# Patient Record
Sex: Male | Born: 1960 | Race: White | Hispanic: No | Marital: Single | Smoking: Former smoker
Health system: Southern US, Community
[De-identification: ages and names within clinical notes are randomized; demographics above are authoritative.]

## PROBLEM LIST (undated history)

## (undated) DIAGNOSIS — F32A Depression, unspecified: Secondary | ICD-10-CM

## (undated) DIAGNOSIS — E785 Hyperlipidemia, unspecified: Secondary | ICD-10-CM

## (undated) DIAGNOSIS — M1990 Unspecified osteoarthritis, unspecified site: Secondary | ICD-10-CM

## (undated) DIAGNOSIS — G4733 Obstructive sleep apnea (adult) (pediatric): Secondary | ICD-10-CM

## (undated) DIAGNOSIS — R51 Headache: Secondary | ICD-10-CM

## (undated) DIAGNOSIS — I10 Essential (primary) hypertension: Secondary | ICD-10-CM

## (undated) DIAGNOSIS — I639 Cerebral infarction, unspecified: Secondary | ICD-10-CM

## (undated) DIAGNOSIS — M109 Gout, unspecified: Secondary | ICD-10-CM

## (undated) DIAGNOSIS — K5900 Constipation, unspecified: Secondary | ICD-10-CM

## (undated) DIAGNOSIS — R519 Headache, unspecified: Secondary | ICD-10-CM

## (undated) DIAGNOSIS — E119 Type 2 diabetes mellitus without complications: Secondary | ICD-10-CM

## (undated) DIAGNOSIS — F329 Major depressive disorder, single episode, unspecified: Secondary | ICD-10-CM

## (undated) DIAGNOSIS — R0602 Shortness of breath: Secondary | ICD-10-CM

## (undated) HISTORY — DX: Unspecified osteoarthritis, unspecified site: M19.90

## (undated) HISTORY — DX: Major depressive disorder, single episode, unspecified: F32.9

## (undated) HISTORY — DX: Headache, unspecified: R51.9

## (undated) HISTORY — DX: Constipation, unspecified: K59.00

## (undated) HISTORY — DX: Type 2 diabetes mellitus without complications: E11.9

## (undated) HISTORY — DX: Shortness of breath: R06.02

## (undated) HISTORY — DX: Obstructive sleep apnea (adult) (pediatric): G47.33

## (undated) HISTORY — DX: Headache: R51

## (undated) HISTORY — DX: Hyperlipidemia, unspecified: E78.5

## (undated) HISTORY — DX: Depression, unspecified: F32.A

## (undated) HISTORY — DX: Cerebral infarction, unspecified: I63.9

## (undated) HISTORY — DX: Essential (primary) hypertension: I10

## (undated) HISTORY — PX: TONSILLECTOMY: SUR1361

---

## 2004-12-17 ENCOUNTER — Emergency Department: Payer: Self-pay | Admitting: Emergency Medicine

## 2005-09-11 HISTORY — PX: BACK SURGERY: SHX140

## 2007-09-08 ENCOUNTER — Other Ambulatory Visit: Payer: Self-pay

## 2007-09-09 ENCOUNTER — Inpatient Hospital Stay: Payer: Self-pay | Admitting: Internal Medicine

## 2010-06-29 ENCOUNTER — Emergency Department: Payer: Self-pay | Admitting: Emergency Medicine

## 2010-12-25 ENCOUNTER — Emergency Department: Payer: Self-pay | Admitting: Emergency Medicine

## 2012-04-24 ENCOUNTER — Emergency Department: Payer: Self-pay | Admitting: Emergency Medicine

## 2012-04-24 LAB — COMPREHENSIVE METABOLIC PANEL
Alkaline Phosphatase: 74 U/L (ref 50–136)
Calcium, Total: 8.9 mg/dL (ref 8.5–10.1)
Chloride: 103 mmol/L (ref 98–107)
Co2: 28 mmol/L (ref 21–32)
EGFR (African American): >60
EGFR (Non-African Amer.): >60
Potassium: 3.6 mmol/L (ref 3.5–5.1)
SGOT(AST): 39 U/L — ABNORMAL HIGH (ref 15–37)
SGPT (ALT): 54 U/L (ref 12–78)
Sodium: 138 mmol/L (ref 136–145)

## 2012-04-24 LAB — CBC
HCT: 38.2 % — ABNORMAL LOW (ref 40.0–52.0)
MCH: 30.8 pg (ref 26.0–34.0)
MCHC: 35.8 g/dL (ref 32.0–36.0)
MCV: 86 fL (ref 80–100)
Platelet: 216 10*3/uL (ref 150–440)
RDW: 13.8 % (ref 11.5–14.5)

## 2012-08-29 ENCOUNTER — Emergency Department: Payer: Self-pay | Admitting: Emergency Medicine

## 2012-08-31 ENCOUNTER — Emergency Department: Payer: Self-pay | Admitting: Emergency Medicine

## 2012-09-02 ENCOUNTER — Emergency Department: Payer: Self-pay | Admitting: Emergency Medicine

## 2013-01-05 ENCOUNTER — Emergency Department: Payer: Self-pay | Admitting: Internal Medicine

## 2013-01-05 LAB — CBC
HCT: 40.4 % (ref 40.0–52.0)
MCH: 29.4 pg (ref 26.0–34.0)
MCHC: 34.2 g/dL (ref 32.0–36.0)
Platelet: 213 10*3/uL (ref 150–440)
RBC: 4.69 10*6/uL (ref 4.40–5.90)

## 2013-01-05 LAB — BASIC METABOLIC PANEL
Anion Gap: 6 — ABNORMAL LOW (ref 7–16)
Calcium, Total: 9.1 mg/dL (ref 8.5–10.1)
Chloride: 105 mmol/L (ref 98–107)
Co2: 28 mmol/L (ref 21–32)
Creatinine: 1.08 mg/dL (ref 0.60–1.30)
EGFR (Non-African Amer.): >60
Glucose: 103 mg/dL — ABNORMAL HIGH (ref 65–99)
Osmolality: 280 (ref 275–301)
Sodium: 139 mmol/L (ref 136–145)

## 2013-01-05 LAB — CK TOTAL AND CKMB (NOT AT ARMC)
CK, Total: 399 U/L — ABNORMAL HIGH (ref 35–232)
CK-MB: 6.5 ng/mL — ABNORMAL HIGH (ref 0.5–3.6)

## 2013-01-05 LAB — TROPONIN I: Troponin-I: <0.02 ng/mL

## 2013-01-19 ENCOUNTER — Emergency Department: Payer: Self-pay | Admitting: Emergency Medicine

## 2013-01-19 LAB — CBC
HCT: 39.4 % — ABNORMAL LOW (ref 40.0–52.0)
MCH: 29.4 pg (ref 26.0–34.0)
MCHC: 35 g/dL (ref 32.0–36.0)
Platelet: 254 10*3/uL (ref 150–440)
RBC: 4.69 10*6/uL (ref 4.40–5.90)
RDW: 13.6 % (ref 11.5–14.5)
WBC: 9.8 10*3/uL (ref 3.8–10.6)

## 2013-01-19 LAB — COMPREHENSIVE METABOLIC PANEL
Albumin: 3.9 g/dL (ref 3.4–5.0)
Anion Gap: 5 — ABNORMAL LOW (ref 7–16)
BUN: 18 mg/dL (ref 7–18)
Bilirubin,Total: 0.8 mg/dL (ref 0.2–1.0)
Chloride: 102 mmol/L (ref 98–107)
Co2: 28 mmol/L (ref 21–32)
Creatinine: 0.92 mg/dL (ref 0.60–1.30)
EGFR (African American): >60
Osmolality: 274 (ref 275–301)
Potassium: 4 mmol/L (ref 3.5–5.1)
SGOT(AST): 42 U/L — ABNORMAL HIGH (ref 15–37)
SGPT (ALT): 35 U/L (ref 12–78)
Sodium: 135 mmol/L — ABNORMAL LOW (ref 136–145)
Total Protein: 8.6 g/dL — ABNORMAL HIGH (ref 6.4–8.2)

## 2013-01-19 LAB — URINALYSIS, COMPLETE
Bilirubin,UR: NEGATIVE
Blood: NEGATIVE
Glucose,UR: NEGATIVE mg/dL (ref 0–75)
Leukocyte Esterase: NEGATIVE
RBC,UR: 2 /HPF (ref 0–5)
Specific Gravity: 1.019 (ref 1.003–1.030)
Squamous Epithelial: <1

## 2013-03-26 ENCOUNTER — Emergency Department: Payer: Self-pay | Admitting: Emergency Medicine

## 2013-08-15 ENCOUNTER — Emergency Department: Payer: Self-pay | Admitting: Emergency Medicine

## 2013-08-15 LAB — COMPREHENSIVE METABOLIC PANEL
Alkaline Phosphatase: 84 U/L
Anion Gap: 4 — ABNORMAL LOW (ref 7–16)
Bilirubin,Total: 0.6 mg/dL (ref 0.2–1.0)
Creatinine: 1.07 mg/dL (ref 0.60–1.30)
EGFR (Non-African Amer.): >60
Osmolality: 281 (ref 275–301)
Potassium: 3.8 mmol/L (ref 3.5–5.1)
SGPT (ALT): 51 U/L (ref 12–78)
Total Protein: 7.9 g/dL (ref 6.4–8.2)

## 2013-08-15 LAB — CBC
HGB: 13.4 g/dL (ref 13.0–18.0)
MCH: 28.5 pg (ref 26.0–34.0)
MCHC: 33.4 g/dL (ref 32.0–36.0)
WBC: 10.3 10*3/uL (ref 3.8–10.6)

## 2013-08-28 ENCOUNTER — Emergency Department: Payer: Self-pay | Admitting: Emergency Medicine

## 2013-08-28 LAB — CBC WITH DIFFERENTIAL/PLATELET
Basophil #: 0.1 10*3/uL (ref 0.0–0.1)
Eosinophil #: 0.1 10*3/uL (ref 0.0–0.7)
Eosinophil %: 1.2 %
HCT: 43.8 % (ref 40.0–52.0)
HGB: 14.9 g/dL (ref 13.0–18.0)
Lymphocyte %: 29.3 %
MCH: 29 pg (ref 26.0–34.0)
MCHC: 33.9 g/dL (ref 32.0–36.0)
MCV: 85 fL (ref 80–100)
Neutrophil #: 6.2 10*3/uL (ref 1.4–6.5)
Platelet: 250 10*3/uL (ref 150–440)
RBC: 5.13 10*6/uL (ref 4.40–5.90)
WBC: 10.2 10*3/uL (ref 3.8–10.6)

## 2013-08-28 LAB — BASIC METABOLIC PANEL
BUN: 12 mg/dL (ref 7–18)
Calcium, Total: 9.3 mg/dL (ref 8.5–10.1)
Co2: 25 mmol/L (ref 21–32)
Creatinine: 1.19 mg/dL (ref 0.60–1.30)
EGFR (African American): >60
EGFR (Non-African Amer.): >60
Glucose: 269 mg/dL — ABNORMAL HIGH (ref 65–99)
Osmolality: 274 (ref 275–301)
Potassium: 3.8 mmol/L (ref 3.5–5.1)
Sodium: 132 mmol/L — ABNORMAL LOW (ref 136–145)

## 2013-10-30 ENCOUNTER — Emergency Department: Payer: Self-pay | Admitting: Emergency Medicine

## 2013-10-30 LAB — COMPREHENSIVE METABOLIC PANEL
Albumin: 3.2 g/dL — ABNORMAL LOW (ref 3.4–5.0)
Alkaline Phosphatase: 75 U/L
Anion Gap: 2 — ABNORMAL LOW (ref 7–16)
BILIRUBIN TOTAL: 0.8 mg/dL (ref 0.2–1.0)
BUN: 12 mg/dL (ref 7–18)
CALCIUM: 8.8 mg/dL (ref 8.5–10.1)
CO2: 27 mmol/L (ref 21–32)
CREATININE: 1.01 mg/dL (ref 0.60–1.30)
Chloride: 107 mmol/L (ref 98–107)
EGFR (African American): >60
EGFR (Non-African Amer.): >60
GLUCOSE: 139 mg/dL — AB (ref 65–99)
Osmolality: 274 (ref 275–301)
POTASSIUM: 3.9 mmol/L (ref 3.5–5.1)
SGOT(AST): 33 U/L (ref 15–37)
SGPT (ALT): 45 U/L (ref 12–78)
SODIUM: 136 mmol/L (ref 136–145)
Total Protein: 7.1 g/dL (ref 6.4–8.2)

## 2013-10-30 LAB — CBC
HCT: 39 % — ABNORMAL LOW (ref 40.0–52.0)
HGB: 13.6 g/dL (ref 13.0–18.0)
MCH: 29.9 pg (ref 26.0–34.0)
MCHC: 35 g/dL (ref 32.0–36.0)
MCV: 86 fL (ref 80–100)
Platelet: 203 10*3/uL (ref 150–440)
RBC: 4.56 10*6/uL (ref 4.40–5.90)
RDW: 14.1 % (ref 11.5–14.5)
WBC: 9.4 10*3/uL (ref 3.8–10.6)

## 2013-10-30 LAB — TROPONIN I: Troponin-I: <0.02 ng/mL

## 2014-02-05 ENCOUNTER — Emergency Department: Payer: Self-pay | Admitting: Emergency Medicine

## 2014-02-05 LAB — BASIC METABOLIC PANEL
ANION GAP: 3 — AB (ref 7–16)
BUN: 13 mg/dL (ref 7–18)
CHLORIDE: 103 mmol/L (ref 98–107)
CREATININE: 1.21 mg/dL (ref 0.60–1.30)
Calcium, Total: 9 mg/dL (ref 8.5–10.1)
Co2: 30 mmol/L (ref 21–32)
EGFR (African American): >60
Glucose: 304 mg/dL — ABNORMAL HIGH (ref 65–99)
OSMOLALITY: 283 (ref 275–301)
POTASSIUM: 3.8 mmol/L (ref 3.5–5.1)
Sodium: 136 mmol/L (ref 136–145)

## 2014-02-05 LAB — URINALYSIS, COMPLETE
BACTERIA: NONE SEEN
BILIRUBIN, UR: NEGATIVE
Blood: NEGATIVE
Glucose,UR: >=500 mg/dL (ref 0–75)
Ketone: NEGATIVE
Leukocyte Esterase: NEGATIVE
Nitrite: NEGATIVE
Ph: 6 (ref 4.5–8.0)
Protein: NEGATIVE
RBC,UR: <1 /HPF (ref 0–5)
Specific Gravity: 1.021 (ref 1.003–1.030)
WBC UR: <1 /HPF (ref 0–5)

## 2014-02-05 LAB — TROPONIN I

## 2014-02-05 LAB — CBC WITH DIFFERENTIAL/PLATELET
BASOS ABS: 0.1 10*3/uL (ref 0.0–0.1)
BASOS PCT: 1.1 %
Eosinophil #: 0.2 10*3/uL (ref 0.0–0.7)
Eosinophil %: 3.3 %
HCT: 43.7 % (ref 40.0–52.0)
HGB: 14.8 g/dL (ref 13.0–18.0)
Lymphocyte #: 2.4 10*3/uL (ref 1.0–3.6)
Lymphocyte %: 33.3 %
MCH: 29.8 pg (ref 26.0–34.0)
MCHC: 33.9 g/dL (ref 32.0–36.0)
MCV: 88 fL (ref 80–100)
MONO ABS: 0.7 x10 3/mm (ref 0.2–1.0)
Monocyte %: 10.1 %
NEUTROS PCT: 52.2 %
Neutrophil #: 3.7 10*3/uL (ref 1.4–6.5)
PLATELETS: 198 10*3/uL (ref 150–440)
RBC: 4.97 10*6/uL (ref 4.40–5.90)
RDW: 13.7 % (ref 11.5–14.5)
WBC: 7.2 10*3/uL (ref 3.8–10.6)

## 2014-06-15 ENCOUNTER — Ambulatory Visit: Payer: Self-pay | Admitting: Family Medicine

## 2014-07-12 ENCOUNTER — Ambulatory Visit: Payer: Self-pay | Admitting: Family Medicine

## 2014-08-20 ENCOUNTER — Emergency Department: Payer: Self-pay | Admitting: Emergency Medicine

## 2014-08-20 LAB — BASIC METABOLIC PANEL
Anion Gap: 8 (ref 7–16)
BUN: 14 mg/dL (ref 7–18)
CALCIUM: 8.7 mg/dL (ref 8.5–10.1)
Chloride: 100 mmol/L (ref 98–107)
Co2: 29 mmol/L (ref 21–32)
Creatinine: 1.13 mg/dL (ref 0.60–1.30)
EGFR (African American): >60
EGFR (Non-African Amer.): >60
Glucose: 270 mg/dL — ABNORMAL HIGH (ref 65–99)
Osmolality: 284 (ref 275–301)
POTASSIUM: 3.8 mmol/L (ref 3.5–5.1)
SODIUM: 137 mmol/L (ref 136–145)

## 2014-08-20 LAB — CBC
HCT: 41.2 % (ref 40.0–52.0)
HGB: 13.8 g/dL (ref 13.0–18.0)
MCH: 29.6 pg (ref 26.0–34.0)
MCHC: 33.5 g/dL (ref 32.0–36.0)
MCV: 89 fL (ref 80–100)
Platelet: 215 10*3/uL (ref 150–440)
RBC: 4.65 10*6/uL (ref 4.40–5.90)
RDW: 13.7 % (ref 11.5–14.5)
WBC: 10.5 10*3/uL (ref 3.8–10.6)

## 2014-08-20 LAB — TROPONIN I: Troponin-I: <0.02 ng/mL

## 2014-12-14 ENCOUNTER — Encounter: Payer: Self-pay | Admitting: Family Medicine

## 2014-12-15 ENCOUNTER — Encounter: Payer: Self-pay | Admitting: Family Medicine

## 2014-12-15 DIAGNOSIS — E1149 Type 2 diabetes mellitus with other diabetic neurological complication: Secondary | ICD-10-CM | POA: Insufficient documentation

## 2014-12-15 DIAGNOSIS — E1165 Type 2 diabetes mellitus with hyperglycemia: Secondary | ICD-10-CM | POA: Insufficient documentation

## 2014-12-15 DIAGNOSIS — E1159 Type 2 diabetes mellitus with other circulatory complications: Secondary | ICD-10-CM | POA: Insufficient documentation

## 2014-12-15 DIAGNOSIS — I152 Hypertension secondary to endocrine disorders: Secondary | ICD-10-CM | POA: Insufficient documentation

## 2014-12-15 DIAGNOSIS — R0681 Apnea, not elsewhere classified: Secondary | ICD-10-CM | POA: Insufficient documentation

## 2014-12-15 DIAGNOSIS — E118 Type 2 diabetes mellitus with unspecified complications: Secondary | ICD-10-CM

## 2014-12-15 DIAGNOSIS — I10 Essential (primary) hypertension: Secondary | ICD-10-CM | POA: Insufficient documentation

## 2015-01-06 ENCOUNTER — Emergency Department
Admit: 2015-01-06 | Disposition: A | Discharge status: Home / Self Care | Payer: Self-pay | Admitting: Emergency Medicine

## 2015-01-06 LAB — CBC
HCT: 41.6 % (ref 40.0–52.0)
HGB: 13.9 g/dL (ref 13.0–18.0)
MCH: 29.2 pg (ref 26.0–34.0)
MCHC: 33.4 g/dL (ref 32.0–36.0)
MCV: 88 fL (ref 80–100)
Platelet: 226 10*3/uL (ref 150–440)
RBC: 4.75 10*6/uL (ref 4.40–5.90)
RDW: 13.9 % (ref 11.5–14.5)
WBC: 9 10*3/uL (ref 3.8–10.6)

## 2015-01-06 LAB — COMPREHENSIVE METABOLIC PANEL
ALT: 35 U/L
Albumin: 4.3 g/dL
Alkaline Phosphatase: 55 U/L
Anion Gap: 7 (ref 7–16)
BILIRUBIN TOTAL: 0.8 mg/dL
BUN: 20 mg/dL
CREATININE: 1.09 mg/dL
Calcium, Total: 9.4 mg/dL
Chloride: 104 mmol/L
Co2: 30 mmol/L
EGFR (African American): >60
EGFR (Non-African Amer.): >60
GLUCOSE: 115 mg/dL — AB
POTASSIUM: 4 mmol/L
SGOT(AST): 30 U/L
Sodium: 141 mmol/L
TOTAL PROTEIN: 7.7 g/dL

## 2015-01-06 LAB — TROPONIN I: Troponin-I: <0.03 ng/mL

## 2015-01-06 LAB — PROTIME-INR
INR: 1
Prothrombin Time: 13.4 secs

## 2015-01-06 LAB — ETHANOL: Ethanol: <5 mg/dL

## 2015-02-18 ENCOUNTER — Encounter (INDEPENDENT_AMBULATORY_CARE_PROVIDER_SITE_OTHER): Payer: Self-pay

## 2015-02-18 ENCOUNTER — Encounter: Payer: Self-pay | Admitting: Family Medicine

## 2015-02-18 ENCOUNTER — Ambulatory Visit (INDEPENDENT_AMBULATORY_CARE_PROVIDER_SITE_OTHER): Payer: Self-pay | Admitting: Family Medicine

## 2015-02-18 VITALS — BP 120/80 | HR 90 | Resp 16 | Ht 72.0 in | Wt 330.3 lb

## 2015-02-18 DIAGNOSIS — E785 Hyperlipidemia, unspecified: Secondary | ICD-10-CM

## 2015-02-18 DIAGNOSIS — E668 Other obesity: Secondary | ICD-10-CM

## 2015-02-18 DIAGNOSIS — E1169 Type 2 diabetes mellitus with other specified complication: Secondary | ICD-10-CM

## 2015-02-18 DIAGNOSIS — E1143 Type 2 diabetes mellitus with diabetic autonomic (poly)neuropathy: Secondary | ICD-10-CM | POA: Insufficient documentation

## 2015-02-18 DIAGNOSIS — I10 Essential (primary) hypertension: Secondary | ICD-10-CM

## 2015-02-18 DIAGNOSIS — G629 Polyneuropathy, unspecified: Secondary | ICD-10-CM

## 2015-02-18 DIAGNOSIS — E669 Obesity, unspecified: Secondary | ICD-10-CM

## 2015-02-18 DIAGNOSIS — E118 Type 2 diabetes mellitus with unspecified complications: Secondary | ICD-10-CM

## 2015-02-18 MED ORDER — LISINOPRIL-HYDROCHLOROTHIAZIDE 10-12.5 MG PO TABS
1.0000 | ORAL_TABLET | Freq: Every day | ORAL | Status: DC
Start: 2015-02-18 — End: 2015-08-09

## 2015-02-18 MED ORDER — METFORMIN HCL 1000 MG PO TABS: 1000.0000 mg | ORAL_TABLET | Freq: Two times a day (BID) | ORAL | Status: DC

## 2015-02-18 MED ORDER — PRAVASTATIN SODIUM 40 MG PO TABS: 40.0000 mg | ORAL_TABLET | Freq: Every day | ORAL | Status: DC

## 2015-02-18 NOTE — Assessment & Plan Note (Deleted)
Patient will improve his dietary lifestyle factors for optimal control of diabetes. He has stopped taking gabapentin because it was not effective. We will discuss starting patient on a different agent at his next visit.

## 2015-02-18 NOTE — Progress Notes (Signed)
Name: Jamie Finley   MRN: [id number]    DOB: 11-27-1960   Date:02/18/2015       Progress Note  Subjective  Chief Complaint  Chief Complaint  Patient presents with  . Diabetes    3 month follow up  . Hyperlipidemia  . Hypertension    Diabetes He presents for his follow-up diabetic visit. He has type 2 diabetes mellitus. Pertinent negatives for hypoglycemia include no headaches. Associated symptoms include visual change. Pertinent negatives for diabetes include no blurred vision, no chest pain, no fatigue, no foot paresthesias, no polydipsia, no polyuria, no weakness and no weight loss. Hypoglycemia complications include nocturnal hypoglycemia. Symptoms are stable. Pertinent negatives for diabetic complications include no CVA. Risk factors for coronary artery disease include male sex, obesity and dyslipidemia. Current diabetic treatment includes oral agent (monotherapy). His weight is stable. He is following a generally healthy diet. When asked about meal planning, he reported none. He rarely participates in exercise. His breakfast blood glucose is taken between 7-8 am. His breakfast blood glucose range is generally 110-130 mg/dl. Eye exam is current.  Hyperlipidemia This is a chronic problem. Recent lipid tests were reviewed and are normal. Exacerbating diseases include diabetes and obesity. Pertinent negatives include no chest pain, focal sensory loss, myalgias or shortness of breath. Current antihyperlipidemic treatment includes statins. The current treatment provides significant improvement of lipids.  Hypertension This is a chronic problem. The problem is controlled. Pertinent negatives include no blurred vision, chest pain, headaches, malaise/fatigue or shortness of breath. Past treatments include ACE inhibitors and diuretics. The current treatment provides significant improvement. Compliance problems include diet.  There is no history of angina, kidney disease, CAD/MI or CVA.      Past  Medical History  Diagnosis Date  . Diabetes mellitus without complication   . Hypertension   . Hyperlipidemia    Past Surgical History  Procedure Laterality Date  . Back surgery  2007    [facility]   Family History  Problem Relation Age of Onset  . Cancer Mother     face and jaw  . Cancer Father     kidney     History  Substance Use Topics  . Smoking status: Former Smoker    Quit date: 09/11/1989  . Smokeless tobacco: Never Used  . Alcohol Use: No     Current outpatient prescriptions:  .  aspirin 81 MG tablet, Take 81 mg by mouth daily., Disp: , Rfl:  .  lisinopril-hydrochlorothiazide (PRINZIDE,ZESTORETIC) 10-12.5 MG per tablet, Take 1 tablet by mouth daily. , Disp: , Rfl:  .  pravastatin (PRAVACHOL) 40 MG tablet, Take 40 mg by mouth daily. , Disp: , Rfl:  .  [DISCONTINUED] metFORMIN (GLUCOPHAGE) 1000 MG tablet, Take 500 mg by mouth 2 (two) times daily with a meal. , Disp: , Rfl:  .  gabapentin (NEURONTIN) 300 MG capsule, Take by mouth., Disp: , Rfl:  .  glipiZIDE (GLUCOTROL XL) 5 MG 24 hr tablet, Take by mouth., Disp: , Rfl:   No Known Allergies  Review of Systems  Constitutional: Negative for weight loss, malaise/fatigue and fatigue.  Eyes: Negative for blurred vision.  Respiratory: Negative for shortness of breath.   Cardiovascular: Negative for chest pain.  Musculoskeletal: Negative for myalgias.  Neurological: Negative for weakness and headaches.  Endo/Heme/Allergies: Negative for polydipsia.      Objective  Filed Vitals:   02/18/15 0822  BP: 120/80  Pulse: 90  Resp: 16  Height: 6' (1.829 m)  Weight: 330 lb 4.8  oz (149.823 kg)  SpO2: 96%     Physical Exam  Constitutional: He is well-developed, well-nourished, and in no distress.  HENT:  Head: Normocephalic and atraumatic.  Cardiovascular: Normal rate.   Pulmonary/Chest: Effort normal.  Abdominal: Soft. Bowel sounds are normal.  Neurological: He is alert.  Skin: Skin is warm and dry.   Psychiatric: Affect normal.  Nursing note and vitals reviewed.       Assessment & Plan   There are no diagnoses linked to this encounter.   Gwendoline Judy [facility] 02/18/2015 8:41 AM  1. Essential hypertension  - lisinopril-hydrochlorothiazide (PRINZIDE,ZESTORETIC) 10-12.5 MG per tablet; Take 1 tablet by mouth daily.  Dispense: 90 tablet; Refill: 0  2. Diabetes mellitus type 2, controlled, with complications And has stopped taking glipizide because of hypoglycemic episodes. We will review A1c and consider starting patient on a DPP 4 inhibitor. For now, he is going to continue on metformin. - metFORMIN (GLUCOPHAGE) 1000 MG tablet; Take 1 tablet (1,000 mg total) by mouth 2 (two) times daily with a meal.  Dispense: 180 tablet; Refill: 3 - HgB A1c  3. Hyperlipidemia associated with type 2 diabetes mellitus  - pravastatin (PRAVACHOL) 40 MG tablet; Take 1 tablet (40 mg total) by mouth daily.  Dispense: 90 tablet; Refill: 0 - Lipid Profile - Comprehensive metabolic panel  4. Extreme obesity Patient has been educated on dietary and lifestyle changes for weight loss. He is not interested in a nutritionist referral at this time. Follow-up in 3 months.  5. Peripheral neuropathy  Patient will improve his dietary lifestyle factors for optimal control of diabetes. He has stopped taking gabapentin because it was not effective. We will discuss starting patient on a different agent at his next visit.

## 2015-03-25 ENCOUNTER — Encounter: Payer: Self-pay | Admitting: Family Medicine

## 2015-05-20 ENCOUNTER — Ambulatory Visit: Payer: Self-pay | Admitting: Family Medicine

## 2015-08-04 ENCOUNTER — Telehealth: Payer: Self-pay | Admitting: Family Medicine

## 2015-08-04 NOTE — Telephone Encounter (Signed)
Patient needs an office visit appointment for blood pressure follow-up prior to refills.

## 2015-08-04 NOTE — Telephone Encounter (Signed)
NEEDS REFILL ON BP MEDS. PHARM IS [facility] ON [address]

## 2015-08-09 ENCOUNTER — Encounter: Payer: Self-pay | Admitting: Family Medicine

## 2015-08-09 ENCOUNTER — Ambulatory Visit (INDEPENDENT_AMBULATORY_CARE_PROVIDER_SITE_OTHER): Payer: Self-pay | Admitting: Family Medicine

## 2015-08-09 VITALS — BP 120/77 | HR 87 | Temp 98.7°F | Resp 18 | Ht 72.0 in | Wt 330.8 lb

## 2015-08-09 DIAGNOSIS — I10 Essential (primary) hypertension: Secondary | ICD-10-CM

## 2015-08-09 DIAGNOSIS — E118 Type 2 diabetes mellitus with unspecified complications: Secondary | ICD-10-CM

## 2015-08-09 DIAGNOSIS — E785 Hyperlipidemia, unspecified: Secondary | ICD-10-CM

## 2015-08-09 DIAGNOSIS — E1169 Type 2 diabetes mellitus with other specified complication: Secondary | ICD-10-CM

## 2015-08-09 MED ORDER — LISINOPRIL-HYDROCHLOROTHIAZIDE 10-12.5 MG PO TABS: 1.0000 | ORAL_TABLET | Freq: Every day | ORAL | Status: DC

## 2015-08-09 NOTE — Progress Notes (Signed)
Name: Jamie Finley   MRN: [id number]    DOB: 01/26/1961   Date:08/09/2015       Progress Note  Subjective  Chief Complaint  Chief Complaint  Patient presents with  . Medication Refill    lisinopril 10-12.5 mg / pravastatin 40mg    . Diabetes  . Hyperlipidemia    Diabetes He presents for his follow-up diabetic visit. He has type 2 diabetes mellitus. His disease course has been stable. There are no hypoglycemic associated symptoms. Pertinent negatives for hypoglycemia include no headaches. Pertinent negatives for diabetes include no blurred vision and no chest pain. Pertinent negatives for diabetic complications include no CVA. Current diabetic treatment includes oral agent (monotherapy). His breakfast blood glucose range is generally 140-180 mg/dl.  Hyperlipidemia This is a chronic problem. Exacerbating diseases include diabetes and obesity. Pertinent negatives include no chest pain, leg pain or shortness of breath. Current antihyperlipidemic treatment includes statins.  Hypertension This is a chronic problem. The problem is unchanged. The problem is controlled. Pertinent negatives include no blurred vision, chest pain, headaches, palpitations or shortness of breath. Past treatments include ACE inhibitors and diuretics. There is no history of kidney disease, CAD/MI or CVA.   Past Medical History  Diagnosis Date  . Diabetes mellitus without complication ([place])   . Hypertension   . Hyperlipidemia     Past Surgical History  Procedure Laterality Date  . Back surgery  2007    [facility]    Family History  Problem Relation Age of Onset  . Cancer Mother     face and jaw  . Cancer Father     kidney    Social History   Social History  . Marital Status: Single    Spouse Name: N/A  . Number of Children: N/A  . Years of Education: N/A   Occupational History  . Not on file.   Social History Main Topics  . Smoking status: Former Smoker    Quit date: 09/11/1989  . Smokeless  tobacco: Never Used  . Alcohol Use: No  . Drug Use: No  . Sexual Activity: Not on file   Other Topics Concern  . Not on file   Social History Narrative     Current outpatient prescriptions:  .  aspirin 81 MG tablet, Take 81 mg by mouth daily., Disp: , Rfl:  .  lisinopril-hydrochlorothiazide (PRINZIDE,ZESTORETIC) 10-12.5 MG per tablet, Take 1 tablet by mouth daily., Disp: 90 tablet, Rfl: 0 .  metFORMIN (GLUCOPHAGE) 1000 MG tablet, Take 1 tablet (1,000 mg total) by mouth 2 (two) times daily with a meal., Disp: 180 tablet, Rfl: 3 .  pravastatin (PRAVACHOL) 40 MG tablet, Take 1 tablet (40 mg total) by mouth daily. (Patient not taking: Reported on 08/09/2015), Disp: 90 tablet, Rfl: 0  No Known Allergies   Review of Systems  Eyes: Negative for blurred vision.  Respiratory: Negative for shortness of breath.   Cardiovascular: Negative for chest pain and palpitations.  Neurological: Negative for headaches.    Objective  Filed Vitals:   08/09/15 0820  BP: 120/77  Pulse: 87  Temp: 98.7 F (37.1 C)  TempSrc: Oral  Resp: 18  Height: 6' (1.829 m)  Weight: 330 lb 12.8 oz (150.05 kg)  SpO2: 96%    Physical Exam  Constitutional: He is oriented to person, place, and time and well-developed, well-nourished, and in no distress.  HENT:  Head: Normocephalic and atraumatic.  Cardiovascular: Normal rate and regular rhythm.   Pulmonary/Chest: Effort normal and breath sounds normal.  Musculoskeletal: He exhibits no edema.  Neurological: He is alert and oriented to person, place, and time.  Nursing note and vitals reviewed.    Assessment & Plan  1. Essential hypertension  - lisinopril-hydrochlorothiazide (PRINZIDE,ZESTORETIC) 10-12.5 MG tablet; Take 1 tablet by mouth daily.  Dispense: 90 tablet; Refill: 0  2. Controlled type 2 diabetes mellitus with complication, without long-term current use of insulin ([place]) We'll obtain A1c in office.  3. Hyperlipidemia associated with type 2  diabetes mellitus (H[facility]) Continue Pravastatin 40 mg at bedtime and repeat FLP in 3 months.   Jawanda Passey [facility] 08/09/2015 8:27 AM

## 2015-11-14 ENCOUNTER — Emergency Department
Admission: EM | Admit: 2015-11-14 | Discharge: 2015-11-15 | Disposition: A | Discharge status: Home / Self Care | Payer: Self-pay | Attending: Emergency Medicine | Admitting: Emergency Medicine

## 2015-11-14 ENCOUNTER — Emergency Department: Payer: Self-pay

## 2015-11-14 ENCOUNTER — Encounter: Payer: Self-pay | Admitting: *Deleted

## 2015-11-14 DIAGNOSIS — Z7982 Long term (current) use of aspirin: Secondary | ICD-10-CM | POA: Insufficient documentation

## 2015-11-14 DIAGNOSIS — J40 Bronchitis, not specified as acute or chronic: Secondary | ICD-10-CM

## 2015-11-14 DIAGNOSIS — E1169 Type 2 diabetes mellitus with other specified complication: Secondary | ICD-10-CM | POA: Insufficient documentation

## 2015-11-14 DIAGNOSIS — Z7984 Long term (current) use of oral hypoglycemic drugs: Secondary | ICD-10-CM | POA: Insufficient documentation

## 2015-11-14 DIAGNOSIS — E785 Hyperlipidemia, unspecified: Secondary | ICD-10-CM | POA: Insufficient documentation

## 2015-11-14 DIAGNOSIS — Z79899 Other long term (current) drug therapy: Secondary | ICD-10-CM | POA: Insufficient documentation

## 2015-11-14 DIAGNOSIS — Z87891 Personal history of nicotine dependence: Secondary | ICD-10-CM | POA: Insufficient documentation

## 2015-11-14 DIAGNOSIS — I10 Essential (primary) hypertension: Secondary | ICD-10-CM | POA: Insufficient documentation

## 2015-11-14 NOTE — ED Notes (Addendum)
Pt reports he has a cough for 5 days.   States coughing up clear/white phlegm.  Chills at night.   No sob.  No chest pain. Nonsmoker.  Pt alert.

## 2015-11-15 MED ORDER — PREDNISONE 20 MG PO TABS: 60.0000 mg | ORAL_TABLET | Freq: Every day | ORAL | Status: DC

## 2015-11-15 MED ORDER — IBUPROFEN 800 MG PO TABS
800.0000 mg | ORAL_TABLET | Freq: Once | ORAL | Status: AC
  Administered 2015-11-15: 800 mg via ORAL
  Filled 2015-11-15: qty 1

## 2015-11-15 MED ORDER — LISINOPRIL-HYDROCHLOROTHIAZIDE 10-12.5 MG PO TABS: 1.0000 | ORAL_TABLET | Freq: Every day | ORAL | Status: DC

## 2015-11-15 MED ORDER — IPRATROPIUM-ALBUTEROL 0.5-2.5 (3) MG/3ML IN SOLN
3.0000 mL | Freq: Once | RESPIRATORY_TRACT | Status: AC
  Administered 2015-11-15: 3 mL via RESPIRATORY_TRACT
  Filled 2015-11-15: qty 3

## 2015-11-15 MED ORDER — ALBUTEROL SULFATE HFA 108 (90 BASE) MCG/ACT IN AERS: 2.0000 | INHALATION_SPRAY | Freq: Four times a day (QID) | RESPIRATORY_TRACT | Status: DC | PRN

## 2015-11-15 MED ORDER — BENZONATATE 100 MG PO CAPS
100.0000 mg | ORAL_CAPSULE | Freq: Once | ORAL | Status: AC
  Administered 2015-11-15: 100 mg via ORAL
  Filled 2015-11-15: qty 1

## 2015-11-15 MED ORDER — PREDNISONE 20 MG PO TABS
60.0000 mg | ORAL_TABLET | Freq: Once | ORAL | Status: AC
  Administered 2015-11-15: 60 mg via ORAL
  Filled 2015-11-15: qty 3

## 2015-11-15 MED ORDER — METFORMIN HCL 1000 MG PO TABS: 1000.0000 mg | ORAL_TABLET | Freq: Two times a day (BID) | ORAL | Status: DC

## 2015-11-15 NOTE — Discharge Instructions (Signed)
Upper Respiratory Infection, Adult Most upper respiratory infections (URIs) are a viral infection of the air passages leading to the lungs. A URI affects the nose, throat, and upper air passages. The most common type of URI is nasopharyngitis and is typically referred to as "the common cold." URIs run their course and usually go away on their own. Most of the time, a URI does not require medical attention, but sometimes a bacterial infection in the upper airways can follow a viral infection. This is called a secondary infection. Sinus and middle ear infections are common types of secondary upper respiratory infections. Bacterial pneumonia can also complicate a URI. A URI can worsen asthma and chronic obstructive pulmonary disease (COPD). Sometimes, these complications can require emergency medical care and may be life threatening.  CAUSES Almost all URIs are caused by viruses. A virus is a type of germ and can spread from one person to another.  RISKS FACTORS You may be at risk for a URI if:   You smoke.   You have chronic heart or lung disease.  You have a weakened defense (immune) system.   You are very young or very old.   You have nasal allergies or asthma.  You work in crowded or poorly ventilated areas.  You work in health care facilities or schools. SIGNS AND SYMPTOMS  Symptoms typically develop 2-3 days after you come in contact with a cold virus. Most viral URIs last 7-10 days. However, viral URIs from the influenza virus (flu virus) can last 14-18 days and are typically more severe. Symptoms may include:   Runny or stuffy (congested) nose.   Sneezing.   Cough.   Sore throat.   Headache.   Fatigue.   Fever.   Loss of appetite.   Pain in your forehead, behind your eyes, and over your cheekbones (sinus pain).  Muscle aches.  DIAGNOSIS  Your health care provider may diagnose a URI by:  Physical exam.  Tests to check that your symptoms are not due to  another condition such as:  Strep throat.  Sinusitis.  Pneumonia.  Asthma. TREATMENT  A URI goes away on its own with time. It cannot be cured with medicines, but medicines may be prescribed or recommended to relieve symptoms. Medicines may help:  Reduce your fever.  Reduce your cough.  Relieve nasal congestion. HOME CARE INSTRUCTIONS   Take medicines only as directed by your health care provider.   Gargle warm saltwater or take cough drops to comfort your throat as directed by your health care provider.  Use a warm mist humidifier or inhale steam from a shower to increase air moisture. This may make it easier to breathe.  Drink enough fluid to keep your urine clear or pale yellow.   Eat soups and other clear broths and maintain good nutrition.   Rest as needed.   Return to work when your temperature has returned to normal or as your health care provider advises. You may need to stay home longer to avoid infecting others. You can also use a face mask and careful hand washing to prevent spread of the virus.  Increase the usage of your inhaler if you have asthma.   Do not use any tobacco products, including cigarettes, chewing tobacco, or electronic cigarettes. If you need help quitting, ask your health care provider. PREVENTION  The best way to protect yourself from getting a cold is to practice good hygiene.   Avoid oral or hand contact with people with cold   symptoms.   Wash your hands often if contact occurs.  There is no clear evidence that vitamin C, vitamin E, echinacea, or exercise reduces the chance of developing a cold. However, it is always recommended to get plenty of rest, exercise, and practice good nutrition.  SEEK MEDICAL CARE IF:   You are getting worse rather than better.   Your symptoms are not controlled by medicine.   You have chills.  You have worsening shortness of breath.  You have brown or red mucus.  You have yellow or brown nasal  discharge.  You have pain in your face, especially when you bend forward.  You have a fever.  You have swollen neck glands.  You have pain while swallowing.  You have white areas in the back of your throat. SEEK IMMEDIATE MEDICAL CARE IF:   You have severe or persistent:  Headache.  Ear pain.  Sinus pain.  Chest pain.  You have chronic lung disease and any of the following:  Wheezing.  Prolonged cough.  Coughing up blood.  A change in your usual mucus.  You have a stiff neck.  You have changes in your:  Vision.  Hearing.  Thinking.  Mood. MAKE SURE YOU:   Understand these instructions.  Will watch your condition.  Will get help right away if you are not doing well or get worse.   This information is not intended to replace advice given to you by your health care provider. Make sure you discuss any questions you have with your health care provider.   Document Released: 02/21/2001 Document Revised: 01/12/2015 Document Reviewed: 12/03/2013 Elsevier Interactive Patient Education 2016 [employer] Inc[employer]  

## 2015-11-15 NOTE — ED Provider Notes (Signed)
[facility] Emergency Department Provider Note  ____________________________________________  Time seen: Approximately 0050 AM  I have reviewed the triage vital signs and the nursing notes.   HISTORY  Chief Complaint Cough    HPI Jamie Finley is a 55 y.o. male who comes into the hospital today with congestion. The patient reports that he has been unable to eat and sleep. He feels like it is in his chest and is concerned he may have pneumonia. The patient has no fevers but reports that the symptoms started on Wednesday night. He's been taking vitamins and tests and but nothing has been working. The patient has a primary care physician but has not seen him due to lack of insurance. The patient denies any headache or blurred vision. He's had some shortness of breath with cough is productive of white sputum. He did have some nausea this evening but has not vomited. He also reports he has not been taking his diabetes medications. He came in to get evaluated and checked out for the symptoms. He denies any chest pain.   Past Medical History  Diagnosis Date  . Diabetes mellitus without complication ([place])   . Hypertension   . Hyperlipidemia     Patient Active Problem List   Diagnosis Date Noted  . Hyperlipidemia associated with type 2 diabetes mellitus ([place]) 02/18/2015  . Peripheral neuropathy ([place]) 02/18/2015  . Diabetes mellitus type 2, controlled, with complications ([place]) 99991111  . Diabetes ([place]) 12/15/2014  . Breathlessness on exertion 12/15/2014  . BP (high blood pressure) 12/15/2014  . Extreme obesity ([place]) 12/15/2014    Past Surgical History  Procedure Laterality Date  . Back surgery  2007    [facility]    Current Outpatient Rx  Name  Route  Sig  Dispense  Refill  . albuterol (PROVENTIL HFA;VENTOLIN HFA) 108 (90 Base) MCG/ACT inhaler   Inhalation   Inhale 2 puffs into the lungs every 6 (six) hours as needed.   1 Inhaler   0   . aspirin 81 MG  tablet   Oral   Take 81 mg by mouth daily.         Marland Kitchen lisinopril-hydrochlorothiazide (PRINZIDE,ZESTORETIC) 10-12.5 MG tablet   Oral   Take 1 tablet by mouth daily.   90 tablet   0   . lisinopril-hydrochlorothiazide (PRINZIDE,ZESTORETIC) 10-12.5 MG tablet   Oral   Take 1 tablet by mouth daily.   30 tablet   0   . metFORMIN (GLUCOPHAGE) 1000 MG tablet   Oral   Take 1 tablet (1,000 mg total) by mouth 2 (two) times daily with a meal.   180 tablet   3   . metFORMIN (GLUCOPHAGE) 1000 MG tablet   Oral   Take 1 tablet (1,000 mg total) by mouth 2 (two) times daily with a meal.   60 tablet   0   . pravastatin (PRAVACHOL) 40 MG tablet   Oral   Take 1 tablet (40 mg total) by mouth daily. Patient not taking: Reported on 08/09/2015   90 tablet   0   . predniSONE (DELTASONE) 20 MG tablet   Oral   Take 3 tablets (60 mg total) by mouth daily.   12 tablet   0     Allergies Review of patient's allergies indicates no known allergies.  Family History  Problem Relation Age of Onset  . Cancer Mother     face and jaw  . Cancer Father     kidney    Social  History Social History  Substance Use Topics  . Smoking status: Former Smoker    Quit date: 09/11/1989  . Smokeless tobacco: Never Used  . Alcohol Use: No    Review of Systems Constitutional: No fever/chills Eyes: No visual changes. ENT: No sore throat. Cardiovascular: Denies chest pain. Respiratory: Cough and shortness of breath. Gastrointestinal:  nausea, no vomiting.  No diarrhea.  No constipation. Genitourinary: Negative for dysuria. Musculoskeletal: Negative for back pain. Skin: Negative for rash. Neurological: Negative for headaches, focal weakness or numbness.  10-point ROS otherwise negative.  ____________________________________________   PHYSICAL EXAM:  VITAL SIGNS: ED Triage Vitals  Enc Vitals Group     BP 11/14/15 2218 115/57 mmHg     Pulse Rate 11/14/15 2218 90     Resp 11/14/15 2218 22      Temp 11/14/15 2218 98.2 F (36.8 C)     Temp Source 11/14/15 2218 Oral     SpO2 11/14/15 2218 95 %     Weight 11/14/15 2218 330 lb (149.687 kg)     Height 11/14/15 2218 6' (1.829 m)     Head Cir --      Peak Flow --      Pain Score 11/14/15 2220 0     Pain Loc --      Pain Edu? --      Excl. in [place]? --     Constitutional: Alert and oriented. Well appearing and in mild distress. Eyes: Conjunctivae are normal. PERRL. EOMI. Head: Atraumatic. Nose: No congestion/rhinnorhea. Mouth/Throat: Mucous membranes are moist.  Oropharynx non-erythematous. Cardiovascular: Normal rate, regular rhythm. Grossly normal heart sounds.  Good peripheral circulation. Respiratory: Normal respiratory effort.  No retractions. Mildly diminished breath sounds throughout with some mild wheezing. Gastrointestinal: Soft and nontender. No distention. Positive bowel sounds Musculoskeletal: No lower extremity tenderness nor edema.  Neurologic:  Normal speech and language.  Skin:  Skin is warm, dry and intact.  Psychiatric: Mood and affect are normal.   ____________________________________________   LABS (all labs ordered are listed, but only abnormal results are displayed)  Labs Reviewed - No data to display ____________________________________________  EKG  None ____________________________________________  RADIOLOGY  Chest x-ray: No active cardiopulmonary disease ____________________________________________   PROCEDURES  Procedure(s) performed: None  Critical Care performed: No  ____________________________________________   INITIAL IMPRESSION / ASSESSMENT AND PLAN / ED COURSE  Pertinent labs & imaging results that were available during my care of the patient were reviewed by me and considered in my medical decision making (see chart for details).  This is a 55 year old male who comes into the hospital today with some congestion. His chest x-ray does not show pneumonia. I did give the  patient a dose of prednisone as well as a breathing treatment which she reports that help his symptoms. The patient slept in the emergency department without difficulty for quite some time. He also received a dose of benzonatate. The patient after my initial discharge discussion reported that he did not have a way to get his medications and did give him a prescription for his blood pressure and his diabetes medications. He will be discharged home to follow-up with his primary care physician. ____________________________________________   FINAL CLINICAL IMPRESSION(S) / ED DIAGNOSES  Final diagnoses:  Bronchitis      Loney Hering, MD 11/15/15 [phone]

## 2015-11-15 NOTE — ED Notes (Signed)
Pt reports feeling better after medications. Pt resp even and unlabored with decreased wheezing noted at this time.

## 2015-11-19 ENCOUNTER — Ambulatory Visit: Payer: Self-pay

## 2015-12-16 ENCOUNTER — Encounter: Payer: Self-pay | Admitting: Pharmacist

## 2015-12-21 ENCOUNTER — Other Ambulatory Visit: Payer: Self-pay | Admitting: Family Medicine

## 2015-12-21 NOTE — Telephone Encounter (Signed)
Medication refill has been refused due to patient needs to schedule a medication refill appointment he has not been seen in office by Dr. Manuella Ghazi since 08/09/2015

## 2016-01-15 ENCOUNTER — Emergency Department: Payer: Self-pay

## 2016-01-15 ENCOUNTER — Encounter: Payer: Self-pay | Admitting: Emergency Medicine

## 2016-01-15 ENCOUNTER — Inpatient Hospital Stay
Admission: EM | Admit: 2016-01-15 | Discharge: 2016-01-16 | DRG: 065 | Disposition: A | Discharge status: Home / Self Care | Payer: Self-pay | Attending: Internal Medicine | Admitting: Internal Medicine

## 2016-01-15 DIAGNOSIS — Z6841 Body Mass Index (BMI) 40.0 and over, adult: Secondary | ICD-10-CM

## 2016-01-15 DIAGNOSIS — E785 Hyperlipidemia, unspecified: Secondary | ICD-10-CM

## 2016-01-15 DIAGNOSIS — E1159 Type 2 diabetes mellitus with other circulatory complications: Secondary | ICD-10-CM | POA: Diagnosis present

## 2016-01-15 DIAGNOSIS — R4781 Slurred speech: Secondary | ICD-10-CM | POA: Diagnosis present

## 2016-01-15 DIAGNOSIS — Z7982 Long term (current) use of aspirin: Secondary | ICD-10-CM

## 2016-01-15 DIAGNOSIS — E1149 Type 2 diabetes mellitus with other diabetic neurological complication: Secondary | ICD-10-CM | POA: Diagnosis present

## 2016-01-15 DIAGNOSIS — Z8673 Personal history of transient ischemic attack (TIA), and cerebral infarction without residual deficits: Secondary | ICD-10-CM | POA: Diagnosis present

## 2016-01-15 DIAGNOSIS — Z87891 Personal history of nicotine dependence: Secondary | ICD-10-CM

## 2016-01-15 DIAGNOSIS — R29810 Facial weakness: Secondary | ICD-10-CM | POA: Diagnosis present

## 2016-01-15 DIAGNOSIS — Z7984 Long term (current) use of oral hypoglycemic drugs: Secondary | ICD-10-CM

## 2016-01-15 DIAGNOSIS — E1165 Type 2 diabetes mellitus with hyperglycemia: Secondary | ICD-10-CM | POA: Diagnosis present

## 2016-01-15 DIAGNOSIS — R29898 Other symptoms and signs involving the musculoskeletal system: Secondary | ICD-10-CM

## 2016-01-15 DIAGNOSIS — E1169 Type 2 diabetes mellitus with other specified complication: Secondary | ICD-10-CM | POA: Diagnosis present

## 2016-01-15 DIAGNOSIS — I10 Essential (primary) hypertension: Secondary | ICD-10-CM | POA: Diagnosis present

## 2016-01-15 DIAGNOSIS — R471 Dysarthria and anarthria: Secondary | ICD-10-CM | POA: Diagnosis present

## 2016-01-15 DIAGNOSIS — E784 Other hyperlipidemia: Secondary | ICD-10-CM | POA: Diagnosis present

## 2016-01-15 DIAGNOSIS — I639 Cerebral infarction, unspecified: Principal | ICD-10-CM | POA: Diagnosis present

## 2016-01-15 DIAGNOSIS — E118 Type 2 diabetes mellitus with unspecified complications: Secondary | ICD-10-CM

## 2016-01-15 DIAGNOSIS — E669 Obesity, unspecified: Secondary | ICD-10-CM | POA: Diagnosis present

## 2016-01-15 DIAGNOSIS — G8191 Hemiplegia, unspecified affecting right dominant side: Secondary | ICD-10-CM | POA: Diagnosis present

## 2016-01-15 DIAGNOSIS — R29703 NIHSS score 3: Secondary | ICD-10-CM | POA: Diagnosis present

## 2016-01-15 DIAGNOSIS — I152 Hypertension secondary to endocrine disorders: Secondary | ICD-10-CM | POA: Diagnosis present

## 2016-01-15 DIAGNOSIS — Z9114 Patient's other noncompliance with medication regimen: Secondary | ICD-10-CM

## 2016-01-15 LAB — CBC WITH DIFFERENTIAL/PLATELET
Basophils Absolute: 0.1 10*3/uL (ref 0–0.1)
Eosinophils Absolute: 0.2 10*3/uL (ref 0–0.7)
Eosinophils Relative: 2 %
HEMATOCRIT: 41.4 % (ref 40.0–52.0)
HEMOGLOBIN: 14.2 g/dL (ref 13.0–18.0)
LYMPHS ABS: 3.4 10*3/uL (ref 1.0–3.6)
MCH: 29.2 pg (ref 26.0–34.0)
MCHC: 34.3 g/dL (ref 32.0–36.0)
MCV: 85.2 fL (ref 80.0–100.0)
MONO ABS: 0.8 10*3/uL (ref 0.2–1.0)
NEUTROS ABS: 6.8 10*3/uL — AB (ref 1.4–6.5)
Platelets: 252 10*3/uL (ref 150–440)
RBC: 4.86 MIL/uL (ref 4.40–5.90)
RDW: 14.1 % (ref 11.5–14.5)
WBC: 11.3 10*3/uL — ABNORMAL HIGH (ref 3.8–10.6)

## 2016-01-15 LAB — COMPREHENSIVE METABOLIC PANEL
ALBUMIN: 4.5 g/dL (ref 3.5–5.0)
ALT: 44 U/L (ref 17–63)
ANION GAP: 10 (ref 5–15)
AST: 39 U/L (ref 15–41)
Alkaline Phosphatase: 67 U/L (ref 38–126)
BUN: 18 mg/dL (ref 6–20)
CHLORIDE: 103 mmol/L (ref 101–111)
CO2: 26 mmol/L (ref 22–32)
Calcium: 9.7 mg/dL (ref 8.9–10.3)
Creatinine, Ser: 1.06 mg/dL (ref 0.61–1.24)
GFR calc Af Amer: >60 mL/min (ref >60)
GFR calc non Af Amer: >60 mL/min (ref >60)
GLUCOSE: 145 mg/dL — AB (ref 65–99)
POTASSIUM: 3.9 mmol/L (ref 3.5–5.1)
SODIUM: 139 mmol/L (ref 135–145)
Total Bilirubin: 0.9 mg/dL (ref 0.3–1.2)
Total Protein: 8 g/dL (ref 6.5–8.1)

## 2016-01-15 LAB — TROPONIN I: Troponin I: <0.03 ng/mL (ref <0.031)

## 2016-01-15 LAB — PROTIME-INR
INR: 0.96
Prothrombin Time: 13 seconds (ref 11.4–15.0)

## 2016-01-15 MED ORDER — ALBUTEROL SULFATE (2.5 MG/3ML) 0.083% IN NEBU: 3.0000 mL | INHALATION_SOLUTION | Freq: Four times a day (QID) | RESPIRATORY_TRACT | Status: DC | PRN

## 2016-01-15 MED ORDER — ASPIRIN 325 MG PO TABS
325.0000 mg | ORAL_TABLET | Freq: Once | ORAL | Status: AC
  Administered 2016-01-15: 325 mg via ORAL
  Filled 2016-01-15: qty 1

## 2016-01-15 MED ORDER — ASPIRIN 81 MG PO CHEW
81.0000 mg | CHEWABLE_TABLET | Freq: Every day | ORAL | Status: DC
  Administered 2016-01-16: 10:00:00 81 mg via ORAL
  Filled 2016-01-15: qty 1

## 2016-01-15 MED ORDER — PRAVASTATIN SODIUM 40 MG PO TABS
40.0000 mg | ORAL_TABLET | Freq: Every day | ORAL | Status: DC
  Administered 2016-01-16: 40 mg via ORAL
  Filled 2016-01-15: qty 1

## 2016-01-15 MED ORDER — INSULIN ASPART 100 UNIT/ML ~~LOC~~ SOLN: 0.0000 [IU] | Freq: Every day | SUBCUTANEOUS | Status: DC

## 2016-01-15 MED ORDER — IOPAMIDOL (ISOVUE-370) INJECTION 76%
75.0000 mL | Freq: Once | INTRAVENOUS | Status: AC | PRN
  Administered 2016-01-15: 75 mL via INTRAVENOUS

## 2016-01-15 MED ORDER — INSULIN ASPART 100 UNIT/ML ~~LOC~~ SOLN
0.0000 [IU] | Freq: Three times a day (TID) | SUBCUTANEOUS | Status: DC
  Administered 2016-01-16: 10:00:00 1 [IU] via SUBCUTANEOUS
  Administered 2016-01-16: 12:00:00 3 [IU] via SUBCUTANEOUS
  Filled 2016-01-15: qty 3
  Filled 2016-01-15: qty 1

## 2016-01-15 MED ORDER — ENOXAPARIN SODIUM 40 MG/0.4ML ~~LOC~~ SOLN: 40.0000 mg | SUBCUTANEOUS | Status: DC

## 2016-01-15 MED ORDER — STROKE: EARLY STAGES OF RECOVERY BOOK
Freq: Once | Status: AC
  Administered 2016-01-16: 01:00:00

## 2016-01-15 NOTE — H&P (Signed)
[facility] [place] NAME: Jamie Finley    MR#:  [id number]  DATE OF BIRTH:  May 17, 1961  DATE OF ADMISSION:  01/15/2016  PRIMARY CARE PHYSICIAN: Keith Rake, MD   REQUESTING/REFERRING PHYSICIAN: Reita Cliche, MD  CHIEF COMPLAINT:   Chief Complaint  Patient presents with  . Code Stroke    HISTORY OF PRESENT ILLNESS:  Jamie Finley  is a 55 y.o. male who presents with .Patient was helping his brother set up a tent in the yard when he noticed onset of right upper extremity weakness, some mild ataxia, and some waxing and waning slurred speech as well as some intermittent word finding difficulty. He came to the ED for evaluation. Workup here is largely negative upfront, though his symptoms are persistent. Hospitalists were called for admission.  PAST MEDICAL HISTORY:   Past Medical History  Diagnosis Date  . Diabetes mellitus without complication ([place])   . Hypertension   . Hyperlipidemia     PAST SURGICAL HISTORY:   Past Surgical History  Procedure Laterality Date  . Back surgery  2007    [facility]  . Tonsillectomy      SOCIAL HISTORY:   Social History  Substance Use Topics  . Smoking status: Former Smoker    Quit date: 09/11/1989  . Smokeless tobacco: Never Used  . Alcohol Use: No    FAMILY HISTORY:   Family History  Problem Relation Age of Onset  . Cancer Mother     face and jaw  . Cancer Father     kidney    DRUG ALLERGIES:  No Known Allergies  MEDICATIONS AT HOME:   Prior to Admission medications   Medication Sig Start Date End Date Taking? Authorizing Provider  albuterol (PROVENTIL HFA;VENTOLIN HFA) 108 (90 Base) MCG/ACT inhaler Inhale 2 puffs into the lungs every 6 (six) hours as needed. Patient taking differently: Inhale 2 puffs into the lungs every 6 (six) hours as needed for wheezing or shortness of breath.  11/15/15  Yes Loney Hering, MD  aspirin 81 MG tablet Take 81 mg by mouth daily.   Yes Roselee Nova, MD  lisinopril-hydrochlorothiazide (PRINZIDE,ZESTORETIC) 10-12.5 MG tablet Take 1 tablet by mouth daily. 08/09/15  Yes Roselee Nova, MD  metFORMIN (GLUCOPHAGE) 1000 MG tablet Take 1 tablet (1,000 mg total) by mouth 2 (two) times daily with a meal. 02/18/15  Yes Roselee Nova, MD  pravastatin (PRAVACHOL) 40 MG tablet Take 1 tablet (40 mg total) by mouth daily. 02/18/15  Yes Roselee Nova, MD    REVIEW OF SYSTEMS:  Review of Systems  Constitutional: Negative for fever, chills, weight loss and malaise/fatigue.  HENT: Negative for ear pain, hearing loss and tinnitus.   Eyes: Negative for blurred vision, double vision, pain and redness.  Respiratory: Negative for cough, hemoptysis and shortness of breath.   Cardiovascular: Negative for chest pain, palpitations, orthopnea and leg swelling.  Gastrointestinal: Negative for nausea, vomiting, abdominal pain, diarrhea and constipation.  Genitourinary: Negative for dysuria, frequency and hematuria.  Musculoskeletal: Negative for back pain, joint pain and neck pain.  Skin:       No acne, rash, or lesions  Neurological: Positive for speech change and focal weakness. Negative for dizziness, tremors and weakness.  Endo/Heme/Allergies: Negative for polydipsia. Does not bruise/bleed easily.  Psychiatric/Behavioral: Negative for depression. The patient is not nervous/anxious and does not have insomnia.      VITAL SIGNS:   Danley Danker  Vitals:   01/15/16 2030 01/15/16 2100 01/15/16 2200 01/15/16 2230  BP: 131/90 118/81 144/81 140/81  Pulse: 82 76 75 76  Resp: 12 20 17 15   Height:      Weight:      SpO2: 98% 97% 95% 96%   Wt Readings from Last 3 Encounters:  01/15/16 148.054 kg (326 lb 6.4 oz)  11/14/15 149.687 kg (330 lb)  08/09/15 150.05 kg (330 lb 12.8 oz)    PHYSICAL EXAMINATION:  Physical Exam  Vitals reviewed. Constitutional: He is oriented to person, place, and time. He appears well-developed and well-nourished. No distress.   HENT:  Head: Normocephalic and atraumatic.  Mouth/Throat: Oropharynx is clear and moist.  Eyes: Conjunctivae and EOM are normal. Pupils are equal, round, and reactive to light. No scleral icterus.  Neck: Normal range of motion. Neck supple. No JVD present. No thyromegaly present.  Cardiovascular: Normal rate, regular rhythm and intact distal pulses.  Exam reveals no gallop and no friction rub.   No murmur heard. Respiratory: Effort normal and breath sounds normal. No respiratory distress. He has no wheezes. He has no rales.  GI: Soft. Bowel sounds are normal. He exhibits no distension. There is no tenderness.  Musculoskeletal: Normal range of motion. He exhibits no edema.  No arthritis, no gout  Lymphadenopathy:    He has no cervical adenopathy.  Neurological: He is alert and oriented to person, place, and time. No cranial nerve deficit.  Neurologic: Cranial nerves II-XII intact, Sensation intact to light touch/pinprick, 5/5 strength in all extremities except for right upper extremity which demonstrates 3/5 strength, mild dysarthria, no aphasia, no dysphagia, memory intact, mild right upper extremity pronator drift, gait testing deferred.   Skin: Skin is warm and dry. No rash noted. No erythema.  Psychiatric: He has a normal mood and affect. His behavior is normal. Judgment and thought content normal.    LABORATORY PANEL:   CBC  Recent Labs Lab 01/15/16 2000  WBC 11.3*  HGB 14.2  HCT 41.4  PLT 252   ------------------------------------------------------------------------------------------------------------------  Chemistries   Recent Labs Lab 01/15/16 2000  NA 139  K 3.9  CL 103  CO2 26  GLUCOSE 145*  BUN 18  CREATININE 1.06  CALCIUM 9.7  AST 39  ALT 44  ALKPHOS 67  BILITOT 0.9   ------------------------------------------------------------------------------------------------------------------  Cardiac Enzymes  Recent Labs Lab 01/15/16 2000  TROPONINI <0.03    ------------------------------------------------------------------------------------------------------------------  RADIOLOGY:  Ct Angio Head W/cm &/or Wo Cm  01/15/2016  CLINICAL DATA:  55 year old diabetic hypertensive male with hyperlipidemia presenting with falling and right hand and arm weakness/ numbness and slurred speech. Subsequent encounter. EXAM: CT ANGIOGRAPHY HEAD TECHNIQUE: Multidetector CT imaging of the head was performed using the standard protocol during bolus administration of intravenous contrast. Multiplanar CT image reconstructions and MIPs were obtained to evaluate the vascular anatomy. CONTRAST:  75 cc Isovue 370. COMPARISON:  01/15/2016 and 01/06/2015 head CT. FINDINGS: CT HEAD Brain: Question small acute infarct left corona radiata/posterior limb left internal capsule. No intracranial hemorrhage. No intracranial mass or abnormal enhancement. No hydrocephalus. Calvarium and skull base: No destructive lesion. Paranasal sinuses: Clear Orbits: Exophthalmos. CTA HEAD Anterior circulation: Anterior circulation without medium or large size vessel significant stenosis or occlusion. Mild irregularity M1 segment left middle cerebral artery. Mild branch vessel irregularity. Posterior circulation: Right vertebral artery is small after takeoff of the right posterior inferior cerebellar artery. Slight irregularity basilar artery without significant narrowing. Venous sinuses: Patent. Anatomic variants: Negative Delayed phase:As above. IMPRESSION:  CT HEAD Question small acute infarct left corona radiata/posterior limb left internal capsule. No intracranial hemorrhage. No intracranial mass or abnormal enhancement. Exophthalmos. CTA HEAD Anterior circulation without medium or large size vessel significant stenosis or occlusion. Mild irregularity M1 segment left middle cerebral artery. Mild branch vessel irregularity. Right vertebral artery is small after takeoff of the right posterior inferior  cerebellar artery. Slight irregularity basilar artery without significant narrowing. Electronically Signed   By: Genia Del M.D.   On: 01/15/2016 22:04   Ct Head Wo Contrast  01/15/2016  CLINICAL DATA:  Paresthesias and weakness in the right face. Onset at 17:45. EXAM: CT HEAD WITHOUT CONTRAST TECHNIQUE: Contiguous axial images were obtained from the base of the skull through the vertex without intravenous contrast. COMPARISON:  04/24/2012, 01/06/2015. FINDINGS: There is no intracranial hemorrhage, mass or evidence of acute infarction. There is no extra-axial fluid collection. Gray matter and white matter appear normal. Cerebral volume is normal for age. Brainstem and posterior fossa are unremarkable. The CSF spaces appear normal. The bony structures are intact. The visible portions of the paranasal sinuses are clear. The orbits are unremarkable. IMPRESSION: Normal brain. These results were called by telephone at the time of interpretation on 01/15/2016 at 7:52 pm to Dr. Lisa Roca, who verbally acknowledged these results. Electronically Signed   By: Andreas Newport M.D.   On: 01/15/2016 19:55    EKG:   Orders placed or performed in visit on 08/20/14  . EKG 12-Lead    IMPRESSION AND PLAN:  Principal Problem:   Stroke [facility]) - CT head in the ED and showed questionable left corona radiata/posterior limb left internal capsule infarct, CTA head was also done and showed no significant flow-limiting abnormalities. We will admit per stroke workup order set with MRI imaging, neurology consult, appropriate labs, echocardiogram. Active Problems:   Diabetes mellitus type 2, controlled, with complications (HCC) - sliding scale insulin with corresponding glucose checks and carb modified diet   BP (high blood pressure) - permissive hypertension first 24 hours blood pressure goal less than 220/120, hold home antihypertensives for now.   Hyperlipidemia associated with type 2 diabetes mellitus ([place]) - continue  home dose statin  All the records are reviewed and case discussed with ED provider. Management plans discussed with the patient and/or family.  DVT PROPHYLAXIS: SubQ lovenox  GI PROPHYLAXIS: None  ADMISSION STATUS: Inpatient  CODE STATUS: Full Code Status History    This patient does not have a recorded code status. Please follow your organizational policy for patients in this situation.      TOTAL TIME TAKING CARE OF THIS PATIENT: 45 minutes.    Aum Caggiano [place] 01/15/2016, 11:07 PM  [facility]  Office  [phone]  CC: Primary care physician; Keith Rake, MD

## 2016-01-15 NOTE — ED Notes (Signed)
Pt reports falling around 1730 with right hand and arm weakness at approx. 1745. Pt's speech sounds slightly slurred of speech with continuing numbness in the right hand and arm. Pt is a/o with NAD noted at this time.

## 2016-01-15 NOTE — ED Provider Notes (Signed)
[facility] Emergency Department Provider Note   ____________________________________________  Time seen: I have reviewed the triage vital signs and the triage nursing note.  HISTORY  Chief Complaint Code Stroke   Historian Patient  HPI Jamie Finley is a 55 y.o. male who is here for evaluation of right arm clumsiness/grip weakness that he noticed around 5:30 to 545 this evening as he was helping to put up a tent outside with his mother, that progressed over the next hour or so to include trouble finding his words and some mild slurred speech.  He's had no history of prior stroke. He is treated for multiple risk factors including diabetes, hypertension, and hyperlipidemia.  He takes a baby aspirin daily.  Denies headache. Denies vision changes. Denies nausea and vomiting. denies recent illnesses.  No chest pain or trouble breathing.    Past Medical History  Diagnosis Date  . Diabetes mellitus without complication ([place])   . Hypertension   . Hyperlipidemia     Patient Active Problem List   Diagnosis Date Noted  . Hyperlipidemia associated with type 2 diabetes mellitus ([place]) 02/18/2015  . Peripheral neuropathy ([place]) 02/18/2015  . Diabetes mellitus type 2, controlled, with complications ([place]) 99991111  . Diabetes ([place]) 12/15/2014  . Breathlessness on exertion 12/15/2014  . BP (high blood pressure) 12/15/2014  . Extreme obesity ([place]) 12/15/2014    Past Surgical History  Procedure Laterality Date  . Back surgery  2007    [facility]  . Tonsillectomy      Current Outpatient Rx  Name  Route  Sig  Dispense  Refill  . albuterol (PROVENTIL HFA;VENTOLIN HFA) 108 (90 Base) MCG/ACT inhaler   Inhalation   Inhale 2 puffs into the lungs every 6 (six) hours as needed.   1 Inhaler   0   . aspirin 81 MG tablet   Oral   Take 81 mg by mouth daily.         Marland Kitchen lisinopril-hydrochlorothiazide (PRINZIDE,ZESTORETIC) 10-12.5 MG tablet   Oral   Take 1 tablet  by mouth daily.   90 tablet   0   . lisinopril-hydrochlorothiazide (PRINZIDE,ZESTORETIC) 10-12.5 MG tablet   Oral   Take 1 tablet by mouth daily.   30 tablet   0   . metFORMIN (GLUCOPHAGE) 1000 MG tablet   Oral   Take 1 tablet (1,000 mg total) by mouth 2 (two) times daily with a meal.   180 tablet   3   . metFORMIN (GLUCOPHAGE) 1000 MG tablet   Oral   Take 1 tablet (1,000 mg total) by mouth 2 (two) times daily with a meal.   60 tablet   0   . pravastatin (PRAVACHOL) 40 MG tablet   Oral   Take 1 tablet (40 mg total) by mouth daily. Patient not taking: Reported on 08/09/2015   90 tablet   0   . predniSONE (DELTASONE) 20 MG tablet   Oral   Take 3 tablets (60 mg total) by mouth daily.   12 tablet   0     Allergies Review of patient's allergies indicates no known allergies.  Family History  Problem Relation Age of Onset  . Cancer Mother     face and jaw  . Cancer Father     kidney    Social History Social History  Substance Use Topics  . Smoking status: Former Smoker    Quit date: 09/11/1989  . Smokeless tobacco: Never Used  . Alcohol Use: No    Review  of Systems  Constitutional: Negative for fever. Eyes: Negative for visual changes. ENT: Negative for sore throat. Cardiovascular: Negative for chest pain. Respiratory: Negative for shortness of breath. Gastrointestinal: Negative for abdominal pain, vomiting and diarrhea. Genitourinary: Negative for dysuria. Musculoskeletal: Negative for back pain. Skin: Negative for rash. Neurological: Negative for headache. 10 point Review of Systems otherwise negative ____________________________________________   PHYSICAL EXAM:  VITAL SIGNS: ED Triage Vitals  Enc Vitals Group     BP 01/15/16 1954 160/94 mmHg     Pulse Rate 01/15/16 1954 92     Resp 01/15/16 1954 17     Temp --      Temp src --      SpO2 01/15/16 1954 98 %     Weight 01/15/16 1954 326 lb 6.4 oz (148.054 kg)     Height 01/15/16 1954 6'  (1.829 m)     Head Cir --      Peak Flow --      Pain Score --      Pain Loc --      Pain Edu? --      Excl. in [place]? --      Constitutional: Alert and oriented. Well appearing and in no distress. HEENT   Head: Normocephalic and atraumatic.      Eyes: Conjunctivae are normal. PERRL. Normal extraocular movements.      Ears:         Nose: No congestion/rhinnorhea.   Mouth/Throat: Mucous membranes are moist.   Neck: No stridor. Cardiovascular/Chest: Normal rate, regular rhythm.  No murmurs, rubs, or gallops. Respiratory: Normal respiratory effort without tachypnea nor retractions. Breath sounds are clear and equal bilaterally. No wheezes/rales/rhonchi. Gastrointestinal: Soft. No distention, no guarding, no rebound. Nontender.    Genitourinary/rectal:Deferred Musculoskeletal: Nontender with normal range of motion in all extremities. No joint effusions.  No lower extremity tenderness.  No edema. Neurologic:  Some stuttering or hesitation in finding his words. Some mild slurred speech. No visible facial droop. No sensory changes. Some mild decreased grip strength in the right upper extremity. Skin:  Skin is warm, dry and intact. No rash noted. Psychiatric: Mood and affect are normal. Speech and behavior are normal. Patient exhibits appropriate insight and judgment.  ____________________________________________   EKG I, Lisa Roca, MD, the attending physician have personally viewed and interpreted all ECGs.  80 beats per minute. normal sinus rhythm. Narrow QRS. Normal axis. Normal ST and T-wave ____________________________________________  LABS (pertinent positives/negatives)  Comprehensive metabolic panel without significant abnormalities White blood count 11.3, hemoglobin 14.2 platelet count 252 Troponin less than 0.03 INR 0.96  ____________________________________________  RADIOLOGY All Xrays were viewed by me. Imaging interpreted by Radiologist.  CT without  contrast: IMPRESSION: Normal brain. These results were called by telephone at the time of interpretation on 01/15/2016 at 7:52 pm to Dr. Lisa Roca, who verbally acknowledged these results.  CT angiogram head:  CTA HEAD  Anterior circulation without medium or large size vessel significant stenosis or occlusion. Mild irregularity M1 segment left middle cerebral artery. Mild branch vessel irregularity.  Right vertebral artery is small after takeoff of the right posterior inferior cerebellar artery.  Slight irregularity basilar artery without significant narrowing. __________________________________________  PROCEDURES  Procedure(s) performed: None  Critical Care performed: CRITICAL CARE Performed by: Lisa Roca   Total critical care time: 60 minutes  Critical care time was exclusive of separately billable procedures and treating other patients.  Critical care was necessary to treat or prevent imminent or life-threatening deterioration.  Critical  care was time spent personally by me on the following activities: development of treatment plan with patient and/or surrogate as well as nursing, discussions with consultants, evaluation of patient's response to treatment, examination of patient, obtaining history from patient or surrogate, ordering and performing treatments and interventions, ordering and review of laboratory studies, ordering and review of radiographic studies, pulse oximetry and re-evaluation of patient's condition.   ____________________________________________   ED COURSE / ASSESSMENT AND PLAN  Pertinent labs & imaging results that were available during my care of the patient were reviewed by me and considered in my medical decision making (see chart for details).   Patient was made a code stroke with symptoms within the past 3 hours upon arrival. NIH score 1-2 with grip strength abnormality in dysarthria.  Relatively minor symptoms overall.  This was  discussed with the neurologist with specialist on-call who did not recommend TPA due to minor symptoms.  Head CT was read as negative, and the neurologist recommended CT head angiogram.  Patient to be admitted to the medical service for additional stroke workup. Patient was given 325 aspirin.    CONSULTATIONS:   Specialist on-call neurologist, made recommendations. Hospitalist for admission.  Patient / Family / Caregiver informed of clinical course, medical decision-making process, and agree with plan.     ___________________________________________   FINAL CLINICAL IMPRESSION(S) / ED DIAGNOSES   Final diagnoses:  Dysarthria  Right arm weakness              Note: This dictation was prepared with Dragon dictation. Any transcriptional errors that result from this process are unintentional   Lisa Roca, MD 01/15/16 2229

## 2016-01-16 ENCOUNTER — Inpatient Hospital Stay: Payer: Self-pay

## 2016-01-16 ENCOUNTER — Inpatient Hospital Stay
Admit: 2016-01-16 | Discharge: 2016-01-16 | Disposition: A | Discharge status: Home / Self Care | Payer: Self-pay | Attending: Internal Medicine | Admitting: Internal Medicine

## 2016-01-16 DIAGNOSIS — I639 Cerebral infarction, unspecified: Principal | ICD-10-CM

## 2016-01-16 LAB — LIPID PANEL
CHOLESTEROL: 220 mg/dL — AB (ref 0–200)
HDL: 41 mg/dL (ref >40)
LDL Cholesterol: 123 mg/dL — ABNORMAL HIGH (ref 0–99)
Total CHOL/HDL Ratio: 5.4 RATIO
Triglycerides: 280 mg/dL — ABNORMAL HIGH (ref <150)
VLDL: 56 mg/dL — ABNORMAL HIGH (ref 0–40)

## 2016-01-16 LAB — ECHOCARDIOGRAM COMPLETE
HEIGHTINCHES: 72 in
Weight: 5222.4 oz

## 2016-01-16 LAB — GLUCOSE, CAPILLARY
GLUCOSE-CAPILLARY: 148 mg/dL — AB (ref 65–99)
GLUCOSE-CAPILLARY: 212 mg/dL — AB (ref 65–99)
Glucose-Capillary: 145 mg/dL — ABNORMAL HIGH (ref 65–99)

## 2016-01-16 LAB — HEMOGLOBIN A1C: HEMOGLOBIN A1C: 8.1 % — AB (ref 4.0–6.0)

## 2016-01-16 MED ORDER — CLOPIDOGREL BISULFATE 75 MG PO TABS
75.0000 mg | ORAL_TABLET | Freq: Every day | ORAL | Status: DC
Start: 2016-01-16 — End: 2016-01-16
  Administered 2016-01-16: 75 mg via ORAL
  Filled 2016-01-16: qty 1

## 2016-01-16 MED ORDER — LORAZEPAM 2 MG/ML IJ SOLN
1.0000 mg | Freq: Once | INTRAMUSCULAR | Status: AC
  Administered 2016-01-16: 12:00:00 1 mg via INTRAVENOUS
  Filled 2016-01-16: qty 1

## 2016-01-16 MED ORDER — CLOPIDOGREL BISULFATE 75 MG PO TABS: 75.0000 mg | ORAL_TABLET | Freq: Every day | ORAL | Status: DC

## 2016-01-16 MED ORDER — BACITRACIN ZINC 500 UNIT/GM EX OINT
TOPICAL_OINTMENT | Freq: Two times a day (BID) | CUTANEOUS | Status: DC
  Administered 2016-01-16 (×2): via TOPICAL
  Filled 2016-01-16: qty 28.35

## 2016-01-16 MED ORDER — ENOXAPARIN SODIUM 40 MG/0.4ML ~~LOC~~ SOLN
40.0000 mg | Freq: Two times a day (BID) | SUBCUTANEOUS | Status: DC
  Administered 2016-01-16: 10:00:00 40 mg via SUBCUTANEOUS
  Filled 2016-01-16: qty 0.4

## 2016-01-16 NOTE — Progress Notes (Signed)
Physical Therapy Evaluation Patient Details Name: Jamie Finley MRN: [id number] DOB: August 13, 1961 Today's Date: 01/16/2016   History of Present Illness  Pt. is a 55 year old male admitted to [facility] on 01/15/16 after developing RUE weakness, ataxia, slurred speed and word finding issues after working in the yard.   Clinical Impression  Patient demonstrates decreased RUE and RLE strength as well as decreased RUE coordination (decreased finger to chin testing, decreased grip strength and decreased rapid alternating movements of the RUE). Patient is RHD and has noticed issues with using his phone as well. Patient ambulated with CGA only for safety during his evaluation today without gait deviations or loss of balance noted. Patient may benefit from PT for gait, mobility tasks, transfers and strengthening while at [facility] to help return to his prior level of function.   Follow Up Recommendations Outpatient PT    Equipment Recommendations  Rolling walker with 5" wheels (hemiwalker or cane dependent on progress)    Recommendations for Other Services       Precautions / Restrictions Precautions Precautions: Fall Restrictions Weight Bearing Restrictions: No      Mobility  Bed Mobility Overal bed mobility: Independent                Transfers Overall transfer level: Needs assistance Equipment used: Rolling walker (2 wheeled)             General transfer comment: CGA for safety with sit <-> stand  Ambulation/Gait Ambulation/Gait assistance: Min guard Ambulation Distance (Feet): 28 Feet (patient did want to stay in the room today) Assistive device: Rolling walker (2 wheeled) (pt. was able to grip walker handle) Gait Pattern/deviations: WFL(Within Functional Limits)        Stairs            Wheelchair Mobility    Modified Rankin (Stroke Patients Only)       Balance   Sitting-balance support: No upper extremity supported (pt. does appear to be leaning to the L in  sitting)       Standing balance support: Bilateral upper extremity supported   Standing balance comment: No loss of balance noted                             Pertinent Vitals/Pain Pain Assessment: 0-10 Pain Score: 5  Pain Location: R shin where he fell prior to coming into [facility] Pain Intervention(s): Limited activity within patient's tolerance;Monitored during session (Pt. reports that RN is aware)    Home Living Family/patient expects to be discharged to:: Private residence Living Arrangements: Alone   Type of Home: Mobile home Home Access: Ramped entrance     Home Layout: One level Home Equipment: Cane - quad (due to prior back issues)      Prior Function Level of Independence: Independent               Hand Dominance   Dominant Hand: Right    Extremity/Trunk Assessment   Upper Extremity Assessment: RUE deficits/detail (LUE appears WFL for tasks assigned) RUE Deficits / Details: decreased grip strength; decreased shoulder flexion 4+/5   RUE Sensation:  (reports tingling in R hand)     Lower Extremity Assessment: RLE deficits/detail (LLE appears WFL for tasks assigned) RLE Deficits / Details: hip flexion strength 4/5; knee extension 4+/5; knee flexion 4+/5; dorsiflexion 4+/5       Communication   Communication: No difficulties  Cognition Arousal/Alertness: Awake/alert Behavior During Therapy: WFL for tasks  assessed/performed Overall Cognitive Status: Within Functional Limits for tasks assessed                      General Comments General comments (skin integrity, edema, etc.): Pt. does have abrasions noted on his shin on the R from his fall;pt. reports that he keeps a close eye on cuts that he gets because of his tendency for cellulitis    Exercises        Assessment/Plan    PT Assessment Patient needs continued PT services  PT Diagnosis Difficulty walking (RUE and RLE weakness)   PT Problem List Decreased  strength;Decreased mobility  PT Treatment Interventions Gait training;Functional mobility training;Therapeutic activities;Therapeutic exercise (balance/neuro re-ed; instruction in asstive device if needed)   PT Goals (Current goals can be found in the Care Plan section) Acute Rehab PT Goals Patient Stated Goal: To go home PT Goal Formulation: With patient Time For Goal Achievement: 01/30/16 Potential to Achieve Goals: Good    Frequency 7X/week   Barriers to discharge        Co-evaluation               End of Session Equipment Utilized During Treatment: Gait belt Activity Tolerance: Patient tolerated treatment well Patient left: in bed;with call bell/phone within reach;with bed alarm set           Time: 1130-1142 PT Time Calculation (min) (ACUTE ONLY): 12 min   Charges:   PT Evaluation $PT Eval Low Complexity: 1 Procedure     PT G Codes:        Bertram Denver, PT, DPT, CWCE 01/16/2016, 1:02 PM

## 2016-01-16 NOTE — Discharge Instructions (Signed)
Heart healthy and ADA diet. Outpatient PT. Follow up neurology or PCP for MRI brain as outpatient. Exercise and diet control.

## 2016-01-16 NOTE — Progress Notes (Signed)
Dr Bridgett Larsson and Dr Doy Mince aware that pt unable to have MRI related to pt states difficulty breathing when lying on his back and claustrophobia, US carotid ordered and if results ok pt can be discharged per Dr Doy Mince

## 2016-01-16 NOTE — Progress Notes (Signed)
Per Dr Bridgett Larsson ok to discharge pt now, pt can follow up outpt neurology

## 2016-01-16 NOTE — Discharge Summary (Signed)
[place] [facility] [place] NAME: Jamie Finley    MR#:  [id number]  DATE OF BIRTH:  June 24, 1961  DATE OF ADMISSION:  01/15/2016 ADMITTING PHYSICIAN: Lance Coon, MD  DATE OF DISCHARGE: 01/16/2016 PRIMARY CARE PHYSICIAN: Keith Rake, MD    ADMISSION DIAGNOSIS:  Stroke [facility]) [I63.9] Right arm weakness [R29.898] Dysarthria [R47.1]   DISCHARGE DIAGNOSIS:  Stroke   SECONDARY DIAGNOSIS:   Past Medical History  Diagnosis Date  . Diabetes mellitus without complication ([place])   . Hypertension   . Hyperlipidemia     HOSPITAL COURSE:  Stroke [facility]) - CT head in the ED and showed questionable left corona radiata/posterior limb left internal capsule infarct, CTA head was also done and showed no significant flow-limiting abnormalities.  The patient could not get MRI of brain due to obesity. Per neurology consult, outpatient MRI of brain, Add Plavix, continue aspirin and statin, normal echocardiogram. Pending carotid duplex. PT evaluation suggested outpatient PT.   Diabetes mellitus type 2, controlled, with complications ([place]) - sliding scale insulin with corresponding glucose checks and carb modified diet. Resume metformin after discharge.  Hypertension. Continue home medication list-HCTZ.    Hyperlipidemia associated with type 2 diabetes mellitus ([place]) - continue home dose statin  I discussed with Dr. Tyron Russell, neurologist. DISCHARGE CONDITIONS:   Stable, discharge to home today.  CONSULTS OBTAINED:  Treatment Team:  Alexis Goodell, MD  DRUG ALLERGIES:  No Known Allergies  DISCHARGE MEDICATIONS:   Current Discharge Medication List    START taking these medications   Details  clopidogrel (PLAVIX) 75 MG tablet Take 1 tablet (75 mg total) by mouth daily. Qty: 30 tablet, Refills: 2      CONTINUE these medications which have NOT CHANGED   Details  albuterol (PROVENTIL HFA;VENTOLIN HFA) 108 (90 Base) MCG/ACT inhaler Inhale 2  puffs into the lungs every 6 (six) hours as needed. Qty: 1 Inhaler, Refills: 0    aspirin 81 MG tablet Take 81 mg by mouth daily.    lisinopril-hydrochlorothiazide (PRINZIDE,ZESTORETIC) 10-12.5 MG tablet Take 1 tablet by mouth daily. Qty: 90 tablet, Refills: 0   Associated Diagnoses: Essential hypertension    metFORMIN (GLUCOPHAGE) 1000 MG tablet Take 1 tablet (1,000 mg total) by mouth 2 (two) times daily with a meal. Qty: 180 tablet, Refills: 3   Associated Diagnoses: Diabetes mellitus type 2, controlled, with complications ([facility])    pravastatin (PRAVACHOL) 40 MG tablet Take 1 tablet (40 mg total) by mouth daily. Qty: 90 tablet, Refills: 0   Associated Diagnoses: Hyperlipidemia associated with type 2 diabetes mellitus ([place])         DISCHARGE INSTRUCTIONS:    If you experience worsening of your admission symptoms, develop shortness of breath, life threatening emergency, suicidal or homicidal thoughts you must seek medical attention immediately by calling 911 or calling your MD immediately  if symptoms less severe.  You Must read complete instructions/literature along with all the possible adverse reactions/side effects for all the Medicines you take and that have been prescribed to you. Take any new Medicines after you have completely understood and accept all the possible adverse reactions/side effects.   Please note  You were cared for by a hospitalist during your hospital stay. If you have any questions about your discharge medications or the care you received while you were in the hospital after you are discharged, you can call the unit and asked to speak with the hospitalist on call if the hospitalist that took care of  you is not available. Once you are discharged, your primary care physician will handle any further medical issues. Please note that NO REFILLS for any discharge medications will be authorized once you are discharged, as it is imperative that you return to your primary  care physician (or establish a relationship with a primary care physician if you do not have one) for your aftercare needs so that they can reassess your need for medications and monitor your lab values.    Today   SUBJECTIVE   Facial numbness on the right side.   VITAL SIGNS:  Blood pressure 130/68, pulse 57, temperature 98.6 F (37 C), temperature source Oral, resp. rate 18, height 6' (1.829 m), weight 148.054 kg (326 lb 6.4 oz), SpO2 97 %.  I/O:   Intake/Output Summary (Last 24 hours) at 01/16/16 1443 Last data filed at 01/16/16 0900  Gross per 24 hour  Intake    240 ml  Output    350 ml  Net   -110 ml    PHYSICAL EXAMINATION:  GENERAL:  55 y.o.-year-old patient lying in the bed with no acute distress.  EYES: Pupils equal, round, reactive to light and accommodation. No scleral icterus. Extraocular muscles intact.  HEENT: Head atraumatic, normocephalic. Oropharynx and nasopharynx clear.  NECK:  Supple, no jugular venous distention. No thyroid enlargement, no tenderness.  LUNGS: Normal breath sounds bilaterally, no wheezing, rales,rhonchi or crepitation. No use of accessory muscles of respiration.  CARDIOVASCULAR: S1, S2 normal. No murmurs, rubs, or gallops.  ABDOMEN: Soft, non-tender, non-distended. Bowel sounds present. No organomegaly or mass.  EXTREMITIES: No pedal edema, cyanosis, or clubbing.  NEUROLOGIC: Cranial nerves II through XII are intact. Muscle strength 5/5 in all extremities. Sensation intact. Gait not checked.  PSYCHIATRIC: The patient is alert and oriented x 3.  SKIN: No obvious rash, lesion, or ulcer.   DATA REVIEW:   CBC  Recent Labs Lab 01/15/16 2000  WBC 11.3*  HGB 14.2  HCT 41.4  PLT 252    Chemistries   Recent Labs Lab 01/15/16 2000  NA 139  K 3.9  CL 103  CO2 26  GLUCOSE 145*  BUN 18  CREATININE 1.06  CALCIUM 9.7  AST 39  ALT 44  ALKPHOS 67  BILITOT 0.9    Cardiac Enzymes  Recent Labs Lab 01/15/16 2000  TROPONINI  <0.03    Microbiology Results  No results found for this or any previous visit.  RADIOLOGY:  Ct Angio Head W/cm &/or Wo Cm  01/15/2016  CLINICAL DATA:  55 year old diabetic hypertensive male with hyperlipidemia presenting with falling and right hand and arm weakness/ numbness and slurred speech. Subsequent encounter. EXAM: CT ANGIOGRAPHY HEAD TECHNIQUE: Multidetector CT imaging of the head was performed using the standard protocol during bolus administration of intravenous contrast. Multiplanar CT image reconstructions and MIPs were obtained to evaluate the vascular anatomy. CONTRAST:  75 cc Isovue 370. COMPARISON:  01/15/2016 and 01/06/2015 head CT. FINDINGS: CT HEAD Brain: Question small acute infarct left corona radiata/posterior limb left internal capsule. No intracranial hemorrhage. No intracranial mass or abnormal enhancement. No hydrocephalus. Calvarium and skull base: No destructive lesion. Paranasal sinuses: Clear Orbits: Exophthalmos. CTA HEAD Anterior circulation: Anterior circulation without medium or large size vessel significant stenosis or occlusion. Mild irregularity M1 segment left middle cerebral artery. Mild branch vessel irregularity. Posterior circulation: Right vertebral artery is small after takeoff of the right posterior inferior cerebellar artery. Slight irregularity basilar artery without significant narrowing. Venous sinuses: Patent. Anatomic variants: Negative Delayed  phase:As above. IMPRESSION: CT HEAD Question small acute infarct left corona radiata/posterior limb left internal capsule. No intracranial hemorrhage. No intracranial mass or abnormal enhancement. Exophthalmos. CTA HEAD Anterior circulation without medium or large size vessel significant stenosis or occlusion. Mild irregularity M1 segment left middle cerebral artery. Mild branch vessel irregularity. Right vertebral artery is small after takeoff of the right posterior inferior cerebellar artery. Slight irregularity  basilar artery without significant narrowing. Electronically Signed   By: Genia Del M.D.   On: 01/15/2016 22:04   Ct Head Wo Contrast  01/15/2016  CLINICAL DATA:  Paresthesias and weakness in the right face. Onset at 17:45. EXAM: CT HEAD WITHOUT CONTRAST TECHNIQUE: Contiguous axial images were obtained from the base of the skull through the vertex without intravenous contrast. COMPARISON:  04/24/2012, 01/06/2015. FINDINGS: There is no intracranial hemorrhage, mass or evidence of acute infarction. There is no extra-axial fluid collection. Gray matter and white matter appear normal. Cerebral volume is normal for age. Brainstem and posterior fossa are unremarkable. The CSF spaces appear normal. The bony structures are intact. The visible portions of the paranasal sinuses are clear. The orbits are unremarkable. IMPRESSION: Normal brain. These results were called by telephone at the time of interpretation on 01/15/2016 at 7:52 pm to Dr. Lisa Roca, who verbally acknowledged these results. Electronically Signed   By: Andreas Newport M.D.   On: 01/15/2016 19:55        Management plans discussed with the patient, family and they are in agreement.  CODE STATUS:     Code Status Orders        Start     Ordered   01/15/16 2348  Full code   Continuous     01/15/16 2347    Code Status History    Date Active Date Inactive Code Status Order ID Comments User Context   This patient has a current code status but no historical code status.      TOTAL TIME TAKING CARE OF THIS PATIENT: 35 minutes.    Demetrios Loll M.D on 01/16/2016 at 2:43 PM  Between 7am to 6pm - Pager - [phone]  After 6pm go to [email or web address] - password [email or web address] [facility]  [place] [facility]  Office  [phone]  CC: Primary care physician; Keith Rake, MD

## 2016-01-16 NOTE — Progress Notes (Signed)
Pt being discharged home, discharge instructions and prescriptions reviewed with pt, outpt PT prescription given to pt, pt to follow up with neurology and PCP, pt with no complaints of pain, no distress or discomfort noted at discharge

## 2016-01-16 NOTE — Progress Notes (Signed)
*  PRELIMINARY RESULTS* Echocardiogram 2D Echocardiogram has been performed.  [place] 01/16/2016, 8:53 AM

## 2016-01-16 NOTE — Consult Note (Addendum)
Referring Physician: Bridgett Larsson    Chief Complaint: Right hemiparesis and difficulty with speech  HPI: Jamie Finley is an 55 y.o. 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  He tried to continue to work but was having more difficulty.  He had a fall to the right and was brought in for evaluation.  Initial NIHSS of 3.  Date last known well: 01/15/2016 Time last known well: Time: 17:30 tPA Given: No: Minimal symptoms  mRankin:0  Past Medical History  Diagnosis Date  . Diabetes mellitus without complication ([place])   . Hypertension   . Hyperlipidemia     Past Surgical History  Procedure Laterality Date  . Back surgery  2007    [facility]  . Tonsillectomy      Family History  Problem Relation Age of Onset  . Cancer Mother     face and jaw  . Cancer Father     kidney   Social History:  reports that he quit smoking about 26 years ago. He has never used smokeless tobacco. He reports that he does not drink alcohol or use illicit drugs.  Allergies: No Known Allergies  Medications:  I have reviewed the patient's current medications. Prior to Admission:  Prescriptions prior to admission  Medication Sig Dispense Refill Last Dose  . albuterol (PROVENTIL HFA;VENTOLIN HFA) 108 (90 Base) MCG/ACT inhaler Inhale 2 puffs into the lungs every 6 (six) hours as needed. (Patient taking differently: Inhale 2 puffs into the lungs every 6 (six) hours as needed for wheezing or shortness of breath. ) 1 Inhaler 0 PRN at PRN  . aspirin 81 MG tablet Take 81 mg by mouth daily.   01/14/2016 at Unknown time  . lisinopril-hydrochlorothiazide (PRINZIDE,ZESTORETIC) 10-12.5 MG tablet Take 1 tablet by mouth daily. 90 tablet 0 01/14/2016 at Unknown time  . metFORMIN (GLUCOPHAGE) 1000 MG tablet Take 1 tablet (1,000 mg total) by mouth 2 (two) times daily with a meal. 180 tablet 3  01/15/2016 at Unknown time  . pravastatin (PRAVACHOL) 40 MG tablet Take 1 tablet (40 mg total) by mouth daily. 90 tablet 0 01/14/2016 at Unknown time   Scheduled: . aspirin  81 mg Oral Daily  . bacitracin   Topical BID  . enoxaparin (LOVENOX) injection  40 mg Subcutaneous BID  . insulin aspart  0-5 Units Subcutaneous QHS  . insulin aspart  0-9 Units Subcutaneous TID WC  . pravastatin  40 mg Oral Daily    ROS: History obtained from the patient  General ROS: negative for - chills, fatigue, fever, night sweats, weight gain or weight loss Psychological ROS: negative for - behavioral disorder, hallucinations, memory difficulties, mood swings or suicidal ideation Ophthalmic ROS: negative for - blurry vision, double vision, eye pain or loss of vision ENT ROS: negative for - epistaxis, nasal discharge, oral lesions, sore throat, tinnitus or vertigo Allergy and Immunology ROS: negative for - hives or itchy/watery eyes Hematological and Lymphatic ROS: negative for - bleeding problems, bruising or swollen lymph nodes Endocrine ROS: negative for - galactorrhea, hair pattern changes, polydipsia/polyuria or temperature intolerance Respiratory ROS: negative for - cough, hemoptysis, shortness of breath or wheezing Cardiovascular ROS: negative for - chest pain, dyspnea on exertion, edema or irregular heartbeat Gastrointestinal ROS: negative for - abdominal pain, diarrhea, hematemesis, nausea/vomiting or stool incontinence Genito-Urinary ROS: negative for - dysuria, hematuria, incontinence or  urinary frequency/urgency Musculoskeletal ROS: negative for - joint swelling or muscular weakness Neurological ROS: as noted in HPI Dermatological ROS: negative for rash and skin lesion changes  Physical Examination: Blood pressure 127/62, pulse 67, temperature 98.3 F (36.8 C), temperature source Oral, resp. rate 20, height 6' (1.829 m), weight 148.054 kg (326 lb 6.4 oz), SpO2 98 %.  HEENT-  Normocephalic, no  lesions, without obvious abnormality.  Normal external eye and conjunctiva.  Normal TM's bilaterally.  Normal auditory canals and external ears. Normal external nose, mucus membranes and septum.  Normal pharynx. Cardiovascular- S1, S2 normal, pulses palpable throughout   Lungs- chest clear, no wheezing, rales, normal symmetric air entry Abdomen- soft, non-tender; bowel sounds normal; no masses,  no organomegaly Extremities- no edema Lymph-no adenopathy palpable Musculoskeletal-no joint tenderness, deformity or swelling Skin-warm and dry, no hyperpigmentation, vitiligo, or suspicious lesions  Neurological Examination Mental Status: Alert, oriented, thought content appropriate.  Speech fluent without evidence of aphasia.  Mild dysarthria noted.  Able to follow 3 step commands without difficulty. Cranial Nerves: II: Discs flat bilaterally; Visual fields grossly normal, pupils equal, round, reactive to light and accommodation III,IV, VI: ptosis not present, extra-ocular motions intact bilaterally V,VII: decrease in right NLF, facial light touch sensation normal bilaterally VIII: hearing normal bilaterally IX,X: gag reflex present XI: bilateral shoulder shrug XII: midline tongue extension Motor: Right : Upper extremity   5-/5 With pronator drift   Left:     Upper extremity   5/5  Lower extremity   5-/5       Lower extremity   5/5 Tone and bulk:normal tone throughout; no atrophy noted Sensory: Pinprick and light touch intact throughout, bilaterally Deep Tendon Reflexes: 2+ and symmetric throughout Plantars: Right: upgoing   Left: downgoing Cerebellar: Normal finger-to-nose and normal heel-to-shin testing bilaterally Gait: not tested due to safety concerns    Laboratory Studies:  Basic Metabolic Panel:  Recent Labs Lab 01/15/16 2000  NA 139  K 3.9  CL 103  CO2 26  GLUCOSE 145*  BUN 18  CREATININE 1.06  CALCIUM 9.7    Liver Function Tests:  Recent Labs Lab 01/15/16 2000   AST 39  ALT 44  ALKPHOS 67  BILITOT 0.9  PROT 8.0  ALBUMIN 4.5   No results for input(s): LIPASE, AMYLASE in the last 168 hours. No results for input(s): AMMONIA in the last 168 hours.  CBC:  Recent Labs Lab 01/15/16 2000  WBC 11.3*  NEUTROABS 6.8*  HGB 14.2  HCT 41.4  MCV 85.2  PLT 252    Cardiac Enzymes:  Recent Labs Lab 01/15/16 2000  TROPONINI <0.03    BNP: Invalid input(s): POCBNP  CBG:  Recent Labs Lab 01/16/16 0044 01/16/16 0727 01/16/16 1146  GLUCAP 145* 148* 212*    Microbiology: No results found for this or any previous visit.  Coagulation Studies:  Recent Labs  01/15/16 2000  LABPROT 13.0  INR 0.96    Urinalysis: No results for input(s): COLORURINE, LABSPEC, PHURINE, GLUCOSEU, HGBUR, BILIRUBINUR, KETONESUR, PROTEINUR, UROBILINOGEN, NITRITE, LEUKOCYTESUR in the last 168 hours.  Invalid input(s): APPERANCEUR  Lipid Panel:    Component Value Date/Time   CHOL 220* 01/16/2016 0422   TRIG 280* 01/16/2016 0422   HDL 41 01/16/2016 0422   CHOLHDL 5.4 01/16/2016 0422   VLDL 56* 01/16/2016 0422   LDLCALC 123* 01/16/2016 0422    HgbA1C: No results found for: HGBA1C  Urine Drug Screen:  No results found for: LABOPIA, COCAINSCRNUR, LABBENZ, AMPHETMU, THCU, LABBARB  Alcohol Level: No results for input(s): ETH in the last 168 hours.   Imaging: Ct Angio Head W/cm &/or Wo Cm  01/15/2016  CLINICAL DATA:  55 year old diabetic hypertensive male with hyperlipidemia presenting with falling and right hand and arm weakness/ numbness and slurred speech. Subsequent encounter. EXAM: CT ANGIOGRAPHY HEAD TECHNIQUE: Multidetector CT imaging of the head was performed using the standard protocol during bolus administration of intravenous contrast. Multiplanar CT image reconstructions and MIPs were obtained to evaluate the vascular anatomy. CONTRAST:  75 cc Isovue 370. COMPARISON:  01/15/2016 and 01/06/2015 head CT. FINDINGS: CT HEAD Brain: Question small  acute infarct left corona radiata/posterior limb left internal capsule. No intracranial hemorrhage. No intracranial mass or abnormal enhancement. No hydrocephalus. Calvarium and skull base: No destructive lesion. Paranasal sinuses: Clear Orbits: Exophthalmos. CTA HEAD Anterior circulation: Anterior circulation without medium or large size vessel significant stenosis or occlusion. Mild irregularity M1 segment left middle cerebral artery. Mild branch vessel irregularity. Posterior circulation: Right vertebral artery is small after takeoff of the right posterior inferior cerebellar artery. Slight irregularity basilar artery without significant narrowing. Venous sinuses: Patent. Anatomic variants: Negative Delayed phase:As above. IMPRESSION: CT HEAD Question small acute infarct left corona radiata/posterior limb left internal capsule. No intracranial hemorrhage. No intracranial mass or abnormal enhancement. Exophthalmos. CTA HEAD Anterior circulation without medium or large size vessel significant stenosis or occlusion. Mild irregularity M1 segment left middle cerebral artery. Mild branch vessel irregularity. Right vertebral artery is small after takeoff of the right posterior inferior cerebellar artery. Slight irregularity basilar artery without significant narrowing. Electronically Signed   By: Genia Del M.D.   On: 01/15/2016 22:04   Ct Head Wo Contrast  01/15/2016  CLINICAL DATA:  Paresthesias and weakness in the right face. Onset at 17:45. EXAM: CT HEAD WITHOUT CONTRAST TECHNIQUE: Contiguous axial images were obtained from the base of the skull through the vertex without intravenous contrast. COMPARISON:  04/24/2012, 01/06/2015. FINDINGS: There is no intracranial hemorrhage, mass or evidence of acute infarction. There is no extra-axial fluid collection. Gray matter and white matter appear normal. Cerebral volume is normal for age. Brainstem and posterior fossa are unremarkable. The CSF spaces appear normal.  The bony structures are intact. The visible portions of the paranasal sinuses are clear. The orbits are unremarkable. IMPRESSION: Normal brain. These results were called by telephone at the time of interpretation on 01/15/2016 at 7:52 pm to Dr. Lisa Roca, who verbally acknowledged these results. Electronically Signed   By: Andreas Newport M.D.   On: 01/15/2016 19:55    Assessment: 55 y.o. male presenting with a mild right hemiparesis and difficulties with speech.  Head CT personally reviewed and shows no acute changes.  Patient unable to tolerate MRI.  Echocardiogram shows no cardiac source of emboli with an EF of 60%.  A1c pending, LDL 123.  Patient not compliant with medications at home but does report taking his ASA.  Stroke Risk Factors - diabetes mellitus, hyperlipidemia and hypertension  Plan: 1. HgbA1c, fasting lipid panel 2. MRI of the brain without contrast to be performed in an open machine on an outpatient basis.   3. PT consult, OT consult, Speech consult 4. Carotid dopplers 5. Prophylactic therapy-Antiplatelet med: Plavix - dose 75mg  daily 6. Telemetry monitoring 7. Frequent neuro checks 8. Compliance encouraged.  Target LDL <70.     Alexis Goodell, MD Neurology [phone] 01/16/2016, 1:05 PM

## 2016-01-16 NOTE — ED Notes (Signed)
TRANSFERRED PATIENT TO 1C-ROOM 115

## 2016-01-27 ENCOUNTER — Telehealth: Payer: Self-pay | Admitting: Family Medicine

## 2016-01-27 NOTE — Telephone Encounter (Signed)
Patient has appointment for 02-01-16. He was admitted into the hospital about 2 weeks ago for TIA stroke and they did blood work on him. Would like to know if you are able to use the bloodwork that they ordered to determine his refills on lisinopril, metformin and pravastatin. Patient has balance at [facility] and they will not see him until he pays his bill down (patient is self pay). He just want to know if you are able to use those same labs?

## 2016-02-01 ENCOUNTER — Encounter: Payer: Self-pay | Admitting: Family Medicine

## 2016-02-01 ENCOUNTER — Ambulatory Visit (INDEPENDENT_AMBULATORY_CARE_PROVIDER_SITE_OTHER): Payer: Self-pay | Admitting: Family Medicine

## 2016-02-01 VITALS — BP 132/80 | HR 71 | Temp 98.3°F | Resp 16 | Ht 72.0 in | Wt 319.0 lb

## 2016-02-01 DIAGNOSIS — E1165 Type 2 diabetes mellitus with hyperglycemia: Secondary | ICD-10-CM

## 2016-02-01 DIAGNOSIS — IMO0002 Reserved for concepts with insufficient information to code with codable children: Secondary | ICD-10-CM

## 2016-02-01 DIAGNOSIS — I6930 Unspecified sequelae of cerebral infarction: Secondary | ICD-10-CM

## 2016-02-01 DIAGNOSIS — E1169 Type 2 diabetes mellitus with other specified complication: Secondary | ICD-10-CM

## 2016-02-01 DIAGNOSIS — E785 Hyperlipidemia, unspecified: Secondary | ICD-10-CM

## 2016-02-01 DIAGNOSIS — E118 Type 2 diabetes mellitus with unspecified complications: Secondary | ICD-10-CM

## 2016-02-01 DIAGNOSIS — I10 Essential (primary) hypertension: Secondary | ICD-10-CM

## 2016-02-01 MED ORDER — LISINOPRIL-HYDROCHLOROTHIAZIDE 10-12.5 MG PO TABS: 1.0000 | ORAL_TABLET | Freq: Every day | ORAL | Status: DC

## 2016-02-01 MED ORDER — METFORMIN HCL 1000 MG PO TABS: 1000.0000 mg | ORAL_TABLET | Freq: Two times a day (BID) | ORAL | Status: DC

## 2016-02-01 MED ORDER — ROSUVASTATIN CALCIUM 20 MG PO TABS: 20.0000 mg | ORAL_TABLET | Freq: Every day | ORAL | Status: DC

## 2016-02-01 MED ORDER — SITAGLIPTIN PHOSPHATE 50 MG PO TABS: 50.0000 mg | ORAL_TABLET | Freq: Every day | ORAL | Status: DC

## 2016-02-01 NOTE — Progress Notes (Signed)
Name: Jamie Finley   MRN: [id number]    DOB: 03-12-1961   Date:02/01/2016       Progress Note  Subjective  Chief Complaint  Chief Complaint  Patient presents with  . Medication Refill    plavix 75 mg / lisinopril 10-12.5 mg / metformin 1000 mg / pravastatin 40 mg     Diabetes His disease course has been worsening. Pertinent negatives for hypoglycemia include no headaches. Associated symptoms include blurred vision (vision does seem to be blurred frequently.), polydipsia and polyuria. Pertinent negatives for diabetes include no chest pain. Diabetic complications include a CVA. Risk factors for coronary artery disease include diabetes mellitus. Current diabetic treatment includes oral agent (monotherapy). He monitors blood glucose at home 1-2 x per week. His breakfast blood glucose range is generally 180-200 mg/dl.  Hyperlipidemia This is a chronic problem. The problem is uncontrolled. Recent lipid tests were reviewed and are high. Pertinent negatives include no chest pain, leg pain, myalgias or shortness of breath. Current antihyperlipidemic treatment includes statins.  Hypertension This is a chronic problem. The problem is controlled. Associated symptoms include blurred vision (vision does seem to be blurred frequently.). Pertinent negatives include no chest pain, headaches, palpitations or shortness of breath. Risk factors for coronary artery disease include diabetes mellitus, dyslipidemia, obesity, male gender and stress. Past treatments include ACE inhibitors and diuretics. Hypertensive end-organ damage includes CVA. There is no history of kidney disease.   Recently admitted to [facility] acute stroke, MRI was not done inpatient due to patient's size and body weight. Discharge summary reviewed and discussed with patient in detail. Past Medical History  Diagnosis Date  . Diabetes mellitus without complication ([place])   . Hypertension   . Hyperlipidemia     Past Surgical History  Procedure  Laterality Date  . Back surgery  2007    [facility]  . Tonsillectomy      Family History  Problem Relation Age of Onset  . Cancer Mother     face and jaw  . Cancer Father     kidney    Social History   Social History  . Marital Status: Single    Spouse Name: N/A  . Number of Children: N/A  . Years of Education: N/A   Occupational History  . Not on file.   Social History Main Topics  . Smoking status: Former Smoker    Quit date: 09/11/1989  . Smokeless tobacco: Never Used  . Alcohol Use: No  . Drug Use: No  . Sexual Activity: Not on file   Other Topics Concern  . Not on file   Social History Narrative     Current outpatient prescriptions:  .  albuterol (PROVENTIL HFA;VENTOLIN HFA) 108 (90 Base) MCG/ACT inhaler, Inhale 2 puffs into the lungs every 6 (six) hours as needed. (Patient taking differently: Inhale 2 puffs into the lungs every 6 (six) hours as needed for wheezing or shortness of breath. ), Disp: 1 Inhaler, Rfl: 0 .  aspirin 81 MG tablet, Take 81 mg by mouth daily., Disp: , Rfl:  .  clopidogrel (PLAVIX) 75 MG tablet, Take 1 tablet (75 mg total) by mouth daily., Disp: 30 tablet, Rfl: 2 .  lisinopril-hydrochlorothiazide (PRINZIDE,ZESTORETIC) 10-12.5 MG tablet, Take 1 tablet by mouth daily., Disp: 90 tablet, Rfl: 0 .  metFORMIN (GLUCOPHAGE) 1000 MG tablet, Take 1 tablet (1,000 mg total) by mouth 2 (two) times daily with a meal., Disp: 180 tablet, Rfl: 3 .  pravastatin (PRAVACHOL) 40 MG tablet, Take 1 tablet (40  mg total) by mouth daily., Disp: 90 tablet, Rfl: 0  No Known Allergies   Review of Systems  Eyes: Positive for blurred vision (vision does seem to be blurred frequently.).  Respiratory: Negative for shortness of breath.   Cardiovascular: Negative for chest pain and palpitations.  Musculoskeletal: Negative for myalgias.  Neurological: Negative for headaches.  Endo/Heme/Allergies: Positive for polydipsia.    Objective  Filed Vitals:   02/01/16 0951   BP: 132/80  Pulse: 71  Temp: 98.3 F (36.8 C)  TempSrc: Oral  Resp: 16  Height: 6' (1.829 m)  Weight: 319 lb (144.697 kg)  SpO2: 96%    Physical Exam  Constitutional: He is well-developed, well-nourished, and in no distress.  HENT:  Head: Normocephalic and atraumatic.  Cardiovascular: Normal rate and regular rhythm.   Pulmonary/Chest: Effort normal and breath sounds normal.  Abdominal: Soft. Bowel sounds are normal.  Nursing note and vitals reviewed.      Assessment & Plan  1. Hyperlipidemia associated with type 2 diabetes mellitus ([place]) Abnormal FLP from 01/16/2016 reviewed while he was inpatient. Will change Pravastatin and replace with Crestor 20 mg at bedtime. Recheck FLP in 3 month - rosuvastatin (CRESTOR) 20 MG tablet; Take 1 tablet (20 mg total) by mouth daily.  Dispense: 90 tablet; Refill: 0  2. Essential hypertension Pressure seems to be well controlled, continue on present therapy. - lisinopril-hydrochlorothiazide (PRINZIDE,ZESTORETIC) 10-12.5 MG tablet; Take 1 tablet by mouth daily.  Dispense: 90 tablet; Refill: 0  3. Uncontrolled type 2 diabetes mellitus with complication, without long-term current use of insulin (HCC) A1c is elevated above goal, we will add Januvia 50 mg daily. Metformin that he is already taking. Advised on weight loss. Follow-up in 3-4 months - sitaGLIPtin (JANUVIA) 50 MG tablet; Take 1 tablet (50 mg total) by mouth daily.  Dispense: 90 tablet; Refill: 0 - metFORMIN (GLUCOPHAGE) 1000 MG tablet; Take 1 tablet (1,000 mg total) by mouth 2 (two) times daily with a meal.  Dispense: 180 tablet; Refill: 1  4. Sequelae, post-stroke Patient reports that his speech is gradually improving, as his handwriting. Recommended MRI outpatient but not able to afford at this time because of lack of insurance. We will refer to neurology for further management. Continue on Plavix. - Ambulatory referral to Neurology   [facility] 02/01/2016 10:27 AM

## 2016-02-17 ENCOUNTER — Encounter (INDEPENDENT_AMBULATORY_CARE_PROVIDER_SITE_OTHER): Payer: Self-pay

## 2016-02-17 ENCOUNTER — Encounter: Payer: Self-pay | Admitting: Pharmacist

## 2016-05-03 ENCOUNTER — Encounter: Payer: Self-pay | Admitting: Family Medicine

## 2016-05-03 ENCOUNTER — Ambulatory Visit (INDEPENDENT_AMBULATORY_CARE_PROVIDER_SITE_OTHER): Payer: Self-pay | Admitting: Family Medicine

## 2016-05-03 VITALS — BP 124/82 | HR 72 | Temp 98.7°F | Resp 18 | Ht 72.0 in | Wt 307.4 lb

## 2016-05-03 DIAGNOSIS — E119 Type 2 diabetes mellitus without complications: Secondary | ICD-10-CM

## 2016-05-03 DIAGNOSIS — E785 Hyperlipidemia, unspecified: Secondary | ICD-10-CM

## 2016-05-03 DIAGNOSIS — I10 Essential (primary) hypertension: Secondary | ICD-10-CM

## 2016-05-03 DIAGNOSIS — Z8673 Personal history of transient ischemic attack (TIA), and cerebral infarction without residual deficits: Secondary | ICD-10-CM

## 2016-05-03 LAB — COMPLETE METABOLIC PANEL WITH GFR
ALT: 24 U/L (ref 9–46)
AST: 25 U/L (ref 10–35)
Albumin: 4.1 g/dL (ref 3.6–5.1)
Alkaline Phosphatase: 53 U/L (ref 40–115)
BUN: 19 mg/dL (ref 7–25)
CHLORIDE: 103 mmol/L (ref 98–110)
CO2: 27 mmol/L (ref 20–31)
CREATININE: 1.06 mg/dL (ref 0.70–1.33)
Calcium: 9.5 mg/dL (ref 8.6–10.3)
GFR, Est Non African American: 79 mL/min (ref >=60)
Glucose, Bld: 115 mg/dL — ABNORMAL HIGH (ref 65–99)
POTASSIUM: 4.3 mmol/L (ref 3.5–5.3)
Sodium: 139 mmol/L (ref 135–146)
Total Bilirubin: 0.9 mg/dL (ref 0.2–1.2)
Total Protein: 6.9 g/dL (ref 6.1–8.1)

## 2016-05-03 LAB — LIPID PANEL
Cholesterol: 220 mg/dL — ABNORMAL HIGH (ref 125–200)
HDL: 52 mg/dL (ref >=40)
LDL CALC: 141 mg/dL — AB (ref <130)
TRIGLYCERIDES: 133 mg/dL (ref <150)
Total CHOL/HDL Ratio: 4.2 Ratio (ref <=5.0)
VLDL: 27 mg/dL (ref <30)

## 2016-05-03 LAB — POCT GLYCOSYLATED HEMOGLOBIN (HGB A1C): HEMOGLOBIN A1C: 6.3

## 2016-05-03 LAB — GLUCOSE, POCT (MANUAL RESULT ENTRY): POC Glucose: 120 mg/dl — AB (ref 70–99)

## 2016-05-03 MED ORDER — CLOPIDOGREL BISULFATE 75 MG PO TABS: 75.0000 mg | ORAL_TABLET | Freq: Every day | ORAL | 3 refills | Status: DC

## 2016-05-03 NOTE — Progress Notes (Signed)
Name: Jamie Finley   MRN: [id number]    DOB: September 24, 1960   Date:05/03/2016       Progress Note  Subjective  Chief Complaint  Chief Complaint  Patient presents with  . Diabetes    follow up  . Hypertension  . Hyperlipidemia    has not taken Crestor in 2 months. Gave rapid breathing, dizziness    Diabetes  He presents for his follow-up diabetic visit. He has type 2 diabetes mellitus. His disease course has been stable. There are no hypoglycemic associated symptoms. Pertinent negatives for hypoglycemia include no headaches. Associated symptoms include polydipsia and polyuria. Pertinent negatives for diabetes include no blurred vision, no chest pain and no fatigue. Symptoms are stable. Diabetic complications include a CVA. Pertinent negatives for diabetic complications include no heart disease. Current diabetic treatment includes oral agent (monotherapy). He is following a diabetic diet. He participates in exercise every other day. His breakfast blood glucose range is generally 130-140 mg/dl. An ACE inhibitor/angiotensin II receptor blocker is being taken.  Hypertension  This is a chronic problem. The problem is unchanged. The problem is controlled. Pertinent negatives include no blurred vision, chest pain, headaches, palpitations or shortness of breath. Past treatments include ACE inhibitors and diuretics. Hypertensive end-organ damage includes CVA.  Hyperlipidemia  This is a chronic problem. The problem is uncontrolled. Recent lipid tests were reviewed and are high. Pertinent negatives include no chest pain or shortness of breath. He is currently on no antihyperlipidemic treatment (Stopped Rosuvastatin because it was making him sick was waking up drenched in sweats.).     Past Medical History:  Diagnosis Date  . Diabetes mellitus without complication ([place])   . Hyperlipidemia   . Hypertension     Past Surgical History:  Procedure Laterality Date  . BACK SURGERY  2007   [facility]  .  TONSILLECTOMY      Family History  Problem Relation Age of Onset  . Cancer Mother     face and jaw  . Cancer Father     kidney    Social History   Social History  . Marital status: Single    Spouse name: N/A  . Number of children: N/A  . Years of education: N/A   Occupational History  . Not on file.   Social History Main Topics  . Smoking status: Former Smoker    Quit date: 09/11/1989  . Smokeless tobacco: Never Used  . Alcohol use No  . Drug use: No  . Sexual activity: Not on file   Other Topics Concern  . Not on file   Social History Narrative  . No narrative on file     Current Outpatient Prescriptions:  .  albuterol (PROVENTIL HFA;VENTOLIN HFA) 108 (90 Base) MCG/ACT inhaler, Inhale 2 puffs into the lungs every 6 (six) hours as needed. (Patient taking differently: Inhale 2 puffs into the lungs every 6 (six) hours as needed for wheezing or shortness of breath. ), Disp: 1 Inhaler, Rfl: 0 .  aspirin 81 MG tablet, Take 81 mg by mouth daily., Disp: , Rfl:  .  lisinopril-hydrochlorothiazide (PRINZIDE,ZESTORETIC) 10-12.5 MG tablet, Take 1 tablet by mouth daily., Disp: 90 tablet, Rfl: 0 .  metFORMIN (GLUCOPHAGE) 1000 MG tablet, Take 1 tablet (1,000 mg total) by mouth 2 (two) times daily with a meal., Disp: 180 tablet, Rfl: 1 .  rosuvastatin (CRESTOR) 20 MG tablet, Take 1 tablet (20 mg total) by mouth daily., Disp: 90 tablet, Rfl: 0 .  clopidogrel (PLAVIX) 75 MG tablet,  Take 1 tablet (75 mg total) by mouth daily. (Patient not taking: Reported on 05/03/2016), Disp: 30 tablet, Rfl: 2 .  sitaGLIPtin (JANUVIA) 50 MG tablet, Take 1 tablet (50 mg total) by mouth daily. (Patient not taking: Reported on 05/03/2016), Disp: 90 tablet, Rfl: 0  No Known Allergies   Review of Systems  Constitutional: Negative for fatigue.  Eyes: Negative for blurred vision.  Respiratory: Negative for shortness of breath.   Cardiovascular: Negative for chest pain and palpitations.  Neurological:  Negative for headaches.  Endo/Heme/Allergies: Positive for polydipsia.      Objective  Vitals:   05/03/16 0934  BP: 124/82  Pulse: 72  Resp: 18  Temp: 98.7 F (37.1 C)  TempSrc: Oral  SpO2: 96%  Weight: (!) 307 lb 6.4 oz (139.4 kg)  Height: 6' (1.829 m)    Physical Exam  Constitutional: He is oriented to person, place, and time and well-developed, well-nourished, and in no distress.  Cardiovascular: Normal rate, regular rhythm, S1 normal, S2 normal and normal heart sounds.   No murmur heard. Pulmonary/Chest: Breath sounds normal. He has no wheezes.  Abdominal: Soft. Bowel sounds are normal. There is no tenderness.  Musculoskeletal:       Right ankle: He exhibits swelling.       Left ankle: He exhibits swelling.  Neurological: He is alert and oriented to person, place, and time.  Skin: Skin is warm and dry.  Psychiatric: Mood, memory, affect and judgment normal.  Nursing note and vitals reviewed.    Recent Results (from the past 2160 hour(s))  POCT Glucose (CBG)     Status: Abnormal   Collection Time: 05/03/16  9:38 AM  Result Value Ref Range   POC Glucose 120 (A) 70 - 99 mg/dl  POCT HgB A1C     Status: None   Collection Time: 05/03/16  9:40 AM  Result Value Ref Range   Hemoglobin A1C 6.3      Assessment & Plan  1. Type 2 diabetes mellitus without complication, without long-term current use of insulin (HCC)  A1c 6.3%, consistent with well-controlled diabetes. Continue on metformin at present dosage - POCT HgB A1C - POCT Glucose (CBG) - POCT UA - Microalbumin  2. History of CVA (cerebrovascular accident)  - clopidogrel (PLAVIX) 75 MG tablet; Take 1 tablet (75 mg total) by mouth daily.  Dispense: 90 tablet; Refill: 3  3. Essential hypertension BP stable and controlled on present antihypertensive therapy  4. Hyperlipidemia Abnormal lipid panel in May 2017, has been off Crestor because of side effects. Repeat FLP and restart on appropriate statin therapy -  Lipid Profile - COMPLETE METABOLIC PANEL WITH GFR    Terresa Marlett [facility] 05/03/2016 9:45 AM

## 2016-05-11 ENCOUNTER — Telehealth: Payer: Self-pay

## 2016-05-11 ENCOUNTER — Other Ambulatory Visit: Payer: Self-pay | Admitting: Family Medicine

## 2016-05-11 DIAGNOSIS — Z8673 Personal history of transient ischemic attack (TIA), and cerebral infarction without residual deficits: Secondary | ICD-10-CM

## 2016-05-11 DIAGNOSIS — IMO0002 Reserved for concepts with insufficient information to code with codable children: Secondary | ICD-10-CM

## 2016-05-11 DIAGNOSIS — E1169 Type 2 diabetes mellitus with other specified complication: Secondary | ICD-10-CM

## 2016-05-11 DIAGNOSIS — E785 Hyperlipidemia, unspecified: Secondary | ICD-10-CM

## 2016-05-11 DIAGNOSIS — E118 Type 2 diabetes mellitus with unspecified complications: Secondary | ICD-10-CM

## 2016-05-11 DIAGNOSIS — I10 Essential (primary) hypertension: Secondary | ICD-10-CM

## 2016-05-11 DIAGNOSIS — E1165 Type 2 diabetes mellitus with hyperglycemia: Secondary | ICD-10-CM

## 2016-05-11 MED ORDER — ATORVASTATIN CALCIUM 20 MG PO TABS: 20.0000 mg | ORAL_TABLET | Freq: Every day | ORAL | 0 refills | Status: DC

## 2016-05-11 NOTE — Telephone Encounter (Signed)
Patient has been notified of lab results and a prescription for Atorvastatin 20 mg at bedtime #90 no refills has been sent to [facility] per Dr. Manuella Ghazi, pt has been notified

## 2016-05-25 ENCOUNTER — Encounter: Payer: Self-pay | Admitting: Pharmacist

## 2016-06-12 ENCOUNTER — Encounter (INDEPENDENT_AMBULATORY_CARE_PROVIDER_SITE_OTHER): Payer: Self-pay

## 2016-07-25 ENCOUNTER — Ambulatory Visit (INDEPENDENT_AMBULATORY_CARE_PROVIDER_SITE_OTHER): Payer: Self-pay | Admitting: Family Medicine

## 2016-08-17 ENCOUNTER — Other Ambulatory Visit: Payer: Self-pay | Admitting: Family Medicine

## 2016-08-17 DIAGNOSIS — I10 Essential (primary) hypertension: Secondary | ICD-10-CM

## 2016-08-25 ENCOUNTER — Telehealth: Payer: Self-pay

## 2016-08-25 ENCOUNTER — Ambulatory Visit: Payer: Self-pay | Admitting: Family Medicine

## 2016-08-25 MED ORDER — ATORVASTATIN CALCIUM 20 MG PO TABS: 20.0000 mg | ORAL_TABLET | Freq: Every day | ORAL | 0 refills | Status: DC

## 2016-08-25 NOTE — Telephone Encounter (Signed)
Medication has been refilled and [facility]

## 2016-11-22 ENCOUNTER — Ambulatory Visit (INDEPENDENT_AMBULATORY_CARE_PROVIDER_SITE_OTHER): Payer: Self-pay | Admitting: Family Medicine

## 2016-11-22 ENCOUNTER — Encounter: Payer: Self-pay | Admitting: Family Medicine

## 2016-11-22 VITALS — BP 134/76 | HR 75 | Temp 97.6°F | Resp 18 | Ht 72.0 in | Wt 319.5 lb

## 2016-11-22 DIAGNOSIS — E7800 Pure hypercholesterolemia, unspecified: Secondary | ICD-10-CM

## 2016-11-22 DIAGNOSIS — M25552 Pain in left hip: Secondary | ICD-10-CM | POA: Insufficient documentation

## 2016-11-22 DIAGNOSIS — E119 Type 2 diabetes mellitus without complications: Secondary | ICD-10-CM

## 2016-11-22 DIAGNOSIS — Z8673 Personal history of transient ischemic attack (TIA), and cerebral infarction without residual deficits: Secondary | ICD-10-CM

## 2016-11-22 DIAGNOSIS — I10 Essential (primary) hypertension: Secondary | ICD-10-CM

## 2016-11-22 LAB — COMPLETE METABOLIC PANEL WITH GFR
ALBUMIN: 4.1 g/dL (ref 3.6–5.1)
ALK PHOS: 62 U/L (ref 40–115)
ALT: 17 U/L (ref 9–46)
AST: 17 U/L (ref 10–35)
BUN: 16 mg/dL (ref 7–25)
CHLORIDE: 102 mmol/L (ref 98–110)
CO2: 30 mmol/L (ref 20–31)
CREATININE: 0.99 mg/dL (ref 0.70–1.33)
Calcium: 9.1 mg/dL (ref 8.6–10.3)
GFR, Est African American: >89 mL/min (ref >=60)
GFR, Est Non African American: 85 mL/min (ref >=60)
GLUCOSE: 114 mg/dL — AB (ref 65–99)
Potassium: 4.6 mmol/L (ref 3.5–5.3)
SODIUM: 140 mmol/L (ref 135–146)
Total Bilirubin: 0.8 mg/dL (ref 0.2–1.2)
Total Protein: 7 g/dL (ref 6.1–8.1)

## 2016-11-22 LAB — LIPID PANEL
CHOL/HDL RATIO: 3.3 ratio (ref <5.0)
CHOLESTEROL: 158 mg/dL (ref <200)
HDL: 48 mg/dL (ref >40)
LDL Cholesterol: 76 mg/dL (ref <100)
Triglycerides: 169 mg/dL — ABNORMAL HIGH (ref <150)
VLDL: 34 mg/dL — ABNORMAL HIGH (ref <30)

## 2016-11-22 LAB — POCT GLYCOSYLATED HEMOGLOBIN (HGB A1C): HEMOGLOBIN A1C: 6.5

## 2016-11-22 LAB — GLUCOSE, POCT (MANUAL RESULT ENTRY): POC GLUCOSE: 111 mg/dL — AB (ref 70–99)

## 2016-11-22 MED ORDER — CLOPIDOGREL BISULFATE 75 MG PO TABS: 75.0000 mg | ORAL_TABLET | Freq: Every day | ORAL | 3 refills | Status: DC

## 2016-11-22 MED ORDER — LISINOPRIL 10 MG PO TABS: 10.0000 mg | ORAL_TABLET | Freq: Every day | ORAL | 0 refills | Status: DC

## 2016-11-22 MED ORDER — ATORVASTATIN CALCIUM 20 MG PO TABS
20.0000 mg | ORAL_TABLET | Freq: Every day | ORAL | 0 refills | Status: DC
Start: 2016-11-22 — End: 2017-02-27

## 2016-11-22 MED ORDER — HYDROCHLOROTHIAZIDE 12.5 MG PO CAPS: ORAL_CAPSULE | ORAL | 0 refills | Status: DC

## 2016-11-22 MED ORDER — METFORMIN HCL 1000 MG PO TABS: 1000.0000 mg | ORAL_TABLET | Freq: Two times a day (BID) | ORAL | 0 refills | Status: DC

## 2016-11-22 NOTE — Progress Notes (Signed)
Name: Jamie Finley   MRN: [id number]    DOB: [birth year]   Date:11/22/2016       Progress Note  Subjective  Chief Complaint  Chief Complaint  Patient presents with  . Diabetes  . Hypertension  . Back Pain    radiates to hips for 3 weeks, now using cane to walk, taking ibuprofen    Diabetes  He presents for his follow-up diabetic visit. He has type 2 diabetes mellitus. His disease course has been stable. There are no hypoglycemic associated symptoms. Pertinent negatives for hypoglycemia include no headaches. Associated symptoms include polydipsia and polyuria. Pertinent negatives for diabetes include no blurred vision, no chest pain and no fatigue. Symptoms are stable. Diabetic complications include a CVA. Pertinent negatives for diabetic complications include no heart disease. Current diabetic treatment includes oral agent (monotherapy). He is following a generally unhealthy (cannot afford healthy balanced meals, eats whatever he can afford.) diet. He participates in exercise every other day. His breakfast blood glucose range is generally 110-130 mg/dl. An ACE inhibitor/angiotensin II receptor blocker is being taken.  Hypertension  This is a chronic problem. The problem is unchanged. The problem is controlled. Pertinent negatives include no blurred vision, chest pain, headaches, palpitations or shortness of breath. Past treatments include ACE inhibitors and diuretics. Hypertensive end-organ damage includes CVA. There is no history of kidney disease or CAD/MI.  Hyperlipidemia  This is a chronic problem. The problem is uncontrolled. Recent lipid tests were reviewed and are high. Pertinent negatives include no chest pain or shortness of breath. Current antihyperlipidemic treatment includes statins.     Past Medical History:  Diagnosis Date  . Diabetes mellitus without complication ([place])   . Hyperlipidemia   . Hypertension     Past Surgical History:  Procedure Laterality Date  . BACK  SURGERY  2007   [facility]  . TONSILLECTOMY      Family History  Problem Relation Age of Onset  . Cancer Mother     face and jaw  . Cancer Father     kidney    Social History   Social History  . Marital status: Single    Spouse name: N/A  . Number of children: N/A  . Years of education: N/A   Occupational History  . Not on file.   Social History Main Topics  . Smoking status: Former Smoker    Quit date: 09/11/1989  . Smokeless tobacco: Never Used  . Alcohol use No  . Drug use: No  . Sexual activity: Not on file   Other Topics Concern  . Not on file   Social History Narrative  . No narrative on file     Current Outpatient Prescriptions:  .  albuterol (PROVENTIL HFA;VENTOLIN HFA) 108 (90 Base) MCG/ACT inhaler, Inhale 2 puffs into the lungs every 6 (six) hours as needed. (Patient taking differently: Inhale 2 puffs into the lungs every 6 (six) hours as needed for wheezing or shortness of breath. ), Disp: 1 Inhaler, Rfl: 0 .  aspirin 81 MG tablet, Take 81 mg by mouth daily., Disp: , Rfl:  .  atorvastatin (LIPITOR) 20 MG tablet, Take 1 tablet (20 mg total) by mouth at bedtime., Disp: 90 tablet, Rfl: 0 .  clopidogrel (PLAVIX) 75 MG tablet, TAKE ONE TABLET BY MOUTH EVERY DAY, Disp: 30 tablet, Rfl: 0 .  hydrochlorothiazide (MICROZIDE) 12.5 MG capsule, TAKE ONE TABLET BY MOUTH EVERY DAY, Disp: 90 capsule, Rfl: 0 .  lisinopril (PRINIVIL,ZESTRIL) 10 MG tablet, Take 1 tablet by  mouth daily., Disp: 90 tablet, Rfl: 0 .  metFORMIN (GLUCOPHAGE) 1000 MG tablet, Take 1 tablet (1,000 mg total) by mouth 2 (two) times daily with a meal., Disp: 180 tablet, Rfl: 0  No Known Allergies   Review of Systems  Constitutional: Negative for fatigue.  Eyes: Negative for blurred vision.  Respiratory: Negative for shortness of breath.   Cardiovascular: Negative for chest pain and palpitations.  Neurological: Negative for headaches.  Endo/Heme/Allergies: Positive for polydipsia.     Objective  Vitals:   11/22/16 1004  BP: 134/76  Pulse: 75  Resp: 18  Temp: 97.6 F (36.4 C)  TempSrc: Oral  SpO2: 96%  Weight: (!) 319 lb 8 oz (144.9 kg)  Height: 6' (1.829 m)    Physical Exam  Constitutional: He is oriented to person, place, and time and well-developed, well-nourished, and in no distress.  Cardiovascular: Normal rate, regular rhythm and normal heart sounds.   No murmur heard. Pulmonary/Chest: Breath sounds normal. No respiratory distress.  Abdominal: Soft. Bowel sounds are normal. There is no tenderness.  Musculoskeletal: He exhibits no edema.  1+ pitting edema bilateral LE  Neurological: He is alert and oriented to person, place, and time.  Psychiatric: Mood, memory, affect and judgment normal.  Nursing note and vitals reviewed.      Recent Results (from the past 2160 hour(s))  POCT HgB A1C     Status: None   Collection Time: 11/22/16 10:09 AM  Result Value Ref Range   Hemoglobin A1C 6.5   POCT Glucose (CBG)     Status: Abnormal   Collection Time: 11/22/16 10:10 AM  Result Value Ref Range   POC Glucose 111 (A) 70 - 99 mg/dl     Assessment & Plan  1. Type 2 diabetes mellitus without complication, without long-term current use of insulin (HCC)  A1c 6.5%, well-controlled diabetes, continue on present pharmacotherapy. - POCT HgB A1C - POCT Glucose (CBG) - metFORMIN (GLUCOPHAGE) 1000 MG tablet; Take 1 tablet (1,000 mg total) by mouth 2 (two) times daily with a meal.  Dispense: 180 tablet; Refill: 0 - Urine Microalbumin w/creat. ratio  2. Pure hypercholesterolemia  - atorvastatin (LIPITOR) 20 MG tablet; Take 1 tablet (20 mg total) by mouth at bedtime.  Dispense: 90 tablet; Refill: 0 - Lipid panel - COMPLETE METABOLIC PANEL WITH GFR  3. Essential hypertension  BP stable on present antihypertensive therap - lisinopril (PRINIVIL,ZESTRIL) 10 MG tablet; Take 1 tablet (10 mg total) by mouth daily.  Dispense: 90 tablet; Refill: 0 -  hydrochlorothiazide (MICROZIDE) 12.5 MG capsule; TAKE ONE TABLET BY MOUTH EVERY DAY  Dispense: 90 capsule; Refill: 0  4. Acute pain of left hip Suspect osteoarthritis given that he is overweight, referral to orthopedics - Ambulatory referral to Orthopedic Surgery  5. History of CVA (cerebrovascular accident)  - clopidogrel (PLAVIX) 75 MG tablet; Take 1 tablet (75 mg total) by mouth daily.  Dispense: 90 tablet; Refill: 3  Gretna Bergin Asad A[facility] 11/22/2016 10:15 AM

## 2016-11-24 LAB — MICROALBUMIN / CREATININE URINE RATIO
CREATININE, URINE: 93 mg/dL (ref 20–370)
MICROALB/CREAT RATIO: 4 ug/mg{creat} (ref <30)
Microalb, Ur: 0.4 mg/dL

## 2017-02-27 ENCOUNTER — Encounter: Payer: Self-pay | Admitting: Family Medicine

## 2017-02-27 ENCOUNTER — Ambulatory Visit (INDEPENDENT_AMBULATORY_CARE_PROVIDER_SITE_OTHER): Payer: Self-pay | Admitting: Family Medicine

## 2017-02-27 DIAGNOSIS — M545 Low back pain, unspecified: Secondary | ICD-10-CM | POA: Insufficient documentation

## 2017-02-27 DIAGNOSIS — I10 Essential (primary) hypertension: Secondary | ICD-10-CM

## 2017-02-27 DIAGNOSIS — E7800 Pure hypercholesterolemia, unspecified: Secondary | ICD-10-CM

## 2017-02-27 DIAGNOSIS — G8929 Other chronic pain: Secondary | ICD-10-CM

## 2017-02-27 DIAGNOSIS — E119 Type 2 diabetes mellitus without complications: Secondary | ICD-10-CM

## 2017-02-27 DIAGNOSIS — Z8673 Personal history of transient ischemic attack (TIA), and cerebral infarction without residual deficits: Secondary | ICD-10-CM

## 2017-02-27 LAB — POCT GLYCOSYLATED HEMOGLOBIN (HGB A1C): Hemoglobin A1C: 7.5

## 2017-02-27 LAB — GLUCOSE, POCT (MANUAL RESULT ENTRY): POC Glucose: 128 mg/dl — AB (ref 70–99)

## 2017-02-27 MED ORDER — CLOPIDOGREL BISULFATE 75 MG PO TABS: 75.0000 mg | ORAL_TABLET | Freq: Every day | ORAL | 3 refills | Status: DC

## 2017-02-27 MED ORDER — TIZANIDINE HCL 2 MG PO TABS: 2.0000 mg | ORAL_TABLET | Freq: Every evening | ORAL | 0 refills | Status: DC | PRN

## 2017-02-27 MED ORDER — ATORVASTATIN CALCIUM 20 MG PO TABS: 20.0000 mg | ORAL_TABLET | Freq: Every day | ORAL | 0 refills | Status: DC

## 2017-02-27 MED ORDER — METFORMIN HCL 1000 MG PO TABS: 1000.0000 mg | ORAL_TABLET | Freq: Two times a day (BID) | ORAL | 0 refills | Status: DC

## 2017-02-27 MED ORDER — LISINOPRIL 10 MG PO TABS: 10.0000 mg | ORAL_TABLET | Freq: Every day | ORAL | 0 refills | Status: DC

## 2017-02-27 MED ORDER — HYDROCHLOROTHIAZIDE 12.5 MG PO CAPS: ORAL_CAPSULE | ORAL | 0 refills | Status: DC

## 2017-02-27 NOTE — Progress Notes (Signed)
Name: Jamie Finley   MRN: [id number]    DOB: [birth year]   Date:02/27/2017       Progress Note  Subjective  Chief Complaint  Chief Complaint  Patient presents with  . Medication Refill    Diabetes  He presents for his follow-up diabetic visit. He has type 2 diabetes mellitus. His disease course has been stable. Pertinent negatives for hypoglycemia include no headaches. Associated symptoms include polydipsia and polyuria. Pertinent negatives for diabetes include no blurred vision and no chest pain. Symptoms are stable. Diabetic complications include a CVA. Pertinent negatives for diabetic complications include no heart disease. Current diabetic treatment includes oral agent (monotherapy) and diet. He is following a generally healthy diet. He participates in exercise every other day. He monitors blood glucose at home 1-2 x per week (once every other week). His breakfast blood glucose range is generally 110-130 mg/dl. An ACE inhibitor/angiotensin II receptor blocker is being taken.  Hypertension  This is a chronic problem. The problem is unchanged. The problem is controlled. Pertinent negatives include no blurred vision, chest pain, headaches, palpitations or shortness of breath. Past treatments include ACE inhibitors and diuretics. Hypertensive end-organ damage includes CVA. There is no history of kidney disease or CAD/MI.  Hyperlipidemia  This is a chronic problem. The problem is uncontrolled. Recent lipid tests were reviewed and are high. Pertinent negatives include no chest pain or shortness of breath. Current antihyperlipidemic treatment includes statins.     Past Medical History:  Diagnosis Date  . Diabetes mellitus without complication ([place])   . Hyperlipidemia   . Hypertension     Past Surgical History:  Procedure Laterality Date  . BACK SURGERY  2007   [facility]  . TONSILLECTOMY      Family History  Problem Relation Age of Onset  . Cancer Mother        face and jaw  . Cancer  Father        kidney    Social History   Social History  . Marital status: Single    Spouse name: N/A  . Number of children: N/A  . Years of education: N/A   Occupational History  . Not on file.   Social History Main Topics  . Smoking status: Former Smoker    Quit date: 09/11/1989  . Smokeless tobacco: Never Used  . Alcohol use No  . Drug use: No  . Sexual activity: Not on file   Other Topics Concern  . Not on file   Social History Narrative  . No narrative on file     Current Outpatient Prescriptions:  .  albuterol (PROVENTIL HFA;VENTOLIN HFA) 108 (90 Base) MCG/ACT inhaler, Inhale 2 puffs into the lungs every 6 (six) hours as needed. (Patient taking differently: Inhale 2 puffs into the lungs every 6 (six) hours as needed for wheezing or shortness of breath. ), Disp: 1 Inhaler, Rfl: 0 .  aspirin 81 MG tablet, Take 81 mg by mouth daily., Disp: , Rfl:  .  atorvastatin (LIPITOR) 20 MG tablet, Take 1 tablet (20 mg total) by mouth at bedtime., Disp: 90 tablet, Rfl: 0 .  clopidogrel (PLAVIX) 75 MG tablet, Take 1 tablet (75 mg total) by mouth daily., Disp: 90 tablet, Rfl: 3 .  hydrochlorothiazide (MICROZIDE) 12.5 MG capsule, TAKE ONE TABLET BY MOUTH EVERY DAY, Disp: 90 capsule, Rfl: 0 .  lisinopril (PRINIVIL,ZESTRIL) 10 MG tablet, Take 1 tablet (10 mg total) by mouth daily., Disp: 90 tablet, Rfl: 0 .  metFORMIN (GLUCOPHAGE) 1000 MG  tablet, Take 1 tablet (1,000 mg total) by mouth 2 (two) times daily with a meal., Disp: 180 tablet, Rfl: 0  No Known Allergies   Review of Systems  Eyes: Negative for blurred vision.  Respiratory: Negative for shortness of breath.   Cardiovascular: Negative for chest pain and palpitations.  Neurological: Negative for headaches.  Endo/Heme/Allergies: Positive for polydipsia.      Objective  Vitals:   02/27/17 1614  BP: 133/73  Pulse: 81  Resp: 16  Temp: 98.2 F (36.8 C)  TempSrc: Oral  SpO2: 96%  Weight: (!) 323 lb 6.4 oz (146.7 kg)    Height: 6' (1.829 m)    Physical Exam  Constitutional: He is oriented to person, place, and time and well-developed, well-nourished, and in no distress.  HENT:  Head: Normocephalic and atraumatic.  Cardiovascular: Normal rate, regular rhythm and normal heart sounds.   No murmur heard. Pulmonary/Chest: Effort normal and breath sounds normal. He has no wheezes.  Abdominal: Soft. Bowel sounds are normal. There is no tenderness.  Musculoskeletal:       Lumbar back: He exhibits tenderness, pain and spasm.       Back:  Neurological: He is alert and oriented to person, place, and time.  Psychiatric: Mood, memory, affect and judgment normal.  Nursing note and vitals reviewed.     Assessment & Plan  1. Essential hypertension BP stable on present antihypertensive therapy - lisinopril (PRINIVIL,ZESTRIL) 10 MG tablet; Take 1 tablet (10 mg total) by mouth daily.  Dispense: 90 tablet; Refill: 0 - hydrochlorothiazide (MICROZIDE) 12.5 MG capsule; TAKE ONE TABLET BY MOUTH EVERY DAY  Dispense: 90 capsule; Refill: 0  2. History of CVA (cerebrovascular accident)  - clopidogrel (PLAVIX) 75 MG tablet; Take 1 tablet (75 mg total) by mouth daily.  Dispense: 90 tablet; Refill: 3  3. Pure hypercholesterolemia  - atorvastatin (LIPITOR) 20 MG tablet; Take 1 tablet (20 mg total) by mouth at bedtime.  Dispense: 90 tablet; Refill: 0  4. Type 2 diabetes mellitus without complication, without long-term current use of insulin (HCC)  Point-of-care A1c 7.5%, well-controlled diabetes - metFORMIN (GLUCOPHAGE) 1000 MG tablet; Take 1 tablet (1,000 mg total) by mouth 2 (two) times daily with a meal.  Dispense: 180 tablet; Refill: 0 - POCT HgB A1C - POCT Glucose (CBG)  5. Chronic midline low back pain without sciatica  - tiZANidine (ZANAFLEX) 2 MG tablet; Take 1 tablet (2 mg total) by mouth at bedtime as needed for muscle spasms.  Dispense: 30 tablet; Refill: 0  Laqueisha Catalina Asad A[facility] 02/27/2017 4:22 PM

## 2017-03-01 ENCOUNTER — Telehealth: Payer: Self-pay | Admitting: Family Medicine

## 2017-03-01 NOTE — Telephone Encounter (Signed)
Dr. Sanda Klein, Pt states he was here on Monday and saw Dr Manuella Ghazi and he was supposed to get a RX for a muscle relaxer. Please advise.

## 2017-03-01 NOTE — Telephone Encounter (Signed)
Yes, the medicine list shows a prescription for Zanaflex prescribed by Dr. Manuella Ghazi on 02/27/17 Receipt was confirmed [facility] on 02/27/17 at 4:33 pm He should check with his pharmacy If they do not have the prescritpion (verify with them), you can call it in under his name; if he wants another pharmacy, verify not filled at [facility], cancel that out, and then call it in to another pharmacy

## 2017-03-01 NOTE — Telephone Encounter (Signed)
Pt called back and I read the message to him. He understands to call our office back tomorrow if the RX is not at the pharmacy.

## 2017-03-01 NOTE — Telephone Encounter (Signed)
[facility] and they are not able to get the medication for him. He would like to know if it is possible if you could sent it to [facility].  [phone]

## 2017-03-01 NOTE — Telephone Encounter (Signed)
Pt states he was here on Monday and saw Dr Manuella Ghazi and he was supposed to get a RX for a muscle relaxer. Please advise.

## 2017-03-30 ENCOUNTER — Telehealth: Payer: Self-pay | Admitting: Pharmacy Technician

## 2017-03-30 NOTE — Telephone Encounter (Signed)
Patient eligible to receive medication assistance at [facility] through 2018, as long as eligibility requirements continue to be met.  Apryl Brymer J. Sharen Youngren Care Manager [facility] 

## 2017-06-12 ENCOUNTER — Other Ambulatory Visit: Payer: Self-pay | Admitting: Family Medicine

## 2017-06-12 DIAGNOSIS — I10 Essential (primary) hypertension: Secondary | ICD-10-CM

## 2017-06-12 DIAGNOSIS — E119 Type 2 diabetes mellitus without complications: Secondary | ICD-10-CM

## 2017-06-12 DIAGNOSIS — E7800 Pure hypercholesterolemia, unspecified: Secondary | ICD-10-CM

## 2017-06-13 ENCOUNTER — Ambulatory Visit (INDEPENDENT_AMBULATORY_CARE_PROVIDER_SITE_OTHER): Payer: Self-pay | Admitting: Family Medicine

## 2017-06-13 ENCOUNTER — Encounter: Payer: Self-pay | Admitting: Family Medicine

## 2017-06-13 VITALS — BP 162/92 | HR 95 | Ht 72.0 in | Wt 323.6 lb

## 2017-06-13 DIAGNOSIS — E7800 Pure hypercholesterolemia, unspecified: Secondary | ICD-10-CM

## 2017-06-13 DIAGNOSIS — IMO0002 Reserved for concepts with insufficient information to code with codable children: Secondary | ICD-10-CM

## 2017-06-13 DIAGNOSIS — E118 Type 2 diabetes mellitus with unspecified complications: Secondary | ICD-10-CM

## 2017-06-13 DIAGNOSIS — I10 Essential (primary) hypertension: Secondary | ICD-10-CM

## 2017-06-13 DIAGNOSIS — E1165 Type 2 diabetes mellitus with hyperglycemia: Secondary | ICD-10-CM

## 2017-06-13 DIAGNOSIS — Z8673 Personal history of transient ischemic attack (TIA), and cerebral infarction without residual deficits: Secondary | ICD-10-CM

## 2017-06-13 DIAGNOSIS — R5383 Other fatigue: Secondary | ICD-10-CM

## 2017-06-13 LAB — POCT GLYCOSYLATED HEMOGLOBIN (HGB A1C): HEMOGLOBIN A1C: 7.9

## 2017-06-13 MED ORDER — HYDROCHLOROTHIAZIDE 12.5 MG PO CAPS: ORAL_CAPSULE | ORAL | 0 refills | Status: DC

## 2017-06-13 MED ORDER — LISINOPRIL 10 MG PO TABS: 10.0000 mg | ORAL_TABLET | Freq: Every day | ORAL | 0 refills | Status: DC

## 2017-06-13 MED ORDER — ATORVASTATIN CALCIUM 20 MG PO TABS: 20.0000 mg | ORAL_TABLET | Freq: Every day | ORAL | 0 refills | Status: DC

## 2017-06-13 MED ORDER — METFORMIN HCL 1000 MG PO TABS: 1000.0000 mg | ORAL_TABLET | Freq: Two times a day (BID) | ORAL | 0 refills | Status: DC

## 2017-06-13 NOTE — Progress Notes (Signed)
Name: Jamie Finley   MRN: [id number]    DOB: [birth year]   Date:06/13/2017       Progress Note  Subjective  Chief Complaint  Chief Complaint  Patient presents with  . Hyperlipidemia  . Hypertension  . Diabetes    Pt states that he has not been able to check sugar, due to not having supplies   . Immunizations    declines flu vaccine   . Headache    PT states that he has not had BP medication in a week     Hyperlipidemia  This is a chronic problem. The problem is uncontrolled. Recent lipid tests were reviewed and are high. Pertinent negatives include no chest pain, leg pain, myalgias or shortness of breath. (Pt. has noticed more frequent muscle cramps especially at night, possible statin side effect?) Current antihyperlipidemic treatment includes statins.  Hypertension  This is a chronic problem. The problem is unchanged. The problem is controlled. Pertinent negatives include no blurred vision, chest pain, palpitations or shortness of breath. Headaches: frequent headaches in last month  Past treatments include ACE inhibitors and diuretics. Hypertensive end-organ damage includes CVA. There is no history of kidney disease or CAD/MI.  Diabetes  He presents for his follow-up diabetic visit. He has type 2 diabetes mellitus. His disease course has been worsening. There are no hypoglycemic associated symptoms. Headaches: frequent headaches in last month  Associated symptoms include foot paresthesias. Pertinent negatives for diabetes include no blurred vision, no chest pain, no polydipsia and no polyuria. Symptoms are stable. Diabetic complications include a CVA and peripheral neuropathy. Pertinent negatives for diabetic complications include no heart disease. Current diabetic treatment includes oral agent (monotherapy) and diet. He is following a generally healthy diet. He participates in exercise every other day. He monitors blood glucose at home 1-2 x per week. His breakfast blood glucose range is  generally 110-130 mg/dl. An ACE inhibitor/angiotensin II receptor blocker is being taken.   Fatigue: Patient is concerned about worsening fatigue and lethary, described as not having energy 'to do anything', he feels tired all the time. 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 He denies any dyspnea, blood in urine or stool, nausea or vomiting, no loss of appetitie but sometimes finds it harder to sleep.    Past Medical History:  Diagnosis Date  . Diabetes mellitus without complication ([place])   . Hyperlipidemia   . Hypertension     Past Surgical History:  Procedure Laterality Date  . BACK SURGERY  2007   [facility]  . TONSILLECTOMY      Family History  Problem Relation Age of Onset  . Cancer Mother        face and jaw  . Cancer Father        kidney    Social History   Social History  . Marital status: Single    Spouse name: N/A  . Number of children: N/A  . Years of education: N/A   Occupational History  . Not on file.   Social History Main Topics  . Smoking status: Former Smoker    Quit date: 09/11/1989  . Smokeless tobacco: Never Used  . Alcohol use No  . Drug use: No  . Sexual activity: Not on file   Other Topics Concern  . Not on file   Social History Narrative  . No narrative on file     Current Outpatient Prescriptions:  .  albuterol (PROVENTIL HFA;VENTOLIN HFA) 108 (90 Base)  MCG/ACT inhaler, Inhale 2 puffs into the lungs every 6 (six) hours as needed. (Patient not taking: Reported on 06/13/2017), Disp: 1 Inhaler, Rfl: 0 .  aspirin 81 MG tablet, Take 81 mg by mouth daily., Disp: , Rfl:  .  atorvastatin (LIPITOR) 20 MG tablet, Take 1 tablet (20 mg total) by mouth at bedtime. (Patient not taking: Reported on 06/13/2017), Disp: 90 tablet, Rfl: 0 .  clopidogrel (PLAVIX) 75 MG tablet, Take 1 tablet (75 mg total) by mouth daily. (Patient not taking: Reported on 06/13/2017), Disp: 90 tablet, Rfl: 3 .  hydrochlorothiazide (MICROZIDE) 12.5  MG capsule, TAKE ONE TABLET BY MOUTH EVERY DAY (Patient not taking: Reported on 06/13/2017), Disp: 90 capsule, Rfl: 0 .  lisinopril (PRINIVIL,ZESTRIL) 10 MG tablet, Take 1 tablet (10 mg total) by mouth daily. (Patient not taking: Reported on 06/13/2017), Disp: 90 tablet, Rfl: 0 .  metFORMIN (GLUCOPHAGE) 1000 MG tablet, Take 1 tablet (1,000 mg total) by mouth 2 (two) times daily with a meal. (Patient not taking: Reported on 06/13/2017), Disp: 180 tablet, Rfl: 0  No Known Allergies   Review of Systems  Eyes: Negative for blurred vision.  Respiratory: Negative for shortness of breath.   Cardiovascular: Negative for chest pain and palpitations.  Musculoskeletal: Negative for myalgias.  Neurological: Headaches: frequent headaches in last month   Endo/Heme/Allergies: Negative for polydipsia.    Objective  Vitals:   06/13/17 1420  BP: (!) 162/92  Pulse: 95  SpO2: 98%  Weight: (!) 323 lb 9.6 oz (146.8 kg)  Height: 6' (1.829 m)    Physical Exam  Constitutional: He is oriented to person, place, and time and well-developed, well-nourished, and in no distress.  HENT:  Head: Normocephalic and atraumatic.  Cardiovascular: Normal rate, regular rhythm and normal heart sounds.   Pulmonary/Chest: Effort normal and breath sounds normal. He has no wheezes.  Abdominal: Soft. Bowel sounds are normal. There is no tenderness.  Musculoskeletal: He exhibits edema.  Neurological: He is alert and oriented to person, place, and time.  Psychiatric: Mood, memory, affect and judgment normal.  Nursing note and vitals reviewed.    Assessment & Plan  1. Diabetes mellitus type 2, uncontrolled, with complications ([place]) Point of care A1c 7.9%, marginally worse from 7.5%, advised on dietary and lifestyle changes for now including weight loss. Continue on metformin. - POCT glycosylated hemoglobin (Hb A1C) - metFORMIN (GLUCOPHAGE) 1000 MG tablet; Take 1 tablet (1,000 mg total) by mouth 2 (two) times daily with a  meal.  Dispense: 180 tablet; Refill: 0  2. Essential hypertension BP is elevated, patient has not taken his blood pressure medicines in the few days because he ran out, refills provided and reassess in 3 months - hydrochlorothiazide (MICROZIDE) 12.5 MG capsule; TAKE ONE TABLET BY MOUTH EVERY DAY  Dispense: 90 capsule; Refill: 0 - lisinopril (PRINIVIL,ZESTRIL) 10 MG tablet; Take 1 tablet (10 mg total) by mouth daily.  Dispense: 90 tablet; Refill: 0  3. History of CVA (cerebrovascular accident)   4. Pure hypercholesterolemia Obtain FLP, just statin if appropriate - atorvastatin (LIPITOR) 20 MG tablet; Take 1 tablet (20 mg total) by mouth at bedtime.  Dispense: 90 tablet; Refill: 0 - Lipid panel - COMPLETE METABOLIC PANEL WITH GFR  5. Lethargy Lab work to determine underlying etiology for lethargy - CBC with Differential/Platelet - TSH - VITAMIN D 25 Hydroxy (Vit-D Deficiency, Fractures) - B12   Faustine Tates Asad A. [facility] 06/13/2017 2:43 PM

## 2017-06-14 LAB — COMPLETE METABOLIC PANEL WITH GFR
AG Ratio: 1.5 (calc) (ref 1.0–2.5)
ALBUMIN MSPROF: 4.1 g/dL (ref 3.6–5.1)
ALKALINE PHOSPHATASE (APISO): 76 U/L (ref 40–115)
ALT: 19 U/L (ref 9–46)
AST: 17 U/L (ref 10–35)
BUN: 14 mg/dL (ref 7–25)
CALCIUM: 9.2 mg/dL (ref 8.6–10.3)
CO2: 27 mmol/L (ref 20–32)
CREATININE: 0.92 mg/dL (ref 0.70–1.33)
Chloride: 104 mmol/L (ref 98–110)
GFR, EST AFRICAN AMERICAN: 107 mL/min/{1.73_m2} (ref >=60)
GFR, EST NON AFRICAN AMERICAN: 93 mL/min/{1.73_m2} (ref >=60)
GLOBULIN: 2.7 g/dL (ref 1.9–3.7)
GLUCOSE: 101 mg/dL — AB (ref 65–99)
Potassium: 4.2 mmol/L (ref 3.5–5.3)
Sodium: 139 mmol/L (ref 135–146)
TOTAL PROTEIN: 6.8 g/dL (ref 6.1–8.1)
Total Bilirubin: 0.8 mg/dL (ref 0.2–1.2)

## 2017-06-14 LAB — TSH: TSH: 4.4 m[IU]/L (ref 0.40–4.50)

## 2017-06-14 LAB — CBC WITH DIFFERENTIAL/PLATELET
BASOS ABS: 58 {cells}/uL (ref 0–200)
Basophils Relative: 0.7 %
EOS PCT: 1.4 %
Eosinophils Absolute: 116 cells/uL (ref 15–500)
HCT: 37.6 % — ABNORMAL LOW (ref 38.5–50.0)
HEMOGLOBIN: 13.1 g/dL — AB (ref 13.2–17.1)
Lymphs Abs: 2556 cells/uL (ref 850–3900)
MCH: 29.4 pg (ref 27.0–33.0)
MCHC: 34.8 g/dL (ref 32.0–36.0)
MCV: 84.5 fL (ref 80.0–100.0)
MONOS PCT: 7.9 %
MPV: 10.1 fL (ref 7.5–12.5)
NEUTROS ABS: 4914 {cells}/uL (ref 1500–7800)
Neutrophils Relative %: 59.2 %
PLATELETS: 268 10*3/uL (ref 140–400)
RBC: 4.45 10*6/uL (ref 4.20–5.80)
RDW: 13.1 % (ref 11.0–15.0)
TOTAL LYMPHOCYTE: 30.8 %
WBC mixed population: 656 cells/uL (ref 200–950)
WBC: 8.3 10*3/uL (ref 3.8–10.8)

## 2017-06-14 LAB — VITAMIN D 25 HYDROXY (VIT D DEFICIENCY, FRACTURES): VIT D 25 HYDROXY: 62 ng/mL (ref 30–100)

## 2017-06-14 LAB — LIPID PANEL
CHOLESTEROL: 230 mg/dL — AB (ref <200)
HDL: 38 mg/dL — ABNORMAL LOW (ref >40)
LDL Cholesterol (Calc): 151 mg/dL (calc) — ABNORMAL HIGH
Non-HDL Cholesterol (Calc): 192 mg/dL (calc) — ABNORMAL HIGH (ref <130)
Total CHOL/HDL Ratio: 6.1 (calc) — ABNORMAL HIGH (ref <5.0)
Triglycerides: 251 mg/dL — ABNORMAL HIGH (ref <150)

## 2017-06-14 LAB — VITAMIN B12: VITAMIN B 12: 351 pg/mL (ref 200–1100)

## 2017-06-15 ENCOUNTER — Telehealth: Payer: Self-pay

## 2017-06-15 DIAGNOSIS — E785 Hyperlipidemia, unspecified: Secondary | ICD-10-CM

## 2017-06-15 MED ORDER — ROSUVASTATIN CALCIUM 20 MG PO TABS: 20.0000 mg | ORAL_TABLET | Freq: Every day | ORAL | 0 refills | Status: DC

## 2017-06-15 NOTE — Telephone Encounter (Signed)
-----   Message from Roselee Nova, MD sent at 06/14/2017  2:22 PM EDT ----- CBC: Normal white count,  marginally low hemoglobin and hematocrit at 13.1 and 37.6%, normal platelets TSH within normal range  Vitamin D is within normal range  B12 is within normal range  Fasting lipid panel: Elevated total cholesterol, triglycerides and LDL cholesterol, HDL is below normal.he is on atorvastatin 20 mg at bedtime and should switch to a higher intensity statin such as Crestor 20 mg at bedtime. Please confirm and send a prescription for Crestor 20 mg by mouth daily at bedtime #90 #0 refills to his pharmacy  CMP: Elevated glucose at 10 1 mg/dL, normal electrolytes, kidney function and liver enzymes

## 2017-06-15 NOTE — Telephone Encounter (Signed)
Called pt informed him of results below. Informed him of the need to begin new medication. Pt gave verbal understanding. New RX sent in.

## 2017-07-17 ENCOUNTER — Ambulatory Visit (INDEPENDENT_AMBULATORY_CARE_PROVIDER_SITE_OTHER): Payer: Self-pay | Admitting: Family Medicine

## 2017-07-17 ENCOUNTER — Encounter: Payer: Self-pay | Admitting: Family Medicine

## 2017-07-17 VITALS — BP 136/78 | HR 87 | Temp 99.0°F | Resp 16 | Ht 72.0 in | Wt 326.7 lb

## 2017-07-17 DIAGNOSIS — Z125 Encounter for screening for malignant neoplasm of prostate: Secondary | ICD-10-CM

## 2017-07-17 DIAGNOSIS — Z0000 Encounter for general adult medical examination without abnormal findings: Secondary | ICD-10-CM

## 2017-07-17 DIAGNOSIS — Z1159 Encounter for screening for other viral diseases: Secondary | ICD-10-CM

## 2017-07-17 DIAGNOSIS — Z1211 Encounter for screening for malignant neoplasm of colon: Secondary | ICD-10-CM

## 2017-07-17 NOTE — Progress Notes (Signed)
Name: Jamie Finley   MRN: [id number]    DOB: 12-20-1960   Date:07/17/2017       Progress Note  Subjective  Chief Complaint  Chief Complaint  Patient presents with  . Annual Exam    HPI  Pt. Presents for Complete Physical. He is due for colon cancer screening, prostate cancer screening and Hep C screening.   Past Medical History:  Diagnosis Date  . Diabetes mellitus without complication ([place])   . Hyperlipidemia   . Hypertension     Past Surgical History:  Procedure Laterality Date  . BACK SURGERY  2007   [facility]  . TONSILLECTOMY      Family History  Problem Relation Age of Onset  . Cancer Mother        face and jaw  . Cancer Father        kidney    Social History   Socioeconomic History  . Marital status: Single    Spouse name: Not on file  . Number of children: Not on file  . Years of education: Not on file  . Highest education level: Not on file  Social Needs  . Financial resource strain: Not on file  . Food insecurity - worry: Not on file  . Food insecurity - inability: Not on file  . Transportation needs - medical: Not on file  . Transportation needs - non-medical: Not on file  Occupational History  . Not on file  Tobacco Use  . Smoking status: Former Smoker    Last attempt to quit: 09/11/1989    Years since quitting: 27.8  . Smokeless tobacco: Never Used  Substance and Sexual Activity  . Alcohol use: No  . Drug use: No  . Sexual activity: Not Currently  Other Topics Concern  . Not on file  Social History Narrative  . Not on file     Current Outpatient Medications:  .  aspirin 81 MG tablet, Take 81 mg by mouth daily., Disp: , Rfl:  .  clopidogrel (PLAVIX) 75 MG tablet, Take 1 tablet (75 mg total) by mouth daily., Disp: 90 tablet, Rfl: 3 .  hydrochlorothiazide (MICROZIDE) 12.5 MG capsule, TAKE ONE TABLET BY MOUTH EVERY DAY, Disp: 90 capsule, Rfl: 0 .  lisinopril (PRINIVIL,ZESTRIL) 10 MG tablet, Take 1 tablet (10 mg total) by mouth daily.,  Disp: 90 tablet, Rfl: 0 .  metFORMIN (GLUCOPHAGE) 1000 MG tablet, Take 1 tablet (1,000 mg total) by mouth 2 (two) times daily with a meal., Disp: 180 tablet, Rfl: 0 .  rosuvastatin (CRESTOR) 20 MG tablet, Take 1 tablet (20 mg total) by mouth at bedtime., Disp: 90 tablet, Rfl: 0 .  albuterol (PROVENTIL HFA;VENTOLIN HFA) 108 (90 Base) MCG/ACT inhaler, Inhale 2 puffs into the lungs every 6 (six) hours as needed. (Patient not taking: Reported on 07/17/2017), Disp: 1 Inhaler, Rfl: 0  No Known Allergies   Review of Systems  Constitutional: Positive for malaise/fatigue. Negative for chills, fever and weight loss.  HENT: Negative for congestion, ear pain and sore throat.   Eyes: Positive for blurred vision (after he has been staring at a screen for long time). Negative for double vision.  Respiratory: Positive for cough. Negative for shortness of breath and wheezing.   Cardiovascular: Negative for chest pain, palpitations (occasional palpitation which resolves. ) and leg swelling.  Gastrointestinal: Negative for abdominal pain, blood in stool, constipation, diarrhea, nausea and vomiting.  Genitourinary: Negative for dysuria, hematuria and urgency.  Musculoskeletal: Negative for back pain and neck pain.  Skin: Negative for rash.  Neurological: Positive for headaches. Negative for dizziness.  Psychiatric/Behavioral: Positive for depression. The patient is nervous/anxious. The patient does not have insomnia.     Objective  Vitals:   07/17/17 1045  BP: 136/78  Pulse: 87  Resp: 16  Temp: 99 F (37.2 C)  TempSrc: Oral  SpO2: 95%  Weight: (!) 326 lb 11.2 oz (148.2 kg)  Height: 6' (1.829 m)    Physical Exam  Constitutional: He is oriented to person, place, and time and well-developed, well-nourished, and in no distress.  HENT:  Head: Normocephalic and atraumatic.  Right Ear: External ear normal.  Left Ear: External ear normal.  Mouth/Throat: Oropharynx is clear and moist.  Eyes: Pupils are  equal, round, and reactive to light.  Neck: Neck supple. No thyromegaly present.  Cardiovascular: Normal rate, regular rhythm and normal heart sounds.  No murmur heard. Pulmonary/Chest: Effort normal and breath sounds normal. He has no wheezes.  Abdominal: Soft. Bowel sounds are normal. There is no tenderness.  Genitourinary: Rectum normal and prostate normal. Prostate is not enlarged and not tender.  Musculoskeletal: He exhibits edema.  Neurological: He is alert and oriented to person, place, and time.  Skin: Skin is warm and dry.  Psychiatric: Mood, memory, affect and judgment normal.  Nursing note and vitals reviewed.     Assessment & Plan  1. Annual physical exam Obtain age-appropriate laboratory screenings - CBC with Differential/Platelet - TSH - VITAMIN D 25 Hydroxy (Vit-D Deficiency, Fractures)  2. Need for hepatitis C screening test  - Hepatitis C antibody  3. Screening for colon cancer  - Cologuard  4. Screening for prostate cancer  - PSA   Zeppelin Beckstrand Asad A. [facility] 07/17/2017 10:56 AM

## 2017-07-18 LAB — CBC WITH DIFFERENTIAL/PLATELET
BASOS ABS: 41 {cells}/uL (ref 0–200)
BASOS PCT: 0.5 %
EOS ABS: 113 {cells}/uL (ref 15–500)
Eosinophils Relative: 1.4 %
HCT: 38.5 % (ref 38.5–50.0)
Hemoglobin: 13.2 g/dL (ref 13.2–17.1)
Lymphs Abs: 2705 cells/uL (ref 850–3900)
MCH: 28.6 pg (ref 27.0–33.0)
MCHC: 34.3 g/dL (ref 32.0–36.0)
MCV: 83.3 fL (ref 80.0–100.0)
MONOS PCT: 7.9 %
MPV: 10.1 fL (ref 7.5–12.5)
NEUTROS PCT: 56.8 %
Neutro Abs: 4601 cells/uL (ref 1500–7800)
PLATELETS: 240 10*3/uL (ref 140–400)
RBC: 4.62 10*6/uL (ref 4.20–5.80)
RDW: 13 % (ref 11.0–15.0)
TOTAL LYMPHOCYTE: 33.4 %
WBC: 8.1 10*3/uL (ref 3.8–10.8)
WBCMIX: 640 {cells}/uL (ref 200–950)

## 2017-07-18 LAB — HEPATITIS C ANTIBODY
Hepatitis C Ab: NONREACTIVE
SIGNAL TO CUT-OFF: 0.03 (ref <1.00)

## 2017-07-18 LAB — VITAMIN D 25 HYDROXY (VIT D DEFICIENCY, FRACTURES): Vit D, 25-Hydroxy: 63 ng/mL (ref 30–100)

## 2017-07-18 LAB — TSH: TSH: 4.22 m[IU]/L (ref 0.40–4.50)

## 2017-07-18 LAB — PSA: PSA: 0.5 ng/mL (ref <=4.0)

## 2017-08-09 ENCOUNTER — Ambulatory Visit: Payer: Self-pay | Admitting: Family Medicine

## 2017-08-24 ENCOUNTER — Ambulatory Visit (INDEPENDENT_AMBULATORY_CARE_PROVIDER_SITE_OTHER): Payer: Self-pay | Admitting: Family Medicine

## 2017-08-24 ENCOUNTER — Encounter: Payer: Self-pay | Admitting: Family Medicine

## 2017-08-24 DIAGNOSIS — M545 Low back pain, unspecified: Secondary | ICD-10-CM

## 2017-08-24 DIAGNOSIS — E1165 Type 2 diabetes mellitus with hyperglycemia: Secondary | ICD-10-CM

## 2017-08-24 DIAGNOSIS — F3340 Major depressive disorder, recurrent, in remission, unspecified: Secondary | ICD-10-CM | POA: Insufficient documentation

## 2017-08-24 DIAGNOSIS — M25561 Pain in right knee: Secondary | ICD-10-CM | POA: Insufficient documentation

## 2017-08-24 DIAGNOSIS — E785 Hyperlipidemia, unspecified: Secondary | ICD-10-CM

## 2017-08-24 DIAGNOSIS — F338 Other recurrent depressive disorders: Secondary | ICD-10-CM

## 2017-08-24 DIAGNOSIS — G8929 Other chronic pain: Secondary | ICD-10-CM

## 2017-08-24 DIAGNOSIS — E118 Type 2 diabetes mellitus with unspecified complications: Secondary | ICD-10-CM

## 2017-08-24 DIAGNOSIS — IMO0002 Reserved for concepts with insufficient information to code with codable children: Secondary | ICD-10-CM

## 2017-08-24 DIAGNOSIS — Z8673 Personal history of transient ischemic attack (TIA), and cerebral infarction without residual deficits: Secondary | ICD-10-CM

## 2017-08-24 DIAGNOSIS — M25562 Pain in left knee: Secondary | ICD-10-CM

## 2017-08-24 DIAGNOSIS — E1143 Type 2 diabetes mellitus with diabetic autonomic (poly)neuropathy: Secondary | ICD-10-CM

## 2017-08-24 DIAGNOSIS — E1169 Type 2 diabetes mellitus with other specified complication: Secondary | ICD-10-CM

## 2017-08-24 DIAGNOSIS — F331 Major depressive disorder, recurrent, moderate: Secondary | ICD-10-CM | POA: Insufficient documentation

## 2017-08-24 NOTE — Assessment & Plan Note (Signed)
Tolerable control using OTC medication.

## 2017-08-24 NOTE — Assessment & Plan Note (Signed)
Poor control despite crestor. Tolerable SE.

## 2017-08-24 NOTE — Patient Instructions (Addendum)
Set up yearly eye exam.  Can you tell him cost of scissors removal of skin tags.  Work on low carb low cholesterol diet, increase exercise as tolerated.

## 2017-08-24 NOTE — Assessment & Plan Note (Signed)
Needs to lose weight

## 2017-08-24 NOTE — Assessment & Plan Note (Signed)
On plavix anticoagulation.

## 2017-08-24 NOTE — Assessment & Plan Note (Signed)
Encouraged exercise, weight loss, healthy eating habits. ? ?

## 2017-08-24 NOTE — Progress Notes (Signed)
Subjective:    Patient ID: Jamie Finley, male    DOB: 1961/07/23, 56 y.o.   MRN: [id number]  HPI  56 year old male presents to establish care.  Previous MD Dr. Manuella Ghazi  Last saw him 06/2017  Hypertension:    Well controlled on HCTZ and lisinopril BP Readings from Last 3 Encounters:  08/24/17 136/78  07/17/17 136/78  06/13/17 (!) 162/92  Using medication without problems or lightheadedness:  none Chest pain with exertion: none Edema: none Short of breath: none Average home BPs: good control. Other issues: Hx of stroke 2 years ago.. Started on clopidogrel and aspirin Had rehab for right arm weakness and slurred speech.. Now resolved.   Elevated Cholesterol:  Poor control LDL 151 last check.. Needs to be at goal < 70.   On crestor 20 mg daily  Lab Results  Component Value Date   CHOL 230 (H) 06/13/2017   HDL 38 (L) 06/13/2017   LDLCALC 76 11/22/2016   TRIG 251 (H) 06/13/2017   CHOLHDL 6.1 (H) 06/13/2017  Using medications without problems: Muscle aches:  Diet compliance: poor, " I cannot afford healthy foods" Exercise: poor Other complaints:  Body mass index is 43.59 kg/m.    Diabetes:   Inadequate control on metformin 2 tabs daily. Lab Results  Component Value Date   HGBA1C 7.9 06/13/2017  Using medications without difficulties: Hypoglycemic episodes: Hyperglycemic episodes: Feet problems: Blood Sugars averaging: eye exam within last year:     Chronic low back pain: Hx of ruptured disc.  This limits his job opportunities. He has been feeling sad, depressed during the holidays given no job, cannot work. No SI.   Skin tag irritated and painful on right buttock.. Wants it removed.  Social History /Family History/Past Medical History reviewed in detail and updated in EMR if needed. Blood pressure 136/78, pulse 70, temperature 98.5 F (36.9 C), temperature source Oral, height 6' (1.829 m), weight (!) 321 lb 6.4 oz (145.8 kg), SpO2 95 %.   Review of Systems   Constitutional: Positive for fatigue. Negative for fever.  HENT: Negative for ear pain.   Eyes: Negative for pain.  Respiratory: Negative for cough and shortness of breath.   Cardiovascular: Negative for chest pain, palpitations and leg swelling.  Gastrointestinal: Negative for abdominal pain.  Genitourinary: Negative for dysuria.  Musculoskeletal: Negative for arthralgias.  Neurological: Negative for syncope, light-headedness and headaches.  Psychiatric/Behavioral: Positive for dysphoric mood.       Objective:   Physical Exam  Constitutional: He appears well-developed and well-nourished.  Non-toxic appearance. He does not appear ill. No distress.  Morbid obesity  HENT:  Head: Normocephalic and atraumatic.  Right Ear: Hearing, tympanic membrane, external ear and ear canal normal.  Left Ear: Hearing, tympanic membrane, external ear and ear canal normal.  Nose: Nose normal.  Mouth/Throat: Uvula is midline, oropharynx is clear and moist and mucous membranes are normal.  Eyes: Conjunctivae, EOM and lids are normal. Pupils are equal, round, and reactive to light. Lids are everted and swept, no foreign bodies found.  Neck: Trachea normal, normal range of motion and phonation normal. Neck supple. Carotid bruit is not present. No thyroid mass and no thyromegaly present.  Cardiovascular: Normal rate, regular rhythm, S1 normal, S2 normal, intact distal pulses and normal pulses. Exam reveals no gallop.  No murmur heard. Pulmonary/Chest: Breath sounds normal. He has no wheezes. He has no rhonchi. He has no rales.  Abdominal: Soft. Normal appearance and bowel sounds are normal. There is  no hepatosplenomegaly. There is no tenderness. There is no rebound, no guarding and no CVA tenderness. No hernia.  Lymphadenopathy:    He has no cervical adenopathy.  Neurological: He is alert. He has normal strength and normal reflexes. No cranial nerve deficit or sensory deficit. Gait normal.  Skin: Skin is  warm, dry and intact. No rash noted.  Psychiatric: He has a normal mood and affect. His speech is normal and behavior is normal. Judgment normal.          Assessment & Plan:

## 2017-08-24 NOTE — Assessment & Plan Note (Signed)
Inadequate control. Poor diet. On metformin.

## 2017-08-24 NOTE — Assessment & Plan Note (Signed)
Follow over time. 

## 2017-08-24 NOTE — Assessment & Plan Note (Signed)
Tolerable.. Not on medication to treat. Needs better DM control.  Had SE to gabapentin in past.

## 2017-08-28 ENCOUNTER — Encounter: Payer: Self-pay | Admitting: Family Medicine

## 2017-09-17 ENCOUNTER — Ambulatory Visit: Payer: Self-pay | Admitting: Family Medicine

## 2017-09-18 ENCOUNTER — Other Ambulatory Visit: Payer: Self-pay

## 2017-09-18 ENCOUNTER — Encounter: Payer: Self-pay | Admitting: *Deleted

## 2017-09-18 DIAGNOSIS — Y999 Unspecified external cause status: Secondary | ICD-10-CM | POA: Insufficient documentation

## 2017-09-18 DIAGNOSIS — Z79899 Other long term (current) drug therapy: Secondary | ICD-10-CM | POA: Insufficient documentation

## 2017-09-18 DIAGNOSIS — Y929 Unspecified place or not applicable: Secondary | ICD-10-CM | POA: Insufficient documentation

## 2017-09-18 DIAGNOSIS — S96912A Strain of unspecified muscle and tendon at ankle and foot level, left foot, initial encounter: Secondary | ICD-10-CM | POA: Insufficient documentation

## 2017-09-18 DIAGNOSIS — Z87891 Personal history of nicotine dependence: Secondary | ICD-10-CM | POA: Insufficient documentation

## 2017-09-18 DIAGNOSIS — Z7902 Long term (current) use of antithrombotics/antiplatelets: Secondary | ICD-10-CM | POA: Insufficient documentation

## 2017-09-18 DIAGNOSIS — I10 Essential (primary) hypertension: Secondary | ICD-10-CM | POA: Insufficient documentation

## 2017-09-18 DIAGNOSIS — Y939 Activity, unspecified: Secondary | ICD-10-CM | POA: Insufficient documentation

## 2017-09-18 DIAGNOSIS — Z7982 Long term (current) use of aspirin: Secondary | ICD-10-CM | POA: Insufficient documentation

## 2017-09-18 DIAGNOSIS — X58XXXA Exposure to other specified factors, initial encounter: Secondary | ICD-10-CM | POA: Insufficient documentation

## 2017-09-18 DIAGNOSIS — Z7984 Long term (current) use of oral hypoglycemic drugs: Secondary | ICD-10-CM | POA: Insufficient documentation

## 2017-09-18 DIAGNOSIS — E119 Type 2 diabetes mellitus without complications: Secondary | ICD-10-CM | POA: Insufficient documentation

## 2017-09-18 NOTE — ED Triage Notes (Signed)
Pt to triage via wheelchair.  Pt reports pain in left lower leg above the ankle.  No known injury.  Hx gout.  Pt reports throbbing and aching pain in lower leg.  Pt alert.

## 2017-09-19 ENCOUNTER — Encounter: Payer: Self-pay | Admitting: Emergency Medicine

## 2017-09-19 ENCOUNTER — Emergency Department: Payer: Self-pay

## 2017-09-19 ENCOUNTER — Emergency Department
Admission: EM | Admit: 2017-09-19 | Discharge: 2017-09-19 | Disposition: A | Discharge status: Home / Self Care | Payer: Self-pay | Attending: Emergency Medicine | Admitting: Emergency Medicine

## 2017-09-19 DIAGNOSIS — M79672 Pain in left foot: Secondary | ICD-10-CM

## 2017-09-19 DIAGNOSIS — S96912A Strain of unspecified muscle and tendon at ankle and foot level, left foot, initial encounter: Secondary | ICD-10-CM

## 2017-09-19 HISTORY — DX: Gout, unspecified: M10.9

## 2017-09-19 NOTE — ED Notes (Addendum)
Pt still in US at this time.

## 2017-09-19 NOTE — ED Provider Notes (Signed)
[facility] Emergency Department Provider Note  ____________________________________________   First MD Initiated Contact with Patient 09/19/17 0050     (approximate)  I have reviewed the triage vital signs and the nursing notes.   HISTORY  Chief Complaint Leg Pain    HPI Jamie Finley is a 57 y.o. male with adequate history as listed below who presents for evaluation of about 4-5 days of gradually worsening pain in his left foot.  It is the upper part of his left foot just below the ankle.  He has no known trauma/injury, but it is painful when he bears weight or when he pushes down on anything.  It feels like a stretching from the top of his foot back around to the back and up the lower part of his leg.  There is no numbness or tingling and no weakness.  He was worried he may have a DVT although he has not had one in the past.  There is no swelling or discoloration.  He denies fever/chills, chest pain, shortness of breath, and abdominal pain.  He has been having trouble with gout in his right elbow recently.   Past Medical History:  Diagnosis Date  . Arthritis   . Depression   . Diabetes mellitus without complication ([place])   . Frequent headaches   . Gout   . Hyperlipidemia   . Hypertension   . Stroke [facility])     Patient Active Problem List   Diagnosis Date Noted  . Bilateral knee pain 08/24/2017  . Seasonal depression ([place]) 08/24/2017  . Chronic low back pain 02/27/2017  . Acute pain of left hip 11/22/2016  . History of CVA (cerebrovascular accident) 01/15/2016  . Hyperlipidemia associated with type 2 diabetes mellitus ([place]) 02/18/2015  . Peripheral autonomic neuropathy due to diabetes mellitus ([place]) 02/18/2015  . Diabetes mellitus type 2, uncontrolled, with complications ([place]) 75/06/2584  . Diabetes ([place]) 12/15/2014  . Hypertension 12/15/2014  . Morbid obesity ([place]) 12/15/2014    Past Surgical History:  Procedure Laterality Date  . BACK  SURGERY  2007   [facility]  . TONSILLECTOMY      Prior to Admission medications   Medication Sig Start Date End Date Taking? Authorizing Provider  aspirin 81 MG tablet Take 81 mg by mouth daily.    Roselee Nova, MD  Cholecalciferol (VITAMIN D PO) Take 2,000 Units by mouth daily.    [provider]  clopidogrel (PLAVIX) 75 MG tablet Take 1 tablet (75 mg total) by mouth daily. 02/27/17   Roselee Nova, MD  hydrochlorothiazide (MICROZIDE) 12.5 MG capsule TAKE ONE TABLET BY MOUTH EVERY DAY 06/13/17   Roselee Nova, MD  lisinopril (PRINIVIL,ZESTRIL) 10 MG tablet Take 1 tablet (10 mg total) by mouth daily. 06/13/17   Roselee Nova, MD  metFORMIN (GLUCOPHAGE) 1000 MG tablet Take 1 tablet (1,000 mg total) by mouth 2 (two) times daily with a meal. 06/13/17   Roselee Nova, MD  rosuvastatin (CRESTOR) 20 MG tablet Take 1 tablet (20 mg total) by mouth at bedtime. 06/15/17   Roselee Nova, MD    Allergies Patient has no known allergies.  Family History  Problem Relation Age of Onset  . Cancer Mother        face and jaw  . Cancer Father        kidney  . Early death Father   . Diabetes Maternal Grandmother   . Depression Maternal Grandfather   .  Diabetes Maternal Grandfather   . Hearing loss Maternal Grandfather     Social History Social History   Tobacco Use  . Smoking status: Former Smoker    Last attempt to quit: 09/11/1989    Years since quitting: 28.0  . Smokeless tobacco: Never Used  Substance Use Topics  . Alcohol use: Yes  . Drug use: No    Review of Systems Constitutional: No fever/chills Eyes: No visual changes or complaints ENT: No sore throat. Cardiovascular: Denies chest pain. Respiratory: Denies shortness of breath. Gastrointestinal: No abdominal pain.  No nausea, no vomiting.  No diarrhea.  No constipation. Genitourinary: Negative for dysuria. Musculoskeletal: Pain in the left foot as described above.  Recent pain and swelling in his right  elbow consistent with gout. Integumentary: Negative for rash. Neurological: Negative for headaches, focal weakness or numbness.   ____________________________________________   PHYSICAL EXAM:  VITAL SIGNS: ED Triage Vitals  Enc Vitals Group     BP 09/18/17 2252 122/60     Pulse Rate 09/18/17 2252 76     Resp 09/18/17 2252 20     Temp 09/18/17 2252 98.8 F (37.1 C)     Temp Source 09/18/17 2252 Oral     SpO2 09/18/17 2252 97 %     Weight 09/18/17 2254 (!) 145.2 kg (320 lb)     Height 09/18/17 2254 1.829 m (6')     Head Circumference --      Peak Flow --      Pain Score 09/18/17 2252 5     Pain Loc --      Pain Edu? --      Excl. in [place]? --     Constitutional: Alert and oriented. Well appearing and in no acute distress. Eyes: Conjunctivae are normal.  Head: Atraumatic. Nose: No congestion/rhinnorhea. Mouth/Throat: Mucous membranes are moist. Neck: No stridor.  No meningeal signs.   Cardiovascular: Normal rate, regular rhythm. Good peripheral circulation. Grossly normal heart sounds. Respiratory: Normal respiratory effort.  No retractions. Lungs CTAB. Gastrointestinal: Soft and nontender. No distention.  Musculoskeletal: Tenderness to palpation of the proximal anterior part of his foot that seems to be along ligamentous lines.  There is no gross deformity, no discoloration, no swelling/edema.  Pain is reproduced with dorsiflexion and palpation.  No tenderness to palpation of the insertion point of the Achilles tendon or along the itself. Neurologic:  Normal speech and language. No gross focal neurologic deficits are appreciated.  Skin:  Skin is warm, dry and intact. No rash noted.  Chronic discoloration and skin changes/thickening of bilateral lower extremities but without any evidence of cellulitis or acute abnormal Psychiatric: Mood and affect are normal. Speech and behavior are normal.  ____________________________________________   LABS (all labs ordered are listed, but  only abnormal results are displayed)  Labs Reviewed - No data to display ____________________________________________  EKG  None - EKG not ordered by ED physician ____________________________________________  RADIOLOGY   US Venous Img Lower Unilateral Left  Result Date: 09/19/2017 CLINICAL DATA:  Left lower leg pain for 4 days. EXAM: LEFT LOWER EXTREMITY VENOUS DOPPLER ULTRASOUND TECHNIQUE: Gray-scale sonography with graded compression, as well as color Doppler and duplex ultrasound were performed to evaluate the lower extremity deep venous systems from the level of the common femoral vein and including the common femoral, femoral, profunda femoral, popliteal and calf veins including the posterior tibial, peroneal and gastrocnemius veins when visible. The superficial great saphenous vein was also interrogated. Spectral Doppler was utilized to evaluate flow  at rest and with distal augmentation maneuvers in the common femoral, femoral and popliteal veins. COMPARISON:  None. FINDINGS: Contralateral Common Femoral Vein: Respiratory phasicity is normal and symmetric with the symptomatic side. No evidence of thrombus. Normal compressibility. Common Femoral Vein: No evidence of thrombus. Normal compressibility, respiratory phasicity and response to augmentation. Saphenofemoral Junction: No evidence of thrombus. Normal compressibility and flow on color Doppler imaging. Profunda Femoral Vein: No evidence of thrombus. Normal compressibility and flow on color Doppler imaging. Femoral Vein: No evidence of thrombus. Normal compressibility, respiratory phasicity and response to augmentation. Popliteal Vein: No evidence of thrombus. Normal compressibility, respiratory phasicity and response to augmentation. Calf Veins: No evidence of thrombus. Normal compressibility and flow on color Doppler imaging. Superficial Great Saphenous Vein: No evidence of thrombus. Normal compressibility. Venous Reflux:  None. Other  Findings:  None. IMPRESSION: No evidence of deep venous thrombosis in the left lower extremity. Electronically Signed   By: Jeb Levering M.D.   On: 09/19/2017 01:17    ____________________________________________   PROCEDURES  Critical Care performed: No   Procedure(s) performed:   Procedures   ____________________________________________   INITIAL IMPRESSION / ASSESSMENT AND PLAN / ED COURSE  As part of my medical decision making, I reviewed the following data within the [place] notes reviewed and incorporated   Differential diagnosis includes, but is not limited to, DVT, tendinitis, ligamentous strain/sprain, fracture/dislocation.  The patient's ultrasound was negative for DVT.  He has no gross abnormalities on physical exam other than the tenderness to palpation and the exam makes me suspect a ligamentous strain versus tendinitis.  I considered radiographs but given that the patient has no traumatic injury and no swelling, ecchymosis, or gross deformity, I do not think there would be useful at this time.  It seems much more consistent with a soft tissue injury, at this time.  He has limitations on what he can take in terms of NSAIDs.  I recommended RICE and podiatry follow up.  Offered crutches but patient declined because he is getting by with his cane.  Has PCP follow up within a few days and will consider podiatry.  I gave my usual and customary return precautions.      ____________________________________________  FINAL CLINICAL IMPRESSION(S) / ED DIAGNOSES  Final diagnoses:  Muscle strain of left foot, initial encounter  Left foot pain     MEDICATIONS GIVEN DURING THIS VISIT:  Medications - No data to display   ED Discharge Orders    None       Note:  This document was prepared using Dragon voice recognition software and may include unintentional dictation errors.    Hinda Kehr, MD 09/19/17 [phone]

## 2017-09-19 NOTE — Discharge Instructions (Signed)
We believe that your symptoms are caused by musculoskeletal strain.  Please read through the included information about additional care such as cryotherapy, over-the-counter pain medicine.  Remember that early mobility and using the affected part of your body is actually better than keeping it immobile; bear weight on your foot as tolerated.  Follow-up with the doctor listed as recommended or return to the emergency department with new or worsening symptoms that concern you.

## 2017-09-20 ENCOUNTER — Telehealth: Payer: Self-pay | Admitting: Family Medicine

## 2017-09-20 DIAGNOSIS — IMO0002 Reserved for concepts with insufficient information to code with codable children: Secondary | ICD-10-CM

## 2017-09-20 DIAGNOSIS — E118 Type 2 diabetes mellitus with unspecified complications: Principal | ICD-10-CM

## 2017-09-20 DIAGNOSIS — E1165 Type 2 diabetes mellitus with hyperglycemia: Secondary | ICD-10-CM

## 2017-09-20 NOTE — Telephone Encounter (Signed)
-----   Message from Ellamae Sia sent at 09/12/2017 11:36 AM EST ----- Regarding: Lab orders for Friday, 1.11.19 Labs for DM f/u

## 2017-09-21 ENCOUNTER — Telehealth: Payer: Self-pay | Admitting: Family Medicine

## 2017-09-21 ENCOUNTER — Other Ambulatory Visit (INDEPENDENT_AMBULATORY_CARE_PROVIDER_SITE_OTHER): Payer: Self-pay

## 2017-09-21 DIAGNOSIS — IMO0002 Reserved for concepts with insufficient information to code with codable children: Secondary | ICD-10-CM

## 2017-09-21 DIAGNOSIS — E118 Type 2 diabetes mellitus with unspecified complications: Secondary | ICD-10-CM

## 2017-09-21 DIAGNOSIS — E1165 Type 2 diabetes mellitus with hyperglycemia: Secondary | ICD-10-CM

## 2017-09-21 DIAGNOSIS — E785 Hyperlipidemia, unspecified: Secondary | ICD-10-CM

## 2017-09-21 DIAGNOSIS — I10 Essential (primary) hypertension: Secondary | ICD-10-CM

## 2017-09-21 LAB — COMPREHENSIVE METABOLIC PANEL WITH GFR
ALT: 15 U/L (ref 0–53)
AST: 16 U/L (ref 0–37)
Albumin: 4 g/dL (ref 3.5–5.2)
Alkaline Phosphatase: 72 U/L (ref 39–117)
BUN: 15 mg/dL (ref 6–23)
CO2: 31 meq/L (ref 19–32)
Calcium: 9.5 mg/dL (ref 8.4–10.5)
Chloride: 99 meq/L (ref 96–112)
Creatinine, Ser: 0.96 mg/dL (ref 0.40–1.50)
GFR: 85.85 mL/min
Glucose, Bld: 182 mg/dL — ABNORMAL HIGH (ref 70–99)
Potassium: 4 meq/L (ref 3.5–5.1)
Sodium: 138 meq/L (ref 135–145)
Total Bilirubin: 0.8 mg/dL (ref 0.2–1.2)
Total Protein: 7.4 g/dL (ref 6.0–8.3)

## 2017-09-21 LAB — LIPID PANEL
CHOL/HDL RATIO: 3
Cholesterol: 136 mg/dL (ref 0–200)
HDL: 42.7 mg/dL (ref >39.00)
LDL CALC: 61 mg/dL (ref 0–99)
NonHDL: 93.38
Triglycerides: 163 mg/dL — ABNORMAL HIGH (ref 0.0–149.0)
VLDL: 32.6 mg/dL (ref 0.0–40.0)

## 2017-09-21 LAB — HEMOGLOBIN A1C: Hgb A1c MFr Bld: 9.3 % — ABNORMAL HIGH (ref 4.6–6.5)

## 2017-09-21 MED ORDER — HYDROCHLOROTHIAZIDE 12.5 MG PO CAPS: ORAL_CAPSULE | ORAL | 1 refills | Status: DC

## 2017-09-21 MED ORDER — LISINOPRIL 10 MG PO TABS: 10.0000 mg | ORAL_TABLET | Freq: Every day | ORAL | 1 refills | Status: DC

## 2017-09-21 MED ORDER — ROSUVASTATIN CALCIUM 20 MG PO TABS: 20.0000 mg | ORAL_TABLET | Freq: Every day | ORAL | 1 refills | Status: DC

## 2017-09-21 MED ORDER — METFORMIN HCL 1000 MG PO TABS: 1000.0000 mg | ORAL_TABLET | Freq: Two times a day (BID) | ORAL | 1 refills | Status: DC

## 2017-09-21 NOTE — Telephone Encounter (Signed)
Refills sent

## 2017-09-21 NOTE — Telephone Encounter (Signed)
Pt uses medication management clinic next to the [place] [facility]. Pt is out of all meds. Please send in scripts today. Pt ph [phone].  Pt has appt tue 09/25/17 for lab results and meds adjustment fu.

## 2017-09-24 ENCOUNTER — Telehealth: Payer: Self-pay | Admitting: Family Medicine

## 2017-09-24 NOTE — Telephone Encounter (Unsigned)
Copied from [place] [id number]. Topic: Medical Record Request - Other >> Sep 24, 2017 12:12 PM Neva Seat wrote: Roderic Scarce w/ [employer]- [phone] ext [phone]   He sent a medical request on Jan 2nd.  Wanting know if the office received it and what the status is?  Please call him back to let him know.

## 2017-09-25 ENCOUNTER — Ambulatory Visit (INDEPENDENT_AMBULATORY_CARE_PROVIDER_SITE_OTHER): Payer: Self-pay | Admitting: Family Medicine

## 2017-09-25 ENCOUNTER — Encounter: Payer: Self-pay | Admitting: Family Medicine

## 2017-09-25 ENCOUNTER — Other Ambulatory Visit: Payer: Self-pay

## 2017-09-25 DIAGNOSIS — IMO0002 Reserved for concepts with insufficient information to code with codable children: Secondary | ICD-10-CM

## 2017-09-25 DIAGNOSIS — M109 Gout, unspecified: Secondary | ICD-10-CM

## 2017-09-25 DIAGNOSIS — E118 Type 2 diabetes mellitus with unspecified complications: Secondary | ICD-10-CM

## 2017-09-25 DIAGNOSIS — I10 Essential (primary) hypertension: Secondary | ICD-10-CM

## 2017-09-25 DIAGNOSIS — M10071 Idiopathic gout, right ankle and foot: Secondary | ICD-10-CM | POA: Insufficient documentation

## 2017-09-25 DIAGNOSIS — E1165 Type 2 diabetes mellitus with hyperglycemia: Secondary | ICD-10-CM

## 2017-09-25 DIAGNOSIS — E785 Hyperlipidemia, unspecified: Secondary | ICD-10-CM

## 2017-09-25 DIAGNOSIS — E1169 Type 2 diabetes mellitus with other specified complication: Secondary | ICD-10-CM

## 2017-09-25 LAB — HM DIABETES FOOT EXAM

## 2017-09-25 MED ORDER — COLCHICINE 0.6 MG PO TABS: ORAL_TABLET | ORAL | 1 refills | Status: DC

## 2017-09-25 NOTE — Patient Instructions (Addendum)
Drink water instead of soda, tea etc. Work on low carb diet!  Try to walk as much as able.  Start course of colchicine for gout flare.  Hold crestor while of colchicine then restart when off.  Low-Purine Diet Purines are compounds that affect the level of uric acid in your body. A low-purine diet is a diet that is low in purines. Eating a low-purine diet can prevent the level of uric acid in your body from getting too high and causing gout or kidney stones or both. What do I need to know about this diet?  Choose low-purine foods. Examples of low-purine foods are listed in the next section.  Drink plenty of fluids, especially water. Fluids can help remove uric acid from your body. Try to drink 8-16 cups (1.9-3.8 L) a day.  Limit foods high in fat, especially saturated fat, as fat makes it harder for the body to get rid of uric acid. Foods high in saturated fat include pizza, cheese, ice cream, whole milk, fried foods, and gravies. Choose foods that are lower in fat and lean sources of protein. Use olive oil when cooking as it contains healthy fats that are not high in saturated fat.  Limit alcohol. Alcohol interferes with the elimination of uric acid from your body. If you are having a gout attack, avoid all alcohol.  Keep in mind that different people's bodies react differently to different foods. You will probably learn over time which foods do or do not affect you. If you discover that a food tends to cause your gout to flare up, avoid eating that food. You can more freely enjoy foods that do not cause problems. If you have any questions about a food item, talk to your dietitian or health care provider. Which foods are low, moderate, and high in purines? The following is a list of foods that are low, moderate, and high in purines. You can eat any amount of the foods that are low in purines. You may be able to have small amounts of foods that are moderate in purines. Ask your health care  provider how much of a food moderate in purines you can have. Avoid foods high in purines. Grains  Foods low in purines: Enriched white bread, pasta, rice, cake, cornbread, popcorn.  Foods moderate in purines: Whole-grain breads and cereals, wheat germ, bran, oatmeal. Uncooked oatmeal. Dry wheat bran or wheat germ.  Foods high in purines: Pancakes, [place] toast, biscuits, muffins. Vegetables  Foods low in purines: All vegetables, except those that are moderate in purines.  Foods moderate in purines: Asparagus, cauliflower, spinach, mushrooms, green peas. Fruits  All fruits are low in purines. Meats and other Protein Foods  Foods low in purines: Eggs, nuts, peanut butter.  Foods moderate in purines: 80-90% lean beef, lamb, veal, pork, poultry, fish, eggs, peanut butter, nuts. Crab, lobster, oysters, and shrimp. Cooked dried beans, peas, and lentils.  Foods high in purines: Anchovies, sardines, herring, mussels, tuna, codfish, scallops, trout, and haddock. [place] Salines. Organ meats (such as liver or kidney). [place]. Game meat. Goose. Sweetbreads. Dairy  All dairy foods are low in purines. Low-fat and fat-free dairy products are best because they are low in saturated fat. Beverages  Drinks low in purines: Water, carbonated beverages, tea, coffee, cocoa.  Drinks moderate in purines: Soft drinks and other drinks sweetened with high-fructose corn syrup. Juices. To find whether a food or drink is sweetened with high-fructose corn syrup, look at the ingredients list.  Drinks high in  purines: Alcoholic beverages (such as beer). Condiments  Foods low in purines: Salt, herbs, olives, pickles, relishes, vinegar.  Foods moderate in purines: Butter, margarine, oils, mayonnaise. Fats and Oils  Foods low in purines: All types, except gravies and sauces made with meat.  Foods high in purines: Gravies and sauces made with meat. Other Foods  Foods low in purines: Sugars, sweets, gelatin. Cake.  Soups made without meat.  Foods moderate in purines: Meat-based or fish-based soups, broths, or bouillons. Foods and drinks sweetened with high-fructose corn syrup.  Foods high in purines: High-fat desserts (such as ice cream, cookies, cakes, pies, doughnuts, and chocolate). Contact your dietitian for more information on foods that are not listed here. This information is not intended to replace advice given to you by your health care provider. Make sure you discuss any questions you have with your health care provider. Document Released: 12/23/2010 Document Revised: 02/03/2016 Document Reviewed: 08/04/2013 Elsevier Interactive Patient Education  2017 [employer].

## 2017-09-25 NOTE — Assessment & Plan Note (Signed)
Well controlled. Continue current medication. Encouraged exercise, weight loss, healthy eating habits.  

## 2017-09-25 NOTE — Progress Notes (Signed)
   Subjective:    Patient ID: Jamie Finley, male    DOB: 01-Dec-1960, 57 y.o.   MRN: [id number]  HPI    57 year old male 1 month follow up DM as well as ER follow up.  Diabetes:   Inadequate control on 1000 mg metformin max. Worse control then at last 3 month check. Lab Results  Component Value Date   HGBA1C 9.3 (H) 09/21/2017  Using medications without difficulties: Hypoglycemic episodes: Hyperglycemic episodes: Feet problems: Blood Sugars averaging: eye exam within last year:  Elevated Cholesterol:  LDL at goal on  crestor 20 mg daily Lab Results  Component Value Date   CHOL 136 09/21/2017   HDL 42.70 09/21/2017   LDLCALC 61 09/21/2017   TRIG 163.0 (H) 09/21/2017   CHOLHDL 3 09/21/2017  Using medications without problems: Muscle aches:  Diet compliance: poor .Marland Kitchen Lots of soda and candy. Exercise: minimal Other complaints:   Wt Readings from Last 3 Encounters:  09/25/17 (!) 313 lb 8 oz (142.2 kg)  09/18/17 (!) 320 lb (145.2 kg)  08/24/17 (!) 321 lb 6.4 oz (145.8 kg)   Gout:  He has had recent flare of gout in right elbow and in ankles.  he has been working on low uric acid diet.  Gout current  In right elbow.. Red, heat and swelling.  Trying to not use NSAIDs given plavix. He has uses ibuprofen.Marland Kitchen Helps a little.  SE to allopurinol in past.. nause.  ER visit for muscle strain of left foot on 09/19/2016  [place]: neg for DVT  Blood pressure 100/70, pulse 71, temperature 98.6 F (37 C), temperature source Oral, height 6' (1.829 m), weight (!) 313 lb 8 oz (142.2 kg). Social History /Family History/Past Medical History reviewed in detail and updated in EMR if needed.  Review of Systems  Constitutional: Negative for fatigue and fever.  HENT: Negative for ear pain.   Eyes: Negative for pain.  Respiratory: Negative for cough and shortness of breath.   Cardiovascular: Negative for chest pain, palpitations and leg swelling.  Gastrointestinal: Negative for abdominal pain.    Genitourinary: Negative for dysuria.  Musculoskeletal: Positive for myalgias. Negative for arthralgias.  Neurological: Negative for syncope, light-headedness and headaches.  Psychiatric/Behavioral: Negative for dysphoric mood.       Objective:   Physical Exam  Constitutional: Vital signs are normal. He appears well-developed and well-nourished.  Morbid obesity  HENT:  Head: Normocephalic.  Right Ear: Hearing normal.  Left Ear: Hearing normal.  Nose: Nose normal.  Mouth/Throat: Oropharynx is clear and moist and mucous membranes are normal.  Neck: Trachea normal. Carotid bruit is not present. No thyroid mass and no thyromegaly present.  Cardiovascular: Normal rate, regular rhythm and normal pulses. Exam reveals no gallop, no distant heart sounds and no friction rub.  No murmur heard. No peripheral edema  Pulmonary/Chest: Effort normal and breath sounds normal. No respiratory distress.  Musculoskeletal:       Right elbow: He exhibits decreased range of motion and swelling.  Redness and heat suggestive of gout in right elbow  Skin: Skin is warm, dry and intact. No rash noted.  Psychiatric: He has a normal mood and affect. His speech is normal and behavior is normal. Thought content normal.    Diabetic foot exam: Normal inspection No skin breakdown  Heel calluses  Normal DP pulses decreased sensation to light touch and monofilament Nails normal       Assessment & Plan:

## 2017-09-25 NOTE — Assessment & Plan Note (Signed)
Well controlled. Continue current medication.  

## 2017-09-25 NOTE — Assessment & Plan Note (Signed)
Poor control.. Will work on diet. If not at goal next OV.. Can increase metformin x 1/2 tabs daily to max. Or can consider addition of glipizide.

## 2017-09-25 NOTE — Assessment & Plan Note (Signed)
Treat with colchicine course as NSAIDs contraindicated. If not improving can try pred taper but will avoid given DM.   SE to allopruinol in past. Check uric acid next lab OV, work on low uric acid diet.

## 2017-09-27 ENCOUNTER — Telehealth: Payer: Self-pay | Admitting: Pharmacist

## 2017-09-27 NOTE — Telephone Encounter (Signed)
09/27/17 Received a pharmacy printout for Colcrys 0.6mg  Take two tablet by mouth once, repeat in one hours. On day 2 start 1 tablet two times a day until flare resolved. I have printed [employer] application, mailing provider Dr. [name] @ [place] [facility] [address] [place], [place] [address] her portion to sign & return by mail. Will mail patient his portion to sign & return with Dec 2018 income (income in [place] is July).Delos Haring

## 2017-10-09 ENCOUNTER — Telehealth: Payer: Self-pay | Admitting: Pharmacist

## 2017-10-09 NOTE — Telephone Encounter (Signed)
10/09/17 I have received provider portion for Colcrys back, holding for patient to return his portion with income requested.Delos Haring

## 2017-11-13 ENCOUNTER — Telehealth: Payer: Self-pay | Admitting: Family Medicine

## 2017-11-13 NOTE — Telephone Encounter (Signed)
Office note and snapshot faxed to requested party

## 2017-11-13 NOTE — Telephone Encounter (Signed)
Copied from [place]. Topic: Quick Communication - See Telephone Encounter >> Nov 13, 2017  8:29 AM Hewitt Shorts wrote: CRM for notification. See Telephone encounter for:  [facility] is calling stating that the patient is at the office now and no medical record please fax records to [phone] and phone is [phone] they especially meed the [place] with diagnosis, meds list, surgical history ,allergies 11/13/17.  The original notes for this request in January was not correct information confirmed from [facility] this morning

## 2017-12-17 ENCOUNTER — Telehealth: Payer: Self-pay | Admitting: Family Medicine

## 2017-12-17 ENCOUNTER — Other Ambulatory Visit (INDEPENDENT_AMBULATORY_CARE_PROVIDER_SITE_OTHER): Payer: Self-pay

## 2017-12-17 DIAGNOSIS — E1165 Type 2 diabetes mellitus with hyperglycemia: Secondary | ICD-10-CM

## 2017-12-17 DIAGNOSIS — E118 Type 2 diabetes mellitus with unspecified complications: Secondary | ICD-10-CM

## 2017-12-17 DIAGNOSIS — IMO0002 Reserved for concepts with insufficient information to code with codable children: Secondary | ICD-10-CM

## 2017-12-17 LAB — COMPREHENSIVE METABOLIC PANEL
ALT: 15 U/L (ref 0–53)
AST: 16 U/L (ref 0–37)
Albumin: 4.1 g/dL (ref 3.5–5.2)
Alkaline Phosphatase: 64 U/L (ref 39–117)
BUN: 12 mg/dL (ref 6–23)
CHLORIDE: 100 meq/L (ref 96–112)
CO2: 30 meq/L (ref 19–32)
CREATININE: 0.98 mg/dL (ref 0.40–1.50)
Calcium: 9 mg/dL (ref 8.4–10.5)
GFR: 83.76 mL/min (ref >60.00)
Glucose, Bld: 223 mg/dL — ABNORMAL HIGH (ref 70–99)
Potassium: 4 mEq/L (ref 3.5–5.1)
SODIUM: 137 meq/L (ref 135–145)
Total Bilirubin: 0.6 mg/dL (ref 0.2–1.2)
Total Protein: 7.3 g/dL (ref 6.0–8.3)

## 2017-12-17 LAB — HEMOGLOBIN A1C: Hgb A1c MFr Bld: 9.2 % — ABNORMAL HIGH (ref 4.6–6.5)

## 2017-12-17 NOTE — Telephone Encounter (Signed)
-----   Message from Ellamae Sia sent at 12/11/2017 10:39 AM EDT ----- Regarding: Lab orders for Monday, 4.8.19 Lab orders for a 3 month follow up appt.

## 2017-12-21 ENCOUNTER — Other Ambulatory Visit: Payer: Self-pay

## 2017-12-21 ENCOUNTER — Ambulatory Visit (INDEPENDENT_AMBULATORY_CARE_PROVIDER_SITE_OTHER): Payer: Self-pay | Admitting: Family Medicine

## 2017-12-21 ENCOUNTER — Encounter: Payer: Self-pay | Admitting: Family Medicine

## 2017-12-21 DIAGNOSIS — IMO0002 Reserved for concepts with insufficient information to code with codable children: Secondary | ICD-10-CM

## 2017-12-21 DIAGNOSIS — E1165 Type 2 diabetes mellitus with hyperglycemia: Secondary | ICD-10-CM

## 2017-12-21 DIAGNOSIS — E118 Type 2 diabetes mellitus with unspecified complications: Secondary | ICD-10-CM

## 2017-12-21 MED ORDER — GLIPIZIDE ER 10 MG PO TB24: 10.0000 mg | ORAL_TABLET | Freq: Every day | ORAL | 11 refills | Status: DC

## 2017-12-21 NOTE — Progress Notes (Signed)
   Subjective:    Patient ID: Jamie Finley, male    DOB: [birth year], 57 y.o.   MRN: [id number]  HPI    57 year old male presents for 3 month follow up.   Diabetes:   Poor control  No improvement in last 3 months. On metformin with room to increase. Lab Results  Component Value Date   HGBA1C 9.2 (H) 12/17/2017  Using medications without difficulties: Hypoglycemic episodes: Hyperglycemic episodes: Feet problems: Blood Sugars averaging: fbs 223 eye exam within last year:  He has been trying to   He has been accepted in clinical trial for testosterone.Marland Kitchen He is not sure if he is on placebo or not.   he has been feeling more energetic, sleeping better.  he been trying to [place] better in last 1-2 week,  Increased exercise but was limited in ;last week given low back pain/ hip pain. Using occ ibuprofen..   Icreaseing water.  Limiting soda. Wt Readings from Last 3 Encounters:  12/21/17 (!) 317 lb (143.8 kg)  09/25/17 (!) 313 lb 8 oz (142.2 kg)  09/18/17 (!) 320 lb (145.2 kg)     Review of Systems  Constitutional: Negative for fatigue and fever.  HENT: Negative for ear pain.   Eyes: Negative for pain.  Respiratory: Negative for cough and shortness of breath.   Cardiovascular: Positive for chest pain. Negative for palpitations and leg swelling.       Occ sharp pain in left chest after drinking soda, non exertional.  Gastrointestinal: Negative for abdominal pain.  Genitourinary: Negative for dysuria.  Musculoskeletal: Negative for arthralgias.  Neurological: Negative for syncope, light-headedness and headaches.  Psychiatric/Behavioral: Negative for dysphoric mood.       Objective:   Physical Exam  Constitutional: Vital signs are normal. He appears well-developed and well-nourished.  Morbid obesity  HENT:  Head: Normocephalic.  Right Ear: Hearing normal.  Left Ear: Hearing normal.  Nose: Nose normal.  Mouth/Throat: Oropharynx is clear and moist and mucous membranes are  normal.  Neck: Trachea normal. Carotid bruit is not present. No thyroid mass and no thyromegaly present.  Cardiovascular: Normal rate, regular rhythm and normal pulses. Exam reveals no gallop, no distant heart sounds and no friction rub.  No murmur heard. No peripheral edema  Pulmonary/Chest: Effort normal and breath sounds normal. No respiratory distress.  Skin: Skin is warm, dry and intact. No rash noted.  Psychiatric: He has a normal mood and affect. His speech is normal and behavior is normal. Thought content normal.     Diabetic foot exam: Normal inspection No skin breakdown  Large heel calluses  Normal DP pulses Normal sensation to light touch and monofilament Nails  Thickened and left great toenail with fungal infeciton.      Assessment & Plan:

## 2017-12-21 NOTE — Patient Instructions (Addendum)
Start glipizide daily along with metformrin.  Increase exercsie, weight loss and low carb diet.

## 2017-12-21 NOTE — Assessment & Plan Note (Signed)
Inadequate control Encouraged exercise, weight loss, healthy eating habits.  Increase metformin to max.  follow up in 3 months.

## 2017-12-25 ENCOUNTER — Telehealth: Payer: Self-pay | Admitting: Family Medicine

## 2017-12-25 MED ORDER — GLIPIZIDE 5 MG PO TABS: 5.0000 mg | ORAL_TABLET | Freq: Two times a day (BID) | ORAL | 3 refills | Status: DC

## 2017-12-25 NOTE — Telephone Encounter (Signed)
-----   Message from Tristan Schroeder, [facility] sent at 12/25/2017  1:47 PM EDT ----- Regarding: RX change Request Hello,  Mr. Duman has his medications filled at no charge at [facility]. We do not have the ER form of Glipizide in stock. We do have the immediate release form. Please consider changing to either 5mg  BID or 10mg  qam.  Thank you,  Netta Neat, PharmD, [facility]) [phone]

## 2018-01-04 ENCOUNTER — Other Ambulatory Visit: Payer: Self-pay | Admitting: Family Medicine

## 2018-01-04 DIAGNOSIS — I10 Essential (primary) hypertension: Secondary | ICD-10-CM

## 2018-01-07 NOTE — Telephone Encounter (Signed)
10/24/17 request process fax from epic. [phone]

## 2018-02-27 ENCOUNTER — Telehealth: Payer: Self-pay | Admitting: Pharmacy Technician

## 2018-02-27 NOTE — Telephone Encounter (Signed)
Patient provided paystubs that [employer] already has from February 2019.  PAP coordinator needs last 30 days of pay stubs and [employer] in order to process PAP application for Colcrys and for recertification.  Patient notified.  [facility]

## 2018-03-14 ENCOUNTER — Observation Stay
Admission: EM | Admit: 2018-03-14 | Discharge: 2018-03-16 | Disposition: A | Discharge status: Home / Self Care | Payer: Self-pay | Attending: Internal Medicine | Admitting: Internal Medicine

## 2018-03-14 ENCOUNTER — Emergency Department: Payer: Self-pay

## 2018-03-14 ENCOUNTER — Encounter: Payer: Self-pay | Admitting: Emergency Medicine

## 2018-03-14 ENCOUNTER — Other Ambulatory Visit: Payer: Self-pay

## 2018-03-14 DIAGNOSIS — M25519 Pain in unspecified shoulder: Secondary | ICD-10-CM

## 2018-03-14 DIAGNOSIS — I700 Atherosclerosis of aorta: Secondary | ICD-10-CM | POA: Insufficient documentation

## 2018-03-14 DIAGNOSIS — Z6841 Body Mass Index (BMI) 40.0 and over, adult: Secondary | ICD-10-CM | POA: Insufficient documentation

## 2018-03-14 DIAGNOSIS — Z7982 Long term (current) use of aspirin: Secondary | ICD-10-CM | POA: Insufficient documentation

## 2018-03-14 DIAGNOSIS — M7502 Adhesive capsulitis of left shoulder: Secondary | ICD-10-CM | POA: Insufficient documentation

## 2018-03-14 DIAGNOSIS — Z87891 Personal history of nicotine dependence: Secondary | ICD-10-CM | POA: Insufficient documentation

## 2018-03-14 DIAGNOSIS — I10 Essential (primary) hypertension: Secondary | ICD-10-CM | POA: Insufficient documentation

## 2018-03-14 DIAGNOSIS — E1143 Type 2 diabetes mellitus with diabetic autonomic (poly)neuropathy: Secondary | ICD-10-CM | POA: Insufficient documentation

## 2018-03-14 DIAGNOSIS — Z7984 Long term (current) use of oral hypoglycemic drugs: Secondary | ICD-10-CM | POA: Insufficient documentation

## 2018-03-14 DIAGNOSIS — Z79899 Other long term (current) drug therapy: Secondary | ICD-10-CM | POA: Insufficient documentation

## 2018-03-14 DIAGNOSIS — M542 Cervicalgia: Secondary | ICD-10-CM

## 2018-03-14 DIAGNOSIS — R079 Chest pain, unspecified: Principal | ICD-10-CM | POA: Insufficient documentation

## 2018-03-14 DIAGNOSIS — Z7902 Long term (current) use of antithrombotics/antiplatelets: Secondary | ICD-10-CM | POA: Insufficient documentation

## 2018-03-14 DIAGNOSIS — M1990 Unspecified osteoarthritis, unspecified site: Secondary | ICD-10-CM | POA: Insufficient documentation

## 2018-03-14 DIAGNOSIS — Z8673 Personal history of transient ischemic attack (TIA), and cerebral infarction without residual deficits: Secondary | ICD-10-CM | POA: Insufficient documentation

## 2018-03-14 DIAGNOSIS — E785 Hyperlipidemia, unspecified: Secondary | ICD-10-CM | POA: Insufficient documentation

## 2018-03-14 DIAGNOSIS — F329 Major depressive disorder, single episode, unspecified: Secondary | ICD-10-CM | POA: Insufficient documentation

## 2018-03-14 DIAGNOSIS — M50323 Other cervical disc degeneration at C6-C7 level: Secondary | ICD-10-CM | POA: Insufficient documentation

## 2018-03-14 LAB — BASIC METABOLIC PANEL
ANION GAP: 10 (ref 5–15)
BUN: 19 mg/dL (ref 6–20)
CO2: 24 mmol/L (ref 22–32)
Calcium: 9.1 mg/dL (ref 8.9–10.3)
Chloride: 103 mmol/L (ref 98–111)
Creatinine, Ser: 1.05 mg/dL (ref 0.61–1.24)
GFR calc Af Amer: >60 mL/min (ref >60)
GFR calc non Af Amer: >60 mL/min (ref >60)
GLUCOSE: 131 mg/dL — AB (ref 70–99)
POTASSIUM: 4 mmol/L (ref 3.5–5.1)
Sodium: 137 mmol/L (ref 135–145)

## 2018-03-14 LAB — TROPONIN I
Troponin I: <0.03 ng/mL (ref <0.03)
Troponin I: <0.03 ng/mL (ref <0.03)
Troponin I: <0.03 ng/mL (ref <0.03)

## 2018-03-14 LAB — CBC
HEMATOCRIT: 39.4 % — AB (ref 40.0–52.0)
HEMOGLOBIN: 13.7 g/dL (ref 13.0–18.0)
MCH: 29.1 pg (ref 26.0–34.0)
MCHC: 34.7 g/dL (ref 32.0–36.0)
MCV: 83.9 fL (ref 80.0–100.0)
Platelets: 270 10*3/uL (ref 150–440)
RBC: 4.69 MIL/uL (ref 4.40–5.90)
RDW: 14.5 % (ref 11.5–14.5)
WBC: 11.9 10*3/uL — ABNORMAL HIGH (ref 3.8–10.6)

## 2018-03-14 LAB — GLUCOSE, CAPILLARY
GLUCOSE-CAPILLARY: 137 mg/dL — AB (ref 70–99)
GLUCOSE-CAPILLARY: 138 mg/dL — AB (ref 70–99)

## 2018-03-14 MED ORDER — IOHEXOL 350 MG/ML SOLN
75.0000 mL | Freq: Once | INTRAVENOUS | Status: AC | PRN
  Administered 2018-03-14: 75 mL via INTRAVENOUS

## 2018-03-14 MED ORDER — INSULIN ASPART 100 UNIT/ML ~~LOC~~ SOLN
0.0000 [IU] | Freq: Three times a day (TID) | SUBCUTANEOUS | Status: DC
  Administered 2018-03-14: 1 [IU] via SUBCUTANEOUS
  Administered 2018-03-15: 2 [IU] via SUBCUTANEOUS
  Administered 2018-03-15: 5 [IU] via SUBCUTANEOUS
  Administered 2018-03-16: 2 [IU] via SUBCUTANEOUS
  Filled 2018-03-14 (×4): qty 1

## 2018-03-14 MED ORDER — HEPARIN SODIUM (PORCINE) 5000 UNIT/ML IJ SOLN
5000.0000 [IU] | Freq: Three times a day (TID) | INTRAMUSCULAR | Status: DC
  Administered 2018-03-15 – 2018-03-16 (×3): 5000 [IU] via SUBCUTANEOUS
  Filled 2018-03-14 (×4): qty 1

## 2018-03-14 MED ORDER — HYDROCHLOROTHIAZIDE 12.5 MG PO CAPS
12.5000 mg | ORAL_CAPSULE | Freq: Every day | ORAL | Status: DC
  Administered 2018-03-14 – 2018-03-16 (×3): 12.5 mg via ORAL
  Filled 2018-03-14 (×3): qty 1

## 2018-03-14 MED ORDER — DOCUSATE SODIUM 100 MG PO CAPS: 100.0000 mg | ORAL_CAPSULE | Freq: Two times a day (BID) | ORAL | Status: DC | PRN

## 2018-03-14 MED ORDER — METFORMIN HCL 500 MG PO TABS
1000.0000 mg | ORAL_TABLET | Freq: Two times a day (BID) | ORAL | Status: DC
  Administered 2018-03-15 – 2018-03-16 (×3): 1000 mg via ORAL
  Filled 2018-03-14 (×3): qty 2

## 2018-03-14 MED ORDER — CLOPIDOGREL BISULFATE 75 MG PO TABS
75.0000 mg | ORAL_TABLET | Freq: Every day | ORAL | Status: DC
  Administered 2018-03-14 – 2018-03-16 (×3): 75 mg via ORAL
  Filled 2018-03-14 (×3): qty 1

## 2018-03-14 MED ORDER — ACETAMINOPHEN 500 MG PO TABS
1000.0000 mg | ORAL_TABLET | Freq: Once | ORAL | Status: AC
  Administered 2018-03-14: 1000 mg via ORAL
  Filled 2018-03-14: qty 2

## 2018-03-14 MED ORDER — GLIPIZIDE 5 MG PO TABS
5.0000 mg | ORAL_TABLET | Freq: Two times a day (BID) | ORAL | Status: DC
  Administered 2018-03-15 – 2018-03-16 (×3): 5 mg via ORAL
  Filled 2018-03-14 (×4): qty 1

## 2018-03-14 MED ORDER — COLCHICINE 0.6 MG PO TABS
0.6000 mg | ORAL_TABLET | Freq: Every day | ORAL | Status: DC
  Administered 2018-03-14 – 2018-03-16 (×3): 0.6 mg via ORAL
  Filled 2018-03-14 (×3): qty 1

## 2018-03-14 MED ORDER — KETOROLAC TROMETHAMINE 30 MG/ML IJ SOLN
15.0000 mg | INTRAMUSCULAR | Status: AC
  Administered 2018-03-14: 15 mg via INTRAVENOUS
  Filled 2018-03-14: qty 1

## 2018-03-14 MED ORDER — MORPHINE SULFATE (PF) 4 MG/ML IV SOLN
4.0000 mg | Freq: Once | INTRAVENOUS | Status: AC
  Administered 2018-03-14: 4 mg via INTRAVENOUS
  Filled 2018-03-14: qty 1

## 2018-03-14 MED ORDER — LISINOPRIL 10 MG PO TABS
10.0000 mg | ORAL_TABLET | Freq: Every day | ORAL | Status: DC
  Administered 2018-03-14 – 2018-03-16 (×3): 10 mg via ORAL
  Filled 2018-03-14 (×3): qty 1

## 2018-03-14 MED ORDER — ASPIRIN EC 81 MG PO TBEC
81.0000 mg | DELAYED_RELEASE_TABLET | Freq: Every evening | ORAL | Status: DC
Start: 2018-03-14 — End: 2018-03-16
  Administered 2018-03-14 – 2018-03-15 (×3): 81 mg via ORAL
  Filled 2018-03-14 (×3): qty 1

## 2018-03-14 MED ORDER — ROSUVASTATIN CALCIUM 10 MG PO TABS
20.0000 mg | ORAL_TABLET | Freq: Every day | ORAL | Status: DC
  Administered 2018-03-14 – 2018-03-15 (×2): 20 mg via ORAL
  Filled 2018-03-14 (×2): qty 2

## 2018-03-14 MED ORDER — ONDANSETRON HCL 4 MG/2ML IJ SOLN
4.0000 mg | Freq: Once | INTRAMUSCULAR | Status: AC
  Administered 2018-03-14: 4 mg via INTRAVENOUS
  Filled 2018-03-14: qty 2

## 2018-03-14 MED ORDER — ASPIRIN 81 MG PO CHEW
324.0000 mg | CHEWABLE_TABLET | Freq: Once | ORAL | Status: AC
  Administered 2018-03-14: 324 mg via ORAL
  Filled 2018-03-14: qty 4

## 2018-03-14 NOTE — ED Provider Notes (Signed)
[facility] Emergency Department Provider Note  ____________________________________________  Time seen: Approximately 2:37 PM  I have reviewed the triage vital signs and the nursing notes.   HISTORY  Chief Complaint Shoulder Pain and Chest Pain    HPI Jamie Finley is a 57 y.o. male with a history of diabetes hypertension hyperlipidemia and previous stroke who complains of left upper arm pain that started yesterday morning, first noted when he woke up in the morning.  Constant since then, has gradually worsened and spread to include the left shoulder left chest left back and left neck.  No aggravating factors, not pleuritic, not exertional.  Feels better when he lays on that side.  Not short of breath or vomiting but did have some diaphoresis.  Never had anything like this before, but he suspects that it is muscular as he has had a fall onto that side 3 weeks ago.  Pain is currently severe and aching.      Past Medical History:  Diagnosis Date  . Arthritis   . Depression   . Diabetes mellitus without complication ([place])   . Frequent headaches   . Gout   . Hyperlipidemia   . Hypertension   . Stroke [facility])      Patient Active Problem List   Diagnosis Date Noted  . Acute gout of right elbow 09/25/2017  . Bilateral knee pain 08/24/2017  . Seasonal depression ([place]) 08/24/2017  . Chronic low back pain 02/27/2017  . Acute pain of left hip 11/22/2016  . History of CVA (cerebrovascular accident) 01/15/2016  . Hyperlipidemia associated with type 2 diabetes mellitus ([place]) 02/18/2015  . Peripheral autonomic neuropathy due to diabetes mellitus ([place]) 02/18/2015  . Diabetes mellitus type 2, uncontrolled, with complications ([place]) 19/75/8832  . Hypertension 12/15/2014  . Morbid obesity ([place]) 12/15/2014     Past Surgical History:  Procedure Laterality Date  . BACK SURGERY  2007   [facility]  . TONSILLECTOMY       Prior to Admission medications   Medication  Sig Start Date End Date Taking? Authorizing Provider  aspirin 81 MG tablet Take 81 mg by mouth daily.   Yes Roselee Nova, MD  clopidogrel (PLAVIX) 75 MG tablet Take 1 tablet (75 mg total) by mouth daily. 02/27/17  Yes Roselee Nova, MD  glipiZIDE (GLUCOTROL) 5 MG tablet Take 1 tablet (5 mg total) by mouth 2 (two) times daily before a meal. 12/25/17  Yes Bedsole, Amy E, MD  hydrochlorothiazide (MICROZIDE) 12.5 MG capsule TAKE ONE TABLET BY MOUTH EVERY DAY 09/21/17  Yes Bedsole, Amy E, MD  lisinopril (PRINIVIL,ZESTRIL) 10 MG tablet TAKE ONE TABLET BY MOUTH EVERY DAY 01/04/18  Yes Bedsole, Amy E, MD  metFORMIN (GLUCOPHAGE) 1000 MG tablet Take 1 tablet (1,000 mg total) by mouth 2 (two) times daily with a meal. 09/21/17  Yes Bedsole, Amy E, MD  Cholecalciferol (VITAMIN D PO) Take 2,000 Units by mouth daily.    [provider]  colchicine 0.6 MG tablet 2 tabs x 1 then repeat in 1 hour, on day 2  Start 1 tab BID until resolved flare 09/25/17   Bedsole, Amy E, MD  rosuvastatin (CRESTOR) 20 MG tablet Take 1 tablet (20 mg total) by mouth at bedtime. 09/21/17   [name], MD     Allergies Patient has no known allergies.   Family History  Problem Relation Age of Onset  . Cancer Mother        face and jaw  .  Cancer Father        kidney  . Early death Father   . Diabetes Maternal Grandmother   . Depression Maternal Grandfather   . Diabetes Maternal Grandfather   . Hearing loss Maternal Grandfather     Social History Social History   Tobacco Use  . Smoking status: Former Smoker    Last attempt to quit: 09/11/1989    Years since quitting: 28.5  . Smokeless tobacco: Never Used  Substance Use Topics  . Alcohol use: Yes  . Drug use: No    Review of Systems  Constitutional:   No fever or chills.  ENT:   No sore throat. No rhinorrhea. Cardiovascular:   Positive as above chest pain without syncope. Respiratory:   No dyspnea or cough. Gastrointestinal:   Negative for abdominal  pain, vomiting and diarrhea.  Musculoskeletal:   Negative for focal pain or swelling All other systems reviewed and are negative except as documented above in ROS and HPI.  ____________________________________________   PHYSICAL EXAM:  VITAL SIGNS: ED Triage Vitals  Enc Vitals Group     BP 03/14/18 1200 (!) 138/91     Pulse Rate 03/14/18 1345 76     Resp 03/14/18 1200 16     Temp --      Temp src --      SpO2 03/14/18 1345 98 %     Weight 03/14/18 1154 (!) 315 lb (142.9 kg)     Height 03/14/18 1154 6' (1.829 m)     Head Circumference --      Peak Flow --      Pain Score 03/14/18 1154 9     Pain Loc --      Pain Edu? --      Excl. in [place]? --     Vital signs reviewed, nursing assessments reviewed.   Constitutional:   Alert and oriented. Non-toxic appearance. Eyes:   Conjunctivae are normal. EOMI. PERRL. ENT      Head:   Normocephalic and atraumatic.      Nose:   No congestion/rhinnorhea.       Mouth/Throat:   MMM, no pharyngeal erythema. No peritonsillar mass.       Neck:   No meningismus. Full ROM. Hematological/Lymphatic/Immunilogical:   No cervical lymphadenopathy. Cardiovascular:   RRR. Symmetric bilateral radial and DP pulses.  No murmurs.  Respiratory:   Normal respiratory effort without tachypnea/retractions. Breath sounds are clear and equal bilaterally. No wheezes/rales/rhonchi. Gastrointestinal:   Soft and nontender. Non distended. There is no CVA tenderness.  No rebound, rigidity, or guarding. Genitourinary:   deferred Musculoskeletal:   Normal range of motion in all extremities. No joint effusions.  No lower extremity tenderness.  No edema.  There is tenseness and tenderness in the left upper back and left shoulder area, not exactly reproducing the patient's symptoms Neurologic:   Normal speech and language.  Motor grossly intact. No acute focal neurologic deficits are appreciated.  Skin:    Skin is warm, dry and intact. No rash noted.  No petechiae, purpura,  or bullae.  ____________________________________________    LABS (pertinent positives/negatives) (all labs ordered are listed, but only abnormal results are displayed) Labs Reviewed  BASIC METABOLIC PANEL - Abnormal; Notable for the following components:      Result Value   Glucose, Bld 131 (*)    All other components within normal limits  CBC - Abnormal; Notable for the following components:   WBC 11.9 (*)    HCT 39.4 (*)  All other components within normal limits  TROPONIN I  TROPONIN I   ____________________________________________   EKG  Interpreted by me Normal sinus rhythm rate of 83, normal axis and intervals.  Normal QRS ST segments and T waves.  No acute ischemic changes.  ____________________________________________    [id number]  Dg Chest Portable 1 View  Result Date: 03/14/2018 CLINICAL DATA:  Left chest pain, shortness of Breath EXAM: PORTABLE CHEST 1 VIEW COMPARISON:  11/14/2015 FINDINGS: Heart and mediastinal contours are within normal limits. No focal opacities or effusions. No acute bony abnormality. IMPRESSION: No active disease. Electronically Signed   By: Rolm Baptise M.D.   On: 03/14/2018 12:19   Ct Angio Chest Aorta W And/or Wo Contrast  Result Date: 03/14/2018 CLINICAL DATA:  Left shoulder, chest and back pain. History of hypertension, hyperlipidemia, diabetes and stroke. Evaluate for thoracic aortic dissection. EXAM: CT ANGIOGRAPHY CHEST WITH CONTRAST TECHNIQUE: Multidetector CT imaging of the chest was performed using the standard protocol during bolus administration of intravenous contrast. Multiplanar CT image reconstructions and MIPs were obtained to evaluate the vascular anatomy. CONTRAST:  33mL OMNIPAQUE IOHEXOL 350 MG/ML SOLN COMPARISON:  Chest radiograph-earlier same day; chest CT-12/25/2010 FINDINGS: Vascular Findings: Normal caliber of the thoracic aorta with measurements as follows. Review of the precontrast images are negative for the presence  of an intramural hematoma. No evidence of thoracic aortic dissection or periaortic stranding on this nongated examination. There is a very minimal amount of calcified atherosclerotic plaque within the distal aspect of the aortic arch (image 20, series 3). Conventional configuration of the aortic arch. The branch vessels of the aortic arch appear patent throughout their imaged course. Normal heart size. Small amount of fluid is seen within the pericardial recess. No pericardial effusion. Although this examination was not tailored for the evaluation the pulmonary arteries, there are no discrete filling defects within the central pulmonary arterial tree to suggest central pulmonary embolism. Normal caliber the main pulmonary artery. ------------------------------------------------------------- Thoracic aortic measurements: Sinotubular junction 29 mm as measured in greatest oblique coronal dimension. Proximal ascending aorta 35 mm as measured in greatest oblique axial dimension at the level of the main pulmonary artery. Aortic arch aorta 20 mm as measured in greatest oblique sagittal dimension. Proximal descending thoracic aorta 26 mm as measured in greatest oblique axial dimension at the level of the main pulmonary artery. Distal descending thoracic aorta 26 mm as measured in greatest oblique axial dimension at the level of the diaphragmatic hiatus. Review of the MIP images confirms the above findings. ------------------------------------------------------------- Nonvascular Findings: Mediastinum/Lymph Nodes: No bulky mediastinal, hilar axillary lymphadenopathy. Lungs/Pleura: Minimal dependent subpleural ground-glass atelectasis. No discrete focal airspace opacities. No pleural effusion or pneumothorax. The central pulmonary airways appear widely patent. No discrete pulmonary nodules. Upper abdomen: Limited early arterial phase evaluation the upper abdomen demonstrates diffuse decreased attenuation of the hepatic  parenchyma suggestive of hepatic steatosis. Musculoskeletal: No acute or aggressive osseous abnormalities. Stigmata of DISH within the thoracic spine. Note is made of mild asymmetric right-sided gynecomastia (image 53, series 6), similar to remote chest CT performed in 2012. Normal appearance of the thyroid gland. IMPRESSION: 1. No acute cardiopulmonary disease. Specifically, no evidence of thoracic aortic aneurysm or dissection. No evidence of central pulmonary embolism. 2. Very minimal amount of atherosclerotic plaque. Aortic Atherosclerosis (ICD10-I70.0). Electronically Signed   By: Sandi Mariscal M.D.   On: 03/14/2018 13:13    ____________________________________________   PROCEDURES Procedures  ____________________________________________  DIFFERENTIAL DIAGNOSIS   Non-STEMI, aortic dissection, pneumothorax, muscle spasm  CLINICAL IMPRESSION / ASSESSMENT AND PLAN / ED COURSE  Pertinent labs & imaging results that were available during my care of the patient were reviewed by me and considered in my medical decision making (see chart for details).    Patient presents with severe pain in the left chest arm neck and back.  Concerning for dissection versus ACS.  Initial EKG unremarkable, initial chest x-ray  negative for pneumothorax.  CT angiogram was obtained which is negative for dissection or any other acute pathology.  Due to the patient's risk factors and symptoms I have recommended to him that he be hospitalized for telemetry monitoring, serial troponins, provocative cardiac testing.  However, he refuses citing financial concerns being uninsured.  I informed him that he can receive care even though he does not have insurance but he wants to go home and manage this on the outpatient side.  He has a follow-up appointment with his primary care doctor 6 days from now already.  I will obtain a second troponin in the ED, plan to discharge [place].  He takes a baby aspirin already  which I have encouraged him to continue.   ----------------------------------------- 4:05 PM on 03/14/2018 -----------------------------------------  Now agreeable to staying in the hospital.  Vital signs remain normal, second troponin negative.  Aspirin 324 ordered.  Case discussed with the hospitalist for further evaluation.      ____________________________________________   FINAL CLINICAL IMPRESSION(S) / ED DIAGNOSES    Final diagnoses:  Chest pain with moderate risk for cardiac etiology     ED Discharge Orders    None      Portions of this note were generated with dragon dictation software. Dictation errors may occur despite best attempts at proofreading.    Carrie Mew, MD 03/14/18 [phone]

## 2018-03-14 NOTE — ED Triage Notes (Signed)
Pt comes into the ED via POV c/o left arm, shoulder, back and chest pain.  Patient presents diaphoretic and has tachypnea.  Patient denies any injury to the shoulder and denies any cardiac history.  Patient states he took a muscles reliever 2 hours ago but has had no relief at this time. Patient has HTN, hyperlipidemia and DM.

## 2018-03-14 NOTE — ED Notes (Signed)
Pt given lunch tray. Pt states feels more comfortable.

## 2018-03-14 NOTE — Plan of Care (Signed)
  Problem: Education: Goal: Knowledge of General Education information will improve Outcome: Progressing   Problem: Health Behavior/Discharge Planning: Goal: Ability to manage health-related needs will improve Outcome: Progressing   Problem: Pain Managment: Goal: General experience of comfort will improve Outcome: Progressing   Problem: Safety: Goal: Ability to remain free from injury will improve Outcome: Progressing   

## 2018-03-14 NOTE — H&P (Signed)
[facility] NAME: Jamie Finley    MR#:  [id number]  DATE OF BIRTH:  1960/11/06  DATE OF ADMISSION:  03/14/2018  PRIMARY CARE PHYSICIAN: [name], MD   REQUESTING/REFERRING PHYSICIAN: Joni Fears  CHIEF COMPLAINT:   Chief Complaint  Patient presents with  . Shoulder Pain  . Chest Pain    HISTORY OF PRESENT ILLNESS: Jamie Finley  is a 57 y.o. male with a known history of DM, Hyperlipidemia, Htn, Stroke,Gout- on ASA + plavix. Have left side chest, shoulder, neck and head are hurting since yesterday. It is constant and any movement makes it worse.  ER physician did CT angiogram , it is negative. Troponin is negative. But due to pt being high risk - he suggested further cardiac work up to r/o CAD.  PAST MEDICAL HISTORY:   Past Medical History:  Diagnosis Date  . Arthritis   . Depression   . Diabetes mellitus without complication ([place])   . Frequent headaches   . Gout   . Hyperlipidemia   . Hypertension   . Stroke [facility])     PAST SURGICAL HISTORY:  Past Surgical History:  Procedure Laterality Date  . BACK SURGERY  2007   [facility]  . TONSILLECTOMY      SOCIAL HISTORY:  Social History   Tobacco Use  . Smoking status: Former Smoker    Last attempt to quit: 09/11/1989    Years since quitting: 28.5  . Smokeless tobacco: Never Used  Substance Use Topics  . Alcohol use: Yes    FAMILY HISTORY:  Family History  Problem Relation Age of Onset  . Cancer Mother        face and jaw  . Cancer Father        kidney  . Early death Father   . Diabetes Maternal Grandmother   . Depression Maternal Grandfather   . Diabetes Maternal Grandfather   . Hearing loss Maternal Grandfather     DRUG ALLERGIES: No Known Allergies  REVIEW OF SYSTEMS:   CONSTITUTIONAL: No fever, fatigue or weakness.  EYES: No blurred or double vision.  EARS, NOSE, AND THROAT: No tinnitus or ear pain.  RESPIRATORY: No cough, shortness of breath,  wheezing or hemoptysis.  CARDIOVASCULAR: positive for chest pain,no orthopnea, edema.  GASTROINTESTINAL: No nausea, vomiting, diarrhea or abdominal pain.  GENITOURINARY: No dysuria, hematuria.  ENDOCRINE: No polyuria, nocturia,  HEMATOLOGY: No anemia, easy bruising or bleeding SKIN: No rash or lesion. MUSCULOSKELETAL: No joint pain or arthritis.   NEUROLOGIC: No tingling, numbness, weakness.  PSYCHIATRY: No anxiety or depression.   MEDICATIONS AT HOME:  Prior to Admission medications   Medication Sig Start Date End Date Taking? Authorizing Provider  aspirin 81 MG tablet Take 81 mg by mouth daily.   Yes Roselee Nova, MD  clopidogrel (PLAVIX) 75 MG tablet Take 1 tablet (75 mg total) by mouth daily. 02/27/17  Yes Roselee Nova, MD  colchicine 0.6 MG tablet 2 tabs x 1 then repeat in 1 hour, on day 2  Start 1 tab BID until resolved flare 09/25/17  Yes Bedsole, Amy E, MD  glipiZIDE (GLUCOTROL) 5 MG tablet Take 1 tablet (5 mg total) by mouth 2 (two) times daily before a meal. 12/25/17  Yes Bedsole, Amy E, MD  hydrochlorothiazide (MICROZIDE) 12.5 MG capsule TAKE ONE TABLET BY MOUTH EVERY DAY 09/21/17  Yes Bedsole, Amy E, MD  lisinopril (PRINIVIL,ZESTRIL) 10 MG tablet TAKE ONE TABLET BY  MOUTH EVERY DAY 01/04/18  Yes Bedsole, Amy E, MD  metFORMIN (GLUCOPHAGE) 1000 MG tablet Take 1 tablet (1,000 mg total) by mouth 2 (two) times daily with a meal. 09/21/17  Yes Bedsole, Amy E, MD  rosuvastatin (CRESTOR) 20 MG tablet Take 1 tablet (20 mg total) by mouth at bedtime. 09/21/17  Yes Bedsole, Amy E, MD      PHYSICAL EXAMINATION:   VITAL SIGNS: Blood pressure 132/85, pulse 69, resp. rate 16, height 6' (1.829 m), weight (!) 142.9 kg (315 lb), SpO2 96 %.  GENERAL:  57 y.o.-year-old patient lying in the bed with no acute distress.  EYES: Pupils equal, round, reactive to light and accommodation. No scleral icterus. Extraocular muscles intact.  HEENT: Head atraumatic, normocephalic. Oropharynx and  nasopharynx clear.  NECK:  Supple, no jugular venous distention. No thyroid enlargement, no tenderness.  LUNGS: Normal breath sounds bilaterally, no wheezing, rales,rhonchi or crepitation. No use of accessory muscles of respiration.  CARDIOVASCULAR: S1, S2 normal. No murmurs, rubs, or gallops.  ABDOMEN: Soft, nontender, nondistended. Bowel sounds present. No organomegaly or mass.  EXTREMITIES: No pedal edema, cyanosis, or clubbing.  NEUROLOGIC: Cranial nerves II through XII are intact. Muscle strength 5/5 in all extremities. Sensation intact. Gait not checked.  PSYCHIATRIC: The patient is alert and oriented x 3.  SKIN: No obvious rash, lesion, or ulcer.   LABORATORY PANEL:   CBC Recent Labs  Lab 03/14/18 1206  WBC 11.9*  HGB 13.7  HCT 39.4*  PLT 270  MCV 83.9  MCH 29.1  MCHC 34.7  RDW 14.5   ------------------------------------------------------------------------------------------------------------------  Chemistries  Recent Labs  Lab 03/14/18 1206  NA 137  K 4.0  CL 103  CO2 24  GLUCOSE 131*  BUN 19  CREATININE 1.05  CALCIUM 9.1   ------------------------------------------------------------------------------------------------------------------ estimated creatinine clearance is 113.9 mL/min (by C-G formula based on SCr of 1.05 mg/dL). ------------------------------------------------------------------------------------------------------------------ No results for input(s): TSH, T4TOTAL, T3FREE, THYROIDAB in the last 72 hours.  Invalid input(s): FREET3   Coagulation profile No results for input(s): INR, PROTIME in the last 168 hours. ------------------------------------------------------------------------------------------------------------------- No results for input(s): DDIMER in the last 72 hours. -------------------------------------------------------------------------------------------------------------------  Cardiac Enzymes Recent Labs  Lab 03/14/18 1206  03/14/18 1505  TROPONINI <0.03 <0.03   ------------------------------------------------------------------------------------------------------------------ Invalid input(s): POCBNP  ---------------------------------------------------------------------------------------------------------------  Urinalysis    Component Value Date/Time   COLORURINE Yellow 02/05/2014 1024   APPEARANCEUR Clear 02/05/2014 1024   LABSPEC 1.021 02/05/2014 1024   PHURINE 6.0 02/05/2014 1024   GLUCOSEU >=500 02/05/2014 1024   HGBUR Negative 02/05/2014 1024   BILIRUBINUR Negative 02/05/2014 1024   KETONESUR Negative 02/05/2014 1024   PROTEINUR Negative 02/05/2014 1024   NITRITE Negative 02/05/2014 1024   LEUKOCYTESUR Negative 02/05/2014 1024     RADIOLOGY: Dg Chest Portable 1 View  Result Date: 03/14/2018 CLINICAL DATA:  Left chest pain, shortness of Breath EXAM: PORTABLE CHEST 1 VIEW COMPARISON:  11/14/2015 FINDINGS: Heart and mediastinal contours are within normal limits. No focal opacities or effusions. No acute bony abnormality. IMPRESSION: No active disease. Electronically Signed   By: Rolm Baptise M.D.   On: 03/14/2018 12:19   Ct Angio Chest Aorta W And/or Wo Contrast  Result Date: 03/14/2018 CLINICAL DATA:  Left shoulder, chest and back pain. History of hypertension, hyperlipidemia, diabetes and stroke. Evaluate for thoracic aortic dissection. EXAM: CT ANGIOGRAPHY CHEST WITH CONTRAST TECHNIQUE: Multidetector CT imaging of the chest was performed using the standard protocol during bolus administration of intravenous contrast. Multiplanar CT image reconstructions and MIPs were  obtained to evaluate the vascular anatomy. CONTRAST:  46mL OMNIPAQUE IOHEXOL 350 MG/ML SOLN COMPARISON:  Chest radiograph-earlier same day; chest CT-12/25/2010 FINDINGS: Vascular Findings: Normal caliber of the thoracic aorta with measurements as follows. Review of the precontrast images are negative for the presence of an intramural  hematoma. No evidence of thoracic aortic dissection or periaortic stranding on this nongated examination. There is a very minimal amount of calcified atherosclerotic plaque within the distal aspect of the aortic arch (image 20, series 3). Conventional configuration of the aortic arch. The branch vessels of the aortic arch appear patent throughout their imaged course. Normal heart size. Small amount of fluid is seen within the pericardial recess. No pericardial effusion. Although this examination was not tailored for the evaluation the pulmonary arteries, there are no discrete filling defects within the central pulmonary arterial tree to suggest central pulmonary embolism. Normal caliber the main pulmonary artery. ------------------------------------------------------------- Thoracic aortic measurements: Sinotubular junction 29 mm as measured in greatest oblique coronal dimension. Proximal ascending aorta 35 mm as measured in greatest oblique axial dimension at the level of the main pulmonary artery. Aortic arch aorta 20 mm as measured in greatest oblique sagittal dimension. Proximal descending thoracic aorta 26 mm as measured in greatest oblique axial dimension at the level of the main pulmonary artery. Distal descending thoracic aorta 26 mm as measured in greatest oblique axial dimension at the level of the diaphragmatic hiatus. Review of the MIP images confirms the above findings. ------------------------------------------------------------- Nonvascular Findings: Mediastinum/Lymph Nodes: No bulky mediastinal, hilar axillary lymphadenopathy. Lungs/Pleura: Minimal dependent subpleural ground-glass atelectasis. No discrete focal airspace opacities. No pleural effusion or pneumothorax. The central pulmonary airways appear widely patent. No discrete pulmonary nodules. Upper abdomen: Limited early arterial phase evaluation the upper abdomen demonstrates diffuse decreased attenuation of the hepatic parenchyma suggestive  of hepatic steatosis. Musculoskeletal: No acute or aggressive osseous abnormalities. Stigmata of DISH within the thoracic spine. Note is made of mild asymmetric right-sided gynecomastia (image 53, series 6), similar to remote chest CT performed in 2012. Normal appearance of the thyroid gland. IMPRESSION: 1. No acute cardiopulmonary disease. Specifically, no evidence of thoracic aortic aneurysm or dissection. No evidence of central pulmonary embolism. 2. Very minimal amount of atherosclerotic plaque. Aortic Atherosclerosis (ICD10-I70.0). Electronically Signed   By: Sandi Mariscal M.D.   On: 03/14/2018 13:13    EKG: Orders placed or performed during the hospital encounter of 03/14/18  . EKG 12-Lead  . EKG 12-Lead  . ED EKG within 10 minutes  . ED EKG within 10 minutes    IMPRESSION AND PLAN:  * Chest pain   Monitor on tele, serial troponin   Cont ASA+ plavix+ Statin.   NM stress test.  * DM   Cont home meds, keep on ISS  * Htn   Cont home meds  * hyperlipidemia   COnt statin.  * Hx of CVA   Cont ASA, plavix.  All the records are reviewed and case discussed with ED provider. Management plans discussed with the patient, family and they are in agreement.  CODE STATUS: full. Code Status History    Date Active Date Inactive Code Status Order ID Comments User Context   01/15/2016 2347 01/16/2016 2027 Full Code [id number]  Lance Coon, MD ED       TOTAL TIME TAKING CARE OF THIS PATIENT: 35 minutes.    Vaughan Basta M.D on 03/14/2018   Between 7am to 6pm - Pager - [phone]  After 6pm go to [email or web address] - [place]  [phone]  CC: Primary care physician; [name], MD   Note: This dictation was prepared with Dragon dictation along with smaller phrase technology. Any transcriptional errors that result from this process are unintentional.

## 2018-03-14 NOTE — ED Notes (Signed)
Report to steve 2a

## 2018-03-14 NOTE — ED Notes (Signed)
Pt placed on cardiac monitor. MD and RN at bedside.

## 2018-03-15 ENCOUNTER — Other Ambulatory Visit: Payer: Self-pay

## 2018-03-15 ENCOUNTER — Observation Stay: Payer: Self-pay

## 2018-03-15 ENCOUNTER — Telehealth: Payer: Self-pay | Admitting: Family Medicine

## 2018-03-15 DIAGNOSIS — IMO0002 Reserved for concepts with insufficient information to code with codable children: Secondary | ICD-10-CM

## 2018-03-15 DIAGNOSIS — E1165 Type 2 diabetes mellitus with hyperglycemia: Secondary | ICD-10-CM

## 2018-03-15 DIAGNOSIS — E118 Type 2 diabetes mellitus with unspecified complications: Principal | ICD-10-CM

## 2018-03-15 LAB — TROPONIN I

## 2018-03-15 LAB — BASIC METABOLIC PANEL
Anion gap: 9 (ref 5–15)
BUN: 24 mg/dL — ABNORMAL HIGH (ref 6–20)
CHLORIDE: 103 mmol/L (ref 98–111)
CO2: 25 mmol/L (ref 22–32)
CREATININE: 1.25 mg/dL — AB (ref 0.61–1.24)
Calcium: 8.7 mg/dL — ABNORMAL LOW (ref 8.9–10.3)
Glucose, Bld: 150 mg/dL — ABNORMAL HIGH (ref 70–99)
Potassium: 3.6 mmol/L (ref 3.5–5.1)
SODIUM: 137 mmol/L (ref 135–145)

## 2018-03-15 LAB — CBC
HCT: 35.9 % — ABNORMAL LOW (ref 40.0–52.0)
Hemoglobin: 12.2 g/dL — ABNORMAL LOW (ref 13.0–18.0)
MCH: 28.7 pg (ref 26.0–34.0)
MCHC: 34.1 g/dL (ref 32.0–36.0)
MCV: 84.1 fL (ref 80.0–100.0)
PLATELETS: 252 10*3/uL (ref 150–440)
RBC: 4.27 MIL/uL — AB (ref 4.40–5.90)
RDW: 14.2 % (ref 11.5–14.5)
WBC: 8.1 10*3/uL (ref 3.8–10.6)

## 2018-03-15 LAB — GLUCOSE, CAPILLARY
Glucose-Capillary: 121 mg/dL — ABNORMAL HIGH (ref 70–99)
Glucose-Capillary: 181 mg/dL — ABNORMAL HIGH (ref 70–99)
Glucose-Capillary: 252 mg/dL — ABNORMAL HIGH (ref 70–99)
Glucose-Capillary: 253 mg/dL — ABNORMAL HIGH (ref 70–99)

## 2018-03-15 LAB — SEDIMENTATION RATE: SED RATE: 52 mm/h — AB (ref 0–20)

## 2018-03-15 MED ORDER — METHYLPREDNISOLONE SODIUM SUCC 40 MG IJ SOLR
40.0000 mg | Freq: Three times a day (TID) | INTRAMUSCULAR | Status: DC
  Administered 2018-03-15: 40 mg via INTRAVENOUS
  Filled 2018-03-15: qty 1

## 2018-03-15 MED ORDER — TRIAMCINOLONE ACETONIDE 40 MG/ML IJ SUSP
80.0000 mg | Freq: Once | INTRAMUSCULAR | Status: AC
  Administered 2018-03-15: 80 mg via INTRA_ARTICULAR
  Filled 2018-03-15: qty 2

## 2018-03-15 MED ORDER — OXYCODONE HCL 5 MG PO TABS
5.0000 mg | ORAL_TABLET | Freq: Four times a day (QID) | ORAL | Status: DC | PRN
  Administered 2018-03-15 (×2): 5 mg via ORAL
  Filled 2018-03-15 (×3): qty 1

## 2018-03-15 MED ORDER — BUPIVACAINE HCL (PF) 0.5 % IJ SOLN
10.0000 mL | Freq: Once | INTRAMUSCULAR | Status: AC
  Administered 2018-03-15: 10 mL
  Filled 2018-03-15: qty 10

## 2018-03-15 MED ORDER — OXYCODONE-ACETAMINOPHEN 5-325 MG PO TABS
1.0000 | ORAL_TABLET | Freq: Once | ORAL | Status: AC
  Administered 2018-03-15: 1 via ORAL
  Filled 2018-03-15: qty 1

## 2018-03-15 MED ORDER — CYCLOBENZAPRINE HCL 10 MG PO TABS
5.0000 mg | ORAL_TABLET | Freq: Three times a day (TID) | ORAL | Status: DC | PRN
  Filled 2018-03-15: qty 1

## 2018-03-15 MED ORDER — LIDOCAINE-EPINEPHRINE 1 %-1:100000 IJ SOLN
10.0000 mL | Freq: Once | INTRAMUSCULAR | Status: AC
  Administered 2018-03-15: 10 mL
  Filled 2018-03-15: qty 10

## 2018-03-15 NOTE — Progress Notes (Signed)
Patient ID: Jamie Finley, male   DOB: [birth year], 57 y.o.   MRN: [id number]  [facility] PROGRESS NOTE  Jamie Finley DXI:[id number] DOB: 1961-06-18 DOA: 03/14/2018 PCP: Jamie Sanders, MD  HPI/Subjective: Patient having pain in his left shoulder left side of his neck and left upper chest.  Worse with any type of movement.  He can hardly move his left shoulder.  He had a fall a few weeks back.  Objective: Vitals:   03/15/18 0332 03/15/18 0833  BP: 102/69 119/69  Pulse: 63 64  Resp: 18 18  Temp: 98 F (36.7 C) 98.6 F (37 C)  SpO2: 98% 95%    Filed Weights   03/14/18 1154  Weight: (!) 142.9 kg (315 lb)    ROS: Review of Systems  Constitutional: Negative for chills and fever.  Eyes: Negative for blurred vision.  Respiratory: Negative for cough and shortness of breath.   Cardiovascular: Negative for chest pain.  Gastrointestinal: Negative for abdominal pain, constipation, diarrhea, nausea and vomiting.  Genitourinary: Negative for dysuria.  Musculoskeletal: Positive for joint pain.  Neurological: Negative for dizziness and headaches.   Exam: Physical Exam  Constitutional: He is oriented to person, place, and time.  HENT:  Nose: No mucosal edema.  Mouth/Throat: No oropharyngeal exudate or posterior oropharyngeal edema.  Eyes: Pupils are equal, round, and reactive to light. Conjunctivae, EOM and lids are normal.  Neck: No JVD present. Carotid bruit is not present. No edema present. No thyroid mass and no thyromegaly present.  Cardiovascular: S1 normal and S2 normal. Exam reveals no gallop.  No murmur heard. Pulses:      Dorsalis pedis pulses are 2+ on the right side, and 2+ on the left side.  Respiratory: No respiratory distress. He has no wheezes. He has no rhonchi. He has no rales.  GI: Soft. Bowel sounds are normal. There is no tenderness.  Musculoskeletal:       Left shoulder: He exhibits decreased range of motion and tenderness.       Right ankle: He  exhibits no swelling.       Left ankle: He exhibits no swelling.  Lymphadenopathy:    He has no cervical adenopathy.  Neurological: He is alert and oriented to person, place, and time. No cranial nerve deficit.  Skin: Skin is warm. No rash noted. Nails show no clubbing.  Psychiatric: He has a normal mood and affect.      Data Reviewed: Basic Metabolic Panel: Recent Labs  Lab 03/14/18 1206 03/15/18 0045  NA 137 137  K 4.0 3.6  CL 103 103  CO2 24 25  GLUCOSE 131* 150*  BUN 19 24*  CREATININE 1.05 1.25*  CALCIUM 9.1 8.7*   CBC: Recent Labs  Lab 03/14/18 1206 03/15/18 0045  WBC 11.9* 8.1  HGB 13.7 12.2*  HCT 39.4* 35.9*  MCV 83.9 84.1  PLT 270 252   Cardiac Enzymes: Recent Labs  Lab 03/14/18 1206 03/14/18 1505 03/14/18 1847 03/14/18 2255 03/15/18 0036  TROPONINI <0.03 <0.03 <0.03 <0.03 <0.03    CBG: Recent Labs  Lab 03/14/18 1837 03/14/18 2104 03/15/18 0836 03/15/18 1201  GLUCAP 137* 138* 121* 181*     Studies: Dg Cervical Spine 2 Or 3 Views  Result Date: 03/15/2018 CLINICAL DATA:  Neck pain radiating to LEFT shoulder, pain when turning head from side to side, neck stiffness, limited range of motion LEFT shoulder EXAM: CERVICAL SPINE - 2-3 VIEW COMPARISON:  None FINDINGS: Osseous mineralization normal. Prevertebral soft tissues normal thickness. Disc  space narrowing with endplate spur formation at C6-C7. Vertebral body and disc space heights otherwise maintained. No fracture, subluxation or bone destruction. C1-C2 alignment normal. 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 No acute abnormalities. Electronically Signed   By: Lavonia Dana M.D.   On: 03/15/2018 08:29   Dg Chest Portable 1 View  Result Date: 03/14/2018 CLINICAL DATA:  Left chest pain, shortness of Breath EXAM: PORTABLE CHEST 1 VIEW COMPARISON:  11/14/2015 FINDINGS: Heart and mediastinal contours are within normal limits. No focal opacities or effusions. No acute bony abnormality.  IMPRESSION: No active disease. Electronically Signed   By: Rolm Baptise M.D.   On: 03/14/2018 12:19   Dg Shoulder Left  Result Date: 03/15/2018 CLINICAL DATA:  Neck pain radiating to LEFT shoulder, pain when turning head from side to side, neck stiffness, limited range of motion LEFT shoulder EXAM: LEFT SHOULDER - 2+ VIEW COMPARISON:  None FINDINGS: Osseous mineralization normal. AC joint alignment normal. No acute fracture, dislocation or bone destruction. Visualized ribs unremarkable. IMPRESSION: Normal exam. Electronically Signed   By: Lavonia Dana M.D.   On: 03/15/2018 08:27   Ct Angio Chest Aorta W And/or Wo Contrast  Result Date: 03/14/2018 CLINICAL DATA:  Left shoulder, chest and back pain. History of hypertension, hyperlipidemia, diabetes and stroke. Evaluate for thoracic aortic dissection. EXAM: CT ANGIOGRAPHY CHEST WITH CONTRAST TECHNIQUE: Multidetector CT imaging of the chest was performed using the standard protocol during bolus administration of intravenous contrast. Multiplanar CT image reconstructions and MIPs were obtained to evaluate the vascular anatomy. CONTRAST:  27mL OMNIPAQUE IOHEXOL 350 MG/ML SOLN COMPARISON:  Chest radiograph-earlier same day; chest CT-12/25/2010 FINDINGS: Vascular Findings: Normal caliber of the thoracic aorta with measurements as follows. Review of the precontrast images are negative for the presence of an intramural hematoma. No evidence of thoracic aortic dissection or periaortic stranding on this nongated examination. There is a very minimal amount of calcified atherosclerotic plaque within the distal aspect of the aortic arch (image 20, series 3). Conventional configuration of the aortic arch. The branch vessels of the aortic arch appear patent throughout their imaged course. Normal heart size. Small amount of fluid is seen within the pericardial recess. No pericardial effusion. Although this examination was not tailored for the evaluation the pulmonary arteries,  there are no discrete filling defects within the central pulmonary arterial tree to suggest central pulmonary embolism. Normal caliber the main pulmonary artery. ------------------------------------------------------------- Thoracic aortic measurements: Sinotubular junction 29 mm as measured in greatest oblique coronal dimension. Proximal ascending aorta 35 mm as measured in greatest oblique axial dimension at the level of the main pulmonary artery. Aortic arch aorta 20 mm as measured in greatest oblique sagittal dimension. Proximal descending thoracic aorta 26 mm as measured in greatest oblique axial dimension at the level of the main pulmonary artery. Distal descending thoracic aorta 26 mm as measured in greatest oblique axial dimension at the level of the diaphragmatic hiatus. Review of the MIP images confirms the above findings. ------------------------------------------------------------- Nonvascular Findings: Mediastinum/Lymph Nodes: No bulky mediastinal, hilar axillary lymphadenopathy. Lungs/Pleura: Minimal dependent subpleural ground-glass atelectasis. No discrete focal airspace opacities. No pleural effusion or pneumothorax. The central pulmonary airways appear widely patent. No discrete pulmonary nodules. Upper abdomen: Limited early arterial phase evaluation the upper abdomen demonstrates diffuse decreased attenuation of the hepatic parenchyma suggestive of hepatic steatosis. Musculoskeletal: No acute or aggressive osseous abnormalities. Stigmata of DISH within the thoracic spine. Note is made of mild asymmetric right-sided gynecomastia (image 53, series 6), similar to remote  chest CT performed in 2012. Normal appearance of the thyroid gland. IMPRESSION: 1. No acute cardiopulmonary disease. Specifically, no evidence of thoracic aortic aneurysm or dissection. No evidence of central pulmonary embolism. 2. Very minimal amount of atherosclerotic plaque. Aortic Atherosclerosis (ICD10-I70.0). Electronically  Signed   By: Sandi Mariscal M.D.   On: 03/14/2018 13:13    Scheduled Meds: . aspirin EC  81 mg Oral QPM  . bupivacaine  10 mL Infiltration Once  . clopidogrel  75 mg Oral Daily  . colchicine  0.6 mg Oral Daily  . glipiZIDE  5 mg Oral BID AC  . heparin  5,000 Units Subcutaneous Q8H  . hydrochlorothiazide  12.5 mg Oral Daily  . insulin aspart  0-9 Units Subcutaneous TID WC  . lidocaine-EPINEPHrine  10 mL Other Once  . lisinopril  10 mg Oral Daily  . metFORMIN  1,000 mg Oral BID WC  . rosuvastatin  20 mg Oral QHS  . triamcinolone acetonide  80 mg Intra-articular Once   Continuous Infusions:  Assessment/Plan:  1. Left shoulder pain and neck pain.  Start Solu-Medrol 40 mg IV every 8 hours.  Orthopedic consultation.  I ordered x-ray cervical spine  which showed degenerative changes C6 and C7.  Left shoulder x-ray negative.  I also started PRN Flexeril.  Ordered for occupational therapy consultation.  Patient unable to get an MRI secondary to severe claustrophobia.  If this is inflammatory and should be a little bit better tomorrow. 2. Essential hypertension on hydrochlorothiazide and lisinopril 3. Type 2 diabetes mellitus on glipizide and metformin.  Sugars will be high on steroids. 4. Hyperlipidemia unspecified on Crestor 5. History of stroke on aspirin and Plavix 6. Weakness.  Physical therapy evaluation 7. Chest pain likely related to shoulder and neck pain.  Stress test canceled cardiac enzymes negative x3.  Code Status:     Code Status Orders  (From admission, onward)        Start     Ordered   03/14/18 1829  Full code  Continuous     03/14/18 1828    Code Status History    Date Active Date Inactive Code Status Order ID Comments User Context   01/15/2016 2347 01/16/2016 2027 Full Code [id number]  Lance Coon, MD ED     Family Communication: When I came back into the room he did have some people at the bedside and I was given permission to speak medically in front of  them. Disposition Plan: Hopefully home tomorrow  Consultants:  Orthopedic surgery  Time spent: 33 minutes  [place]

## 2018-03-15 NOTE — Progress Notes (Addendum)
Pt complaining of neck and shoulder pain 3 out 10. Page prime. Awaiting callback. Will continue to monitor.  Update 0615. Doctor Jodell Cipro ordered oxycodone 5-325 mg per tablet 1-2 tablet. Will continue to monitor.

## 2018-03-15 NOTE — Progress Notes (Signed)
OT Cancellation Note  Patient Details Name: Jamie Finley MRN: [id number] DOB: [birth year]   Cancelled Treatment:    Reason Eval/Treat Not Completed: Other (comment). Order received, chart reviewed. Pt pending orthopedic consult for L shoulder pain. Will hold OT evaluation at this time and re-attempt at later date/time pending updated POC per MD to clarify LUE weightbearing precautions/restrictions prior to mobility and ADL assessment.   Jeni Salles, MPH, MS, OTR/L ascom [phone] 03/15/18, 10:26 AM

## 2018-03-15 NOTE — Telephone Encounter (Signed)
-----   Message from Lendon Collar, RT sent at 03/04/2018 11:51 AM EDT ----- Regarding: Lab orders for Friday 03/15/18 Please enter lab orders for upcoming 3 month follow-up. Thanks-Lauren

## 2018-03-15 NOTE — Progress Notes (Signed)
PT Cancellation Note  Patient Details Name: Jamie Finley MRN: [id number] DOB: [birth year]   Cancelled Treatment:    Reason Eval/Treat Not Completed: Other (comment).  PT consult received.  Chart reviewed.  Pt pending orthopedic consult for L shoulder pain (per nurse Ortho MD coming to see pt soon to address L shoulder).  Per discussion with nurse, will hold PT evaluation at this time and re-attempt at a later date/time as medically appropriate.  Leitha Bleak, PT 03/15/18, 3:13 PM [phone]

## 2018-03-15 NOTE — Consult Note (Signed)
ORTHOPAEDIC CONSULTATION  REQUESTING PHYSICIAN: Loletha Grayer, MD  Chief Complaint:   Left shoulder and neck pain  History of Present Illness: Jamie Finley is a 57 y.o. male with left shoulder and neck pain.  Pain began 2 days ago without traumatic event.  Patient notes pain was 10 out of 10 in severity and was so bad that he presented to the emergency department because of this pain.  He underwent cardiac work-up in the the emergency department due to concern for chest pain, and this was negative.  Currently, pain is described as a sharp, stabbing pain with certain movements and a dull ache at other times.  Pain is worse with attempts to reach over his head as well as with sudden reaching movements.  Pain is improved with rest.  He also notes pain is been improved since he got some steroid medications earlier in the hospital stay.  Pain is located over the anterior aspect the shoulder as well as about the left paraspinal musculature.  Past Medical History:  Diagnosis Date  . Arthritis   . Depression   . Diabetes mellitus without complication ([place])   . Frequent headaches   . Gout   . Hyperlipidemia   . Hypertension   . Stroke [facility])    Past Surgical History:  Procedure Laterality Date  . BACK SURGERY  2007   [facility]  . TONSILLECTOMY     Social History   Socioeconomic History  . Marital status: Single    Spouse name: Not on file  . Number of children: Not on file  . Years of education: Not on file  . Highest education level: Not on file  Occupational History  . Not on file  Social Needs  . Financial resource strain: Not on file  . Food insecurity:    Worry: Not on file    Inability: Not on file  . Transportation needs:    Medical: Not on file    Non-medical: Not on file  Tobacco Use  . Smoking status: Former Smoker    Last attempt to quit: 09/11/1989    Years since quitting: 28.5  . Smokeless tobacco:  Never Used  Substance and Sexual Activity  . Alcohol use: Yes  . Drug use: No  . Sexual activity: Not Currently  Lifestyle  . Physical activity:    Days per week: Not on file    Minutes per session: Not on file  . Stress: Not on file  Relationships  . Social connections:    Talks on phone: Not on file    Gets together: Not on file    Attends religious service: Not on file    Active member of club or organization: Not on file    Attends meetings of clubs or organizations: Not on file    Relationship status: Not on file  Other Topics Concern  . Not on file  Social History Narrative  . Not on file   Family History  Problem Relation Age of Onset  . Cancer Mother        face and jaw  . Cancer Father        kidney  . Early death Father   . Diabetes Maternal Grandmother   . Depression Maternal Grandfather   . Diabetes Maternal Grandfather   . Hearing loss Maternal Grandfather    No Known Allergies Prior to Admission medications   Medication Sig Start Date End Date Taking? Authorizing Provider  aspirin 81 MG tablet Take 81 mg by  mouth daily.   Yes Roselee Nova, MD  clopidogrel (PLAVIX) 75 MG tablet Take 1 tablet (75 mg total) by mouth daily. 02/27/17  Yes Roselee Nova, MD  colchicine 0.6 MG tablet 2 tabs x 1 then repeat in 1 hour, on day 2  Start 1 tab BID until resolved flare 09/25/17  Yes Bedsole, Amy E, MD  glipiZIDE (GLUCOTROL) 5 MG tablet Take 1 tablet (5 mg total) by mouth 2 (two) times daily before a meal. 12/25/17  Yes Bedsole, Amy E, MD  hydrochlorothiazide (MICROZIDE) 12.5 MG capsule TAKE ONE TABLET BY MOUTH EVERY DAY 09/21/17  Yes Bedsole, Amy E, MD  lisinopril (PRINIVIL,ZESTRIL) 10 MG tablet TAKE ONE TABLET BY MOUTH EVERY DAY 01/04/18  Yes Bedsole, Amy E, MD  metFORMIN (GLUCOPHAGE) 1000 MG tablet Take 1 tablet (1,000 mg total) by mouth 2 (two) times daily with a meal. 09/21/17  Yes Bedsole, Amy E, MD  rosuvastatin (CRESTOR) 20 MG tablet Take 1 tablet (20 mg total)  by mouth at bedtime. 09/21/17  Yes Bedsole, Amy E, MD   Recent Labs    03/14/18 1206 03/15/18 0045  WBC 11.9* 8.1  HGB 13.7 12.2*  HCT 39.4* 35.9*  PLT 270 252  K 4.0 3.6  CL 103 103  CO2 24 25  BUN 19 24*  CREATININE 1.05 1.25*  GLUCOSE 131* 150*  CALCIUM 9.1 8.7*   Dg Cervical Spine 2 Or 3 Views  Result Date: 03/15/2018 CLINICAL DATA:  Neck pain radiating to LEFT shoulder, pain when turning head from side to side, neck stiffness, limited range of motion LEFT shoulder EXAM: CERVICAL SPINE - 2-3 VIEW COMPARISON:  None FINDINGS: Osseous mineralization normal. Prevertebral soft tissues normal thickness. Disc space narrowing with endplate spur formation at C6-C7. Vertebral body and disc space heights otherwise maintained. No fracture, subluxation or bone destruction. C1-C2 alignment normal. 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 No acute abnormalities. Electronically Signed   By: Lavonia Dana M.D.   On: 03/15/2018 08:29   Dg Chest Portable 1 View  Result Date: 03/14/2018 CLINICAL DATA:  Left chest pain, shortness of Breath EXAM: PORTABLE CHEST 1 VIEW COMPARISON:  11/14/2015 FINDINGS: Heart and mediastinal contours are within normal limits. No focal opacities or effusions. No acute bony abnormality. IMPRESSION: No active disease. Electronically Signed   By: Rolm Baptise M.D.   On: 03/14/2018 12:19   Dg Shoulder Left  Result Date: 03/15/2018 CLINICAL DATA:  Neck pain radiating to LEFT shoulder, pain when turning head from side to side, neck stiffness, limited range of motion LEFT shoulder EXAM: LEFT SHOULDER - 2+ VIEW COMPARISON:  None FINDINGS: Osseous mineralization normal. AC joint alignment normal. No acute fracture, dislocation or bone destruction. Visualized ribs unremarkable. IMPRESSION: Normal exam. Electronically Signed   By: Lavonia Dana M.D.   On: 03/15/2018 08:27   Ct Angio Chest Aorta W And/or Wo Contrast  Result Date: 03/14/2018 CLINICAL DATA:  Left shoulder, chest  and back pain. History of hypertension, hyperlipidemia, diabetes and stroke. Evaluate for thoracic aortic dissection. EXAM: CT ANGIOGRAPHY CHEST WITH CONTRAST TECHNIQUE: Multidetector CT imaging of the chest was performed using the standard protocol during bolus administration of intravenous contrast. Multiplanar CT image reconstructions and MIPs were obtained to evaluate the vascular anatomy. CONTRAST:  72mL OMNIPAQUE IOHEXOL 350 MG/ML SOLN COMPARISON:  Chest radiograph-earlier same day; chest CT-12/25/2010 FINDINGS: Vascular Findings: Normal caliber of the thoracic aorta with measurements as follows. Review of the precontrast images are negative for the  presence of an intramural hematoma. No evidence of thoracic aortic dissection or periaortic stranding on this nongated examination. There is a very minimal amount of calcified atherosclerotic plaque within the distal aspect of the aortic arch (image 20, series 3). Conventional configuration of the aortic arch. The branch vessels of the aortic arch appear patent throughout their imaged course. Normal heart size. Small amount of fluid is seen within the pericardial recess. No pericardial effusion. Although this examination was not tailored for the evaluation the pulmonary arteries, there are no discrete filling defects within the central pulmonary arterial tree to suggest central pulmonary embolism. Normal caliber the main pulmonary artery. ------------------------------------------------------------- Thoracic aortic measurements: Sinotubular junction 29 mm as measured in greatest oblique coronal dimension. Proximal ascending aorta 35 mm as measured in greatest oblique axial dimension at the level of the main pulmonary artery. Aortic arch aorta 20 mm as measured in greatest oblique sagittal dimension. Proximal descending thoracic aorta 26 mm as measured in greatest oblique axial dimension at the level of the main pulmonary artery. Distal descending thoracic aorta 26  mm as measured in greatest oblique axial dimension at the level of the diaphragmatic hiatus. Review of the MIP images confirms the above findings. ------------------------------------------------------------- Nonvascular Findings: Mediastinum/Lymph Nodes: No bulky mediastinal, hilar axillary lymphadenopathy. Lungs/Pleura: Minimal dependent subpleural ground-glass atelectasis. No discrete focal airspace opacities. No pleural effusion or pneumothorax. The central pulmonary airways appear widely patent. No discrete pulmonary nodules. Upper abdomen: Limited early arterial phase evaluation the upper abdomen demonstrates diffuse decreased attenuation of the hepatic parenchyma suggestive of hepatic steatosis. Musculoskeletal: No acute or aggressive osseous abnormalities. Stigmata of DISH within the thoracic spine. Note is made of mild asymmetric right-sided gynecomastia (image 53, series 6), similar to remote chest CT performed in 2012. Normal appearance of the thyroid gland. IMPRESSION: 1. No acute cardiopulmonary disease. Specifically, no evidence of thoracic aortic aneurysm or dissection. No evidence of central pulmonary embolism. 2. Very minimal amount of atherosclerotic plaque. Aortic Atherosclerosis (ICD10-I70.0). Electronically Signed   By: Sandi Mariscal M.D.   On: 03/14/2018 13:13     Positive ROS: All other systems have been reviewed and were otherwise negative with the exception of those mentioned in the HPI and as above.  Physical Exam: BP 120/71 (BP Location: Left Arm)   Pulse 71   Temp 98.3 F (36.8 C) (Oral)   Resp 18   Ht 6' (1.829 m)   Wt (!) 142.9 kg (315 lb)   SpO2 94%   BMI 42.72 kg/m  General:  Alert, no acute distress Psychiatric:  Patient is competent for consent with normal mood and affect   Cardiovascular:  No pedal edema, regular rate and rhythm Respiratory:  No wheezing, non-labored breathing GI:  Abdomen is soft and non-tender Skin:  No lesions in the area of chief complaint,  no erythema Neurologic:  Sensation intact distally, CN grossly intact Lymphatic:  No axillary or cervical lymphadenopathy  Orthopedic Exam:  LUE: +ain/pin/u motor SILT r/u/m/ax +rad pulse RoM: 100 forward flexion actively and passively, 50 ER with arm at side, 80 ER with arm abducted, 40 IR with arm abducted, IR behind back to T12 Also has signs of capsulitis present with painful external rotation with sudden motions 5 out of 5 strength to rotator cuff testing (empty can, ER strength, bearhug, belly press) Tenderness to palpation over anterior capsule and biceps.    X-rays:  As above: No significant degenerative change of the glenohumeral joint.  Degenerative changes of the cervical spine most prominent  at C6-7.  Assessment/Plan: 57 year old male with likely adhesive capsulitis of the left shoulder with some cervical muscular pain as well. 1.  We discussed the natural history of adhesive capsulitis.  We discussed the various treatment options.  After discussion of risks, benefits, and alternatives, the patient agreed to proceed with corticosteroid injection to the glenohumeral joint as well as the subacromial space.  Please see procedure note below for details.  2.  Recommend very gentle passive stretching exercises of the left shoulder in forward flexion, external rotation, and in internal rotation.  These motion should not cause a sharp pain, but may cause some mild soreness.  These can be demonstrated by the physical therapy team tomorrow and should be performed at home daily.  3.  IV steroids were discontinued.  Careful monitoring of blood sugars were recommended over the next few days.  4.  Recommend NSAIDs and muscle relaxants as needed for cervical muscle strain.  5.  Follow-up with me as an outpatient in approximately 4 weeks.  If he still having symptoms at that point, we can consider repeat corticosteroid injection.   Procedure Note - Combined Glenohumeral and Subacromial  Cortisone Injection - Left I reviewed with the patient the procedure of L combined glenohumeral and subacromial injection and discussed the risks, benefits, and alternative treatments. We discussed the potential risks of infection, allergic reaction, increased pain, incomplete relief or temporary relief of symptoms, alterations of blood glucose levels requiring careful monitoring and treatment as indicated, tendon, ligament or articular cartilage rupture or degeneration, nerve injury, and/or skin depigmentation. Verbal consent was obtained.  A time-out was conducted to verify correct patient identity, procedure to be performed, and correct side and site. The skin was marked and then prepped in usual sterile fashion. I then injected half (58ml) of a mixture of 2 mL of Kenalog 80mg , 4 mL of 1% Lidocaine, and 4 mL of 0.5% Ropivicaine into the glenohumeral joint using a 22g needle. I then redirected the needle into the subacromial space and the remaining solution (15ml) was injected without complication. The patient tolerated the procedure well and noted significant relief of pain after the procedure. Aftercare was discussed.   Leim Fabry   03/15/2018 9:03 PM

## 2018-03-16 ENCOUNTER — Other Ambulatory Visit: Payer: Self-pay

## 2018-03-16 LAB — GLUCOSE, CAPILLARY
GLUCOSE-CAPILLARY: 152 mg/dL — AB (ref 70–99)
GLUCOSE-CAPILLARY: 222 mg/dL — AB (ref 70–99)

## 2018-03-16 LAB — HIV ANTIBODY (ROUTINE TESTING W REFLEX): HIV SCREEN 4TH GENERATION: NONREACTIVE

## 2018-03-16 MED ORDER — MELOXICAM 7.5 MG PO TABS: 7.5000 mg | ORAL_TABLET | Freq: Every day | ORAL | Status: DC

## 2018-03-16 MED ORDER — MELOXICAM 7.5 MG PO TABS: 7.5000 mg | ORAL_TABLET | Freq: Every day | ORAL | 0 refills | Status: DC

## 2018-03-16 MED ORDER — OXYCODONE HCL 5 MG PO TABS: 5.0000 mg | ORAL_TABLET | Freq: Four times a day (QID) | ORAL | 0 refills | Status: DC | PRN

## 2018-03-16 NOTE — Discharge Summary (Signed)
[facility] NAME: Jamie Finley    MR#:  [id number]  DATE OF BIRTH:  1961/01/11  DATE OF ADMISSION:  03/14/2018 ADMITTING PHYSICIAN: Vaughan Basta, MD  DATE OF DISCHARGE: 03/16/2018  PRIMARY CARE PHYSICIAN: [name], MD    ADMISSION DIAGNOSIS:  Chest pain with moderate risk for cardiac etiology [R07.9]  DISCHARGE DIAGNOSIS:  Adhesive capsulitis of the left shoulder  SECONDARY DIAGNOSIS:   Past Medical History:  Diagnosis Date  . Arthritis   . Depression   . Diabetes mellitus without complication ([place])   . Frequent headaches   . Gout   . Hyperlipidemia   . Hypertension   . Stroke [facility])     HOSPITAL COURSE:   1.  Adhesive capsulitis of the left shoulder with neck pain.  The patient was given systemic Solu-Medrol.  The patient was seen in consultation by Dr. Posey Pronto orthopedic surgery and he injected the shoulder with steroid.  Patient was seen in consultation by physical therapy and Occupational Therapy.  Outpatient occupational therapy was recommended.  I did prescribe meloxicam at night.  Some as needed pain medication. 2.  Essential hypertension on hydrochlorothiazide and lisinopril 3.  Type 2 diabetes mellitus on glipizide and metformin. 4.  Hyperlipidemia unspecified on Crestor 5.  History of stroke on aspirin and Plavix 6.  Chest pain.  Likely related to shoulder and neck pain.  Stress test canceled since cardiac enzymes were negative x3.  DISCHARGE CONDITIONS:   Satisfactory  CONSULTS OBTAINED:  Treatment Team:  Leim Fabry, MD  DRUG ALLERGIES:  No Known Allergies  DISCHARGE MEDICATIONS:   Allergies as of 03/16/2018   No Known Allergies     Medication List    TAKE these medications   aspirin 81 MG tablet Take 81 mg by mouth daily.   clopidogrel 75 MG tablet Commonly known as:  PLAVIX Take 1 tablet (75 mg total) by mouth daily.   colchicine 0.6 MG tablet 2 tabs x 1 then repeat in 1  hour, on day 2  Start 1 tab BID until resolved flare   glipiZIDE 5 MG tablet Commonly known as:  GLUCOTROL Take 1 tablet (5 mg total) by mouth 2 (two) times daily before a meal.   hydrochlorothiazide 12.5 MG capsule Commonly known as:  MICROZIDE TAKE ONE TABLET BY MOUTH EVERY DAY   lisinopril 10 MG tablet Commonly known as:  PRINIVIL,ZESTRIL TAKE ONE TABLET BY MOUTH EVERY DAY   meloxicam 7.5 MG tablet Commonly known as:  MOBIC Take 1 tablet (7.5 mg total) by mouth at bedtime.   metFORMIN 1000 MG tablet Commonly known as:  GLUCOPHAGE Take 1 tablet (1,000 mg total) by mouth 2 (two) times daily with a meal.   oxyCODONE 5 MG immediate release tablet Commonly known as:  Oxy IR/ROXICODONE Take 1 tablet (5 mg total) by mouth every 6 (six) hours as needed for moderate pain or severe pain.   rosuvastatin 20 MG tablet Commonly known as:  CRESTOR Take 1 tablet (20 mg total) by mouth at bedtime.            Durable Medical Equipment  (From admission, onward)        Start     Ordered   03/16/18 1139  For home use only DME Bedside commode  Once    Question:  Patient needs a bedside commode to treat with the following condition  Answer:  Unsteady gait   03/16/18 1138  DISCHARGE INSTRUCTIONS:   Follow-up PMD 6 days Follow-up orthopedic surgery 2 weeks  If you experience worsening of your admission symptoms, develop shortness of breath, life threatening emergency, suicidal or homicidal thoughts you must seek medical attention immediately by calling 911 or calling your MD immediately  if symptoms less severe.  You Must read complete instructions/literature along with all the possible adverse reactions/side effects for all the Medicines you take and that have been prescribed to you. Take any new Medicines after you have completely understood and accept all the possible adverse reactions/side effects.   Please note  You were cared for by a hospitalist during your hospital  stay. If you have any questions about your discharge medications or the care you received while you were in the hospital after you are discharged, you can call the unit and asked to speak with the hospitalist on call if the hospitalist that took care of you is not available. Once you are discharged, your primary care physician will handle any further medical issues. Please note that NO REFILLS for any discharge medications will be authorized once you are discharged, as it is imperative that you return to your primary care physician (or establish a relationship with a primary care physician if you do not have one) for your aftercare needs so that they can reassess your need for medications and monitor your lab values.    Today   CHIEF COMPLAINT:   Chief Complaint  Patient presents with  . Shoulder Pain  . Chest Pain    HISTORY OF PRESENT ILLNESS:  Jamie Finley  is a 57 y.o. male with a known history of presented with shoulder pain and chest pain   VITAL SIGNS:  Blood pressure 117/69, pulse (!) 55, temperature 98.3 F (36.8 C), temperature source Oral, resp. rate 18, height 6' (1.829 m), weight (!) 142.9 kg (315 lb), SpO2 97 %.   PHYSICAL EXAMINATION:  GENERAL:  57 y.o.-year-old patient lying in the bed with no acute distress.  EYES: Pupils equal, round, reactive to light and accommodation. No scleral icterus. Extraocular muscles intact.  HEENT: Head atraumatic, normocephalic. Oropharynx and nasopharynx clear.  NECK:  Supple, no jugular venous distention. No thyroid enlargement, no tenderness.  LUNGS: Normal breath sounds bilaterally, no wheezing, rales,rhonchi or crepitation. No use of accessory muscles of respiration.  CARDIOVASCULAR: S1, S2 normal. No murmurs, rubs, or gallops.  ABDOMEN: Soft, non-tender, non-distended. Bowel sounds present. No organomegaly or mass.  EXTREMITIES: Trace pedal edema.  Patient able to move left shoulder a little bit better today with less pain.   Still painful to palpation and movements. NEUROLOGIC: Cranial nerves II through XII are intact. Muscle strength 5/5 in all extremities. Sensation intact. Gait not checked.  PSYCHIATRIC: The patient is alert and oriented x 3.  SKIN: No obvious rash, lesion, or ulcer.   DATA REVIEW:   CBC Recent Labs  Lab 03/15/18 0045  WBC 8.1  HGB 12.2*  HCT 35.9*  PLT 252    Chemistries  Recent Labs  Lab 03/15/18 0045  NA 137  K 3.6  CL 103  CO2 25  GLUCOSE 150*  BUN 24*  CREATININE 1.25*  CALCIUM 8.7*    Cardiac Enzymes Recent Labs  Lab 03/15/18 0036  TROPONINI <0.03      RADIOLOGY:  Dg Cervical Spine 2 Or 3 Views  Result Date: 03/15/2018 CLINICAL DATA:  Neck pain radiating to LEFT shoulder, pain when turning head from side to side, neck stiffness, limited range of motion  LEFT shoulder EXAM: CERVICAL SPINE - 2-3 VIEW COMPARISON:  None FINDINGS: Osseous mineralization normal. Prevertebral soft tissues normal thickness. Disc space narrowing with endplate spur formation at C6-C7. Vertebral body and disc space heights otherwise maintained. No fracture, subluxation or bone destruction. C1-C2 alignment normal. 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 No acute abnormalities. Electronically Signed   By: Lavonia Dana M.D.   On: 03/15/2018 08:29   Dg Shoulder Left  Result Date: 03/15/2018 CLINICAL DATA:  Neck pain radiating to LEFT shoulder, pain when turning head from side to side, neck stiffness, limited range of motion LEFT shoulder EXAM: LEFT SHOULDER - 2+ VIEW COMPARISON:  None FINDINGS: Osseous mineralization normal. AC joint alignment normal. No acute fracture, dislocation or bone destruction. Visualized ribs unremarkable. IMPRESSION: Normal exam. Electronically Signed   By: Lavonia Dana M.D.   On: 03/15/2018 08:27   Ct Angio Chest Aorta W And/or Wo Contrast  Result Date: 03/14/2018 CLINICAL DATA:  Left shoulder, chest and back pain. History of hypertension, hyperlipidemia,  diabetes and stroke. Evaluate for thoracic aortic dissection. EXAM: CT ANGIOGRAPHY CHEST WITH CONTRAST TECHNIQUE: Multidetector CT imaging of the chest was performed using the standard protocol during bolus administration of intravenous contrast. Multiplanar CT image reconstructions and MIPs were obtained to evaluate the vascular anatomy. CONTRAST:  75mL OMNIPAQUE IOHEXOL 350 MG/ML SOLN COMPARISON:  Chest radiograph-earlier same day; chest CT-12/25/2010 FINDINGS: Vascular Findings: Normal caliber of the thoracic aorta with measurements as follows. Review of the precontrast images are negative for the presence of an intramural hematoma. No evidence of thoracic aortic dissection or periaortic stranding on this nongated examination. There is a very minimal amount of calcified atherosclerotic plaque within the distal aspect of the aortic arch (image 20, series 3). Conventional configuration of the aortic arch. The branch vessels of the aortic arch appear patent throughout their imaged course. Normal heart size. Small amount of fluid is seen within the pericardial recess. No pericardial effusion. Although this examination was not tailored for the evaluation the pulmonary arteries, there are no discrete filling defects within the central pulmonary arterial tree to suggest central pulmonary embolism. Normal caliber the main pulmonary artery. ------------------------------------------------------------- Thoracic aortic measurements: Sinotubular junction 29 mm as measured in greatest oblique coronal dimension. Proximal ascending aorta 35 mm as measured in greatest oblique axial dimension at the level of the main pulmonary artery. Aortic arch aorta 20 mm as measured in greatest oblique sagittal dimension. Proximal descending thoracic aorta 26 mm as measured in greatest oblique axial dimension at the level of the main pulmonary artery. Distal descending thoracic aorta 26 mm as measured in greatest oblique axial dimension at  the level of the diaphragmatic hiatus. Review of the MIP images confirms the above findings. ------------------------------------------------------------- Nonvascular Findings: Mediastinum/Lymph Nodes: No bulky mediastinal, hilar axillary lymphadenopathy. Lungs/Pleura: Minimal dependent subpleural ground-glass atelectasis. No discrete focal airspace opacities. No pleural effusion or pneumothorax. The central pulmonary airways appear widely patent. No discrete pulmonary nodules. Upper abdomen: Limited early arterial phase evaluation the upper abdomen demonstrates diffuse decreased attenuation of the hepatic parenchyma suggestive of hepatic steatosis. Musculoskeletal: No acute or aggressive osseous abnormalities. Stigmata of DISH within the thoracic spine. Note is made of mild asymmetric right-sided gynecomastia (image 53, series 6), similar to remote chest CT performed in 2012. Normal appearance of the thyroid gland. IMPRESSION: 1. No acute cardiopulmonary disease. Specifically, no evidence of thoracic aortic aneurysm or dissection. No evidence of central pulmonary embolism. 2. Very minimal amount of atherosclerotic plaque. Aortic Atherosclerosis (ICD10-I70.0). Electronically  Signed   By: Sandi Mariscal M.D.   On: 03/14/2018 13:13      Management plans discussed with the patient, and he is in agreement.  CODE STATUS:     Code Status Orders  (From admission, onward)        Start     Ordered   03/14/18 1829  Full code  Continuous     03/14/18 1828    Code Status History    Date Active Date Inactive Code Status Order ID Comments User Context   01/15/2016 2347 01/16/2016 2027 Full Code [id number]  Lance Coon, MD ED      TOTAL TIME TAKING CARE OF THIS PATIENT: 35 minutes.    Loletha Grayer M.D on 03/16/2018 at 12:38 PM  Between 7am to 6pm - Pager - [phone]  After 6pm go to [email or web address] - password [email or web address]  [facility]  [phone]  CC: Primary care physician; [name], MD

## 2018-03-16 NOTE — Care Management (Signed)
Patient without payer source. Patient typically fills medications via the medication management clinic but seeing as they are not open today RNCM will provide coupons for newly ordered medications. Goodrx coupon for Meloxicam given for $4. Oxycodone is full price at [facility] [employer]. Patient agreed to pay this amount. No further RNCM needs.

## 2018-03-16 NOTE — Care Management (Signed)
Orders in place for outpatient OT and DME BSC. Patient without insurance and prefers not to use services for [facility]. Per the patient he is mobile independently but his toilet seat is low but he thinks he may have a BSC available from family. Patient not interested in seeking charity DME from [facility]. Referral for outpatient OT faxed in. Family to provide transport home.

## 2018-03-16 NOTE — Evaluation (Addendum)
Occupational Therapy Evaluation Patient Details Name: Jamie Finley MRN: [id number] DOB: Jun 10, 1961 Today's Date: 03/16/2018    History of Present Illness Pt presents to hospital with complaints of L sided chest pain. Cardiac work up negative for cardiac etiology. Pt diagnosed with L adhesive capsulitis and received a cortisone shot injection by Dr. Posey Pronto on 03/15/18. Pt has a past medical history that includes OA, depression, DM, headaches, Gout, HTN, and stroke (01/15/16)   Clinical Impression   Met pt sitting up in chair, agreeable to OT this date. Per ortho physician note, pt with LUE adhesive capsulitis. Pain is limiting engagement in functional BADL/IADL at this time. HEP administered and reviewed with pt to include: neck stretches, passive shoulder flexion and IR/ER using pt cane, IR/ER using towel behind back, and shoulder rolls. Educated pt on importance of keeping these exercises passive and to not increase baseline pain. Pt engaged in HEP with 4.5-5/10 pain that did not increase with exercises demonstrated. Also educated pt on the importance of ice and rest in between exercises for continued pain management, pt in agreeance. Pt currently mod I in BADL using a SPC and increased time to compensate for LUE ROM and pain factors. Pt reports having 2 BMs since admin and able to complete peri care using LUE with increased time. Encourage pt to continue use of LUE (within baseline pain) to prevent increased stiffness. Pt states he would like a comfort height toilet at home, educated pt on using BSC with attachment to place over toilet to increase functional independence. Pt states he is willing to acquire upon d/c. Pt in understanding of HEP, would benefit from outpatient OT for continued management and progression of HEP for LUE adhesive capsulitis. Pt reports currently without insurance, which will affect OT discharge.    Follow Up Recommendations  Outpatient OT;Other (comment)(pending insurance  coverage, pt states he is currently w/o insurance)    Equipment Recommendations  3 in 1 bedside commode(to use over toilet to achieve comfort height)    Recommendations for Other Services       Precautions / Restrictions Precautions Precautions: None Restrictions Weight Bearing Restrictions: No      Mobility Bed Mobility Overal bed mobility: Independent                Transfers Overall transfer level: Modified independent Equipment used: Straight cane                  Balance Overall balance assessment: No apparent balance deficits (not formally assessed)   Sitting balance-Leahy Scale: Good Sitting balance - Comments: Sits EOB unsupported without LOB     Standing balance-Leahy Scale: Fair Standing balance comment: pt requires R UE support on SPC in standing. Unsteady on feet during pre gait exercises. No LOB during amb                           ADL either performed or assessed with clinical judgement   ADL Overall ADL's : Needs assistance/impaired Eating/Feeding: Independent   Grooming: Independent   Upper Body Bathing: Modified independent;Sitting Upper Body Bathing Details (indicate cue type and reason): increased time 2/2 LUE adhesive capsulitis limitations Lower Body Bathing: Independent;Sit to/from stand   Upper Body Dressing : Modified independent Upper Body Dressing Details (indicate cue type and reason): increased time 2/2 LUE adhesive capsulitis limitations Lower Body Dressing: Independent;Sit to/from stand   Toilet Transfer: Modified Independent;Supervision/safety;Comfort height toilet Toilet Transfer Details (indicate cue type and reason): pt  stating he is attempting to acquire comfort height toilet for the home Toileting- Clothing Manipulation and Hygiene: Modified independent Toileting - Clothing Manipulation Details (indicate cue type and reason): increased time, normally uses LUE for peri care. States he has had 2 BMs since  admin and was able to use LUE with increased time. Encouraged continuation of this Tub/ Shower Transfer: Modified independent;Shower seat   Functional mobility during ADLs: Modified independent General ADL Comments: overall pt modified independent with BADL, ultimately needing increased time to complete 2/2 LUE limitations from frozen shoulder     Vision Baseline Vision/History: Wears glasses Wears Glasses: Reading only Patient Visual Report: No change from baseline       Perception     Praxis      Pertinent Vitals/Pain Pain Assessment: 0-10 Pain Score: 4  Pain Location: L shoulder and surrounding parascapular musculature Pain Descriptors / Indicators: Tightness;Aching Pain Intervention(s): Limited activity within patient's tolerance;Monitored during session;Other (comment)(offered ice, pt deferred stating he will request if pain worsens)     Hand Dominance Right   Extremity/Trunk Assessment Upper Extremity Assessment Upper Extremity Assessment: Overall WFL for tasks assessed;LUE deficits/detail LUE Deficits / Details: 90 degrees active shoulder flexion before increase in pain (adhesive capsulitis)   Lower Extremity Assessment Lower Extremity Assessment: Overall WFL for tasks assessed       Communication Communication Communication: No difficulties   Cognition Arousal/Alertness: Awake/alert Behavior During Therapy: WFL for tasks assessed/performed Overall Cognitive Status: Within Functional Limits for tasks assessed                                     General Comments       Exercises Other Exercises Other Exercises: Adhesive capsulitis HEP administered and practiced (see clinical impression and OT goal of HEP for detail)   Shoulder Instructions      Home Living Family/patient expects to be discharged to:: Private residence Living Arrangements: Alone Available Help at Discharge: Family;Other (Comment);Neighbor(brother, neighbor ) Type of Home:  Other(Comment)(trailer) Home Access: Ramped entrance     Home Layout: One level     Bathroom Shower/Tub: Occupational psychologist: Standard     Home Equipment: Shower seat - built in;Cane - single point          Prior Functioning/Environment Level of Independence: Independent with assistive device(s)        Comments: 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, independent with ADL/IADL using SPC from previous back surgery/stroke        OT Problem List: Pain;Impaired UE functional use      OT Treatment/Interventions: Self-care/ADL training;Therapeutic exercise;Other (comment)(adhesive capsulitis HEP)    OT Goals(Current goals can be found in the care plan section) Acute Rehab OT Goals Patient Stated Goal: to return to functional activities with independence OT Goal Formulation: With patient Time For Goal Achievement: 03/30/18 Potential to Achieve Goals: Good  OT Frequency: Min 2X/week   Barriers to D/C:            Co-evaluation              AM-PAC PT "6 Clicks" Daily Activity     Outcome Measure Help from another person eating meals?: None Help from another person taking care of personal grooming?: None Help from another person toileting, which includes using toliet, bedpan, or urinal?: None Help from another person bathing (including washing, rinsing, drying)?:  None Help from another person to put on and taking off regular upper body clothing?: None Help from another person to put on and taking off regular lower body clothing?: None 6 Click Score: 24   End of Session    Activity Tolerance: Patient tolerated treatment well Patient left: in bed;with call bell/phone within reach  OT Visit Diagnosis: Pain;Other (comment)(UE impairments) Pain - Right/Left: Left Pain - part of body: Shoulder                Time: 3016-0109 OT Time Calculation (min): 40 min Charges:  OT General Charges $OT Visit:  1 Visit OT Evaluation $OT Eval Low Complexity: 1 Low OT Treatments $Therapeutic Exercise: 8-22 mins G-Codes:     Zenovia Jarred, [place], OTR/L [phone]  Zenovia Jarred 03/16/2018, 11:25 AM

## 2018-03-16 NOTE — Evaluation (Signed)
Physical Therapy Evaluation Patient Details Name: Jamie Finley MRN: [id number] DOB: December 29, 1960 Today's Date: 03/16/2018   History of Present Illness  Pt presents to hospital with complaints of L sided chest pain. Cardiac work up negative for cardiac etiology. Pt diagnosed with L adhesive capsulitis and received a cortisone shot injection by Dr. Posey Pronto on 03/15/18. Pt has a past medical history that includes OA, depression, DM, headaches, Gout, HTN, and stroke (01/15/16)    Clinical Impression  Pt is a pleasant 57 year old male who was admitted for chest pain subsequently diagnosed as L adhesive capsulitis. Pt performs bed mobility, transfers, and ambulation with supervision. Pt demonstrates deficits with ROM, strength, balance, and endurance. Pt demonstrates WNL strength bilaterally in LEs. Decreased ROM of L shld noted however full strength within available range. Pt performed pre gait marching exercises and appeared unsteady on feet. Pt amb 200' using SPC and supervision without LOB. Pt states that he is fatigued towards end of gait however that this is baseline for amb similar distance. Pt states that he has a prior history of falls and could benefit from continued therapy for L shoulder as well as to increase balance and prevent future falls. PT will continue to work with pt 2x/week while admitted. D/c recommendations at this time are outpatient PT. This entire session was guided, instructed, and directly supervised by Greggory Stallion, DPT.       Follow Up Recommendations Outpatient PT(for L shoulder and balance)    Equipment Recommendations  None recommended by PT    Recommendations for Other Services       Precautions / Restrictions Precautions Precautions: None Restrictions Weight Bearing Restrictions: No      Mobility  Bed Mobility Overal bed mobility: Independent                Transfers Overall transfer level: Modified independent Equipment used: Straight cane                Ambulation/Gait Ambulation/Gait assistance: Supervision Gait Distance (Feet): 200 Feet Assistive device: Straight cane Gait Pattern/deviations: WFL(Within Functional Limits)     General Gait Details: pt demonstrates WNL gait using SPC requiring close supervision. Pt instructed in pre gait marching exercises and appears unsteady on feet during. No LOB during gait. Complains of lack of sensation in feet due to neuropathy  Stairs            Wheelchair Mobility    Modified Rankin (Stroke Patients Only)       Balance Overall balance assessment: Needs assistance   Sitting balance-Leahy Scale: Good Sitting balance - Comments: Sits EOB unsupported without LOB     Standing balance-Leahy Scale: Fair Standing balance comment: pt requires R UE support on SPC in standing. Unsteady on feet during pre gait exercises. No LOB during amb                             Pertinent Vitals/Pain Pain Assessment: No/denies pain(tightness in L shld)    Home Living Family/patient expects to be discharged to:: Private residence Living Arrangements: Alone Available Help at Discharge: Family(brother and sister in law could "probably help some") Type of Home: Other(Comment)(trailer) Home Access: Ramped entrance     Home Layout: One level Home Equipment: Cane - single point      Prior Function Level of Independence: Independent with assistive device(s)         Comments: pt independent in community with SPC. 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 Pt states that he is prone to mechanical falls experiencing 2 this year     Hand Dominance        Extremity/Trunk Assessment   Upper Extremity Assessment Upper Extremity Assessment: Overall WFL for tasks assessed;LUE deficits/detail LUE Deficits / Details: decreased ROM in L shld see Ortho MDs note for ROM deficits. Good strength in available range    Lower Extremity Assessment Lower Extremity Assessment: Overall WFL  for tasks assessed       Communication   Communication: No difficulties  Cognition Arousal/Alertness: Awake/alert Behavior During Therapy: WFL for tasks assessed/performed Overall Cognitive Status: Within Functional Limits for tasks assessed                                        General Comments      Exercises     Assessment/Plan    PT Assessment Patient needs continued PT services  PT Problem List Decreased strength;Decreased range of motion;Decreased activity tolerance;Decreased balance;Decreased mobility;Decreased coordination;Decreased knowledge of use of DME;Decreased safety awareness;Pain       PT Treatment Interventions DME instruction;Gait training;Stair training;Functional mobility training;Therapeutic activities;Therapeutic exercise;Balance training;Neuromuscular re-education;Patient/family education    PT Goals (Current goals can be found in the Care Plan section)  Acute Rehab PT Goals Patient Stated Goal: get back to PLOF PT Goal Formulation: With patient Time For Goal Achievement: 03/30/18 Potential to Achieve Goals: Good    Frequency Min 2X/week   Barriers to discharge        Co-evaluation               AM-PAC PT "6 Clicks" Daily Activity  Outcome Measure Difficulty turning over in bed (including adjusting bedclothes, sheets and blankets)?: None Difficulty moving from lying on back to sitting on the side of the bed? : None Difficulty sitting down on and standing up from a chair with arms (e.g., wheelchair, bedside commode, etc,.)?: None Help needed moving to and from a bed to chair (including a wheelchair)?: None Help needed walking in hospital room?: None Help needed climbing 3-5 steps with a railing? : None 6 Click Score: 24    End of Session Equipment Utilized During Treatment: Gait belt Activity Tolerance: Patient tolerated treatment well Patient left: in chair;with call bell/phone within reach Nurse Communication:  Mobility status;Other (comment)(no chair alarm, nursing states this is okay) PT Visit Diagnosis: Unsteadiness on feet (R26.81);Other abnormalities of gait and mobility (R26.89);Repeated falls (R29.6);Muscle weakness (generalized) (M62.81);History of falling (Z91.81);Difficulty in walking, not elsewhere classified (R26.2);Pain Pain - Right/Left: Left Pain - part of body: Shoulder(with movement)    Time: 2778-2423 PT Time Calculation (min) (ACUTE ONLY): 15 min   Charges:         PT G Codes:        Euclid Cassetta, SPT   Anjelo Pullman 03/16/2018, 9:45 AM

## 2018-03-16 NOTE — Progress Notes (Signed)
Patient discharged via wheelchair and private vehicle. IV removed and catheter intact. All discharge instructions given and patient verbalizes understanding. Tele removed and returned. No prescriptions given to patient No distress noted.   

## 2018-03-19 ENCOUNTER — Ambulatory Visit (INDEPENDENT_AMBULATORY_CARE_PROVIDER_SITE_OTHER): Payer: Self-pay | Admitting: Family Medicine

## 2018-03-19 ENCOUNTER — Ambulatory Visit: Payer: Self-pay | Admitting: Family Medicine

## 2018-03-19 VITALS — BP 104/64 | HR 77 | Temp 98.5°F | Ht 72.0 in | Wt 320.5 lb

## 2018-03-19 DIAGNOSIS — Z7409 Other reduced mobility: Secondary | ICD-10-CM

## 2018-03-19 DIAGNOSIS — E1143 Type 2 diabetes mellitus with diabetic autonomic (poly)neuropathy: Secondary | ICD-10-CM

## 2018-03-19 DIAGNOSIS — I10 Essential (primary) hypertension: Secondary | ICD-10-CM

## 2018-03-19 DIAGNOSIS — E785 Hyperlipidemia, unspecified: Secondary | ICD-10-CM

## 2018-03-19 DIAGNOSIS — R2689 Other abnormalities of gait and mobility: Secondary | ICD-10-CM

## 2018-03-19 DIAGNOSIS — E1169 Type 2 diabetes mellitus with other specified complication: Secondary | ICD-10-CM

## 2018-03-19 DIAGNOSIS — Z8673 Personal history of transient ischemic attack (TIA), and cerebral infarction without residual deficits: Secondary | ICD-10-CM

## 2018-03-19 DIAGNOSIS — E118 Type 2 diabetes mellitus with unspecified complications: Secondary | ICD-10-CM

## 2018-03-19 DIAGNOSIS — E1165 Type 2 diabetes mellitus with hyperglycemia: Secondary | ICD-10-CM

## 2018-03-19 DIAGNOSIS — IMO0002 Reserved for concepts with insufficient information to code with codable children: Secondary | ICD-10-CM

## 2018-03-19 DIAGNOSIS — M6281 Muscle weakness (generalized): Secondary | ICD-10-CM

## 2018-03-19 LAB — POCT GLYCOSYLATED HEMOGLOBIN (HGB A1C): Hemoglobin A1C: 7.4 % — AB (ref 4.0–5.6)

## 2018-03-19 MED ORDER — METFORMIN HCL 1000 MG PO TABS: 1000.0000 mg | ORAL_TABLET | Freq: Two times a day (BID) | ORAL | 3 refills | Status: DC

## 2018-03-19 MED ORDER — CLOPIDOGREL BISULFATE 75 MG PO TABS: 75.0000 mg | ORAL_TABLET | Freq: Every day | ORAL | 3 refills | Status: DC

## 2018-03-19 MED ORDER — GLIPIZIDE 10 MG PO TABS: 10.0000 mg | ORAL_TABLET | Freq: Two times a day (BID) | ORAL | 3 refills | Status: DC

## 2018-03-19 MED ORDER — ROSUVASTATIN CALCIUM 20 MG PO TABS: 20.0000 mg | ORAL_TABLET | Freq: Every day | ORAL | 3 refills | Status: DC

## 2018-03-19 MED ORDER — HYDROCHLOROTHIAZIDE 12.5 MG PO CAPS: ORAL_CAPSULE | ORAL | 3 refills | Status: DC

## 2018-03-19 MED ORDER — LISINOPRIL 10 MG PO TABS: 10.0000 mg | ORAL_TABLET | Freq: Every day | ORAL | 3 refills | Status: DC

## 2018-03-19 NOTE — Progress Notes (Signed)
Subjective:    Patient ID: Jamie Finley, male    DOB: 22-Aug-1961, 57 y.o.   MRN: [id number]  HPI 57 year old male presents for hospital follow up.  Admitted 03/14/2018 for chest pain Dx with adhesive capsulitis of left shoulder  1.  Adhesive capsulitis of the left shoulder with neck pain.  The patient was given systemic Solu-Medrol.  The patient was seen in consultation by Dr. Posey Pronto orthopedic surgery and he injected the shoulder with steroid.  Patient was seen in consultation by physical therapy and Occupational Therapy.  Outpatient occupational therapy was recommended.  I did prescribe meloxicam at night.  Some as needed pain medication. 2.  Essential hypertension on hydrochlorothiazide and lisinopril 3.  Type 2 diabetes mellitus on glipizide and metformin. 4.  Hyperlipidemia unspecified on Crestor 5.  History of stroke on aspirin and Plavix 6.  Chest pain.  Likely related to shoulder and neck pain.  Stress test canceled since cardiac enzymes were negative x3.  03/19/18  Today he reports he has started home exercise 2 times daily. He has had significant improvement in pain with the steroid injections in his shoulder.  Cervical spine X-ray showed: Degenerative disc disease changes at C6-C7.  He has not filled rx for meloxicam or oxycodone.  He feels like he has noted  Muscle weakness all over ongoing x  10 years, worse in last 2-3 years.  Decreased endurance. Now has to use a cane.. Cannot stand up from squatting, cannot stand on one leg without falling. Has been ongoing x several years.. Progressively worsening He has always bee an active person, but has not been able to do as much. Unable to work.  No longer can lift things as previously.  Drops things all the time.  Feels like feet drag some when  tired.  Occ with numbness in left shoulder when sleeping, occ numbness in bilateral fingers.  Aunt with MS.  DM Lab Results  Component Value Date   HGBA1C 9.2 (H) 12/17/2017   Now improved with lifestyle changes and glipizide low dose Occ missing second glipizide tab Lab Results  Component Value Date   HGBA1C 7.4 (A) 03/19/2018   FBS  120-180, occ > 200  Has been trying to eat better.  Labs in 2018: cbc, TSH, vit D 03/2018 cbc nml  Nml Vit D  Blood pressure 104/64, pulse 77, temperature 98.5 F (36.9 C), temperature source Oral, height 6' (1.829 m), weight (!) 320 lb 8 oz (145.4 kg). Social History /Family History/Past Medical History reviewed in detail and updated in EMR if needed.   Review of Systems  Constitutional: Negative for fatigue and fever.  HENT: Negative for ear pain.   Eyes: Negative for pain.  Respiratory: Negative for cough and shortness of breath.   Cardiovascular: Negative for chest pain, palpitations and leg swelling.  Gastrointestinal: Negative for abdominal pain.  Genitourinary: Negative for dysuria.  Musculoskeletal: Negative for arthralgias.  Neurological: Negative for syncope, light-headedness and headaches.  Psychiatric/Behavioral: Negative for dysphoric mood.       Objective:   Physical Exam  Constitutional: He is oriented to person, place, and time. Vital signs are normal. He appears well-developed and well-nourished.  HENT:  Head: Normocephalic.  Right Ear: Hearing normal.  Left Ear: Hearing normal.  Nose: Nose normal.  Mouth/Throat: Oropharynx is clear and moist and mucous membranes are normal.  Neck: Trachea normal. Carotid bruit is not present. No thyroid mass and no thyromegaly present.  Cardiovascular: Normal rate, regular rhythm and  normal pulses. Exam reveals no gallop, no distant heart sounds and no friction rub.  No murmur heard. No peripheral edema  Pulmonary/Chest: Effort normal and breath sounds normal. No respiratory distress.  Musculoskeletal:       Right shoulder: Normal.       Left shoulder: He exhibits decreased range of motion. He exhibits no tenderness, no bony tenderness and no deformity.        Thoracic back: Normal.       Lumbar back: Normal.  Neurological: He is oriented to person, place, and time. He has normal strength and normal reflexes. He is not disoriented. He displays no atrophy and no tremor. No cranial nerve deficit or sensory deficit. He exhibits normal muscle tone.  Skin: Skin is warm, dry and intact. No rash noted.  Psychiatric: He has a normal mood and affect. His speech is normal and behavior is normal. Thought content normal.   Diabetic foot exam: Normal inspection No skin breakdown No calluses  Normal DP pulses Normal sensation to light touch and monofilament Nails normal        Assessment & Plan:

## 2018-03-19 NOTE — Patient Instructions (Addendum)
Can use meloxicam  As needed for shoulder or neck pain.  Continue home PT.  If pain is worsening.. Call for referral to PT.  Increase glucotrol to 10 mg twice daily. Please stop at the front desk to set up referral.

## 2018-03-28 ENCOUNTER — Telehealth: Payer: Self-pay | Admitting: Pharmacy Technician

## 2018-03-28 NOTE — Telephone Encounter (Signed)
Received updated proof of income.  Patient eligible to receive medication assistance at [facility] through 2019, as long as eligibility requirements continue to be met.  [facility]

## 2018-04-22 ENCOUNTER — Other Ambulatory Visit: Payer: Self-pay

## 2018-04-22 ENCOUNTER — Emergency Department
Admission: EM | Admit: 2018-04-22 | Discharge: 2018-04-22 | Disposition: A | Discharge status: Home / Self Care | Payer: Self-pay | Attending: Emergency Medicine | Admitting: Emergency Medicine

## 2018-04-22 ENCOUNTER — Emergency Department: Payer: Self-pay

## 2018-04-22 ENCOUNTER — Encounter: Payer: Self-pay | Admitting: *Deleted

## 2018-04-22 DIAGNOSIS — M25512 Pain in left shoulder: Secondary | ICD-10-CM

## 2018-04-22 DIAGNOSIS — I10 Essential (primary) hypertension: Secondary | ICD-10-CM | POA: Insufficient documentation

## 2018-04-22 DIAGNOSIS — E119 Type 2 diabetes mellitus without complications: Secondary | ICD-10-CM | POA: Insufficient documentation

## 2018-04-22 DIAGNOSIS — Z7982 Long term (current) use of aspirin: Secondary | ICD-10-CM | POA: Insufficient documentation

## 2018-04-22 DIAGNOSIS — Z7901 Long term (current) use of anticoagulants: Secondary | ICD-10-CM | POA: Insufficient documentation

## 2018-04-22 DIAGNOSIS — Z8673 Personal history of transient ischemic attack (TIA), and cerebral infarction without residual deficits: Secondary | ICD-10-CM | POA: Insufficient documentation

## 2018-04-22 DIAGNOSIS — Z87891 Personal history of nicotine dependence: Secondary | ICD-10-CM | POA: Insufficient documentation

## 2018-04-22 DIAGNOSIS — Z7984 Long term (current) use of oral hypoglycemic drugs: Secondary | ICD-10-CM | POA: Insufficient documentation

## 2018-04-22 DIAGNOSIS — M7502 Adhesive capsulitis of left shoulder: Secondary | ICD-10-CM | POA: Insufficient documentation

## 2018-04-22 LAB — BASIC METABOLIC PANEL
Anion gap: 8 (ref 5–15)
BUN: 15 mg/dL (ref 6–20)
CALCIUM: 9.2 mg/dL (ref 8.9–10.3)
CO2: 25 mmol/L (ref 22–32)
CREATININE: 0.99 mg/dL (ref 0.61–1.24)
Chloride: 106 mmol/L (ref 98–111)
GFR calc Af Amer: >60 mL/min (ref >60)
GFR calc non Af Amer: >60 mL/min (ref >60)
GLUCOSE: 140 mg/dL — AB (ref 70–99)
Potassium: 3.7 mmol/L (ref 3.5–5.1)
SODIUM: 139 mmol/L (ref 135–145)

## 2018-04-22 LAB — CBC
HEMATOCRIT: 38.8 % — AB (ref 40.0–52.0)
Hemoglobin: 13.1 g/dL (ref 13.0–18.0)
MCH: 29 pg (ref 26.0–34.0)
MCHC: 33.8 g/dL (ref 32.0–36.0)
MCV: 85.9 fL (ref 80.0–100.0)
PLATELETS: 277 10*3/uL (ref 150–440)
RBC: 4.51 MIL/uL (ref 4.40–5.90)
RDW: 15.4 % — AB (ref 11.5–14.5)
WBC: 12.1 10*3/uL — AB (ref 3.8–10.6)

## 2018-04-22 LAB — TROPONIN I: Troponin I: <0.03 ng/mL (ref <0.03)

## 2018-04-22 MED ORDER — PREDNISONE 10 MG PO TABS: 20.0000 mg | ORAL_TABLET | Freq: Every day | ORAL | 0 refills | Status: DC

## 2018-04-22 MED ORDER — KETOROLAC TROMETHAMINE 30 MG/ML IJ SOLN
60.0000 mg | Freq: Once | INTRAMUSCULAR | Status: AC
  Administered 2018-04-22: 60 mg via INTRAMUSCULAR
  Filled 2018-04-22: qty 2

## 2018-04-22 MED ORDER — PREDNISONE 20 MG PO TABS
20.0000 mg | ORAL_TABLET | Freq: Once | ORAL | Status: AC
Start: 2018-04-22 — End: 2018-04-22
  Administered 2018-04-22: 20 mg via ORAL
  Filled 2018-04-22: qty 1

## 2018-04-22 NOTE — ED Triage Notes (Signed)
PT to ED reporting the return of left shoulder pain that pt was treated for 1 month ago with a steroid injection. PT reports they dx him with "frozen shoulder" and reports the rotator cuff was swollen. The steroids decreased the pain but over the past two days pt has had a cough and pain has returned. PT also reporting SOB and has a noticeably increased WOB in triage. Pt denies CP and verbalized he does not believe his heart is the cause of his SOB.  Color is appropriate and pt is able to speak in complete sentences. No fevers.

## 2018-04-22 NOTE — ED Provider Notes (Signed)
[facility] Emergency Department Provider Note   ____________________________________________   First MD Initiated Contact with Patient 04/22/18 1650     (approximate)  I have reviewed the triage vital signs and the nursing notes.   HISTORY  Chief Complaint Left shoulder pain   HPI Jamie Finley is a 57 y.o. male reports that the pain in his left shoulder is coming back, is been worsening over the last week.  Reports it hurts so bad when he tries to take a deep breath it hurts his left shoulder and sometimes the pain is so great he feels like he cannot take a deep breath.  Reports he had the same pain before and was admitted to the hospital about a month ago for the same and had to have injections into the shoulder that helped for a while but the pain is returned.  Reports his fairly comfortable in sitting still, but if he gets up to walk to move her uses the left arm he has severe pain just in the left shoulder.  It is sharp, much worse with movement.  No numbness tingling or weakness in the arm.  No injury.  Past Medical History:  Diagnosis Date  . Arthritis   . Depression   . Diabetes mellitus without complication ([place])   . Frequent headaches   . Gout   . Hyperlipidemia   . Hypertension   . Stroke [facility])     Patient Active Problem List   Diagnosis Date Noted  . Acute gout of right elbow 09/25/2017  . Bilateral knee pain 08/24/2017  . Seasonal depression ([place]) 08/24/2017  . Chronic low back pain 02/27/2017  . Acute pain of left hip 11/22/2016  . History of CVA (cerebrovascular accident) 01/15/2016  . Hyperlipidemia associated with type 2 diabetes mellitus ([place]) 02/18/2015  . Peripheral autonomic neuropathy due to diabetes mellitus ([place]) 02/18/2015  . Diabetes mellitus type 2, uncontrolled, with complications ([place]) 95/28/4132  . Hypertension 12/15/2014  . Morbid obesity ([place]) 12/15/2014    Past Surgical History:  Procedure Laterality Date   . BACK SURGERY  2007   [facility]  . TONSILLECTOMY      Prior to Admission medications   Medication Sig Start Date End Date Taking? Authorizing Provider  aspirin 81 MG tablet Take 81 mg by mouth daily.   Yes Roselee Nova, MD  clopidogrel (PLAVIX) 75 MG tablet Take 1 tablet (75 mg total) by mouth daily. 03/19/18  Yes Bedsole, Amy E, MD  glipiZIDE (GLUCOTROL) 10 MG tablet Take 1 tablet (10 mg total) by mouth 2 (two) times daily before a meal. 03/19/18  Yes Bedsole, Amy E, MD  hydrochlorothiazide (MICROZIDE) 12.5 MG capsule TAKE ONE TABLET BY MOUTH EVERY DAY 03/19/18  Yes Bedsole, Amy E, MD  lisinopril (PRINIVIL,ZESTRIL) 10 MG tablet Take 1 tablet (10 mg total) by mouth daily. 03/19/18  Yes Bedsole, Amy E, MD  meloxicam (MOBIC) 7.5 MG tablet Take 1 tablet (7.5 mg total) by mouth at bedtime. 03/16/18  Yes Loletha Grayer, MD  metFORMIN (GLUCOPHAGE) 1000 MG tablet Take 1 tablet (1,000 mg total) by mouth 2 (two) times daily with a meal. 03/19/18  Yes Bedsole, Amy E, MD  rosuvastatin (CRESTOR) 20 MG tablet Take 1 tablet (20 mg total) by mouth at bedtime. 03/19/18  Yes Bedsole, Amy E, MD  oxyCODONE (OXY IR/ROXICODONE) 5 MG immediate release tablet Take 1 tablet (5 mg total) by mouth every 6 (six) hours as needed for moderate pain or severe pain.  Patient not taking: Reported on 04/22/2018 03/16/18   Loletha Grayer, MD  predniSONE (DELTASONE) 10 MG tablet Take 2 tablets (20 mg total) by mouth daily. 04/22/18   Delman Kitten, MD    Allergies Patient has no known allergies.  Family History  Problem Relation Age of Onset  . Cancer Mother        face and jaw  . Cancer Father        kidney  . Early death Father   . Diabetes Maternal Grandmother   . Depression Maternal Grandfather   . Diabetes Maternal Grandfather   . Hearing loss Maternal Grandfather     Social History Social History   Tobacco Use  . Smoking status: Former Smoker    Last attempt to quit: 09/11/1989    Years since quitting: 28.6  .  Smokeless tobacco: Never Used  Substance Use Topics  . Alcohol use: Yes  . Drug use: No    Review of Systems Constitutional: No fever/chills Eyes: No visual changes. ENT: No sore throat. Cardiovascular: Denies chest pain. Respiratory: Denies shortness of breath except when he gets up to walk he starts breathing heavy it really makes the pain in the shoulder feel worse.  No shortness of breath at rest.  Denies any pain in the chest. Gastrointestinal: No abdominal pain.  No nausea, no vomiting.  No diarrhea.  No constipation. Genitourinary: Negative for dysuria. Musculoskeletal: Negative for back pain. Skin: Negative for rash. Neurological: Negative for headaches, focal weakness or numbness.    ____________________________________________   PHYSICAL EXAM:  VITAL SIGNS: ED Triage Vitals  Enc Vitals Group     BP 04/22/18 1633 118/75     Pulse Rate 04/22/18 1633 77     Resp 04/22/18 1633 20     Temp --      Temp src --      SpO2 04/22/18 1633 100 %     Weight 04/22/18 1447 (!) 320 lb (145.2 kg)     Height 04/22/18 1447 6' (1.829 m)     Head Circumference --      Peak Flow --      Pain Score 04/22/18 1447 8     Pain Loc --      Pain Edu? --      Excl. in [place]? --     Constitutional: Alert and oriented. Well appearing and in no acute distress. Eyes: Conjunctivae are normal. Head: Atraumatic. Nose: No congestion/rhinnorhea. Mouth/Throat: Mucous membranes are moist. Neck: No stridor.   Cardiovascular: Normal rate, regular rhythm. Grossly normal heart sounds.  Good peripheral circulation. Respiratory: Normal respiratory effort.  No retractions. Lungs CTAB. Gastrointestinal: Soft and nontender. No distention. Musculoskeletal:   RIGHT Right upper extremity demonstrates normal strength, good use of all muscles. No edema bruising or contusions of the right shoulder/upper arm, right elbow, right forearm / hand. Full range of motion of the right right upper extremity without  pain. No evidence of trauma. Strong radial pulse. Intact median/ulnar/radial neuro-muscular exam.  LEFT Left upper extremity demonstrates limited strength at the shoulder due to pain, good use of all muscles. No edema bruising or contusions of the left shoulder/upper arm, left elbow, left forearm / hand. Full range of motion of the left  upper extremity could not be completed due to pain throughout the left shoulder joint.. No evidence of trauma. Strong radial pulse. Intact median/ulnar/radial neuro-muscular exam.  No redness or warmth overlying the left shoulder.   Neurologic:  Normal speech and language. No gross  focal neurologic deficits are appreciated.  Skin:  Skin is warm, dry and intact. No rash noted. Psychiatric: Mood and affect are normal. Speech and behavior are normal.  ____________________________________________   LABS (all labs ordered are listed, but only abnormal results are displayed)  Labs Reviewed  BASIC METABOLIC PANEL - Abnormal; Notable for the following components:      Result Value   Glucose, Bld 140 (*)    All other components within normal limits  CBC - Abnormal; Notable for the following components:   WBC 12.1 (*)    HCT 38.8 (*)    RDW 15.4 (*)    All other components within normal limits  TROPONIN I   ____________________________________________  EKG  Reviewed and are by me at 1450 Heart rate 90 QRS 99 QTc 440 Normal sinus rhythm, no evidence of acute ischemia ____________________________________________  RADIOLOGY  Dg Chest 2 View  Result Date: 04/22/2018 CLINICAL DATA:  Shortness of breath, cough EXAM: CHEST - 2 VIEW COMPARISON:  03/14/2018 FINDINGS: No pleural effusion or focal consolidation. Normal heart size. No pneumothorax. Degenerative osteophytes of the spine. IMPRESSION: No active cardiopulmonary disease. Electronically Signed   By: Donavan Foil M.D.   On: 04/22/2018 15:19     ____________________________________________   PROCEDURES  Procedure(s) performed: None  Procedures  Critical Care performed: No  ____________________________________________   INITIAL IMPRESSION / ASSESSMENT AND PLAN / ED COURSE  Pertinent labs & imaging results that were available during my care of the patient were reviewed by me and considered in my medical decision making (see chart for details).  Left shoulder pain.  In review of his records and clinical history he does have focal left shoulder discomfort and I suspect this is causing him to feel somewhat short of breath at times because of the amount of pain he is experiencing with movement.  Is quite comfortable at rest, but certainly has focal tenderness and pain along the left shoulder.  No signs of infection.  Discussed with the patient and he reports notable trouble as far as cost of healthcare and insurance for follow-up with orthopedics.  We had registration speak to him and offer financial counseling resources.  Patient is requesting a steroid injection the left shoulder, I cannot provide it to him right now we did discuss Toradol and low-dose steroid burst to see if this would be helpful.  Normally would use a higher dose of prednisone, but the patient reports that when he does take high doses its cause blood sugar problems so we will offer a slightly lower dose of prednisone for 5 days.  No evidence of acute neurovascular compromise.  No evidence of cardiac disease by lab or EKG.  Denies any pleuritic type chest pain but rather all of his pain is in his left shoulder which is known to have adhesive capsulitis.  No evidence of lower extremity edema, no history of DVT.  Does not appear to be consistent with pulmonary embolism.  Normal oxygen saturation normal heart rate, no chest pain.  Return precautions and treatment recommendations and follow-up discussed with the patient who is agreeable with the plan.        ____________________________________________   FINAL CLINICAL IMPRESSION(S) / ED DIAGNOSES  Final diagnoses:  Adhesive capsulitis of left shoulder  Acute pain of left shoulder      NEW MEDICATIONS STARTED DURING THIS VISIT:  Discharge Medication List as of 04/22/2018  6:20 PM    START taking these medications   Details  predniSONE (DELTASONE)  10 MG tablet Take 2 tablets (20 mg total) by mouth daily., Starting Mon 04/22/2018, Normal         Note:  This document was prepared using Dragon voice recognition software and may include unintentional dictation errors.     Delman Kitten, MD 04/22/18 [phone]

## 2018-04-22 NOTE — Discharge Instructions (Signed)
Follow-up closely with Orthopedics, call for an appointment tomorrow.  Return to the Emergency Department (ED) if you experience any further chest pain/pressure/tightness, difficulty breathing, or sudden sweating, or other symptoms that concern you.

## 2018-05-20 NOTE — Assessment & Plan Note (Signed)
?   Is weakness, mobility issues due to peripheral neuropathy ... Pt report of symptoms seems more significant and progressive. Refer to neuro for further eval.

## 2018-05-20 NOTE — Assessment & Plan Note (Signed)
Well controlled. Continue current medication.  

## 2018-05-20 NOTE — Assessment & Plan Note (Signed)
Continue crestor 

## 2018-05-20 NOTE — Assessment & Plan Note (Signed)
Encouraged exercise, weight loss, healthy eating habits. ? ?

## 2018-05-20 NOTE — Assessment & Plan Note (Signed)
Refer to neurology

## 2018-05-20 NOTE — Assessment & Plan Note (Signed)
Inadequate control. Increase glucotrol to 10 mg twice daily.

## 2018-06-19 ENCOUNTER — Ambulatory Visit: Payer: Self-pay | Admitting: Neurology

## 2018-06-20 ENCOUNTER — Other Ambulatory Visit (INDEPENDENT_AMBULATORY_CARE_PROVIDER_SITE_OTHER): Payer: Self-pay

## 2018-06-20 ENCOUNTER — Encounter: Payer: Self-pay | Admitting: Emergency Medicine

## 2018-06-20 DIAGNOSIS — Z808 Family history of malignant neoplasm of other organs or systems: Secondary | ICD-10-CM

## 2018-06-20 DIAGNOSIS — K5900 Constipation, unspecified: Secondary | ICD-10-CM | POA: Diagnosis not present

## 2018-06-20 DIAGNOSIS — Z6841 Body Mass Index (BMI) 40.0 and over, adult: Secondary | ICD-10-CM

## 2018-06-20 DIAGNOSIS — R338 Other retention of urine: Secondary | ICD-10-CM | POA: Diagnosis not present

## 2018-06-20 DIAGNOSIS — Z9181 History of falling: Secondary | ICD-10-CM

## 2018-06-20 DIAGNOSIS — G4700 Insomnia, unspecified: Secondary | ICD-10-CM | POA: Diagnosis present

## 2018-06-20 DIAGNOSIS — E785 Hyperlipidemia, unspecified: Secondary | ICD-10-CM

## 2018-06-20 DIAGNOSIS — Z791 Long term (current) use of non-steroidal anti-inflammatories (NSAID): Secondary | ICD-10-CM

## 2018-06-20 DIAGNOSIS — Z7984 Long term (current) use of oral hypoglycemic drugs: Secondary | ICD-10-CM

## 2018-06-20 DIAGNOSIS — Z7902 Long term (current) use of antithrombotics/antiplatelets: Secondary | ICD-10-CM

## 2018-06-20 DIAGNOSIS — Z833 Family history of diabetes mellitus: Secondary | ICD-10-CM

## 2018-06-20 DIAGNOSIS — Z125 Encounter for screening for malignant neoplasm of prostate: Secondary | ICD-10-CM

## 2018-06-20 DIAGNOSIS — E1169 Type 2 diabetes mellitus with other specified complication: Secondary | ICD-10-CM | POA: Diagnosis present

## 2018-06-20 DIAGNOSIS — Z822 Family history of deafness and hearing loss: Secondary | ICD-10-CM

## 2018-06-20 DIAGNOSIS — K567 Ileus, unspecified: Secondary | ICD-10-CM | POA: Diagnosis not present

## 2018-06-20 DIAGNOSIS — E1142 Type 2 diabetes mellitus with diabetic polyneuropathy: Secondary | ICD-10-CM | POA: Diagnosis present

## 2018-06-20 DIAGNOSIS — E119 Type 2 diabetes mellitus without complications: Secondary | ICD-10-CM

## 2018-06-20 DIAGNOSIS — Z79899 Other long term (current) drug therapy: Secondary | ICD-10-CM

## 2018-06-20 DIAGNOSIS — Z9089 Acquired absence of other organs: Secondary | ICD-10-CM

## 2018-06-20 DIAGNOSIS — Z7982 Long term (current) use of aspirin: Secondary | ICD-10-CM

## 2018-06-20 DIAGNOSIS — E1165 Type 2 diabetes mellitus with hyperglycemia: Secondary | ICD-10-CM | POA: Diagnosis present

## 2018-06-20 DIAGNOSIS — Z8051 Family history of malignant neoplasm of kidney: Secondary | ICD-10-CM

## 2018-06-20 DIAGNOSIS — Z8673 Personal history of transient ischemic attack (TIA), and cerebral infarction without residual deficits: Secondary | ICD-10-CM

## 2018-06-20 DIAGNOSIS — I10 Essential (primary) hypertension: Secondary | ICD-10-CM | POA: Diagnosis present

## 2018-06-20 DIAGNOSIS — M47816 Spondylosis without myelopathy or radiculopathy, lumbar region: Principal | ICD-10-CM | POA: Diagnosis present

## 2018-06-20 DIAGNOSIS — Z87891 Personal history of nicotine dependence: Secondary | ICD-10-CM

## 2018-06-20 LAB — CBC WITH DIFFERENTIAL/PLATELET
BASOS PCT: 0.6 % (ref 0.0–3.0)
Basophils Absolute: 0 10*3/uL (ref 0.0–0.1)
Eosinophils Absolute: 0.1 10*3/uL (ref 0.0–0.7)
Eosinophils Relative: 1.8 % (ref 0.0–5.0)
HCT: 39.6 % (ref 39.0–52.0)
Hemoglobin: 13.3 g/dL (ref 13.0–17.0)
LYMPHS ABS: 2.8 10*3/uL (ref 0.7–4.0)
Lymphocytes Relative: 37 % (ref 12.0–46.0)
MCHC: 33.6 g/dL (ref 30.0–36.0)
MCV: 86 fl (ref 78.0–100.0)
MONO ABS: 0.7 10*3/uL (ref 0.1–1.0)
Monocytes Relative: 9.5 % (ref 3.0–12.0)
NEUTROS ABS: 3.8 10*3/uL (ref 1.4–7.7)
NEUTROS PCT: 51.1 % (ref 43.0–77.0)
PLATELETS: 257 10*3/uL (ref 150.0–400.0)
RBC: 4.61 Mil/uL (ref 4.22–5.81)
RDW: 15 % (ref 11.5–15.5)
WBC: 7.5 10*3/uL (ref 4.0–10.5)

## 2018-06-20 LAB — BASIC METABOLIC PANEL
BUN: 15 mg/dL (ref 6–23)
CALCIUM: 9.5 mg/dL (ref 8.4–10.5)
CO2: 30 mEq/L (ref 19–32)
Chloride: 101 mEq/L (ref 96–112)
Creatinine, Ser: 1.1 mg/dL (ref 0.40–1.50)
GFR: 73.17 mL/min (ref >60.00)
GLUCOSE: 96 mg/dL (ref 70–99)
Potassium: 3.9 mEq/L (ref 3.5–5.1)
SODIUM: 140 meq/L (ref 135–145)

## 2018-06-20 LAB — LDL CHOLESTEROL, DIRECT: Direct LDL: 151 mg/dL

## 2018-06-20 LAB — HEPATIC FUNCTION PANEL
ALBUMIN: 4.1 g/dL (ref 3.5–5.2)
ALK PHOS: 56 U/L (ref 39–117)
ALT: 18 U/L (ref 0–53)
AST: 18 U/L (ref 0–37)
BILIRUBIN DIRECT: 0.1 mg/dL (ref 0.0–0.3)
BILIRUBIN TOTAL: 0.8 mg/dL (ref 0.2–1.2)
Total Protein: 6.8 g/dL (ref 6.0–8.3)

## 2018-06-20 LAB — LIPID PANEL
CHOL/HDL RATIO: 5
CHOLESTEROL: 219 mg/dL — AB (ref 0–200)
HDL: 40 mg/dL (ref >39.00)
NonHDL: 179.25
Triglycerides: 206 mg/dL — ABNORMAL HIGH (ref 0.0–149.0)
VLDL: 41.2 mg/dL — ABNORMAL HIGH (ref 0.0–40.0)

## 2018-06-20 LAB — PSA: PSA: 0.41 ng/mL (ref 0.10–4.00)

## 2018-06-20 LAB — HEMOGLOBIN A1C: Hgb A1c MFr Bld: 6.8 % — ABNORMAL HIGH (ref 4.6–6.5)

## 2018-06-20 MED ORDER — OXYCODONE-ACETAMINOPHEN 5-325 MG PO TABS
ORAL_TABLET | ORAL | Status: AC
  Filled 2018-06-20: qty 1

## 2018-06-20 MED ORDER — OXYCODONE-ACETAMINOPHEN 5-325 MG PO TABS
1.0000 | ORAL_TABLET | ORAL | Status: DC | PRN
  Administered 2018-06-20: 1 via ORAL

## 2018-06-20 NOTE — ED Triage Notes (Addendum)
Pt reports lower back pain that radiates into buttocks on both sides x3 weeks ago when he was lifting heavy objects. Pt not seen for medical care afterwards. Pt has been able to tolerate pain until today when pain radiated down into buttocks.

## 2018-06-21 ENCOUNTER — Inpatient Hospital Stay
Admission: EM | Admit: 2018-06-21 | Discharge: 2018-06-22 | DRG: 552 | Disposition: A | Discharge status: Other Acute Inpatient Hospital | Payer: Self-pay | Attending: Internal Medicine | Admitting: Internal Medicine

## 2018-06-21 ENCOUNTER — Encounter: Payer: Self-pay | Admitting: Internal Medicine

## 2018-06-21 ENCOUNTER — Emergency Department: Payer: Self-pay

## 2018-06-21 ENCOUNTER — Other Ambulatory Visit: Payer: Self-pay

## 2018-06-21 ENCOUNTER — Observation Stay: Payer: Self-pay

## 2018-06-21 DIAGNOSIS — K567 Ileus, unspecified: Secondary | ICD-10-CM | POA: Diagnosis present

## 2018-06-21 DIAGNOSIS — R52 Pain, unspecified: Secondary | ICD-10-CM

## 2018-06-21 DIAGNOSIS — M545 Low back pain, unspecified: Secondary | ICD-10-CM

## 2018-06-21 DIAGNOSIS — M549 Dorsalgia, unspecified: Secondary | ICD-10-CM

## 2018-06-21 LAB — GLUCOSE, CAPILLARY
GLUCOSE-CAPILLARY: 134 mg/dL — AB (ref 70–99)
Glucose-Capillary: 104 mg/dL — ABNORMAL HIGH (ref 70–99)
Glucose-Capillary: 80 mg/dL (ref 70–99)
Glucose-Capillary: 101 mg/dL — ABNORMAL HIGH (ref 70–99)

## 2018-06-21 LAB — COMPREHENSIVE METABOLIC PANEL
ALBUMIN: 4.5 g/dL (ref 3.5–5.0)
ALT: 28 U/L (ref 0–44)
AST: 25 U/L (ref 15–41)
Alkaline Phosphatase: 57 U/L (ref 38–126)
Anion gap: 9 (ref 5–15)
BILIRUBIN TOTAL: 1 mg/dL (ref 0.3–1.2)
BUN: 17 mg/dL (ref 6–20)
CO2: 25 mmol/L (ref 22–32)
CREATININE: 1.21 mg/dL (ref 0.61–1.24)
Calcium: 9.5 mg/dL (ref 8.9–10.3)
Chloride: 105 mmol/L (ref 98–111)
GFR calc Af Amer: >60 mL/min (ref >60)
GLUCOSE: 161 mg/dL — AB (ref 70–99)
POTASSIUM: 3.9 mmol/L (ref 3.5–5.1)
Sodium: 139 mmol/L (ref 135–145)
TOTAL PROTEIN: 7.9 g/dL (ref 6.5–8.1)

## 2018-06-21 LAB — CBC WITH DIFFERENTIAL/PLATELET
ABS IMMATURE GRANULOCYTES: 0.02 10*3/uL (ref 0.00–0.07)
BASOS ABS: 0.1 10*3/uL (ref 0.0–0.1)
BASOS PCT: 1 %
Eosinophils Absolute: 0.1 10*3/uL (ref 0.0–0.5)
Eosinophils Relative: 1 %
HCT: 41.9 % (ref 39.0–52.0)
HEMOGLOBIN: 14.2 g/dL (ref 13.0–17.0)
Immature Granulocytes: 0 %
Lymphocytes Relative: 34 %
Lymphs Abs: 3.2 10*3/uL (ref 0.7–4.0)
MCH: 29 pg (ref 26.0–34.0)
MCHC: 33.9 g/dL (ref 30.0–36.0)
MCV: 85.7 fL (ref 80.0–100.0)
MONO ABS: 0.8 10*3/uL (ref 0.1–1.0)
MONOS PCT: 8 %
NEUTROS ABS: 5.1 10*3/uL (ref 1.7–7.7)
Neutrophils Relative %: 56 %
Platelets: 289 10*3/uL (ref 150–400)
RBC: 4.89 MIL/uL (ref 4.22–5.81)
RDW: 13.7 % (ref 11.5–15.5)
WBC: 9.2 10*3/uL (ref 4.0–10.5)
nRBC: 0 % (ref 0.0–0.2)

## 2018-06-21 MED ORDER — LIDOCAINE 5 % EX PTCH
1.0000 | MEDICATED_PATCH | CUTANEOUS | Status: DC
  Administered 2018-06-21 – 2018-06-22 (×2): 1 via TRANSDERMAL
  Filled 2018-06-21 (×4): qty 1

## 2018-06-21 MED ORDER — KETOROLAC TROMETHAMINE 30 MG/ML IJ SOLN
30.0000 mg | Freq: Once | INTRAMUSCULAR | Status: AC
  Administered 2018-06-21: 30 mg via INTRAMUSCULAR
  Filled 2018-06-21: qty 1

## 2018-06-21 MED ORDER — FENTANYL 2500MCG IN NS 250ML (10MCG/ML) PREMIX INFUSION
100.0000 ug/h | INTRAVENOUS | Status: DC
  Administered 2018-06-21: 100 ug/h via INTRAVENOUS
  Filled 2018-06-21: qty 250

## 2018-06-21 MED ORDER — HYDROMORPHONE HCL 1 MG/ML IJ SOLN
2.0000 mg | Freq: Once | INTRAMUSCULAR | Status: AC
  Administered 2018-06-21: 2 mg via SUBCUTANEOUS
  Filled 2018-06-21: qty 2

## 2018-06-21 MED ORDER — INSULIN ASPART 100 UNIT/ML ~~LOC~~ SOLN
0.0000 [IU] | Freq: Three times a day (TID) | SUBCUTANEOUS | Status: DC
  Administered 2018-06-21: 2 [IU] via SUBCUTANEOUS
  Filled 2018-06-21: qty 1

## 2018-06-21 MED ORDER — DIAZEPAM 5 MG PO TABS
5.0000 mg | ORAL_TABLET | Freq: Once | ORAL | Status: AC
  Administered 2018-06-21: 5 mg via ORAL
  Filled 2018-06-21: qty 1

## 2018-06-21 MED ORDER — ROSUVASTATIN CALCIUM 10 MG PO TABS
20.0000 mg | ORAL_TABLET | Freq: Every day | ORAL | Status: DC
  Administered 2018-06-21: 20 mg via ORAL
  Filled 2018-06-21: qty 2

## 2018-06-21 MED ORDER — SENNOSIDES-DOCUSATE SODIUM 8.6-50 MG PO TABS: 1.0000 | ORAL_TABLET | Freq: Every evening | ORAL | Status: DC | PRN

## 2018-06-21 MED ORDER — ASPIRIN EC 81 MG PO TBEC
81.0000 mg | DELAYED_RELEASE_TABLET | Freq: Every day | ORAL | Status: DC
  Administered 2018-06-21 – 2018-06-22 (×2): 81 mg via ORAL
  Filled 2018-06-21 (×2): qty 1

## 2018-06-21 MED ORDER — ACETAMINOPHEN 325 MG PO TABS: 650.0000 mg | ORAL_TABLET | Freq: Four times a day (QID) | ORAL | Status: DC | PRN

## 2018-06-21 MED ORDER — HYDROMORPHONE HCL 1 MG/ML IJ SOLN
1.0000 mg | Freq: Once | INTRAMUSCULAR | Status: AC
  Administered 2018-06-21: 1 mg via INTRAVENOUS
  Filled 2018-06-21: qty 1

## 2018-06-21 MED ORDER — INSULIN ASPART 100 UNIT/ML ~~LOC~~ SOLN: 0.0000 [IU] | Freq: Every day | SUBCUTANEOUS | Status: DC

## 2018-06-21 MED ORDER — ENOXAPARIN SODIUM 40 MG/0.4ML ~~LOC~~ SOLN
40.0000 mg | Freq: Two times a day (BID) | SUBCUTANEOUS | Status: DC
  Administered 2018-06-21 – 2018-06-22 (×3): 40 mg via SUBCUTANEOUS
  Filled 2018-06-21 (×3): qty 0.4

## 2018-06-21 MED ORDER — LISINOPRIL 10 MG PO TABS
10.0000 mg | ORAL_TABLET | Freq: Every day | ORAL | Status: DC
  Administered 2018-06-21 – 2018-06-22 (×2): 10 mg via ORAL
  Filled 2018-06-21 (×2): qty 1

## 2018-06-21 MED ORDER — LORAZEPAM 2 MG/ML IJ SOLN: 2.0000 mg | Freq: Once | INTRAMUSCULAR | Status: DC

## 2018-06-21 MED ORDER — CLOPIDOGREL BISULFATE 75 MG PO TABS
75.0000 mg | ORAL_TABLET | Freq: Every day | ORAL | Status: DC
  Administered 2018-06-21 – 2018-06-22 (×2): 75 mg via ORAL
  Filled 2018-06-21 (×2): qty 1

## 2018-06-21 MED ORDER — ALPRAZOLAM 0.25 MG PO TABS
0.2500 mg | ORAL_TABLET | Freq: Three times a day (TID) | ORAL | Status: DC | PRN
  Administered 2018-06-21: 0.25 mg via ORAL
  Filled 2018-06-21: qty 1

## 2018-06-21 MED ORDER — LORAZEPAM 2 MG/ML IJ SOLN
1.0000 mg | Freq: Once | INTRAMUSCULAR | Status: AC
  Administered 2018-06-21: 1 mg via INTRAVENOUS
  Filled 2018-06-21: qty 1

## 2018-06-21 MED ORDER — HYDROCHLOROTHIAZIDE 12.5 MG PO CAPS
12.5000 mg | ORAL_CAPSULE | Freq: Every day | ORAL | Status: DC
  Administered 2018-06-21 – 2018-06-22 (×2): 12.5 mg via ORAL
  Filled 2018-06-21 (×2): qty 1

## 2018-06-21 MED ORDER — HYDROMORPHONE HCL 1 MG/ML IJ SOLN
1.0000 mg | INTRAMUSCULAR | Status: DC | PRN
  Administered 2018-06-21: 1 mg via INTRAVENOUS
  Administered 2018-06-21 – 2018-06-22 (×2): 2 mg via INTRAVENOUS
  Administered 2018-06-22: 1 mg via INTRAVENOUS
  Filled 2018-06-21: qty 1
  Filled 2018-06-21 (×3): qty 2

## 2018-06-21 MED ORDER — CYCLOBENZAPRINE HCL 10 MG PO TABS
10.0000 mg | ORAL_TABLET | Freq: Three times a day (TID) | ORAL | Status: DC | PRN
  Administered 2018-06-21 – 2018-06-22 (×2): 10 mg via ORAL
  Filled 2018-06-21 (×2): qty 1

## 2018-06-21 MED ORDER — ONDANSETRON HCL 4 MG/2ML IJ SOLN: 4.0000 mg | Freq: Four times a day (QID) | INTRAMUSCULAR | Status: DC | PRN

## 2018-06-21 MED ORDER — DIPHENHYDRAMINE HCL 25 MG PO CAPS
25.0000 mg | ORAL_CAPSULE | Freq: Three times a day (TID) | ORAL | Status: DC | PRN
  Administered 2018-06-21: 25 mg via ORAL
  Filled 2018-06-21: qty 1

## 2018-06-21 MED ORDER — ONDANSETRON HCL 4 MG PO TABS: 4.0000 mg | ORAL_TABLET | Freq: Four times a day (QID) | ORAL | Status: DC | PRN

## 2018-06-21 MED ORDER — ACETAMINOPHEN 650 MG RE SUPP: 650.0000 mg | Freq: Four times a day (QID) | RECTAL | Status: DC | PRN

## 2018-06-21 MED ORDER — KETOROLAC TROMETHAMINE 30 MG/ML IJ SOLN
30.0000 mg | Freq: Three times a day (TID) | INTRAMUSCULAR | Status: AC | PRN
  Administered 2018-06-21 (×2): 30 mg via INTRAVENOUS
  Filled 2018-06-21 (×2): qty 1

## 2018-06-21 MED ORDER — BISACODYL 5 MG PO TBEC
5.0000 mg | DELAYED_RELEASE_TABLET | Freq: Every day | ORAL | Status: DC | PRN
  Administered 2018-06-22: 5 mg via ORAL
  Filled 2018-06-21: qty 1

## 2018-06-21 MED ORDER — HYDROMORPHONE HCL 1 MG/ML IJ SOLN
2.0000 mg | Freq: Once | INTRAMUSCULAR | Status: AC
  Administered 2018-06-21: 2 mg via INTRAVENOUS
  Filled 2018-06-21: qty 2

## 2018-06-21 MED ORDER — LACTATED RINGERS IV SOLN
INTRAVENOUS | Status: AC
  Administered 2018-06-21 (×2): via INTRAVENOUS

## 2018-06-21 NOTE — Progress Notes (Signed)
Patient complaining of difficulty breathing that seems to be localized in his throat area. He is also very anxious. VSS. O2 sats 100% on room air. MD notified orders received.

## 2018-06-21 NOTE — Evaluation (Signed)
Physical Therapy Evaluation Patient Details Name: Jamie Finley MRN: [id number] DOB: Aug 13, 1961 Today's Date: 06/21/2018   History of Present Illness  Pt is a 57 y.o. male with a known history of morbid obesity, T2NIDDM, HTN, HLD, DJD/OA/CLBP presented with acute on chronic low back pain. Pt endorses Hx "lower back problems", stated he had L-spine surgery in past, possible discectomy, denied fusion/hardware.  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 Pt stated he typically only drives and performs light work (2/2 Hx of back issues), however that day he stated he helped lay out tent pieces, and stated he, "might have overdid it." He stated his low back was sore and achy the next day, but states the discomfort was minimal, and seemed to improve. He stated that evening the pain returned, but the character of the pain was different. He described a pulling/tension-type pain in the B/L hips and B/L low back (R > L). The pain was initially mild, but had progressively gotten worse. Pt stated he was sitting on a stool printing tee shirts, and then tried to get up, and experienced sudden R low back + B/L hip pain, worsened w/ movement and positional change. He has been unable to sleep due to pain. Stated he is afraid to fall, and has fallen at home (but denies significant injury).  Assessment includes: intractable low back pain with possible spinal stenosis/disc herniation and hyperglycemia.     Clinical Impression  Pt presents with deficits in strength, transfers, mobility, gait, balance, and activity tolerance.  Pt presented with mild deficits in light touch and strength to the LLE compared to the RLE with pt favoring trunk flexed position in the bed as well as in standing.  Pt able to come to near upright position in standing but only for several seconds before returning to trunk-flexed position secondary to increased pain with extension.  Pt was able to perform sup  to/from sit without physical assistance but required extensive time and effort to do so utilizing the log roll technique.  Pt was able to amb 10 feet with a RW and CGA near the EOB with reports of feeling as if his LLE was buckling due to weakness but pt was able to self-correct without assistance.  Pt has very limited assistance at home with discharge to his prior living situation unsafe at this time due to significant deficits in functional mobility.  Pt will benefit from PT services in a SNF setting upon discharge to safely address above deficits for decreased caregiver assistance and eventual return to PLOF.       Follow Up Recommendations SNF;Supervision for mobility/OOB    Equipment Recommendations  Rolling walker with 5" wheels;Other (comment)(Bariatric RW (TBD at next venue of care if pt discharges to a SNF))    Recommendations for Other Services       Precautions / Restrictions Precautions Precautions: Fall Restrictions Weight Bearing Restrictions: No      Mobility  Bed Mobility Overal bed mobility: Modified Independent             General bed mobility comments: Significant time and effort and use of bed rail required with the [facility] elevated   Transfers Overall transfer level: Needs assistance Equipment used: Rolling walker (2 wheeled) Transfers: Sit to/from Stand Sit to Stand: Min guard         General transfer comment: Extra time and effort required to stand but no physical assistance needed  Ambulation/Gait Ambulation/Gait assistance: Min guard  Gait Distance (Feet): 10 Feet Assistive device: Rolling walker (2 wheeled) Gait Pattern/deviations: Step-through pattern;Decreased step length - right;Decreased step length - left;Trunk flexed;Antalgic Gait velocity: Decreased   General Gait Details: Heavy use of the BUEs on the RW with flexed trunk posture with slow cadence and short B step length  Stairs            Wheelchair Mobility    Modified Rankin  (Stroke Patients Only)       Balance Overall balance assessment: Mild deficits observed, not formally tested                                           Pertinent Vitals/Pain Pain Assessment: 0-10 Pain Score: 7  Pain Location: Low back, bilateral hip area Pain Descriptors / Indicators: Aching;Sore;Sharp Pain Intervention(s): Limited activity within patient's tolerance;Monitored during session;Premedicated before session    Home Living Family/patient expects to be discharged to:: Private residence Living Arrangements: Alone Available Help at Discharge: Family;Friend(s);Available PRN/intermittently Type of Home: Mobile home Home Access: Ramped entrance     Home Layout: One level Home Equipment: Shower seat - built in;Cane - single point;Walker - standard      Prior Function Level of Independence: Independent with assistive device(s)         Comments: Ind amb without an AD with occasional use of a SPC as needed, mostly "when gout comes on", Ind with ADLs, 3 falls in the last year due to "tripping"     Hand Dominance   Dominant Hand: Right    Extremity/Trunk Assessment   Upper Extremity Assessment Upper Extremity Assessment: Overall WFL for tasks assessed    Lower Extremity Assessment Lower Extremity Assessment: Generalized weakness;RLE deficits/detail;LLE deficits/detail LLE Deficits / Details: Decreased LLE strength grossly compared to the RLE with pt describing feeling as if the LLE is buckling but able to self-correct when in standing LLE Sensation: decreased light touch(Sensation to light touch intact but decreased compared to the RLE) RLE Deficits / Details: WFL RLE Sensation: WNL       Communication   Communication: No difficulties  Cognition Arousal/Alertness: Awake/alert Behavior During Therapy: WFL for tasks assessed/performed Overall Cognitive Status: Within Functional Limits for tasks assessed                                         General Comments      Exercises Total Joint Exercises Ankle Circles/Pumps: AROM;Both;10 reps Quad Sets: Strengthening;Both;10 reps Gluteal Sets: Strengthening;Both;10 reps Heel Slides: AROM;Both;5 reps Long Arc Quad: AROM;Both;10 reps Knee Flexion: AROM;Both;10 reps Marching in Standing: AROM;Both;10 reps;Standing Other Exercises Other Exercises: Log roll technique training/review Other Exercises: HEP education for BLE APs, QS, GS, and LAQ x 10 each 5-6x/day as tolerated   Assessment/Plan    PT Assessment Patient needs continued PT services  PT Problem List Decreased strength;Decreased activity tolerance;Decreased balance;Decreased mobility;Pain       PT Treatment Interventions DME instruction;Gait training;Functional mobility training;Balance training;Therapeutic exercise;Therapeutic activities;Patient/family education    PT Goals (Current goals can be found in the Care Plan section)  Acute Rehab PT Goals Patient Stated Goal: To move and walk without pain PT Goal Formulation: With patient Time For Goal Achievement: 07/04/18 Potential to Achieve Goals: Good    Frequency Min 2X/week   Barriers to discharge Inaccessible home  environment;Decreased caregiver support      Co-evaluation               AM-PAC PT "6 Clicks" Daily Activity  Outcome Measure Difficulty turning over in bed (including adjusting bedclothes, sheets and blankets)?: A Lot Difficulty moving from lying on back to sitting on the side of the bed? : A Lot Difficulty sitting down on and standing up from a chair with arms (e.g., wheelchair, bedside commode, etc,.)?: Unable Help needed moving to and from a bed to chair (including a wheelchair)?: A Little Help needed walking in hospital room?: A Lot Help needed climbing 3-5 steps with a railing? : Total 6 Click Score: 11    End of Session Equipment Utilized During Treatment: Gait belt Activity Tolerance: Patient limited by  pain Patient left: in bed;with call bell/phone within reach;with bed alarm set Nurse Communication: Mobility status PT Visit Diagnosis: Muscle weakness (generalized) (M62.81);Difficulty in walking, not elsewhere classified (R26.2);Pain Pain - Right/Left: Right(bilateral) Pain - part of body: Hip(low back and bilateral hips)    Time: 9407-6808 PT Time Calculation (min) (ACUTE ONLY): 31 min   Charges:   PT Evaluation $PT Eval Low Complexity: 1 Low PT Treatments $Therapeutic Exercise: 8-22 mins        D. Scott Zianna Dercole PT, DPT 06/21/18, 10:35 AM

## 2018-06-21 NOTE — ED Notes (Signed)
Pt unable to tolerate DG as well as MRI due to pain in lower back. Admitting doctor at bedside.

## 2018-06-21 NOTE — Progress Notes (Signed)
Called to pts room for difficulty breathing. Pt BBS clear. Pt c/o reflux and sore throat. Pt also c/o anxiety.  Pt O2 sats 98% Hr 93. Pt placed on 2lpm nasal cannula. MD notified. Will continue to monitor pt.

## 2018-06-21 NOTE — H&P (Signed)
[facility] [place] NAME: Jamie Finley    MR#:  [id number]  DATE OF BIRTH:  11/26/1960  DATE OF ADMISSION:  06/21/2018  PRIMARY CARE PHYSICIAN: [name], MD   REQUESTING/REFERRING PHYSICIAN: Darel Hong, MD  CHIEF COMPLAINT:   Chief Complaint  Patient presents with  . Back Pain    HISTORY OF PRESENT ILLNESS:  Jamie Finley  is a 57 y.o. male with a known history of morbid obesity, T2NIDDM, HTN, HLD, DJD/OA/CLBP p/w acute on chronic low back pain. Pt endorses Hx "lower back problems", states he had L-spine surgery in past, possible discectomy, denies fusion/hardware. Denies retention/incontience, groin/saddle anesthesia at present. 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 Pt state he typically only drives and performs light work (2/2 Hx of back issues), however that day he states he helped lay out tent pieces, and states he, "might have overdid it." He states his low back was sore and achy the next day, but states the discomfort was minimal, and seemed to improve. He states, however, that Saturday (06/15/2018) evening, the pain returned, but he states the character of the pain was different. He describes a pulling/tension-type pain in the B/L hips and B/L low back (R > L). The pain was initially mild, but has progressively gotten worse, and has been significant in the past 24-48hr. He states he was sitting on a stool printing tee shirts, and then tried to get up, and experienced sudden R low back + B/L hip pain, worsened w/ movement and positional change. He has been unable to sleep due to pain. States he is afraid to fall, and has fallen at home (but denies significant injury). Pt to ED 2/2 worsening + persistent/intractable pain, overall severity impairing function.  PAST MEDICAL HISTORY:   Past Medical History:  Diagnosis Date  . Arthritis   . Depression   . Diabetes mellitus  without complication ([place])   . Frequent headaches   . Gout   . Hyperlipidemia   . Hypertension   . Stroke [facility])     PAST SURGICAL HISTORY:   Past Surgical History:  Procedure Laterality Date  . BACK SURGERY  2007   [facility]  . TONSILLECTOMY      SOCIAL HISTORY:   Social History   Tobacco Use  . Smoking status: Former Smoker    Last attempt to quit: 09/11/1989    Years since quitting: 28.7  . Smokeless tobacco: Never Used  Substance Use Topics  . Alcohol use: Yes    FAMILY HISTORY:   Family History  Problem Relation Age of Onset  . Cancer Mother        face and jaw  . Cancer Father        kidney  . Early death Father   . Diabetes Maternal Grandmother   . Depression Maternal Grandfather   . Diabetes Maternal Grandfather   . Hearing loss Maternal Grandfather     DRUG ALLERGIES:  No Known Allergies  REVIEW OF SYSTEMS:   Review of Systems  Constitutional: Negative for chills, diaphoresis, fever, malaise/fatigue and weight loss.  HENT: Negative for congestion, ear pain, hearing loss, nosebleeds, sinus pain, sore throat and tinnitus.   Eyes: Negative for blurred vision, double vision and photophobia.  Respiratory: Negative for cough, hemoptysis, sputum production, shortness of breath and wheezing.   Cardiovascular: Negative for chest pain, palpitations, orthopnea, claudication, leg swelling and PND.  Gastrointestinal: Negative  for abdominal pain, blood in stool, constipation, diarrhea, heartburn, melena, nausea and vomiting.  Genitourinary: Negative for dysuria, frequency, hematuria and urgency.  Musculoskeletal: Positive for back pain, falls and joint pain. Negative for myalgias and neck pain.  Skin: Negative for itching and rash.  Neurological: Positive for weakness. Negative for dizziness, tingling, tremors, sensory change, speech change, focal weakness, seizures, loss of consciousness and headaches.  Psychiatric/Behavioral: Negative for memory loss. The patient has  insomnia.    MEDICATIONS AT HOME:   Prior to Admission medications   Medication Sig Start Date End Date Taking? Authorizing Provider  aspirin 81 MG tablet Take 81 mg by mouth daily.   Yes Roselee Nova, MD  clopidogrel (PLAVIX) 75 MG tablet Take 1 tablet (75 mg total) by mouth daily. 03/19/18  Yes Bedsole, Amy E, MD  glipiZIDE (GLUCOTROL) 10 MG tablet Take 1 tablet (10 mg total) by mouth 2 (two) times daily before a meal. 03/19/18  Yes Bedsole, Amy E, MD  hydrochlorothiazide (MICROZIDE) 12.5 MG capsule TAKE ONE TABLET BY MOUTH EVERY DAY 03/19/18  Yes Bedsole, Amy E, MD  lisinopril (PRINIVIL,ZESTRIL) 10 MG tablet Take 1 tablet (10 mg total) by mouth daily. 03/19/18  Yes Bedsole, Amy E, MD  metFORMIN (GLUCOPHAGE) 1000 MG tablet Take 1 tablet (1,000 mg total) by mouth 2 (two) times daily with a meal. 03/19/18  Yes Bedsole, Amy E, MD  rosuvastatin (CRESTOR) 20 MG tablet Take 1 tablet (20 mg total) by mouth at bedtime. 03/19/18  Yes Bedsole, Amy E, MD  meloxicam (MOBIC) 7.5 MG tablet Take 1 tablet (7.5 mg total) by mouth at bedtime. Patient not taking: Reported on 06/21/2018 03/16/18   Loletha Grayer, MD  oxyCODONE (OXY IR/ROXICODONE) 5 MG immediate release tablet Take 1 tablet (5 mg total) by mouth every 6 (six) hours as needed for moderate pain or severe pain. Patient not taking: Reported on 04/22/2018 03/16/18   Loletha Grayer, MD  predniSONE (DELTASONE) 10 MG tablet Take 2 tablets (20 mg total) by mouth daily. Patient not taking: Reported on 06/21/2018 04/22/18   Delman Kitten, MD      VITAL SIGNS:  Blood pressure (!) 147/70, pulse 84, temperature 97.6 F (36.4 C), temperature source Oral, resp. rate 16, weight (!) 147.4 kg, SpO2 95 %.  PHYSICAL EXAMINATION:  Physical Exam  Constitutional: He is oriented to person, place, and time. He appears well-developed and well-nourished. He is active and cooperative.  Non-toxic appearance. He does not have a sickly appearance. He does not appear ill. He appears  distressed (Moderate distress 2/2 pain.). He is not intubated.  HENT:  Head: Normocephalic and atraumatic.  Mouth/Throat: Oropharynx is clear and moist. No oropharyngeal exudate.  Eyes: Conjunctivae, EOM and lids are normal. No scleral icterus.  Neck: Neck supple. No JVD present. No thyromegaly present.  Cardiovascular: Normal rate, regular rhythm, S1 normal, S2 normal and normal heart sounds.  No extrasystoles are present. Exam reveals no gallop, no S3, no S4, no distant heart sounds and no friction rub.  No murmur heard. Pulmonary/Chest: Effort normal. No accessory muscle usage or stridor. No apnea, no tachypnea and no bradypnea. He is not intubated. No respiratory distress. He has no decreased breath sounds. He has no wheezes. He has no rhonchi. He has no rales.  Abdominal: Soft. Bowel sounds are normal. He exhibits no distension. There is no tenderness. There is no rigidity, no rebound and no guarding.  Musculoskeletal: Normal range of motion. He exhibits no edema or tenderness.  Lymphadenopathy:  He has no cervical adenopathy.  Neurological: He is alert and oriented to person, place, and time. He is not disoriented.  Skin: Skin is warm, dry and intact. He is not diaphoretic. No erythema.  Psychiatric: He has a normal mood and affect. His speech is normal and behavior is normal. Judgment and thought content normal. Cognition and memory are normal.   LABORATORY PANEL:   CBC Recent Labs  Lab 06/21/18 0246  WBC 9.2  HGB 14.2  HCT 41.9  PLT 289   ------------------------------------------------------------------------------------------------------------------  Chemistries  Recent Labs  Lab 06/21/18 0246  NA 139  K 3.9  CL 105  CO2 25  GLUCOSE 161*  BUN 17  CREATININE 1.21  CALCIUM 9.5  AST 25  ALT 28  ALKPHOS 57  BILITOT 1.0   ------------------------------------------------------------------------------------------------------------------  Cardiac Enzymes No  results for input(s): TROPONINI in the last 168 hours. ------------------------------------------------------------------------------------------------------------------  RADIOLOGY:  No results found.  IMPRESSION AND PLAN:   A/P: 38M w/ DJD/OA/CLBP p/w acute on chronic intractable low back pain, suspect spinal stenosis. Hyperglycemia (w/ T2NIDDM). -Acute on chronic intractable low back pain: Pt w/ Hx DJD/OA/CLBP, endorses Hx L-spine surgery. Now w/ acute worsening. Low suspicion for cauda equina syndrome. Suspicion exists for spinal stenosis, disc herniation. MRI L-spine pending, results to determine next best step in management. For now, symptomatic mgmt, pain ctrl. Fall precautions. -Hyperglycemia, T2NIDDM: SSI, hold PO antihyperglycemics. -c/w other home meds/formulary subs. -FEN/GI: Cardiac diabetic diet. -DVT PPx: Lovenox. -Code status: Full code. -Disposition: Observation, < 2 midnights.   All the records are reviewed and case discussed with ED provider. Management plans discussed with the patient, family and they are in agreement.  CODE STATUS: Full code.  TOTAL TIME TAKING CARE OF THIS PATIENT: 60 minutes.    Arta Silence M.D on 06/21/2018 at 4:20 AM  Between 7am to 6pm - Pager - [phone]  After 6pm go to [email or web address] - Proofreader  [facility]  Office  [phone]  CC: Primary care physician; [name], MD   Note: This dictation was prepared with Dragon dictation along with smaller phrase technology. Any transcriptional errors that result from this process are unintentional.

## 2018-06-21 NOTE — NC FL2 (Signed)
[place] LEVEL OF CARE SCREENING TOOL     IDENTIFICATION  Patient Name: Jamie Finley Birthdate: 11-12-1960 Sex: male Admission Date (Current Location): 06/21/2018  [place] and [place] Number:  Engineering geologist and Address:  [facility], [address] [place], [place] [address]      Provider Number: [id number]  Attending Physician Name and Address:  Bettey Costa, MD  Relative Name and Phone Number:       Current Level of Care: Hospital Recommended Level of Care: [place] Prior Approval Number:    Date Approved/Denied:   PASRR Number: [id number])  Discharge Plan: SNF    Current Diagnoses: Patient Active Problem List   Diagnosis Date Noted  . Low back pain 06/21/2018  . Imbalance 05/20/2018  . Hyperlipidemia 05/20/2018  . Acute gout of right elbow 09/25/2017  . Bilateral knee pain 08/24/2017  . Seasonal depression ([place]) 08/24/2017  . Chronic low back pain 02/27/2017  . Acute pain of left hip 11/22/2016  . History of CVA (cerebrovascular accident) 01/15/2016  . Hyperlipidemia associated with type 2 diabetes mellitus ([place]) 02/18/2015  . Peripheral autonomic neuropathy due to diabetes mellitus ([place]) 02/18/2015  . Diabetes mellitus type 2, uncontrolled, with complications ([place]) 17/61/6073  . Hypertension 12/15/2014  . Morbid obesity ([place]) 12/15/2014    Orientation RESPIRATION BLADDER Height & Weight     Self, Time, Situation, Place  Normal Continent Weight: (!) 325 lb (147.4 kg) Height:     BEHAVIORAL SYMPTOMS/MOOD NEUROLOGICAL BOWEL NUTRITION STATUS      Continent Diet(Diet: Heart Healthy/ Carb Modified. )  AMBULATORY STATUS COMMUNICATION OF NEEDS Skin   Extensive Assist Verbally Normal                       Personal Care Assistance Level of Assistance  Bathing, Feeding, Dressing Bathing Assistance: Limited assistance Feeding assistance: Independent Dressing Assistance: Limited  assistance     Functional Limitations Info  Sight, Hearing, Speech Sight Info: Adequate Hearing Info: Adequate Speech Info: Adequate    SPECIAL CARE FACTORS FREQUENCY  PT (By licensed PT), OT (By licensed OT)     PT Frequency: (5) OT Frequency: (5)            Contractures      Additional Factors Info  Code Status, Allergies Code Status Info: (Full Code. ) Allergies Info: (No Known Allergies. )           Current Medications (06/21/2018):  This is the current hospital active medication list Current Facility-Administered Medications  Medication Dose Route Frequency Provider Last Rate Last Dose  . acetaminophen (TYLENOL) tablet 650 mg  650 mg Oral Q6H PRN Arta Silence, MD       Or  . acetaminophen (TYLENOL) suppository 650 mg  650 mg Rectal Q6H PRN Arta Silence, MD      . aspirin EC tablet 81 mg  81 mg Oral Daily Arta Silence, MD   81 mg at 06/21/18 1122  . bisacodyl (DULCOLAX) EC tablet 5 mg  5 mg Oral Daily PRN Arta Silence, MD      . clopidogrel (PLAVIX) tablet 75 mg  75 mg Oral Daily Arta Silence, MD   75 mg at 06/21/18 1123  . cyclobenzaprine (FLEXERIL) tablet 10 mg  10 mg Oral Q8H PRN Bettey Costa, MD   10 mg at 06/21/18 1322  . enoxaparin (LOVENOX) injection 40 mg  40 mg Subcutaneous Q12H Arta Silence, MD   40 mg at 06/21/18  1123  . hydrochlorothiazide (MICROZIDE) capsule 12.5 mg  12.5 mg Oral Daily Arta Silence, MD   12.5 mg at 06/21/18 1123  . HYDROmorphone (DILAUDID) injection 1-2 mg  1-2 mg Intravenous Q3H PRN Arta Silence, MD   2 mg at 06/21/18 1543  . insulin aspart (novoLOG) injection 0-15 Units  0-15 Units Subcutaneous TID WC Arta Silence, MD   2 Units at 06/21/18 0852  . insulin aspart (novoLOG) injection 0-5 Units  0-5 Units Subcutaneous QHS Sridharan, Prasanna, MD      . ketorolac (TORADOL) 30 MG/ML injection 30 mg  30 mg Intravenous Q8H PRN Arta Silence, MD   30 mg at 06/21/18 1121  .  lactated ringers infusion   Intravenous Continuous Arta Silence, MD 125 mL/hr at 06/21/18 1542    . lidocaine (LIDODERM) 5 % 1 patch  1 patch Transdermal Q24H Arta Silence, MD   1 patch at 06/21/18 0624  . lisinopril (PRINIVIL,ZESTRIL) tablet 10 mg  10 mg Oral Daily Arta Silence, MD   10 mg at 06/21/18 1122  . ondansetron (ZOFRAN) tablet 4 mg  4 mg Oral Q6H PRN Arta Silence, MD       Or  . ondansetron (ZOFRAN) injection 4 mg  4 mg Intravenous Q6H PRN Arta Silence, MD      . rosuvastatin (CRESTOR) tablet 20 mg  20 mg Oral QHS Arta Silence, MD      . senna-docusate (Senokot-S) tablet 1 tablet  1 tablet Oral QHS PRN Arta Silence, MD         Discharge Medications: Please see discharge summary for a list of discharge medications.  Relevant Imaging Results:  Relevant Lab Results:   Additional Information (SSN: [id number])  Fishel Wamble, Veronia Beets, LCSW

## 2018-06-21 NOTE — Progress Notes (Addendum)
Pt complaining of feeling like his throat is about to close. He said he feels like his throat is swollen, something is stuck there and is having difficulty breathing. No redness or swelling noted.  O2 sat 100 on room air. Rispitory called. Md paged.

## 2018-06-21 NOTE — ED Notes (Signed)
Bladder scan 30ml

## 2018-06-21 NOTE — Progress Notes (Signed)
Patient seen and briefly examined.  Agree with admitting MD plan.  MRI and x-rays are still pending as patient is still complaining of pain.  Physical therapy is recommending skilled nursing facility at discharge.  Clinical social worker consult placed.  May need further consultant advice after imaging has been performed.

## 2018-06-21 NOTE — Clinical Social Work Note (Signed)
Clinical Social Work Assessment  Patient Details  Name: Jamie Finley MRN: [id number] Date of Birth: 12-19-1960  Date of referral:  06/21/18               Reason for consult:  Discharge Planning                Permission sought to share information with:  Case Manager Permission granted to share information::  Yes, Verbal Permission Granted  Name::        Agency::     Relationship::     Contact Information:     Housing/Transportation Living arrangements for the past 2 months:  Single Family Home Source of Information:  Patient Patient Interpreter Needed:  None Criminal Activity/Legal Involvement Pertinent to Current Situation/Hospitalization:  No - Comment as needed Significant Relationships:  Siblings Lives with:  Self Do you feel safe going back to the place where you live?  Yes Need for family participation in patient care:  Yes (Comment)  Care giving concerns:  Patient lives alone in [place].    Social Worker assessment / plan:  Holiday representative (CSW) reviewed chart and noted that PT is recommending SNF. Patient has no health insurance. CSW met with patient alone at bedside to discuss D/C plan. Patient was alert and oriented X4 and was sitting up in the bed. CSW introduced self and explained role of CSW department. Per patient he lives alone in [place] and does not have much income. 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 Per patient he does not receive SSI or disability. CSW explained that PT is recommending SNF. Per patient he can't pay privately for SNF. CSW explained that since [facility] does not have a payer for SNF he will have to D/C home. Patient verbalized his understanding. RN case manager aware of above. CSW will continue to follow and assist as needed.    Employment status:  Systems developer information:  Self Pay (Medicaid Pending) PT Recommendations:  [place] / Referral to community  resources:  Other (Comment Required)(Patient has no payer for SNF and will have to D/C home. )  Patient/Family's Response to care:  Patient will have to D/C home.   Patient/Family's Understanding of and Emotional Response to Diagnosis, Current Treatment, and Prognosis:  Patient was pleasant and thanked CSW for visit.   Emotional Assessment Appearance:  Appears stated age Attitude/Demeanor/Rapport:    Affect (typically observed):  Accepting, Adaptable, Pleasant Orientation:  Oriented to Self, Oriented to Place, Oriented to  Time, Oriented to Situation Alcohol / Substance use:  Not Applicable Psych involvement (Current and /or in the community):  No (Comment)  Discharge Needs  Concerns to be addressed:  Discharge Planning Concerns Readmission within the last 30 days:  No Current discharge risk:  Dependent with Mobility Barriers to Discharge:  Continued Medical Work up   [employer], Veronia Beets, LCSW 06/21/2018, 4:01 PM

## 2018-06-21 NOTE — Care Management Note (Signed)
Case Management Note  Patient Details  Name: Jamie Finley MRN: [id number] Date of Birth: 1961/04/04  Subjective/Objective:                  RNCM met with patient to discuss transition of care.  PT is recommending bariatric walker which has been [facility]. He states he is currently spending down and applying for Medicaid.  He is from home where he was independent and able to drive up until two days ago. He states this pain has set him back. He goes to Medication management for medications assistance. He said he only has ibuprofen for pain. He still pays out of pocket for PCP at [place] and has an appointment next week. He refuses to use [facility] as he said "its a scam" and they wouldn't see me for 3 months (as a new patient) for this pain and I needed it now".  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  He is not affiliated with [employer]. I doubt he would qualify for charity care as he has a retirement plan in place that he is spending down for Medicaid.   Action/Plan:  Home health list left with patient. [facility]ric walker requested from [facility].  Expected Discharge Date:  06/24/18               Expected Discharge Plan:     In-House Referral:     Discharge planning Services     Post Acute Care Choice:  Durable Medical Equipment, Home Health Choice offered to:  Patient  DME Arranged:  [facility] wide DME Agency:  [place]:  PT [facility] Agency:  [place]  Status of Service:  In process, will continue to follow  If discussed at Long Length of Stay Meetings, dates discussed:    Additional Comments:  Marshell Garfinkel, RN 06/21/2018, 12:34 PM

## 2018-06-21 NOTE — Progress Notes (Signed)
Pt has not voided all day. Bladder scan only showing 53 ml. Pt's starting to develop non pitting edema in right arm. MD Mody paged.

## 2018-06-21 NOTE — Progress Notes (Signed)
Pt reports difficulty swallowing and sore throat. Throat appears red and swollen. MD Mody paged.

## 2018-06-21 NOTE — Progress Notes (Signed)
Verbal order from MD Mody to in and out cath patient. Procedure done. Pt tolerated well. 500 mL of urine obtained. MD verbal order to also place order for MRI of lumbar spine STAT.

## 2018-06-21 NOTE — Progress Notes (Signed)
Pt requesting something for anxiety before MRI. Doctor Bradley Ferris made aware. Order for ativan 1mg  obtained.

## 2018-06-21 NOTE — ED Provider Notes (Signed)
[facility] Emergency Department Provider Note  ____________________________________________   First MD Initiated Contact with Patient 06/21/18 0115     (approximate)  I have reviewed the triage vital signs and the nursing notes.   HISTORY  Chief Complaint Back Pain    HPI Jamie Finley is a 57 y.o. male self presents to the emergency department with acute on chronic low back pain.  Is a long-standing history of chronic low back pain and had surgery performed at [facility] in 2007.  His pain has been more severe for the past several weeks however for the past 24 hours has been acutely worse.  It is severe sharp throbbing aching in his right greater than left low back radiating towards his upper buttocks bilaterally.  He has not fallen.  No particular trauma.  No recent dental work.  No fevers or chills.  No numbness or weakness.  Normal perianal sensation when wiping.  No steroid use.  No IV drug use.  His pain seems to be somewhat improved when leaning forward and somewhat worse with standing up.    Past Medical History:  Diagnosis Date  . Arthritis   . Depression   . Diabetes mellitus without complication ([place])   . Frequent headaches   . Gout   . Hyperlipidemia   . Hypertension   . Stroke [facility])     Patient Active Problem List   Diagnosis Date Noted  . Low back pain 06/21/2018  . Imbalance 05/20/2018  . Hyperlipidemia 05/20/2018  . Acute gout of right elbow 09/25/2017  . Bilateral knee pain 08/24/2017  . Seasonal depression ([place]) 08/24/2017  . Chronic low back pain 02/27/2017  . Acute pain of left hip 11/22/2016  . History of CVA (cerebrovascular accident) 01/15/2016  . Hyperlipidemia associated with type 2 diabetes mellitus ([place]) 02/18/2015  . Peripheral autonomic neuropathy due to diabetes mellitus ([place]) 02/18/2015  . Diabetes mellitus type 2, uncontrolled, with complications ([place]) 35/00/9381  . Hypertension 12/15/2014  . Morbid obesity ([place])  12/15/2014    Past Surgical History:  Procedure Laterality Date  . BACK SURGERY  2007   [facility]  . TONSILLECTOMY      Prior to Admission medications   Medication Sig Start Date End Date Taking? Authorizing Provider  aspirin 81 MG tablet Take 81 mg by mouth daily.   Yes Roselee Nova, MD  clopidogrel (PLAVIX) 75 MG tablet Take 1 tablet (75 mg total) by mouth daily. 03/19/18  Yes Bedsole, Amy E, MD  glipiZIDE (GLUCOTROL) 10 MG tablet Take 1 tablet (10 mg total) by mouth 2 (two) times daily before a meal. 03/19/18  Yes Bedsole, Amy E, MD  hydrochlorothiazide (MICROZIDE) 12.5 MG capsule TAKE ONE TABLET BY MOUTH EVERY DAY 03/19/18  Yes Bedsole, Amy E, MD  lisinopril (PRINIVIL,ZESTRIL) 10 MG tablet Take 1 tablet (10 mg total) by mouth daily. 03/19/18  Yes Bedsole, Amy E, MD  metFORMIN (GLUCOPHAGE) 1000 MG tablet Take 1 tablet (1,000 mg total) by mouth 2 (two) times daily with a meal. 03/19/18  Yes Bedsole, Amy E, MD  rosuvastatin (CRESTOR) 20 MG tablet Take 1 tablet (20 mg total) by mouth at bedtime. 03/19/18  Yes Bedsole, Amy E, MD  meloxicam (MOBIC) 7.5 MG tablet Take 1 tablet (7.5 mg total) by mouth at bedtime. Patient not taking: Reported on 06/21/2018 03/16/18   Loletha Grayer, MD  oxyCODONE (OXY IR/ROXICODONE) 5 MG immediate release tablet Take 1 tablet (5 mg total) by mouth every 6 (six) hours  as needed for moderate pain or severe pain. Patient not taking: Reported on 04/22/2018 03/16/18   Loletha Grayer, MD  predniSONE (DELTASONE) 10 MG tablet Take 2 tablets (20 mg total) by mouth daily. Patient not taking: Reported on 06/21/2018 04/22/18   Delman Kitten, MD    Allergies Patient has no known allergies.  Family History  Problem Relation Age of Onset  . Cancer Mother        face and jaw  . Cancer Father        kidney  . Early death Father   . Diabetes Maternal Grandmother   . Depression Maternal Grandfather   . Diabetes Maternal Grandfather   . Hearing loss Maternal Grandfather     Social  History Social History   Tobacco Use  . Smoking status: Former Smoker    Last attempt to quit: 09/11/1989    Years since quitting: 28.7  . Smokeless tobacco: Never Used  Substance Use Topics  . Alcohol use: Yes  . Drug use: No    Review of Systems Constitutional: No fever/chills Eyes: No visual changes. ENT: No sore throat. Cardiovascular: Denies chest pain. Respiratory: Denies shortness of breath. Gastrointestinal: No abdominal pain.  No nausea, no vomiting.  No diarrhea.  No constipation. Genitourinary: Negative for dysuria. Musculoskeletal: Positive for back pain. Skin: Negative for rash. Neurological: Negative for headaches, focal weakness or numbness.   ____________________________________________   PHYSICAL EXAM:  VITAL SIGNS: ED Triage Vitals  Enc Vitals Group     BP 06/20/18 2152 (!) 107/52     Pulse Rate 06/20/18 2152 89     Resp 06/20/18 2152 18     Temp 06/20/18 2151 97.6 F (36.4 C)     Temp Source 06/20/18 2151 Oral     SpO2 06/20/18 2152 97 %     Weight 06/20/18 2152 (!) 325 lb (147.4 kg)     Height --      Head Circumference --      Peak Flow --      Pain Score 06/20/18 2347 10     Pain Loc --      Pain Edu? --      Excl. in [place]? --     Constitutional: Alert and oriented x4 appears miserable standing up trying to pace around to find a position of comfort leaning forward onto the [employer] stand tearful Eyes: PERRL EOMI. Head: Atraumatic. Nose: No congestion/rhinnorhea. Mouth/Throat: No trismus Neck: No stridor.   Cardiovascular: Normal rate, regular rhythm. Grossly normal heart sounds.  Good peripheral circulation. Respiratory: Normal respiratory effort.  No retractions. Lungs CTAB and moving good air Gastrointestinal: Morbidly obese soft nontender Normal rectal sensation normal rectal tone and squeeze Musculoskeletal: Legs are equal in size Neurologic:  Normal speech and language.  5 out of 5 hip flexion hip extension plantar flexion dorsiflexion  2+ DTRs no ankle clonus normal sensation throughout Skin:  Skin is warm, dry and intact. No rash noted. Psychiatric: Mood and affect are normal. Speech and behavior are normal.    ____________________________________________   DIFFERENTIAL includes but not limited to  Cauda equina syndrome, sciatica, spinal stenosis, radiculopathy ____________________________________________   LABS (all labs ordered are listed, but only abnormal results are displayed)  Labs Reviewed  COMPREHENSIVE METABOLIC PANEL - Abnormal; Notable for the following components:      Result Value   Glucose, Bld 161 (*)    All other components within normal limits  CBC WITH DIFFERENTIAL/PLATELET    Lab work reviewed by me  with no acute disease __________________________________________  EKG   ____________________________________________  RADIOLOGY  MRI of the low back is pending at this time ____________________________________________   PROCEDURES  Procedure(s) performed: no  Procedures  Critical Care performed: no  ____________________________________________   INITIAL IMPRESSION / ASSESSMENT AND PLAN / ED COURSE  Pertinent labs & imaging results that were available during my care of the patient were reviewed by me and considered in my medical decision making (see chart for details).   As part of my medical decision making, I reviewed the following data within the [place] History obtained from family if available, nursing notes, old chart and ekg, as well as notes from prior ED visits.  The patient comes to the emergency department extremely uncomfortable appearing with acute on chronic low back pain.  He denies urinary or fecal hesitance and reports normal sensation although his symptoms are now bilateral down into his buttocks.  Given that he is leaning forward to feel more comfortable I think he very well could have some degree of spinal stenosis.  We will begin with 2 mg  of subcutaneous Dilaudid along with IM ketorolac and oral Valium and reevaluate.  After the subcutaneous and intramuscular medications the patient's pain is essentially unchanged.  We will give him 2 mg of IV Dilaudid now and reevaluate.  After 4 mg of Dilaudid the patient is still in extreme discomfort.  We will give him another 1 mg now but at this point he will require inpatient admission for pain control.     ----------------------------------------- 4:04 AM on 06/21/2018 -----------------------------------------  The patient's pain is now down to a 7 or an 8 out of 10 and he is able to lie in bed although persists with difficulty finding a position of comfort.  He is unable to tolerate an MRI at this time.  He has normal rectal examination and a normal strength examination and his postvoid residual is 30 cc.  I do not believe he has an acute emergent neurosurgical issue however his pain is clearly inadequately controlled and he requires inpatient admission for continued management of his acute on chronic low back pain.  He most likely has spinal stenosis as he seems to be more comfortable when leaning forward.  I discussed with the hospitalist who has graciously agreed to admit the patient to his service. ____________________________________________   FINAL CLINICAL IMPRESSION(S) / ED DIAGNOSES  Final diagnoses:  Acute bilateral low back pain without sciatica  Severe pain      NEW MEDICATIONS STARTED DURING THIS VISIT:  New Prescriptions   No medications on file     Note:  This document was prepared using Dragon voice recognition software and may include unintentional dictation errors.     Darel Hong, MD 06/21/18 [phone]

## 2018-06-22 ENCOUNTER — Inpatient Hospital Stay: Payer: Self-pay

## 2018-06-22 ENCOUNTER — Observation Stay: Payer: Self-pay

## 2018-06-22 DIAGNOSIS — K567 Ileus, unspecified: Secondary | ICD-10-CM | POA: Diagnosis present

## 2018-06-22 LAB — BASIC METABOLIC PANEL
ANION GAP: 7 (ref 5–15)
BUN: 15 mg/dL (ref 6–20)
CALCIUM: 8.9 mg/dL (ref 8.9–10.3)
CHLORIDE: 99 mmol/L (ref 98–111)
CO2: 29 mmol/L (ref 22–32)
Creatinine, Ser: 0.95 mg/dL (ref 0.61–1.24)
GFR calc non Af Amer: >60 mL/min (ref >60)
Glucose, Bld: 125 mg/dL — ABNORMAL HIGH (ref 70–99)
Potassium: 3.8 mmol/L (ref 3.5–5.1)
SODIUM: 135 mmol/L (ref 135–145)

## 2018-06-22 LAB — GLUCOSE, CAPILLARY
GLUCOSE-CAPILLARY: 95 mg/dL (ref 70–99)
Glucose-Capillary: 93 mg/dL (ref 70–99)
Glucose-Capillary: 108 mg/dL — ABNORMAL HIGH (ref 70–99)

## 2018-06-22 MED ORDER — LACTULOSE 10 GM/15ML PO SOLN
30.0000 g | Freq: Two times a day (BID) | ORAL | Status: DC
  Administered 2018-06-22: 30 g via ORAL
  Filled 2018-06-22: qty 60

## 2018-06-22 MED ORDER — SENNOSIDES-DOCUSATE SODIUM 8.6-50 MG PO TABS: 1.0000 | ORAL_TABLET | Freq: Every evening | ORAL | Status: DC | PRN

## 2018-06-22 MED ORDER — LIDOCAINE 5 % EX PTCH: 1.0000 | MEDICATED_PATCH | CUTANEOUS | Status: DC

## 2018-06-22 MED ORDER — POLYETHYLENE GLYCOL 3350 17 G PO PACK: 17.0000 g | PACK | Freq: Every day | ORAL | 0 refills | Status: DC

## 2018-06-22 MED ORDER — BISACODYL 10 MG RE SUPP: 10.0000 mg | Freq: Every day | RECTAL | 0 refills | Status: DC

## 2018-06-22 MED ORDER — DOCUSATE SODIUM 100 MG PO CAPS
100.0000 mg | ORAL_CAPSULE | Freq: Two times a day (BID) | ORAL | Status: DC
  Administered 2018-06-22: 100 mg via ORAL
  Filled 2018-06-22: qty 1

## 2018-06-22 MED ORDER — POLYETHYLENE GLYCOL 3350 17 G PO PACK
17.00 | PACK | ORAL | Status: DC
Start: ? — End: 2018-06-22

## 2018-06-22 MED ORDER — TAMSULOSIN HCL 0.4 MG PO CAPS: 0.4000 mg | ORAL_CAPSULE | Freq: Every day | ORAL | Status: DC

## 2018-06-22 MED ORDER — GENERIC EXTERNAL MEDICATION
650.00 | Status: DC
Start: ? — End: 2018-06-22

## 2018-06-22 MED ORDER — BISACODYL 10 MG RE SUPP
10.0000 mg | Freq: Every day | RECTAL | Status: DC
  Administered 2018-06-22: 10 mg via RECTAL
  Filled 2018-06-22: qty 1

## 2018-06-22 MED ORDER — NYSTATIN 100000 UNIT/ML MT SUSP
5.0000 mL | Freq: Four times a day (QID) | OROMUCOSAL | Status: DC
  Administered 2018-06-22: 500000 [IU] via OROMUCOSAL
  Filled 2018-06-22: qty 5

## 2018-06-22 MED ORDER — BENZONATATE 100 MG PO CAPS
100.0000 mg | ORAL_CAPSULE | Freq: Two times a day (BID) | ORAL | Status: DC
  Administered 2018-06-22: 100 mg via ORAL
  Filled 2018-06-22: qty 1

## 2018-06-22 MED ORDER — LORAZEPAM 2 MG/ML IJ SOLN
4.0000 mg | Freq: Once | INTRAMUSCULAR | Status: AC
  Administered 2018-06-22: 4 mg via INTRAVENOUS
  Filled 2018-06-22: qty 2

## 2018-06-22 MED ORDER — SENNA 8.6 MG PO TABS
1.0000 | ORAL_TABLET | Freq: Every day | ORAL | Status: DC
  Administered 2018-06-22: 8.6 mg via ORAL
  Filled 2018-06-22: qty 1

## 2018-06-22 MED ORDER — LIDOCAINE HCL 1 % IJ SOLN
0.50 | INTRAMUSCULAR | Status: DC
Start: ? — End: 2018-06-22

## 2018-06-22 MED ORDER — POLYETHYLENE GLYCOL 3350 17 G PO PACK
17.0000 g | PACK | Freq: Every day | ORAL | Status: DC
  Administered 2018-06-22: 17 g via ORAL
  Filled 2018-06-22: qty 1

## 2018-06-22 MED ORDER — TAMSULOSIN HCL 0.4 MG PO CAPS
0.4000 mg | ORAL_CAPSULE | Freq: Every day | ORAL | Status: DC
  Administered 2018-06-22: 0.4 mg via ORAL
  Filled 2018-06-22: qty 1

## 2018-06-22 MED ORDER — HEPARIN SODIUM (PORCINE) 5000 UNIT/ML IJ SOLN
5000.00 | INTRAMUSCULAR | Status: DC
Start: 2018-06-23 — End: 2018-06-22

## 2018-06-22 NOTE — Progress Notes (Signed)
Jan Care here to pick up pt. Pt is being transported to [facility].

## 2018-06-22 NOTE — Progress Notes (Signed)
Patient arrived back from MRI, patient is drowsy, lethargic, but arousal to stimuli. VSS. No pain currently per patient. Continue to monitor.

## 2018-06-22 NOTE — Discharge Summary (Signed)
[facility] [place] NAME: Jamie Finley    MR#:  [id number]  DATE OF BIRTH:  October 15, 1960  DATE OF ADMISSION:  06/21/2018 ADMITTING PHYSICIAN: Arta Silence, MD  DATE OF DISCHARGE: 06/22/2018  PRIMARY CARE PHYSICIAN: [name], MD    ADMISSION DIAGNOSIS:  Severe pain [R52] Acute bilateral low back pain without sciatica [M54.5]  DISCHARGE DIAGNOSIS:  Active Problems:   Low back pain   Ileus ([place])   SECONDARY DIAGNOSIS:   Past Medical History:  Diagnosis Date  . Arthritis   . Depression   . Diabetes mellitus without complication ([place])   . Frequent headaches   . Gout   . Hyperlipidemia   . Hypertension   . Stroke [facility])     HOSPITAL COURSE:  57 year old male with known history of chronic back pain with DJD who presents with intractable back pain.  1.  Intractable back pain: X-ray showing severe DJD Patient cannot obtain MRI due to intractable pain. We have tried three times. Concern for possible quada [place] as per [facility] consult. Discussed with Dr Mikki Santee at [facility] and [place] hospitalist at [facility]. Patient to be transferred for MRI LUMBAR SPINE with sedation, since we cannot do this here at [facility] (discussed with Dr Kayleen Memos this am).   2.  Constipation with ileus: Continue  aggressive stool regimen Patient seen this afternoon by Dr Rosana Hoes.  3.  Urinary retention from constipation/Ileus/back pain Continue in and out cath I started Flomax  4.  Essential hypertension: Continue HCTZ and lisinopril  5.  Diabetes: Continue ADA diet with sliding scale    DISCHARGE CONDITIONS AND DIET:  Stable ADA diet  CONSULTS OBTAINED:  Treatment Team:  Arta Silence, MD Claud Kelp, MD Vickie Epley, MD  DRUG ALLERGIES:  No Known Allergies  DISCHARGE MEDICATIONS:   Allergies as of 06/22/2018   No Known Allergies     Medication List    STOP taking these medications   meloxicam  7.5 MG tablet Commonly known as:  MOBIC   oxyCODONE 5 MG immediate release tablet Commonly known as:  Oxy IR/ROXICODONE   predniSONE 10 MG tablet Commonly known as:  DELTASONE     TAKE these medications   aspirin 81 MG tablet Take 81 mg by mouth daily.   bisacodyl 10 MG suppository Commonly known as:  DULCOLAX Place 1 suppository (10 mg total) rectally daily. Start taking on:  06/23/2018   clopidogrel 75 MG tablet Commonly known as:  PLAVIX Take 1 tablet (75 mg total) by mouth daily.   glipiZIDE 10 MG tablet Commonly known as:  GLUCOTROL Take 1 tablet (10 mg total) by mouth 2 (two) times daily before a meal.   hydrochlorothiazide 12.5 MG capsule Commonly known as:  MICROZIDE TAKE ONE TABLET BY MOUTH EVERY DAY   lisinopril 10 MG tablet Commonly known as:  PRINIVIL,ZESTRIL Take 1 tablet (10 mg total) by mouth daily.   metFORMIN 1000 MG tablet Commonly known as:  GLUCOPHAGE Take 1 tablet (1,000 mg total) by mouth 2 (two) times daily with a meal.   polyethylene glycol packet Commonly known as:  MIRALAX / GLYCOLAX Take 17 g by mouth daily.   rosuvastatin 20 MG tablet Commonly known as:  CRESTOR Take 1 tablet (20 mg total) by mouth at bedtime.   senna-docusate 8.6-50 MG tablet Commonly known as:  Senokot-S Take 1 tablet by mouth at bedtime as needed for mild constipation.   tamsulosin 0.4 MG Caps capsule  Commonly known as:  FLOMAX Take 1 capsule (0.4 mg total) by mouth daily. Start taking on:  06/23/2018         Today   CHIEF COMPLAINT:  Patient cannot tolerated MRI Difficulty swallowing this am   VITAL SIGNS:  Blood pressure 134/89, pulse 98, temperature 98.2 F (36.8 C), temperature source Oral, resp. rate 16, weight (!) 147.4 kg, SpO2 96 %.   REVIEW OF SYSTEMS:  Review of Systems  Constitutional: Negative.  Negative for chills, fever and malaise/fatigue.  HENT: Negative.  Negative for ear discharge, ear pain, hearing loss, nosebleeds and sore  throat.   Eyes: Negative.  Negative for blurred vision and pain.  Respiratory: Negative.  Negative for cough, hemoptysis, shortness of breath and wheezing.   Cardiovascular: Negative.  Negative for chest pain, palpitations and leg swelling.  Gastrointestinal: Positive for constipation. Negative for abdominal pain, blood in stool, diarrhea, nausea and vomiting.  Genitourinary: Negative.  Negative for dysuria.  Musculoskeletal: Positive for back pain.  Skin: Negative.   Neurological: Negative for dizziness, tremors, speech change, focal weakness, seizures and headaches.  Endo/Heme/Allergies: Negative.  Does not bruise/bleed easily.  Psychiatric/Behavioral: Negative.  Negative for depression, hallucinations and suicidal ideas.     PHYSICAL EXAMINATION:  GENERAL:  57 y.o.-year-old patient lying in the bed with no acute distress. obese NECK:  Supple, no jugular venous distention. No thyroid enlargement, no tenderness.  LUNGS: Normal breath sounds bilaterally, no wheezing, rales,rhonchi  No use of accessory muscles of respiration.  CARDIOVASCULAR: S1, S2 normal. No murmurs, rubs, or gallops.  ABDOMEN: Soft, non-tender, slightlydistended. Hypoactive Bowel sounds No organomegaly or mass.  EXTREMITIES: No pedal edema, cyanosis, or clubbing.  Cannot move left leg without 10/10 pain PSYCHIATRIC: The patient is alert and oriented x 3.  SKIN: No obvious rash, lesion, or ulcer.   DATA REVIEW:   CBC Recent Labs  Lab 06/21/18 0246  WBC 9.2  HGB 14.2  HCT 41.9  PLT 289    Chemistries  Recent Labs  Lab 06/21/18 0246 06/22/18 0302  NA 139 135  K 3.9 3.8  CL 105 99  CO2 25 29  GLUCOSE 161* 125*  BUN 17 15  CREATININE 1.21 0.95  CALCIUM 9.5 8.9  AST 25  --   ALT 28  --   ALKPHOS 57  --   BILITOT 1.0  --     Cardiac Enzymes No results for input(s): TROPONINI in the last 168 hours.  Microbiology Results  @MICRORSLT48 @  RADIOLOGY:  Dg Lumbar Spine 2-3 Views  Result Date:  06/22/2018 CLINICAL DATA:  Low back pain for 1 month. Bilateral hip and leg pain. Unable to stand for 2 days. EXAM: LUMBAR SPINE - 2-3 VIEW COMPARISON:  Lumbar spine radiographs 10/30/2013 FINDINGS: Progressive endplate degenerative changes are present at L3-4 and L4-5. No acute fracture traumatic subluxation is present. Vertebral body heights are otherwise normal. Slight rightward curvature is centered at L3-4 with asymmetric degenerative changes on the left. Visualized pelvis is within normal limits. There is mild diffuse gaseous distention of loops of large and small bowel. IMPRESSION: 1. 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 2. No acute abnormality. 3. Mild diffuse distention of bowel.  Question ileus. Electronically Signed   By: San Morelle M.D.   On: 06/22/2018 08:17      Allergies as of 06/22/2018   No Known Allergies     Medication List    STOP taking these medications  meloxicam 7.5 MG tablet Commonly known as:  MOBIC   oxyCODONE 5 MG immediate release tablet Commonly known as:  Oxy IR/ROXICODONE   predniSONE 10 MG tablet Commonly known as:  DELTASONE     TAKE these medications   aspirin 81 MG tablet Take 81 mg by mouth daily.   bisacodyl 10 MG suppository Commonly known as:  DULCOLAX Place 1 suppository (10 mg total) rectally daily. Start taking on:  06/23/2018   clopidogrel 75 MG tablet Commonly known as:  PLAVIX Take 1 tablet (75 mg total) by mouth daily.   glipiZIDE 10 MG tablet Commonly known as:  GLUCOTROL Take 1 tablet (10 mg total) by mouth 2 (two) times daily before a meal.   hydrochlorothiazide 12.5 MG capsule Commonly known as:  MICROZIDE TAKE ONE TABLET BY MOUTH EVERY DAY   lisinopril 10 MG tablet Commonly known as:  PRINIVIL,ZESTRIL Take 1 tablet (10 mg total) by mouth daily.   metFORMIN 1000 MG tablet Commonly known as:  GLUCOPHAGE Take 1 tablet (1,000 mg total)  by mouth 2 (two) times daily with a meal.   polyethylene glycol packet Commonly known as:  MIRALAX / GLYCOLAX Take 17 g by mouth daily.   rosuvastatin 20 MG tablet Commonly known as:  CRESTOR Take 1 tablet (20 mg total) by mouth at bedtime.   senna-docusate 8.6-50 MG tablet Commonly known as:  Senokot-S Take 1 tablet by mouth at bedtime as needed for mild constipation.   tamsulosin 0.4 MG Caps capsule Commonly known as:  FLOMAX Take 1 capsule (0.4 mg total) by mouth daily. Start taking on:  06/23/2018          Management plans discussed with the patient and he is in agreement. Stable for discharge dy[place]  Patient should follow up with [facility]  CODE STATUS:     Code Status Orders  (From admission, onward)         Start     Ordered   06/21/18 0436  Full code  Continuous     06/21/18 0435        Code Status History    Date Active Date Inactive Code Status Order ID Comments User Context   03/14/2018 1828 03/16/2018 1737 Full Code [id number]  Vaughan Basta, MD Inpatient   01/15/2016 2347 01/16/2016 2027 Full Code [id number]  Lance Coon, MD ED      TOTAL TIME TAKING CARE OF THIS PATIENT: 42 minutes.    Note: This dictation was prepared with Dragon dictation along with smaller phrase technology. Any transcriptional errors that result from this process are unintentional.  Mihran Lebarron M.D on 06/22/2018 at 4:11 PM  Between 7am to 6pm - Pager - [phone] After 6pm go to [email or web address] - password [email or web address] [facility]  Office  [phone]  CC: Primary care physician; [name], MD

## 2018-06-22 NOTE — Progress Notes (Signed)
Pt has not voided this shift. Bladder scan done. 544 mls obtained. Dr. Jodell Cipro notified. Order for in and out cath obtained.

## 2018-06-22 NOTE — Progress Notes (Signed)
Pt went to MRI. MRI was not done. PT refused. Doctor Maeir made aware.

## 2018-06-22 NOTE — Consult Note (Signed)
SURGICAL CONSULTATION NOTE (initial) - cpt: [id number]  HISTORY OF PRESENT ILLNESS (HPI):  57 y.o. male presented to [facility] ED 36 hours ago for evaluation of severe acute on chronic lower back pain. Patient reports his back pain is currently controlled as long as he doesn't move. This morning he underwent lumbar spine x-ray, which demonstrated colonic ileus, for which surgery is consulted. He describes he had a BM last night and says his abdomen currently feels full as if he expects another one soon. He denies routine/chronic constipation, though acknowledges pain medications can cause him to strain sometimes. He otherwise reports +flatus and has been tolerating regular diet, denies abdominal pain, N/V, fever/chills, CP, or SOB. Patient was seen this morning by neurosurgery and is pending MRI.  Surgery is consulted by medical physician Dr. Benjie Karvonen in this context for evaluation and management of constipation/colonic ileus.  PAST MEDICAL HISTORY (PMH):  Past Medical History:  Diagnosis Date  . Arthritis   . Depression   . Diabetes mellitus without complication ([place])   . Frequent headaches   . Gout   . Hyperlipidemia   . Hypertension   . Stroke [facility])      PAST SURGICAL HISTORY ([place]):  Past Surgical History:  Procedure Laterality Date  . BACK SURGERY  2007   [facility]  . TONSILLECTOMY       MEDICATIONS:  Prior to Admission medications   Medication Sig Start Date End Date Taking? Authorizing Provider  aspirin 81 MG tablet Take 81 mg by mouth daily.   Yes Roselee Nova, MD  clopidogrel (PLAVIX) 75 MG tablet Take 1 tablet (75 mg total) by mouth daily. 03/19/18  Yes Bedsole, Amy E, MD  glipiZIDE (GLUCOTROL) 10 MG tablet Take 1 tablet (10 mg total) by mouth 2 (two) times daily before a meal. 03/19/18  Yes Bedsole, Amy E, MD  hydrochlorothiazide (MICROZIDE) 12.5 MG capsule TAKE ONE TABLET BY MOUTH EVERY DAY 03/19/18  Yes Bedsole, Amy E, MD  lisinopril (PRINIVIL,ZESTRIL) 10 MG tablet Take 1 tablet (10 mg  total) by mouth daily. 03/19/18  Yes Bedsole, Amy E, MD  metFORMIN (GLUCOPHAGE) 1000 MG tablet Take 1 tablet (1,000 mg total) by mouth 2 (two) times daily with a meal. 03/19/18  Yes Bedsole, Amy E, MD  rosuvastatin (CRESTOR) 20 MG tablet Take 1 tablet (20 mg total) by mouth at bedtime. 03/19/18  Yes Bedsole, Amy E, MD  meloxicam (MOBIC) 7.5 MG tablet Take 1 tablet (7.5 mg total) by mouth at bedtime. Patient not taking: Reported on 06/21/2018 03/16/18   Loletha Grayer, MD  oxyCODONE (OXY IR/ROXICODONE) 5 MG immediate release tablet Take 1 tablet (5 mg total) by mouth every 6 (six) hours as needed for moderate pain or severe pain. Patient not taking: Reported on 04/22/2018 03/16/18   Loletha Grayer, MD  predniSONE (DELTASONE) 10 MG tablet Take 2 tablets (20 mg total) by mouth daily. Patient not taking: Reported on 06/21/2018 04/22/18   Delman Kitten, MD     ALLERGIES:  No Known Allergies   SOCIAL HISTORY:  Social History   Socioeconomic History  . Marital status: Single    Spouse name: Not on file  . Number of children: Not on file  . Years of education: Not on file  . Highest education level: Not on file  Occupational History  . Not on file  Social Needs  . Financial resource strain: Not on file  . Food insecurity:    Worry: Not on file    Inability: Not  on file  . Transportation needs:    Medical: Not on file    Non-medical: Not on file  Tobacco Use  . Smoking status: Former Smoker    Last attempt to quit: 09/11/1989    Years since quitting: 28.7  . Smokeless tobacco: Never Used  Substance and Sexual Activity  . Alcohol use: Yes  . Drug use: No  . Sexual activity: Not Currently  Lifestyle  . Physical activity:    Days per week: Not on file    Minutes per session: Not on file  . Stress: Not on file  Relationships  . Social connections:    Talks on phone: Not on file    Gets together: Not on file    Attends religious service: Not on file    Active member of club or organization:  Not on file    Attends meetings of clubs or organizations: Not on file    Relationship status: Not on file  . Intimate partner violence:    Fear of current or ex partner: Not on file    Emotionally abused: Not on file    Physically abused: Not on file    Forced sexual activity: Not on file  Other Topics Concern  . Not on file  Social History Narrative  . Not on file    The patient currently resides (home / rehab facility / nursing home): Home The patient normally is (ambulatory / bedbound): Ambulatory   FAMILY HISTORY:  Family History  Problem Relation Age of Onset  . Cancer Mother        face and jaw  . Cancer Father        kidney  . Early death Father   . Diabetes Maternal Grandmother   . Depression Maternal Grandfather   . Diabetes Maternal Grandfather   . Hearing loss Maternal Grandfather      REVIEW OF SYSTEMS:  Constitutional: denies weight loss, fever, chills, or sweats  Eyes: denies any other vision changes, history of eye injury  ENT: denies sore throat, hearing problems  Respiratory: denies shortness of breath, wheezing  Cardiovascular: denies chest pain, palpitations  Gastrointestinal: abdominal pain, N/V, and bowel function as per HPI Genitourinary: denies burning with urination or urinary frequency Musculoskeletal: denies any other joint pains or cramps except back pain  Skin: denies any other rashes or skin discolorations  Neurological: denies any other headache, dizziness, weakness  Psychiatric: denies any other depression, anxiety   All other review of systems were negative   VITAL SIGNS:  Temp:  [97.3 F (36.3 C)-97.9 F (36.6 C)] 97.3 F (36.3 C) (10/12 0805) Pulse Rate:  [67-93] 67 (10/12 0805) Resp:  [19-20] 20 (10/12 0805) BP: (117-146)/(89-98) 146/92 (10/12 0805) SpO2:  [95 %-98 %] 98 % (10/12 0805)       Weight: (!) 147.4 kg     INTAKE/OUTPUT:  This shift: Total I/O In: -  Out: 1000 [Urine:1000]  Last 2 shifts: @IOLAST2SHIFTS @    PHYSICAL EXAM:  Constitutional:  -- Obese body habitus  -- Awake, alert, and oriented x3, no apparent distress Eyes:  -- Pupils equally round and reactive to light  -- No scleral icterus, B/L no occular discharge Ear, nose, throat: -- Neck is FROM WNL -- No jugular venous distension  Pulmonary:  -- No wheezes or rhales -- Equal breath sounds bilaterally -- Breathing non-labored at rest Cardiovascular:  -- S1, S2 present  -- No pericardial rubs  Gastrointestinal:  -- Abdomen soft, nontender, and obese with  no guarding or rebound tenderness -- No abdominal masses appreciated, pulsatile or otherwise  Musculoskeletal and Integumentary:  -- Wounds or skin discoloration: None appreciated -- Extremities: B/L UE and LE FROM, hands and feet warm  Neurologic:  -- Motor function: Intact and symmetric -- Sensation: Intact and symmetric Psychiatric:  -- Mood and affect WNL  Labs:  CBC Latest Ref Rng & Units 06/21/2018 06/20/2018 04/22/2018  WBC 4.0 - 10.5 K/uL 9.2 7.5 12.1(H)  Hemoglobin 13.0 - 17.0 g/dL 14.2 13.3 13.1  Hematocrit 39.0 - 52.0 % 41.9 39.6 38.8(L)  Platelets 150 - 400 K/uL 289 257.0 277   CMP Latest Ref Rng & Units 06/22/2018 06/21/2018 06/20/2018  Glucose 70 - 99 mg/dL 125(H) 161(H) 96  BUN 6 - 20 mg/dL 15 17 15   Creatinine 0.61 - 1.24 mg/dL 0.95 1.21 1.10  Sodium 135 - 145 mmol/L 135 139 140  Potassium 3.5 - 5.1 mmol/L 3.8 3.9 3.9  Chloride 98 - 111 mmol/L 99 105 101  CO2 22 - 32 mmol/L 29 25 30   Calcium 8.9 - 10.3 mg/dL 8.9 9.5 9.5  Total Protein 6.5 - 8.1 g/dL - 7.9 6.8  Total Bilirubin 0.3 - 1.2 mg/dL - 1.0 0.8  Alkaline Phos 38 - 126 U/L - 57 56  AST 15 - 41 U/L - 25 18  ALT 0 - 44 U/L - 28 18   Imaging studies:  Lumbar Spine X-ray (06/22/2018) - personally reviewed and discussed with patient 1. 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 2. No acute abnormality. 3. Mild diffuse  distention of bowel.  Question ileus.  Assessment/Plan: (ICD-10's: K30.7) 57 y.o. male with narcotic-associated ileus/constipation, complicated by pertinent comorbidities including morbid obesity (BMI >44), DM with peripheral neuropathy, HTN, HLD, history of stroke, osteoarthritis with chronic lower back pain s/p spine surgery at [facility] (2007), frequent headaches, gout, and major depression disorder.   - minimize narcotics  - ensure adequate hydration, high-fiber diet  - routine Colace stool softener 1-2x daily to reduce risk of new constipation   - Miralax +/- magnesium citrate as needed to treat constipation +/- enema(s) as needed  - medical management as per primary medical team   - DVT prophylaxis, ambulation as able  - long-term weight loss encouraged  All of the above findings and recommendations were discussed with the patient, and all of patient's questions were answered to his expressed satisfaction.  Thank you for the opportunity to participate in this patient's care.   -- Marilynne Drivers Rosana Hoes, MD, [place]: [facility] - Partnering for exceptional care. Office: [phone]

## 2018-06-22 NOTE — Progress Notes (Signed)
Patient is supposed to be transported to [facility] when bed is available. Brother of patient Artist) notified of updated plan.

## 2018-06-22 NOTE — Progress Notes (Signed)
Patient resting in bed. Urinal within reach. Pain controlled at the moment. Call bell in reach, bed lowest position. Continue to monitor.

## 2018-06-22 NOTE — Progress Notes (Signed)
[place] [facility] [place] NAME: Jamie Finley    MR#:  [id number]  DATE OF BIRTH:  [birth year]  SUBJECTIVE:   back pain  And difficulty having left leg. Urinary retention Constipation  Tolerating diet somewhat Having difficulty swallowing REVIEW OF SYSTEMS:    Review of Systems  Constitutional: Negative for fever, chills weight loss HENT: Negative for ear pain, nosebleeds, congestion, facial swelling, rhinorrhea, neck pain, neck stiffness and ear discharge.   Difficulty swallowing Respiratory: Negative for cough, shortness of breath, wheezing  Cardiovascular: Negative for chest pain, palpitations and leg swelling.  Gastrointestinal: Negative for heartburn, abdominal pain, vomiting, diarrhea ++consitpation Genitourinary: Negative for dysuria, urgency, frequency, hematuria Musculoskeletal: ++ back pain  Neurological: Negative for dizziness, seizures, syncope, focal weakness,  numbness and headaches.  Hematological: Does not bruise/bleed easily.  Psychiatric/Behavioral: Negative for hallucinations, confusion, dysphoric mood    Tolerating Diet: yes      DRUG ALLERGIES:  No Known Allergies  VITALS:  Blood pressure (!) 146/92, pulse 67, temperature (!) 97.3 F (36.3 C), temperature source Axillary, resp. rate 20, weight (!) 147.4 kg, SpO2 98 %.  PHYSICAL EXAMINATION:  Constitutional: Appears well-developed and well-nourished. No distress. HENT: Normocephalic. Marland Kitchen Oropharynx is clear and moist.  Eyes: Conjunctivae and EOM are normal. PERRLA, no scleral icterus.  Neck: Normal ROM. Neck supple. No JVD. No tracheal deviation. CVS: RRR, S1/S2 +, no murmurs, no gallops, no carotid bruit.  Pulmonary: Effort and breath sounds normal, no stridor, rhonchi, wheezes, rales.  Abdominal: Soft. BS +,  mild distension, NO tenderness, rebound or guarding.  Musculoskeletal:cannot move left leg due to back pain  No edema and no tenderness.  Neuro:  Alert. CN 2-12 grossly intact. No focal deficits. Skin: Skin is warm and dry. No rash noted. Psychiatric: Normal mood and affect.      LABORATORY PANEL:   CBC Recent Labs  Lab 06/21/18 0246  WBC 9.2  HGB 14.2  HCT 41.9  PLT 289   ------------------------------------------------------------------------------------------------------------------  Chemistries  Recent Labs  Lab 06/21/18 0246 06/22/18 0302  NA 139 135  K 3.9 3.8  CL 105 99  CO2 25 29  GLUCOSE 161* 125*  BUN 17 15  CREATININE 1.21 0.95  CALCIUM 9.5 8.9  AST 25  --   ALT 28  --   ALKPHOS 57  --   BILITOT 1.0  --    ------------------------------------------------------------------------------------------------------------------  Cardiac Enzymes No results for input(s): TROPONINI in the last 168 hours. ------------------------------------------------------------------------------------------------------------------  RADIOLOGY:  Dg Lumbar Spine 2-3 Views  Result Date: 06/22/2018 CLINICAL DATA:  Low back pain for 1 month. Bilateral hip and leg pain. Unable to stand for 2 days. EXAM: LUMBAR SPINE - 2-3 VIEW COMPARISON:  Lumbar spine radiographs 10/30/2013 FINDINGS: Progressive endplate degenerative changes are present at L3-4 and L4-5. No acute fracture traumatic subluxation is present. Vertebral body heights are otherwise normal. Slight rightward curvature is centered at L3-4 with asymmetric degenerative changes on the left. Visualized pelvis is within normal limits. There is mild diffuse gaseous distention of loops of large and small bowel. IMPRESSION: 1. 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 2. No acute abnormality. 3. Mild diffuse distention of bowel.  Question ileus. Electronically Signed   By: San Morelle M.D.   On: 06/22/2018 08:17     ASSESSMENT AND PLAN:   57 year old male with known history of chronic back pain with  DJD who presents with intractable  back pain.  1.  Intractable back pain: X-ray showing severe DJD Orthopedic surgery consultation for possible steroid injection Continue pain medications  2.  Constipation with ileus KUB ordered Aggressive stool regimen Surgery consult placed through Epic  3.  Urinary retention from constipation Continue in and out cath Flomax  4.  Essential hypertension: Continue HCTZ and lisinopril  5.  Diabetes: Continue ADA diet with sliding scale      Management plans discussed with the patient and he is in agreement.  CODE STATUS: full  TOTAL TIME TAKING CARE OF THIS PATIENT: 28 minutes.     POSSIBLE D/C 1-2 days, DEPENDING ON CLINICAL CONDITION.   Day Deery M.D on 06/22/2018 at 10:42 AM  Between 7am to 6pm - Pager - [phone] After 6pm go to [email or web address] - password [email or web address] [place] [facility]  Office  [phone]  CC: Primary care physician; [name], MD  Note: This dictation was prepared with Dragon dictation along with smaller phrase technology. Any transcriptional errors that result from this process are unintentional.

## 2018-06-22 NOTE — Progress Notes (Signed)
I Spoke with patient's brother who agrees with plan.  Patient currenly sedated from meds for MRI

## 2018-06-22 NOTE — Progress Notes (Signed)
Patiemt voided 150cc. Still feels like he needs to urinate. States he is still not able to feel much in his legs or able to urinate like he needs to. MD notified, orders for in and out cath placed.

## 2018-06-22 NOTE — Progress Notes (Signed)
MRI called stating patient was unable to complete MRI due to pain and not being able to lay flat. Dr Unice Bailey and Dr Benjie Karvonen notified.

## 2018-06-22 NOTE — Progress Notes (Signed)
Jamie Finley [phone]) to arrive within an hour for transport to [facility].

## 2018-06-22 NOTE — Consult Note (Signed)
ORTHOPAEDIC CONSULTATION  REQUESTING PHYSICIAN: Bettey Costa, MD  Chief Complaint: low back pain  HPI: Jamie Finley is a 57 y.o. male who complains of  3 week h/o insidious onset of LBP. He has a history of l-spine microdiscectomy 12 years ago. He denies any loss of bowel/bladder control, but is having difficulty with urinary retention requiring intermittent in and out catheterization as well as constipation. He is a diabetic with some peripheral neuropathy as well.  Past Medical History:  Diagnosis Date  . Arthritis   . Depression   . Diabetes mellitus without complication ([place])   . Frequent headaches   . Gout   . Hyperlipidemia   . Hypertension   . Stroke [facility])    Past Surgical History:  Procedure Laterality Date  . BACK SURGERY  2007   [facility]  . TONSILLECTOMY     Social History   Socioeconomic History  . Marital status: Single    Spouse name: Not on file  . Number of children: Not on file  . Years of education: Not on file  . Highest education level: Not on file  Occupational History  . Not on file  Social Needs  . Financial resource strain: Not on file  . Food insecurity:    Worry: Not on file    Inability: Not on file  . Transportation needs:    Medical: Not on file    Non-medical: Not on file  Tobacco Use  . Smoking status: Former Smoker    Last attempt to quit: 09/11/1989    Years since quitting: 28.7  . Smokeless tobacco: Never Used  Substance and Sexual Activity  . Alcohol use: Yes  . Drug use: No  . Sexual activity: Not Currently  Lifestyle  . Physical activity:    Days per week: Not on file    Minutes per session: Not on file  . Stress: Not on file  Relationships  . Social connections:    Talks on phone: Not on file    Gets together: Not on file    Attends religious service: Not on file    Active member of club or organization: Not on file    Attends meetings of clubs or organizations: Not on file    Relationship status: Not on file  Other  Topics Concern  . Not on file  Social History Narrative  . Not on file   Family History  Problem Relation Age of Onset  . Cancer Mother        face and jaw  . Cancer Father        kidney  . Early death Father   . Diabetes Maternal Grandmother   . Depression Maternal Grandfather   . Diabetes Maternal Grandfather   . Hearing loss Maternal Grandfather    No Known Allergies Prior to Admission medications   Medication Sig Start Date End Date Taking? Authorizing Provider  aspirin 81 MG tablet Take 81 mg by mouth daily.   Yes Roselee Nova, MD  clopidogrel (PLAVIX) 75 MG tablet Take 1 tablet (75 mg total) by mouth daily. 03/19/18  Yes Bedsole, Amy E, MD  glipiZIDE (GLUCOTROL) 10 MG tablet Take 1 tablet (10 mg total) by mouth 2 (two) times daily before a meal. 03/19/18  Yes Bedsole, Amy E, MD  hydrochlorothiazide (MICROZIDE) 12.5 MG capsule TAKE ONE TABLET BY MOUTH EVERY DAY 03/19/18  Yes Bedsole, Amy E, MD  lisinopril (PRINIVIL,ZESTRIL) 10 MG tablet Take 1 tablet (10 mg total) by mouth  daily. 03/19/18  Yes Bedsole, Amy E, MD  metFORMIN (GLUCOPHAGE) 1000 MG tablet Take 1 tablet (1,000 mg total) by mouth 2 (two) times daily with a meal. 03/19/18  Yes Bedsole, Amy E, MD  rosuvastatin (CRESTOR) 20 MG tablet Take 1 tablet (20 mg total) by mouth at bedtime. 03/19/18  Yes Bedsole, Amy E, MD  meloxicam (MOBIC) 7.5 MG tablet Take 1 tablet (7.5 mg total) by mouth at bedtime. Patient not taking: Reported on 06/21/2018 03/16/18   Loletha Grayer, MD  oxyCODONE (OXY IR/ROXICODONE) 5 MG immediate release tablet Take 1 tablet (5 mg total) by mouth every 6 (six) hours as needed for moderate pain or severe pain. Patient not taking: Reported on 04/22/2018 03/16/18   Loletha Grayer, MD  predniSONE (DELTASONE) 10 MG tablet Take 2 tablets (20 mg total) by mouth daily. Patient not taking: Reported on 06/21/2018 04/22/18   Delman Kitten, MD   Dg Lumbar Spine 2-3 Views  Result Date: 06/22/2018 CLINICAL DATA:  Low back pain  for 1 month. Bilateral hip and leg pain. Unable to stand for 2 days. EXAM: LUMBAR SPINE - 2-3 VIEW COMPARISON:  Lumbar spine radiographs 10/30/2013 FINDINGS: Progressive endplate degenerative changes are present at L3-4 and L4-5. No acute fracture traumatic subluxation is present. Vertebral body heights are otherwise normal. Slight rightward curvature is centered at L3-4 with asymmetric degenerative changes on the left. Visualized pelvis is within normal limits. There is mild diffuse gaseous distention of loops of large and small bowel. IMPRESSION: 1. 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 2. No acute abnormality. 3. Mild diffuse distention of bowel.  Question ileus. Electronically Signed   By: San Morelle M.D.   On: 06/22/2018 08:17    Positive ROS: All other systems have been reviewed and were otherwise negative with the exception of those mentioned in the HPI and as above.  Physical Exam: General: Alert, no acute distress Cardiovascular: No pedal edema Respiratory: No cyanosis, no use of accessory musculature GI: No organomegaly, abdomen is soft and non-tender Skin: No lesions in the area of chief complaint Neurologic: Sensation intact distally Psychiatric: Patient is competent for consent with normal mood and affect Lymphatic: No axillary or cervical lymphadenopathy  MUSCULOSKELETAL: some decreased sensation to light touch to BLE. Sensation intact in perigenital and perianal regions, although he complains of subjective tingling in his genital region. Negative SLR and crossed SLE. 5/5 DF/PF bilaterally, KF/KE/HF 4/5 on the right, but limited secondary to pain.  Assessment: Acute on chronic LBP with some clinical concerns for cauda equina syndrome.  Plan: Neurosurgery eval and STAT MRI to rule out cauda equina.       06/22/2018 11:53 AM

## 2018-06-23 MED ORDER — SENNOSIDES-DOCUSATE SODIUM 8.6-50 MG PO TABS
2.00 | ORAL_TABLET | ORAL | Status: DC
Start: ? — End: 2018-06-23

## 2018-06-23 MED ORDER — SODIUM CHLORIDE 0.45 % IV SOLN
INTRAVENOUS | Status: DC
Start: ? — End: 2018-06-23

## 2018-06-23 MED ORDER — GENERIC EXTERNAL MEDICATION
1.00 | Status: DC
Start: ? — End: 2018-06-23

## 2018-06-23 MED ORDER — GELATIN ABSORBABLE 100 EX MISC
CUTANEOUS | Status: DC
Start: ? — End: 2018-06-23

## 2018-06-23 MED ORDER — ROSUVASTATIN CALCIUM 20 MG PO TABS
20.00 | ORAL_TABLET | ORAL | Status: DC
Start: 2018-06-30 — End: 2018-06-23

## 2018-06-23 MED ORDER — GENERIC EXTERNAL MEDICATION
Status: DC
Start: ? — End: 2018-06-23

## 2018-06-23 MED ORDER — INSULIN LISPRO 100 UNIT/ML ~~LOC~~ SOLN
0.00 | SUBCUTANEOUS | Status: DC
Start: 2018-06-26 — End: 2018-06-23

## 2018-06-23 MED ORDER — BISACODYL 10 MG RE SUPP
10.00 | RECTAL | Status: DC
Start: 2018-06-26 — End: 2018-06-23

## 2018-06-23 MED ORDER — LACTATED RINGERS IR SOLN
Status: DC
Start: ? — End: 2018-06-23

## 2018-06-23 MED ORDER — TAMSULOSIN HCL 0.4 MG PO CAPS
0.40 | ORAL_CAPSULE | ORAL | Status: DC
Start: 2018-07-01 — End: 2018-06-23

## 2018-06-23 MED ORDER — BUPIVACAINE-EPINEPHRINE (PF) 0.25% -1:200000 IJ SOLN
INTRAMUSCULAR | Status: DC
Start: ? — End: 2018-06-23

## 2018-06-23 MED ORDER — POLYETHYLENE GLYCOL 3350 17 G PO PACK
17.00 | PACK | ORAL | Status: DC
Start: 2018-06-25 — End: 2018-06-23

## 2018-06-23 MED ORDER — DEXTROSE 50 % IV SOLN
12.50 | INTRAVENOUS | Status: DC
Start: ? — End: 2018-06-23

## 2018-06-23 MED ORDER — STERILE WATER FOR IRRIGATION IR SOLN
Status: DC
Start: ? — End: 2018-06-23

## 2018-06-24 MED ORDER — ALUM & MAG HYDROXIDE-SIMETH 400-400-40 MG/5ML PO SUSP
30.00 | ORAL | Status: DC
Start: ? — End: 2018-06-24

## 2018-06-24 MED ORDER — RANITIDINE HCL 150 MG PO TABS
150.00 | ORAL_TABLET | ORAL | Status: DC
Start: ? — End: 2018-06-24

## 2018-06-24 MED ORDER — SENNOSIDES-DOCUSATE SODIUM 8.6-50 MG PO TABS
1.00 | ORAL_TABLET | ORAL | Status: DC
Start: 2018-06-30 — End: 2018-06-24

## 2018-06-24 MED ORDER — GENERIC EXTERNAL MEDICATION
4.00 | Status: DC
Start: ? — End: 2018-06-24

## 2018-06-24 MED ORDER — BISACODYL 10 MG RE SUPP
10.00 | RECTAL | Status: DC
Start: ? — End: 2018-06-24

## 2018-06-24 MED ORDER — POLYETHYLENE GLYCOL 3350 17 G PO PACK
17.00 | PACK | ORAL | Status: DC
Start: 2018-07-01 — End: 2018-06-24

## 2018-06-24 MED ORDER — SIMETHICONE 80 MG PO CHEW
80.00 | CHEWABLE_TABLET | ORAL | Status: DC
Start: ? — End: 2018-06-24

## 2018-06-24 MED ORDER — METHOCARBAMOL 500 MG PO TABS
750.00 | ORAL_TABLET | ORAL | Status: DC
Start: 2018-06-30 — End: 2018-06-24

## 2018-06-24 MED ORDER — OXYCODONE HCL 5 MG PO TABS
5.00 | ORAL_TABLET | ORAL | Status: DC
Start: ? — End: 2018-06-24

## 2018-06-24 MED ORDER — GABAPENTIN 300 MG PO CAPS
300.00 | ORAL_CAPSULE | ORAL | Status: DC
Start: 2018-06-30 — End: 2018-06-24

## 2018-06-24 MED ORDER — LACTATED RINGERS IV SOLN
INTRAVENOUS | Status: DC
Start: ? — End: 2018-06-24

## 2018-06-24 MED ORDER — BISACODYL 5 MG PO TBEC
10.00 | DELAYED_RELEASE_TABLET | ORAL | Status: DC
Start: ? — End: 2018-06-24

## 2018-06-24 MED ORDER — DEXAMETHASONE SODIUM PHOSPHATE 4 MG/ML IJ SOLN
4.00 | INTRAMUSCULAR | Status: DC
Start: 2018-06-25 — End: 2018-06-24

## 2018-06-24 MED ORDER — CARBOXYMETHYLCELLULOSE SOD PF 0.5 % OP SOLN
1.00 | OPHTHALMIC | Status: DC
Start: ? — End: 2018-06-24

## 2018-06-25 ENCOUNTER — Ambulatory Visit: Payer: Self-pay | Admitting: Family Medicine

## 2018-06-26 MED ORDER — METHYLPREDNISOLONE 4 MG PO TABS
4.00 | ORAL_TABLET | ORAL | Status: DC
Start: 2018-06-27 — End: 2018-06-26

## 2018-06-26 MED ORDER — METHYLPREDNISOLONE 4 MG PO TABS
8.00 | ORAL_TABLET | ORAL | Status: DC
Start: 2018-06-26 — End: 2018-06-26

## 2018-06-26 MED ORDER — METHYLPREDNISOLONE 4 MG PO TABS
4.00 | ORAL_TABLET | ORAL | Status: DC
Start: 2018-06-26 — End: 2018-06-26

## 2018-06-26 MED ORDER — METHYLPREDNISOLONE 4 MG PO TABS
4.00 | ORAL_TABLET | ORAL | Status: DC
Start: 2018-07-01 — End: 2018-06-26

## 2018-06-26 MED ORDER — PANTOPRAZOLE SODIUM 40 MG PO TBEC
40.00 | DELAYED_RELEASE_TABLET | ORAL | Status: DC
Start: 2018-07-01 — End: 2018-06-26

## 2018-06-26 MED ORDER — METHYLPREDNISOLONE 4 MG PO TABS
8.00 | ORAL_TABLET | ORAL | Status: DC
Start: 2018-06-27 — End: 2018-06-26

## 2018-06-26 MED ORDER — METHYLPREDNISOLONE 4 MG PO TABS
4.00 | ORAL_TABLET | ORAL | Status: DC
Start: 2018-06-30 — End: 2018-06-26

## 2018-06-30 MED ORDER — ASPIRIN EC 81 MG PO TBEC
81.00 | DELAYED_RELEASE_TABLET | ORAL | Status: DC
Start: 2018-07-01 — End: 2018-06-30

## 2018-06-30 MED ORDER — BISACODYL 10 MG RE SUPP
10.00 | RECTAL | Status: DC
Start: 2018-07-02 — End: 2018-06-30

## 2018-06-30 MED ORDER — GLIPIZIDE 10 MG PO TABS
10.00 | ORAL_TABLET | ORAL | Status: DC
Start: 2018-06-30 — End: 2018-06-30

## 2018-06-30 MED ORDER — INSULIN REGULAR HUMAN 100 UNIT/ML IJ SOLN
.00 | INTRAMUSCULAR | Status: DC
Start: 2018-06-30 — End: 2018-06-30

## 2018-06-30 MED ORDER — GENERIC EXTERNAL MEDICATION
1.00 | Status: DC
Start: ? — End: 2018-06-30

## 2018-06-30 MED ORDER — DEXTROSE 50 % IV SOLN
12.50 | INTRAVENOUS | Status: DC
Start: ? — End: 2018-06-30

## 2018-09-16 ENCOUNTER — Ambulatory Visit: Payer: Self-pay | Admitting: Neurology

## 2018-09-19 ENCOUNTER — Telehealth: Payer: Self-pay

## 2018-09-19 NOTE — Telephone Encounter (Signed)
Noted  

## 2018-09-19 NOTE — Telephone Encounter (Signed)
Spoke with patient today to follow up on notification that he had to cancel his neurology appointment that was originally ordered in July 2019 for weakness. Patient had back surgery in October 2019 and is in [place] through therapy and working on been able to walk. He is not sure how long he will be there. I advised patient will close this referral unless he wants to proceed in the future. Also asked patient to have rehab facility send [place] notes to update records and be able to see what he went through so far. Per patient he had blood clot after the surgery and has been getting frequent labs drawn. He will work on getting those notes to [place]. Advised patient that I will send note to Dr. Diona Browner as an Juluis Rainier.

## 2018-12-06 ENCOUNTER — Telehealth: Payer: Self-pay | Admitting: Family Medicine

## 2018-12-06 NOTE — Telephone Encounter (Signed)
Patient was released from [facility] [place].  He was in for 5 months.  He had emergency spinal surgery and is in a wheelchair.  Patient said he'd like to follow up with Dr.Bedsole and discuss all the medications he's taking and he's having bowel problems.  Patient does have video capability. Should he do a Webex appointment or come in to the office?

## 2018-12-06 NOTE — Telephone Encounter (Signed)
Start  As webex and we can transition if needed.

## 2018-12-09 NOTE — Telephone Encounter (Signed)
Tried calling pt  Answering machine was to a business

## 2018-12-09 NOTE — Telephone Encounter (Signed)
Pt schedule 3/31 web ex

## 2018-12-10 ENCOUNTER — Other Ambulatory Visit: Payer: Self-pay

## 2018-12-10 ENCOUNTER — Encounter: Payer: Self-pay | Admitting: Family Medicine

## 2018-12-10 ENCOUNTER — Ambulatory Visit (INDEPENDENT_AMBULATORY_CARE_PROVIDER_SITE_OTHER): Payer: Self-pay | Admitting: Family Medicine

## 2018-12-10 ENCOUNTER — Telehealth: Payer: Self-pay | Admitting: Family Medicine

## 2018-12-10 VITALS — HR 84 | Ht 72.0 in | Wt 325.0 lb

## 2018-12-10 DIAGNOSIS — I82431 Acute embolism and thrombosis of right popliteal vein: Secondary | ICD-10-CM

## 2018-12-10 DIAGNOSIS — K5900 Constipation, unspecified: Secondary | ICD-10-CM

## 2018-12-10 DIAGNOSIS — E118 Type 2 diabetes mellitus with unspecified complications: Secondary | ICD-10-CM

## 2018-12-10 DIAGNOSIS — K5909 Other constipation: Secondary | ICD-10-CM | POA: Insufficient documentation

## 2018-12-10 DIAGNOSIS — G834 Cauda equina syndrome: Secondary | ICD-10-CM | POA: Insufficient documentation

## 2018-12-10 DIAGNOSIS — E1169 Type 2 diabetes mellitus with other specified complication: Secondary | ICD-10-CM

## 2018-12-10 DIAGNOSIS — I10 Essential (primary) hypertension: Secondary | ICD-10-CM

## 2018-12-10 DIAGNOSIS — IMO0002 Reserved for concepts with insufficient information to code with codable children: Secondary | ICD-10-CM

## 2018-12-10 DIAGNOSIS — M545 Low back pain, unspecified: Secondary | ICD-10-CM

## 2018-12-10 DIAGNOSIS — G8929 Other chronic pain: Secondary | ICD-10-CM

## 2018-12-10 DIAGNOSIS — I82409 Acute embolism and thrombosis of unspecified deep veins of unspecified lower extremity: Secondary | ICD-10-CM | POA: Insufficient documentation

## 2018-12-10 DIAGNOSIS — E1165 Type 2 diabetes mellitus with hyperglycemia: Secondary | ICD-10-CM

## 2018-12-10 DIAGNOSIS — E1143 Type 2 diabetes mellitus with diabetic autonomic (poly)neuropathy: Secondary | ICD-10-CM

## 2018-12-10 DIAGNOSIS — E785 Hyperlipidemia, unspecified: Secondary | ICD-10-CM

## 2018-12-10 NOTE — Assessment & Plan Note (Signed)
Unclear control. Continue on ER 500 mg daily and increase glipizde to BID as supposed to be taking.  Pt will come in for curbside A1C.  He is unable check CBGs as does not have a meter.

## 2018-12-10 NOTE — Telephone Encounter (Signed)
Robin.. I am sorry.. his Rn visit and lab visit need to be changed to a day I am in office... please change to 12/12/18 and add to RN visit schedule notes: See Butch Penny.

## 2018-12-10 NOTE — Assessment & Plan Note (Signed)
In wheel chair, minimal ambulation despite 5 months of PT.  Trial back on flomax to see if helps with urine flow.

## 2018-12-10 NOTE — Assessment & Plan Note (Signed)
Muscle relaxant prn.

## 2018-12-10 NOTE — Patient Instructions (Addendum)
Continue HCTZ 25 mg daily.  Get BP cuff if able.. follow BP at home.. gaol < 140/90. We will call you to set up curbside labs and BP check.  Increase water and fiber intake.  Add milk of magnesia till BM.  Take metformin 500 mg ER daily as well as glipizide 10 mg twice daily.  Keep appt for DVT follow up with hematology on 4/3/202 to determine plans for coumadin or plavix.  Can do a trial of flomax for 3-4 days to see if it helps your urine flow. My CMA will call to set up labs, BP check curbside as well as a 2 week follow up [facility] visit.

## 2018-12-10 NOTE — Assessment & Plan Note (Signed)
Trail of MCT oil per pt request. Increase water and fiber.  If not resolved add MOM to miralax regimen.  If still not resolved.. rectal disimpaction and enema.

## 2018-12-10 NOTE — Assessment & Plan Note (Signed)
Stay on HCTZ 25 mg for now. Check BP at curbside and get labs.  Hold lisinopril for now.

## 2018-12-10 NOTE — Assessment & Plan Note (Signed)
Continue current dose of gabapentin.

## 2018-12-10 NOTE — Progress Notes (Signed)
Virtual Visit  I connected with Jamie Finley on 12/10/18 at  8:30 AM EDT by [facility] and verified that I am speaking with the correct person using two identifiers.   I discussed the limitations, risks, security and privacy concerns of performing an evaluation and management service by [facility] and the availability of in person appointments. I also discussed with the patient that there may be a patient responsible charge related to this service. The patient expressed understanding and agreed to proceed.  Patient location: Home Provider Location: [place] Participants: Amy Diona Browner and Aarib Pulido   History of Present Illness:  58 year old male presents for hospital and rehab follow up.  06/2018 had L2-L3 open diskectomy for cauda equina syndrome  Dr. Daun Pe[facility]ock [facility]  Following this he had some persistent bilateral leg weakness and has recently been discharged from  [facility] rehab.  He is currently in a wheelchair. He able to ambulate with  A walker but is holding weight up with arms, not true ambulation.  Discharged from rehab on 11/14/2018.Marland Kitchen   On gabapentin for neuropathic pain.  Some issues having to strain to get urine out, but is able to empty completely. No longer taking flomax  He notes he is having issues with his bowels. He is constipated. BMs occurring 3-4 days. On senekot and polyetyhylene glycol. Drinking prune juice and prunes. Last decent BM 7-8 days ago  Minimal water intake, minimal fiber. No blood in stool.   Diabetes:  Was well controlled in hospital. Due for re-eval. On metformin (currently taking 1000 AM and 500 ER in PM), glipizide  FBS 170-180 at rehab Lab Results  Component Value Date   HGBA1C 6.8 (H) 06/20/2018  Using medications without difficulties: Hypoglycemic episodes:? Hyperglycemic episodes:? Feet problems: peripheral neuropathy.. dry  Blood Sugars averaging: not checking at home. eye exam within last year:  Hypertension:    At  last OV in 06/2018 BP was not controlled 149/98 on HCTZ, lisinopril but pt was in pain... he reports he is no longer taking lisinopril.   At rehab BP was well controlleld on HCTZ alone.  Using medication without problems or lightheadedness: none Chest pain with exertion:none Edema:occ in right foot.. chronic, occurred in rehab as well... Had small DVT in right popliteal... took coumadin for 5 month now. Resolved on [place] 08/07/2018  Pt not sure to take plavix of coumadin... he stopped coumadin 1 month  Ago when lost prescription. Short of breath:none Average home BPs: Other issues:   HX of CVA.Marland Kitchen was on plavix and ASA.. now just on ASA  Overdue for last cholesterol check.  On crestor   Has follow up with neurosurgery on 12/12/2018 and with hematology on 12/13/2018  COVID 19 screen The patient denies symptoms of COVID 19 at this time.  The importance of social distancing was discussed today.   ROS    Past Medical History:  Diagnosis Date  . Arthritis   . Depression   . Diabetes mellitus without complication ([place])   . Frequent headaches   . Gout   . Hyperlipidemia   . Hypertension   . Stroke [facility])     reports that he quit smoking about 29 years ago. He has never used smokeless tobacco. He reports current alcohol use. He reports that he does not use drugs.   Current Outpatient Medications:  .  aspirin 81 MG tablet, Take 81 mg by mouth daily., Disp: , Rfl:  .  clopidogrel (PLAVIX) 75 MG tablet, Take 1 tablet (75  mg total) by mouth daily., Disp: 90 tablet, Rfl: 3 .  glipiZIDE (GLUCOTROL) 10 MG tablet, Take 1 tablet (10 mg total) by mouth 2 (two) times daily before a meal., Disp: 180 tablet, Rfl: 3 .  hydrochlorothiazide (MICROZIDE) 12.5 MG capsule, TAKE ONE TABLET BY MOUTH EVERY DAY, Disp: 90 capsule, Rfl: 3 .  lisinopril (PRINIVIL,ZESTRIL) 10 MG tablet, Take 1 tablet (10 mg total) by mouth daily., Disp: 90 tablet, Rfl: 3 .  metFORMIN (GLUCOPHAGE) 1000 MG tablet, Take 1 tablet (1,000 mg  total) by mouth 2 (two) times daily with a meal., Disp: 180 tablet, Rfl: 3 .  polyethylene glycol (MIRALAX / GLYCOLAX) packet, Take 17 g by mouth daily., Disp: 14 each, Rfl: 0 .  rosuvastatin (CRESTOR) 20 MG tablet, Take 1 tablet (20 mg total) by mouth at bedtime., Disp: 90 tablet, Rfl: 3 .  senna-docusate (SENOKOT-S) 8.6-50 MG tablet, Take 1 tablet by mouth at bedtime as needed for mild constipation., Disp: , Rfl:  .  tamsulosin (FLOMAX) 0.4 MG CAPS capsule, Take 1 capsule (0.4 mg total) by mouth daily., Disp: 30 capsule, Rfl:    Observations/Objective: There were no vitals taken for this visit.  Physical Exam Constitutional:      General: He is not in acute distress.    Appearance: He is obese.  Pulmonary:     Effort: Pulmonary effort is normal.  Neurological:     Mental Status: He is alert and oriented to person, place, and time.     Comments: In wheelchair      Assessment and Plan  See problem based charting.   I discussed the assessment and treatment plan with the patient. The patient was provided an opportunity to ask questions and all were answered. The patient agreed with the plan and demonstrated an understanding of the instructions.   The patient was advised to call back or seek an in-person evaluation if the symptoms worsen or if the condition fails to improve as anticipated.  Extensive med reviewed undertaken given multiple meds and multiple conflicting instructions.  I provided 40 minutes of face-to-face time during this encounter.   [name], MD

## 2018-12-10 NOTE — Assessment & Plan Note (Signed)
Due for re-eval on crestor 

## 2018-12-10 NOTE — Assessment & Plan Note (Signed)
Completed 5 months of warfarin, stopped at D/C given lost prescription. HAs been off for 1 month.  Keep appt with heme in 3 days for longterm plan.  Given known case for 1st DVT and resolution of DVT on [place].. likely has no further need for coumadin. Likely will be able to return to plavix and ASA for CVA prevention. Will await HEME recommendations.

## 2018-12-10 NOTE — Telephone Encounter (Signed)
Spoke to pt to schedule his nurse visit and labs  Appointment 4/1.  He has an appointment @ [facility] Friday with  dr Markus Daft pm  and wanted to know if you could add  To see if he needs to stay on comudian or go off

## 2018-12-11 ENCOUNTER — Other Ambulatory Visit: Payer: Self-pay

## 2018-12-11 ENCOUNTER — Ambulatory Visit: Payer: Self-pay

## 2018-12-11 NOTE — Telephone Encounter (Signed)
Lab and Nurse Visit moved to 12/12/2018 at 8:45 & 9:00 am.

## 2018-12-12 ENCOUNTER — Encounter: Payer: Self-pay | Admitting: *Deleted

## 2018-12-12 ENCOUNTER — Ambulatory Visit: Payer: Self-pay | Admitting: Family Medicine

## 2018-12-12 ENCOUNTER — Other Ambulatory Visit (INDEPENDENT_AMBULATORY_CARE_PROVIDER_SITE_OTHER): Payer: Self-pay

## 2018-12-12 ENCOUNTER — Ambulatory Visit: Payer: Self-pay

## 2018-12-12 ENCOUNTER — Other Ambulatory Visit: Payer: Self-pay

## 2018-12-12 DIAGNOSIS — E1169 Type 2 diabetes mellitus with other specified complication: Secondary | ICD-10-CM

## 2018-12-12 DIAGNOSIS — E118 Type 2 diabetes mellitus with unspecified complications: Secondary | ICD-10-CM

## 2018-12-12 DIAGNOSIS — IMO0002 Reserved for concepts with insufficient information to code with codable children: Secondary | ICD-10-CM

## 2018-12-12 DIAGNOSIS — E1165 Type 2 diabetes mellitus with hyperglycemia: Secondary | ICD-10-CM

## 2018-12-12 DIAGNOSIS — E785 Hyperlipidemia, unspecified: Secondary | ICD-10-CM

## 2018-12-12 LAB — COMPREHENSIVE METABOLIC PANEL
ALT: 17 U/L (ref 0–53)
AST: 20 U/L (ref 0–37)
Albumin: 4 g/dL (ref 3.5–5.2)
Alkaline Phosphatase: 68 U/L (ref 39–117)
BUN: 13 mg/dL (ref 6–23)
CO2: 31 mEq/L (ref 19–32)
Calcium: 9.3 mg/dL (ref 8.4–10.5)
Chloride: 101 mEq/L (ref 96–112)
Creatinine, Ser: 0.89 mg/dL (ref 0.40–1.50)
GFR: 87.76 mL/min (ref >60.00)
Glucose, Bld: 120 mg/dL — ABNORMAL HIGH (ref 70–99)
Potassium: 3.7 mEq/L (ref 3.5–5.1)
Sodium: 140 mEq/L (ref 135–145)
Total Bilirubin: 0.7 mg/dL (ref 0.2–1.2)
Total Protein: 7.2 g/dL (ref 6.0–8.3)

## 2018-12-12 LAB — LIPID PANEL
Cholesterol: 116 mg/dL (ref 0–200)
HDL: 40.3 mg/dL (ref >39.00)
LDL Cholesterol: 50 mg/dL (ref 0–99)
NonHDL: 75.92
Total CHOL/HDL Ratio: 3
Triglycerides: 128 mg/dL (ref 0.0–149.0)
VLDL: 25.6 mg/dL (ref 0.0–40.0)

## 2018-12-12 LAB — HEMOGLOBIN A1C: Hgb A1c MFr Bld: 8.6 % — ABNORMAL HIGH (ref 4.6–6.5)

## 2018-12-12 MED ORDER — BLOOD GLUCOSE METER KIT: PACK | 0 refills | Status: AC

## 2018-12-12 NOTE — Addendum Note (Signed)
Addended by: Carter Kitten on: 12/12/2018 02:53 PM   Modules accepted: Orders

## 2018-12-24 ENCOUNTER — Ambulatory Visit (INDEPENDENT_AMBULATORY_CARE_PROVIDER_SITE_OTHER): Payer: Self-pay | Admitting: Family Medicine

## 2018-12-24 ENCOUNTER — Encounter: Payer: Self-pay | Admitting: Family Medicine

## 2018-12-24 VITALS — HR 67

## 2018-12-24 DIAGNOSIS — Z8673 Personal history of transient ischemic attack (TIA), and cerebral infarction without residual deficits: Secondary | ICD-10-CM

## 2018-12-24 DIAGNOSIS — E1165 Type 2 diabetes mellitus with hyperglycemia: Secondary | ICD-10-CM

## 2018-12-24 DIAGNOSIS — E1143 Type 2 diabetes mellitus with diabetic autonomic (poly)neuropathy: Secondary | ICD-10-CM

## 2018-12-24 DIAGNOSIS — E785 Hyperlipidemia, unspecified: Secondary | ICD-10-CM

## 2018-12-24 DIAGNOSIS — E118 Type 2 diabetes mellitus with unspecified complications: Secondary | ICD-10-CM

## 2018-12-24 DIAGNOSIS — E1169 Type 2 diabetes mellitus with other specified complication: Secondary | ICD-10-CM

## 2018-12-24 DIAGNOSIS — G834 Cauda equina syndrome: Secondary | ICD-10-CM

## 2018-12-24 DIAGNOSIS — IMO0002 Reserved for concepts with insufficient information to code with codable children: Secondary | ICD-10-CM

## 2018-12-24 DIAGNOSIS — M1A0710 Idiopathic chronic gout, right ankle and foot, without tophus (tophi): Secondary | ICD-10-CM

## 2018-12-24 DIAGNOSIS — I82431 Acute embolism and thrombosis of right popliteal vein: Secondary | ICD-10-CM

## 2018-12-24 DIAGNOSIS — Z86718 Personal history of other venous thrombosis and embolism: Secondary | ICD-10-CM | POA: Insufficient documentation

## 2018-12-24 DIAGNOSIS — K5900 Constipation, unspecified: Secondary | ICD-10-CM

## 2018-12-24 DIAGNOSIS — I10 Essential (primary) hypertension: Secondary | ICD-10-CM

## 2018-12-24 MED ORDER — METFORMIN HCL ER 500 MG PO TB24: 1000.0000 mg | ORAL_TABLET | Freq: Every day | ORAL | 11 refills | Status: DC

## 2018-12-24 NOTE — Assessment & Plan Note (Signed)
?   Possible source of sharp pain intermittantly in right calf? On gabapentin high dose.

## 2018-12-24 NOTE — Patient Instructions (Addendum)
Take colchicine for 2 more days. Elevate right foot when able.  Stop glipizide and increase metformin ER to 2 tabs of 500 mg daily.  If can get BP cuff and glucometer to check BP and sugar at home. Send  MyChart message with measurements in 1-2 weeks.  Can try cetaphil cream, not lotion.  Get the labs done by hematology... if not told to go back on coumadin.Marland Kitchen we will restart plavix. Continue aspirin daily.  Keep up the great work on diet.

## 2018-12-24 NOTE — Assessment & Plan Note (Signed)
Stable urinary function.

## 2018-12-24 NOTE — Assessment & Plan Note (Addendum)
A1C poor but fast glucose at 120 suggesting improving control. Given constipation.. will stop glipizde and increase metfomrin to  1000 mg ER daily to gain possible SE of diarrhea.  Pt will get glucometer to follow blood sugars at home.

## 2018-12-24 NOTE — Assessment & Plan Note (Addendum)
Pain in right foot improved with colchicine at least in great toe.. ? If swelling and redness also due to gout.  Conitnue colchicine daily for 2 more days.Marland Kitchen if not improving pt knows to call for  appt for further eval and [place].

## 2018-12-24 NOTE — Assessment & Plan Note (Signed)
Great control on crestor. LDL at goal < 70

## 2018-12-24 NOTE — Assessment & Plan Note (Signed)
UNclear control.. pt will either get cuff or if unable to afford.. get curbside BP checked.

## 2018-12-24 NOTE — Assessment & Plan Note (Signed)
Intermittantly improved with suppositiry.

## 2018-12-24 NOTE — Assessment & Plan Note (Signed)
No longer on coumadin. Resolved at follow up US. Per heme.. labs pending to determine if longer need for coumadin, but pt with clear decreaed mobility as cause.  Now with new right sided pain... possible recurrent DVT? Given mild pain , intermittent and mild swelling not clearly typical of DVT... if not continuing to improve with treatment of gout in next few days... will refer for [place] right leg.

## 2018-12-24 NOTE — Assessment & Plan Note (Signed)
If not restarted on coumadin.. will add back plavix to aspirin for secondary prevention.

## 2018-12-24 NOTE — Progress Notes (Signed)
VIRTUAL VISIT Due to national recommendations of social distancing due to [place] 19, a virtual visit is felt to be most appropriate for this patient at this time.   I connected with the patient on 12/24/18 at 11:15 AM EDT by virtual telehealth platform and verified that I am speaking with the correct person using two identifiers.   I discussed the limitations, risks, security and privacy concerns of performing an evaluation and management service by  virtual telehealth platform and the availability of in person appointments. I also discussed with the patient that there may be a patient responsible charge related to this service. The patient expressed understanding and agreed to proceed.  Patient location: Home Provider Location: [facility] Participants: [name] and [name]   Chief Complaint  Patient presents with  . Follow-up    BP & Lab results-Never need Curbside BP check due to did not have a ride    History of Present Illness: 58 year old male presents for a follow up on blood pressure, DM neuropathic pain and constipation..   Summary of recent medical history: 06/2018 had L2-L3 open diskectomy for cauda equina syndrome  Dr. [facility]  Following this he had some persistent bilateral leg weakness and has recently been discharged from  [facility] rehab.  He is currently in a wheelchair. He able to ambulate with  A walker but is holding weight up with arms, not true ambulation.  Discharged from rehab on 11/14/2018.  1.Constipation: Still having decreased BMs in last few weeks. Suppositories have helped more.  2.Neuropathic pain: Stable control on gabapentin 800 mg TID.  3. Hypertension: Was not able to come for BP check.Not able to check at home. Using medication without problems or lightheadedness: none Chest pain with exertion:none Edema:none Short of breath:none Average home BPs: no money for cuff Other issues:  4.Diabetes:  At last OV plan met  500 daily and glipizide BID Lab Results  Component Value Date   HGBA1C 8.6 (H) 12/12/2018  Using medications without difficulties: Hypoglycemic episodes:? Hyperglycemic episodes:? Feet problems: see below Blood Sugars averaging: not able to check FBS He has been working on [employer], very limited sweets. Increased water. eye exam within last year:  5. DVT: Followed up with hematology..  Has labs pending...  Likely consider done coumadin given finished 5 months  Not restarted on plavix (hx of CVA).  6. Elevated Cholesterol: At goal < 70 on crestor Using medications without problems: Muscle aches:  Diet compliance: great Exercise: minimal Other complaints:  7. Very scaly skin on legs  8.  Slight flare up in right great toe..  Took colchicine.. helped big toe pain  Sllight swelling in right foot still there. Mildly pain, slightly red on top of foot Has tried to elevated.. may be down a lot  hx of gout No fever   Does have some momentary sharp pain in calf in last few days.. tight in skin on leg. No constant pain.Marland Kitchen no pain with squeezing calf.   COVID 19 screen No recent travel or known exposure to COVID19 The patient denies respiratory symptoms of COVID 19 at this time.  The importance of social distancing was discussed today.   Social History /Family History/Past Medical History reviewed in detail and updated in EMR if needed. Pulse 67.  Review of Systems  Constitutional: Negative for chills and fever.  HENT: Negative for congestion and ear pain.   Eyes: Negative for pain and redness.  Respiratory: Negative for cough and shortness  of breath.   Cardiovascular: Positive for leg swelling. Negative for chest pain and palpitations.  Gastrointestinal: Negative for abdominal pain, blood in stool, constipation, diarrhea, nausea and vomiting.  Genitourinary: Negative for dysuria.  Musculoskeletal: Negative for falls and myalgias.  Skin: Negative for rash.  Neurological:  Negative for dizziness.  Psychiatric/Behavioral: Negative for depression. The patient is not nervous/anxious.       Past Medical History:  Diagnosis Date  . Arthritis   . Depression   . Diabetes mellitus without complication ([place])   . Frequent headaches   . Gout   . Hyperlipidemia   . Hypertension   . Stroke [facility])     reports that he quit smoking about 29 years ago. He has never used smokeless tobacco. He reports current alcohol use. He reports that he does not use drugs.   Current Outpatient Medications:  .  acetaminophen (TYLENOL) 325 MG tablet, Take 325 mg by mouth every 4 (four) hours as needed. , Disp: , Rfl:  .  aspirin 81 MG tablet, Take 81 mg by mouth daily., Disp: , Rfl:  .  blood glucose meter kit and supplies, Dispense based on patient and insurance preference. Use to check blood sugar two times a day., Disp: 1 each, Rfl: 0 .  colchicine 0.6 MG tablet, Take 0.6 mg by mouth daily. , Disp: , Rfl:  .  diclofenac sodium (VOLTAREN) 1 % GEL, Apply topically., Disp: , Rfl:  .  gabapentin (NEURONTIN) 400 MG capsule, Take 800 mg by mouth 3 (three) times daily., Disp: , Rfl:  .  glipiZIDE (GLUCOTROL) 10 MG tablet, Take 1 tablet (10 mg total) by mouth 2 (two) times daily before a meal., Disp: 180 tablet, Rfl: 3 .  hydrochlorothiazide (HYDRODIURIL) 25 MG tablet, Take 25 mg by mouth daily., Disp: , Rfl:  .  hydrocortisone cream 1 %, Apply topically., Disp: , Rfl:  .  metFORMIN (GLUCOPHAGE-XR) 500 MG 24 hr tablet, Take 500 mg by mouth daily with breakfast., Disp: , Rfl:  .  methocarbamol (ROBAXIN) 750 MG tablet, Take 750 mg by mouth daily as needed for muscle spasms., Disp: , Rfl:  .  mineral oil enema, Place rectally., Disp: , Rfl:  .  nystatin cream (MYCOSTATIN), Apply topically., Disp: , Rfl:  .  polyethylene glycol (MIRALAX / GLYCOLAX) packet, Take 17 g by mouth daily., Disp: 14 each, Rfl: 0 .  rosuvastatin (CRESTOR) 20 MG tablet, Take 1 tablet (20 mg total) by mouth at bedtime.,  Disp: 90 tablet, Rfl: 3 .  tamsulosin (FLOMAX) 0.4 MG CAPS capsule, Take 1 capsule (0.4 mg total) by mouth daily. (Patient not taking: Reported on 12/10/2018), Disp: 30 capsule, Rfl:    Observations/Objective: Pulse 67.  Physical Exam  Physical Exam Constitutional:      General: She is not in acute distress. Pulmonary:     Effort: Pulmonary effort is normal. No respiratory distress.  Neurological:     Mental Status: She is alert and oriented to person, place, and time.  Psychiatric:        Mood and Affect: Mood normal.        Behavior: Behavior normal.  Right extremity: diffuse swelling in foot and ankle Mild, slight erythema on dorsal foot laterally, Per pt nontender in calf to squeeze, dry scaly skin on right calf and foot  Assessment and Plan Diabetes mellitus type 2, uncontrolled, with complications (HCC) T0V poor but fast glucose at 120 suggesting improving control. Given constipation.. will stop glipizde and increase metfomrin to  1000 mg ER daily to gain possible SE of diarrhea.  Pt will get glucometer to follow blood sugars at home.  Hypertension UNclear control.. pt will either get cuff or if unable to afford.. get curbside BP checked.  Peripheral autonomic neuropathy due to diabetes mellitus ([place]) ? Possible source of sharp pain intermittantly in right calf? On gabapentin high dose.  Hyperlipidemia associated with type 2 diabetes mellitus ([place]) Great control on crestor. LDL at goal < 70  History of CVA (cerebrovascular accident) If not restarted on coumadin.. will add back plavix to aspirin for secondary prevention.  Cauda equina syndrome with neurogenic bladder (HCC) Stable urinary function.  Constipation Intermittantly improved with suppositiry.  Acute deep vein thrombosis (DVT) of right popliteal vein (HCC) No longer on coumadin. Resolved at follow up US. Per heme.. labs pending to determine if longer need for coumadin, but pt with clear decreaed mobility as  cause.  Now with new right sided pain... possible recurrent DVT? Given mild pain , intermittent and mild swelling not clearly typical of DVT... if not continuing to improve with treatment of gout in next few days... will refer for [place] right leg.  Gout of right foot Pain in right foot improved with colchicine at least in great toe.. ? If swelling and redness also due to gout.  Conitnue colchicine daily for 2 more days.Marland Kitchen if not improving pt knows to call for  appt for further eval and [place].     I discussed the assessment and treatment plan with the patient. The patient was provided an opportunity to ask questions and all were answered. The patient agreed with the plan and demonstrated an understanding of the instructions.   The patient was advised to call back or seek an in-person evaluation if the symptoms worsen or if the condition fails to improve as anticipated.     [name], MD

## 2019-01-22 ENCOUNTER — Telehealth: Payer: Self-pay | Admitting: Pharmacy Technician

## 2019-01-22 NOTE — Telephone Encounter (Signed)
Patient has been denied for our program due to no returning certification packet and proof of income. A denial letter was mailed (01/22/19). If the patient still needs our assistance, they need to provide proof of income and complete the certification process.  Velda Shell CPhT/Recertification Specialist [facility]

## 2019-02-11 ENCOUNTER — Encounter: Payer: Self-pay | Admitting: Emergency Medicine

## 2019-02-11 ENCOUNTER — Other Ambulatory Visit: Payer: Self-pay

## 2019-02-11 ENCOUNTER — Emergency Department: Payer: Medicaid Other

## 2019-02-11 ENCOUNTER — Emergency Department
Admission: EM | Admit: 2019-02-11 | Discharge: 2019-02-12 | Disposition: A | Discharge status: Home / Self Care | Payer: Medicaid Other | Attending: Emergency Medicine | Admitting: Emergency Medicine

## 2019-02-11 DIAGNOSIS — Z7982 Long term (current) use of aspirin: Secondary | ICD-10-CM | POA: Insufficient documentation

## 2019-02-11 DIAGNOSIS — Z87891 Personal history of nicotine dependence: Secondary | ICD-10-CM | POA: Diagnosis not present

## 2019-02-11 DIAGNOSIS — Z79899 Other long term (current) drug therapy: Secondary | ICD-10-CM | POA: Insufficient documentation

## 2019-02-11 DIAGNOSIS — I10 Essential (primary) hypertension: Secondary | ICD-10-CM | POA: Diagnosis not present

## 2019-02-11 DIAGNOSIS — Z8673 Personal history of transient ischemic attack (TIA), and cerebral infarction without residual deficits: Secondary | ICD-10-CM | POA: Diagnosis not present

## 2019-02-11 DIAGNOSIS — R030 Elevated blood-pressure reading, without diagnosis of hypertension: Secondary | ICD-10-CM

## 2019-02-11 DIAGNOSIS — Z7984 Long term (current) use of oral hypoglycemic drugs: Secondary | ICD-10-CM | POA: Insufficient documentation

## 2019-02-11 DIAGNOSIS — E119 Type 2 diabetes mellitus without complications: Secondary | ICD-10-CM | POA: Diagnosis not present

## 2019-02-11 LAB — CBC WITH DIFFERENTIAL/PLATELET
Abs Immature Granulocytes: 0.03 10*3/uL (ref 0.00–0.07)
Basophils Absolute: 0.1 10*3/uL (ref 0.0–0.1)
Basophils Relative: 1 %
Eosinophils Absolute: 0.2 10*3/uL (ref 0.0–0.5)
Eosinophils Relative: 2 %
HCT: 37.7 % — ABNORMAL LOW (ref 39.0–52.0)
Hemoglobin: 12.4 g/dL — ABNORMAL LOW (ref 13.0–17.0)
Immature Granulocytes: 0 %
Lymphocytes Relative: 29 %
Lymphs Abs: 3.2 10*3/uL (ref 0.7–4.0)
MCH: 27.5 pg (ref 26.0–34.0)
MCHC: 32.9 g/dL (ref 30.0–36.0)
MCV: 83.6 fL (ref 80.0–100.0)
Monocytes Absolute: 0.9 10*3/uL (ref 0.1–1.0)
Monocytes Relative: 9 %
Neutro Abs: 6.5 10*3/uL (ref 1.7–7.7)
Neutrophils Relative %: 59 %
Platelets: 288 10*3/uL (ref 150–400)
RBC: 4.51 MIL/uL (ref 4.22–5.81)
RDW: 14.6 % (ref 11.5–15.5)
WBC: 10.8 10*3/uL — ABNORMAL HIGH (ref 4.0–10.5)
nRBC: 0 % (ref 0.0–0.2)

## 2019-02-11 NOTE — ED Triage Notes (Signed)
Pt to triage via w/c with no distress noted, mask in place; pt reports SHOB this evening; denies any accomp symptoms; pt reports he was concerned because he has had a previous stroke but again denies any accomp symptoms

## 2019-02-11 NOTE — ED Provider Notes (Signed)
[facility] Emergency Department Provider Note  ____________________________________________  Time seen: Approximately 11:52 PM  I have reviewed the triage vital signs and the nursing notes.   HISTORY  Chief Complaint Shortness of Breath   HPI Jamie Finley is a 58 y.o. male with a history of stroke on aspirin, cauda equina status post surgical repair in October 4765 complicated by DVT, no longer on anticoagulation, hypertension, hyperlipidemia, diabetes who presents for evaluation of elevated blood pressure.  Patient was in his usual state of health until this evening.  He was sitting on the computer when he reports feeling very hot and breaking out in a sweat involving just his head.  He decided to check his blood pressure which was elevated with systolics in the 465K.  Because of a prior stroke patient was concerned and came to the emergency room for evaluation.  He reports that after looking at his blood pressure he felt very anxious and had a very brief episode of shortness of breath which resolved when he was able to calm himself down.  He denies headache, chest pain, shortness of breath currently, unilateral weakness or numbness, dizziness, fever, slurred speech, facial droop, cough.  Patient feels back to baseline at this time.  Patient reports that he was taken off of his antihypertensive medications in October during the surgery and has not needed to be placed back on it due to normal blood pressure.  Had an appointment with his PCP a month ago and had normal blood pressure.  Past Medical History:  Diagnosis Date   Arthritis    Depression    Diabetes mellitus without complication ([place])    Frequent headaches    Gout    Hyperlipidemia    Hypertension    Stroke [facility])     Patient Active Problem List   Diagnosis Date Noted   Acute deep vein thrombosis (DVT) of right popliteal vein ([place]) 12/24/2018   DVT (deep venous thrombosis) ([place])  12/10/2018   Cauda equina syndrome with neurogenic bladder ([place]) 12/10/2018   Constipation 12/10/2018   Gout of right foot 09/25/2017   Bilateral knee pain 08/24/2017   Seasonal depression ([place]) 08/24/2017   Chronic low back pain 02/27/2017   History of CVA (cerebrovascular accident) 01/15/2016   Hyperlipidemia associated with type 2 diabetes mellitus ([place]) 02/18/2015   Peripheral autonomic neuropathy due to diabetes mellitus ([place]) 02/18/2015   Diabetes mellitus type 2, uncontrolled, with complications ([place]) 35/46/5681   Hypertension 12/15/2014   Morbid obesity ([place]) 12/15/2014    Past Surgical History:  Procedure Laterality Date   BACK SURGERY  2007   [facility]   TONSILLECTOMY      Prior to Admission medications   Medication Sig Start Date End Date Taking? Authorizing Provider  acetaminophen (TYLENOL) 325 MG tablet Take 325 mg by mouth every 4 (four) hours as needed.     [provider]  aspirin 81 MG tablet Take 81 mg by mouth daily.    Roselee Nova, MD  blood glucose meter kit and supplies Dispense based on patient and insurance preference. Use to check blood sugar two times a day. 12/12/18   Bedsole, Amy E, MD  colchicine 0.6 MG tablet Take 0.6 mg by mouth daily.  08/27/18   [provider]  diclofenac sodium (VOLTAREN) 1 % GEL Apply topically. 08/26/18 08/26/19  [provider]  gabapentin (NEURONTIN) 400 MG capsule Take 800 mg by mouth 3 (three) times daily.    [provider]  hydrochlorothiazide (HYDRODIURIL) 25 MG tablet Take 25 mg by mouth daily.    [provider]  hydrocortisone cream 1 % Apply topically. 08/27/18 08/27/19  [provider]  metFORMIN (GLUCOPHAGE-XR) 500 MG 24 hr tablet Take 2 tablets (1,000 mg total) by mouth daily with breakfast. 12/24/18   Bedsole, Amy E, MD  methocarbamol (ROBAXIN) 750 MG tablet Take 750 mg by mouth daily as needed for muscle spasms.    [provider]  mineral  oil enema Place rectally.    [provider]  nystatin cream (MYCOSTATIN) Apply topically. 08/26/18 08/26/19  [provider]  polyethylene glycol (MIRALAX / GLYCOLAX) packet Take 17 g by mouth daily. 06/22/18   Bettey Costa, MD  rosuvastatin (CRESTOR) 20 MG tablet Take 1 tablet (20 mg total) by mouth at bedtime. 03/19/18   Bedsole, Amy E, MD  tamsulosin (FLOMAX) 0.4 MG CAPS capsule Take 1 capsule (0.4 mg total) by mouth daily. Patient not taking: Reported on 12/10/2018 06/23/18   Bettey Costa, MD    Allergies Patient has no known allergies.  Family History  Problem Relation Age of Onset   Cancer Mother        face and jaw   Cancer Father        kidney   Early death Father    Diabetes Maternal Grandmother    Depression Maternal Grandfather    Diabetes Maternal Grandfather    Hearing loss Maternal Grandfather     Social History Social History   Tobacco Use   Smoking status: Former Smoker    Last attempt to quit: 09/11/1989    Years since quitting: 29.4   Smokeless tobacco: Never Used  Substance Use Topics   Alcohol use: Yes   Drug use: No    Review of Systems  Constitutional: Negative for fever. Eyes: Negative for visual changes. ENT: Negative for sore throat. Neck: No neck pain  Cardiovascular: Negative for chest pain. Respiratory: + shortness of breath. Gastrointestinal: Negative for abdominal pain, vomiting or diarrhea. Genitourinary: Negative for dysuria. Musculoskeletal: Negative for back pain. Skin: Negative for rash. Neurological: Negative for headaches, weakness or numbness. Psych: No SI or HI  ____________________________________________   PHYSICAL EXAM:  VITAL SIGNS: ED Triage Vitals  Enc Vitals Group     BP 02/11/19 2248 (!) 143/79     Pulse Rate 02/11/19 2248 79     Resp 02/11/19 2248 18     Temp 02/11/19 2248 98 F (36.7 C)     Temp Source 02/11/19 2248 Oral     SpO2 02/11/19 2248 97 %     Weight 02/11/19 2248 (!)  305 lb (138.3 kg)     Height 02/11/19 2248 6' (1.829 m)     Head Circumference --      Peak Flow --      Pain Score 02/11/19 2246 0     Pain Loc --      Pain Edu? --      Excl. in [place]? --     Constitutional: Alert and oriented. Well appearing and in no apparent distress. HEENT:      Head: Normocephalic and atraumatic.         Eyes: Conjunctivae are normal. Sclera is non-icteric.       Mouth/Throat: Mucous membranes are moist.       Neck: Supple with no signs of meningismus. Cardiovascular: Regular rate and rhythm. No murmurs, gallops, or rubs. 2+ symmetrical distal pulses are present in all extremities. No JVD. Respiratory: Normal  respiratory effort. Lungs are clear to auscultation bilaterally. No wheezes, crackles, or rhonchi.  Gastrointestinal: Soft, non tender, and non distended with positive bowel sounds. No rebound or guarding. Musculoskeletal: Nontender with normal range of motion in all extremities. No edema, cyanosis, or erythema of extremities. Neurologic: Normal speech and language. Face is symmetric. Moving all extremities.  Intact strength and sensation x4, no pronator drift, no dysmetria Skin: Skin is warm, dry and intact. No rash noted. Psychiatric: Mood and affect are normal. Speech and behavior are normal.  ____________________________________________   LABS (all labs ordered are listed, but only abnormal results are displayed)  Labs Reviewed  CBC WITH DIFFERENTIAL/PLATELET - Abnormal; Notable for the following components:      Result Value   WBC 10.8 (*)    Hemoglobin 12.4 (*)    HCT 37.7 (*)    All other components within normal limits  BASIC METABOLIC PANEL - Abnormal; Notable for the following components:   Glucose, Bld 163 (*)    All other components within normal limits  TROPONIN I   ____________________________________________  EKG  ED ECG REPORT I, Rudene Re, the attending physician, personally viewed and interpreted this ECG.  Normal  sinus rhythm, rate of 76, normal intervals, normal axis, no ST elevations or depressions.  Unchanged from prior ____________________________________________  RADIOLOGY  I have personally reviewed the images performed during this visit and I agree with the Radiologist's read.   Interpretation by Radiologist:  Dg Chest Portable 1 View  Result Date: 02/12/2019 CLINICAL DATA:  Shortness of breath EXAM: PORTABLE CHEST 1 VIEW COMPARISON:  04/22/2018 FINDINGS: The cardiac size is enlarged. There is some mild volume overload without overt pulmonary edema. There is no pneumothorax or large area of consolidation. There is no acute osseous abnormality. IMPRESSION: Cardiomegaly with mild volume overload. Electronically Signed   By: Constance Holster M.D.   On: 02/12/2019 00:26      ____________________________________________   PROCEDURES  Procedure(s) performed: None Procedures Critical Care performed:  None ____________________________________________   INITIAL IMPRESSION / ASSESSMENT AND PLAN / ED COURSE  58 y.o. male with a history of stroke on aspirin, cauda equina status post surgical repair in October 2707 complicated by DVT, no longer on anticoagulation, hypertension, hyperlipidemia, diabetes who presents for evaluation of elevated blood pressure.  Patient with an episode of feeling hot and sweaty on his head only followed by elevated blood pressure.  Feels back to baseline.  Patient's vital signs show BP of 143/79, remainder of his vital signs are within normal limits.  Patient is neurologically intact.  EKG shows no evidence of ischemia or dysrhythmias.  No clinical signs of stroke, no headache.  Possibly an episode of anxiety caused by feeling hot. Will check basic labs to rule out DKA, electrolyte abnormalities, dehydration, AKI, cardiac ischemia.  Will monitor on telemetry.    _________________________ 12:36 AM on 02/12/2019 -----------------------------------------  Labs showing  no electrolyte abnormalities, dehydration, AKI, DKA.  Troponin negative.  Chest x-ray showing cardiomegaly.  No history of CHF.  Patient be referred to cardiology for further evaluation.  He has no shortness of breath, no hypoxia, no increased work of breathing at this time therefore does not meet criteria for inpatient evaluation.  Discussed return precautions for chest pain or shortness of breath.  Recommended close follow-up with PCP.   As part of my medical decision making, I reviewed the following data within the [place] notes reviewed and incorporated, Labs reviewed , EKG interpreted , Old  EKG reviewed, Old chart reviewed, Radiograph reviewed , Notes from prior ED visits and [place] Controlled Substance Database    Pertinent labs & imaging results that were available during my care of the patient were reviewed by me and considered in my medical decision making (see chart for details).    ____________________________________________   FINAL CLINICAL IMPRESSION(S) / ED DIAGNOSES  Final diagnoses:  Transient elevated blood pressure      NEW MEDICATIONS STARTED DURING THIS VISIT:  ED Discharge Orders    None       Note:  This document was prepared using Dragon voice recognition software and may include unintentional dictation errors.    Alfred Levins, [place], MD 02/12/19 [phone]

## 2019-02-12 LAB — BASIC METABOLIC PANEL
Anion gap: 9 (ref 5–15)
BUN: 14 mg/dL (ref 6–20)
CO2: 27 mmol/L (ref 22–32)
Calcium: 9.3 mg/dL (ref 8.9–10.3)
Chloride: 102 mmol/L (ref 98–111)
Creatinine, Ser: 0.85 mg/dL (ref 0.61–1.24)
GFR calc Af Amer: >60 mL/min (ref >60)
GFR calc non Af Amer: >60 mL/min (ref >60)
Glucose, Bld: 163 mg/dL — ABNORMAL HIGH (ref 70–99)
Potassium: 3.8 mmol/L (ref 3.5–5.1)
Sodium: 138 mmol/L (ref 135–145)

## 2019-02-12 LAB — TROPONIN I: Troponin I: <0.03 ng/mL (ref <0.03)

## 2019-02-12 NOTE — Discharge Instructions (Signed)
Your evaluation in the emergency room did not show any lab abnormalities.  Chest x-ray shows enlargement of the heart concerning for possible congestive heart failure.  I am referring you to cardiology for further evaluation.  In the meantime return to the emergency room if you have any chest pain or shortness of breath.  Follow-up with your primary care doctor in 2 days.

## 2019-02-20 ENCOUNTER — Telehealth: Payer: Self-pay | Admitting: Family Medicine

## 2019-02-20 DIAGNOSIS — E1165 Type 2 diabetes mellitus with hyperglycemia: Secondary | ICD-10-CM

## 2019-02-20 DIAGNOSIS — M1A0710 Idiopathic chronic gout, right ankle and foot, without tophus (tophi): Secondary | ICD-10-CM

## 2019-02-20 DIAGNOSIS — E1169 Type 2 diabetes mellitus with other specified complication: Secondary | ICD-10-CM

## 2019-02-20 DIAGNOSIS — IMO0002 Reserved for concepts with insufficient information to code with codable children: Secondary | ICD-10-CM

## 2019-02-20 NOTE — Telephone Encounter (Signed)
-----   Message from Ellamae Sia sent at 02/12/2019  3:06 PM EDT ----- Regarding: Lab orders for Friday, 6.12.20 Lab orders for 2 months

## 2019-02-21 ENCOUNTER — Other Ambulatory Visit: Payer: Self-pay

## 2019-02-21 ENCOUNTER — Other Ambulatory Visit (INDEPENDENT_AMBULATORY_CARE_PROVIDER_SITE_OTHER): Payer: Self-pay

## 2019-02-21 DIAGNOSIS — IMO0002 Reserved for concepts with insufficient information to code with codable children: Secondary | ICD-10-CM

## 2019-02-21 DIAGNOSIS — E118 Type 2 diabetes mellitus with unspecified complications: Secondary | ICD-10-CM

## 2019-02-21 DIAGNOSIS — E1165 Type 2 diabetes mellitus with hyperglycemia: Secondary | ICD-10-CM

## 2019-02-21 DIAGNOSIS — E119 Type 2 diabetes mellitus without complications: Secondary | ICD-10-CM

## 2019-02-21 DIAGNOSIS — M1A0710 Idiopathic chronic gout, right ankle and foot, without tophus (tophi): Secondary | ICD-10-CM

## 2019-02-21 LAB — COMPREHENSIVE METABOLIC PANEL
ALT: 20 U/L (ref 0–53)
AST: 23 U/L (ref 0–37)
Albumin: 4.1 g/dL (ref 3.5–5.2)
Alkaline Phosphatase: 75 U/L (ref 39–117)
BUN: 13 mg/dL (ref 6–23)
CO2: 24 mEq/L (ref 19–32)
Calcium: 9.6 mg/dL (ref 8.4–10.5)
Chloride: 103 mEq/L (ref 96–112)
Creatinine, Ser: 0.87 mg/dL (ref 0.40–1.50)
GFR: 90.03 mL/min (ref >60.00)
Glucose, Bld: 105 mg/dL — ABNORMAL HIGH (ref 70–99)
Potassium: 4.1 mEq/L (ref 3.5–5.1)
Sodium: 139 mEq/L (ref 135–145)
Total Bilirubin: 0.8 mg/dL (ref 0.2–1.2)
Total Protein: 7.5 g/dL (ref 6.0–8.3)

## 2019-02-21 LAB — MICROALBUMIN / CREATININE URINE RATIO
Creatinine,U: 118.9 mg/dL
Microalb Creat Ratio: 0.6 mg/g (ref 0.0–30.0)
Microalb, Ur: <0.7 mg/dL (ref 0.0–1.9)

## 2019-02-21 LAB — URIC ACID: Uric Acid, Serum: 8.1 mg/dL — ABNORMAL HIGH (ref 4.0–7.8)

## 2019-02-21 LAB — HEMOGLOBIN A1C: Hgb A1c MFr Bld: 7.8 % — ABNORMAL HIGH (ref 4.6–6.5)

## 2019-02-21 NOTE — Addendum Note (Signed)
Addended by: Ellamae Sia on: 02/21/2019 10:01 AM   Modules accepted: Orders

## 2019-02-25 ENCOUNTER — Other Ambulatory Visit: Payer: Self-pay

## 2019-02-25 ENCOUNTER — Ambulatory Visit (INDEPENDENT_AMBULATORY_CARE_PROVIDER_SITE_OTHER): Payer: Self-pay | Admitting: Family Medicine

## 2019-02-25 ENCOUNTER — Encounter: Payer: Self-pay | Admitting: Family Medicine

## 2019-02-25 VITALS — BP 197/93 | HR 67

## 2019-02-25 DIAGNOSIS — E118 Type 2 diabetes mellitus with unspecified complications: Secondary | ICD-10-CM

## 2019-02-25 DIAGNOSIS — E1169 Type 2 diabetes mellitus with other specified complication: Secondary | ICD-10-CM

## 2019-02-25 DIAGNOSIS — E1165 Type 2 diabetes mellitus with hyperglycemia: Secondary | ICD-10-CM

## 2019-02-25 DIAGNOSIS — I10 Essential (primary) hypertension: Secondary | ICD-10-CM

## 2019-02-25 DIAGNOSIS — IMO0002 Reserved for concepts with insufficient information to code with codable children: Secondary | ICD-10-CM

## 2019-02-25 DIAGNOSIS — E785 Hyperlipidemia, unspecified: Secondary | ICD-10-CM

## 2019-02-25 DIAGNOSIS — M1A0710 Idiopathic chronic gout, right ankle and foot, without tophus (tophi): Secondary | ICD-10-CM

## 2019-02-25 DIAGNOSIS — I517 Cardiomegaly: Secondary | ICD-10-CM

## 2019-02-25 DIAGNOSIS — E1143 Type 2 diabetes mellitus with diabetic autonomic (poly)neuropathy: Secondary | ICD-10-CM

## 2019-02-25 MED ORDER — ROSUVASTATIN CALCIUM 20 MG PO TABS: 20.0000 mg | ORAL_TABLET | Freq: Every day | ORAL | 3 refills | Status: DC

## 2019-02-25 MED ORDER — HYDROCHLOROTHIAZIDE 25 MG PO TABS: 25.0000 mg | ORAL_TABLET | Freq: Every day | ORAL | 1 refills | Status: DC

## 2019-02-25 MED ORDER — GABAPENTIN 400 MG PO CAPS: 400.0000 mg | ORAL_CAPSULE | Freq: Every day | ORAL | 1 refills | Status: DC

## 2019-02-25 MED ORDER — LISINOPRIL 10 MG PO TABS: 10.0000 mg | ORAL_TABLET | Freq: Every day | ORAL | 3 refills | Status: DC

## 2019-02-25 NOTE — Assessment & Plan Note (Signed)
IMproved control.. only using 1 tab po qHS of gabapentin.

## 2019-02-25 NOTE — Patient Instructions (Addendum)
Increase metformin to 1000 mg ER daily as we had thought you were on. Keep up low carb diet.  Stay on restarted lisinopril 10 mg.  We will  Call you to set up ECHO. Follow BP at home with arm cuff.   Low-Purine Eating Plan A low-purine eating plan involves making food choices to limit your intake of purine. Purine is a kind of uric acid. Too much uric acid in your blood can cause certain conditions, such as gout and kidney stones. Eating a low-purine diet can help control these conditions. What are tips for following this plan? Reading food labels   Avoid foods with saturated or Trans fat.  Check the ingredient list of grains-based foods, such as bread and cereal, to make sure that they contain whole grains.  Check the ingredient list of sauces or soups to make sure they do not contain meat or fish.  When choosing soft drinks, check the ingredient list to make sure they do not contain high-fructose corn syrup. Shopping  Buy plenty of fresh fruits and vegetables.  Avoid buying canned or fresh fish.  Buy dairy products labeled as low-fat or nonfat.  Avoid buying premade or processed foods. These foods are often high in fat, salt (sodium), and added sugar. Cooking  Use olive oil instead of butter when cooking. Oils like olive oil, canola oil, and sunflower oil contain healthy fats. Meal planning  Learn which foods do or do not affect you. If you find out that a food tends to cause your gout symptoms to flare up, avoid eating that food. You can enjoy foods that do not cause problems. If you have any questions about a food item, talk with your dietitian or health care provider.  Limit foods high in fat, especially saturated fat. Fat makes it harder for your body to get rid of uric acid.  Choose foods that are lower in fat and are lean sources of protein. General guidelines  Limit alcohol intake to no more than 1 drink a day for nonpregnant women and 2 drinks a day for men. One  drink equals 12 oz of beer, 5 oz of wine, or 1 oz of hard liquor. Alcohol can affect the way your body gets rid of uric acid.  Drink plenty of water to keep your urine clear or pale yellow. Fluids can help remove uric acid from your body.  If directed by your health care provider, take a vitamin C supplement.  Work with your health care provider and dietitian to develop a plan to achieve or maintain a healthy weight. Losing weight can help reduce uric acid in your blood. What foods are recommended? The items listed may not be a complete list. Talk with your dietitian about what dietary choices are best for you. Foods low in purines Foods low in purines do not need to be limited. These include:  All fruits.  All low-purine vegetables, pickles, and olives.  Breads, pasta, rice, cornbread, and popcorn. Cake and other baked goods.  All dairy foods.  Eggs, nuts, and nut butters.  Spices and condiments, such as salt, herbs, and vinegar.  Plant oils, butter, and margarine.  Water, sugar-free soft drinks, tea, coffee, and cocoa.  Vegetable-based soups, broths, sauces, and gravies. Foods moderate in purines Foods moderate in purines should be limited to the amounts listed.   cup of asparagus, cauliflower, spinach, mushrooms, or green peas, each day.  2/3 cup uncooked oatmeal, each day.   cup dry wheat bran or wheat  germ, each day.  2-3 ounces of meat or poultry, each day.  4-6 ounces of shellfish, such as crab, lobster, oysters, or shrimp, each day.  1 cup cooked beans, peas, or lentils, each day.  Soup, broths, or bouillon made from meat or fish. Limit these foods as much as possible. What foods are not recommended? The items listed may not be a complete list. Talk with your dietitian about what dietary choices are best for you. Limit your intake of foods high in purines, including:  Beer and other alcohol.  Meat-based gravy or sauce.  Canned or fresh fish, such  as: ? Anchovies, sardines, herring, and tuna. ? Mussels and scallops. ? Codfish, trout, and haddock.  Berniece Salines.  Organ meats, such as: ? Liver or kidney. ? Tripe. ? Sweetbreads (thymus gland or pancreas).  Wild Clinical biochemist.  Yeast or yeast extract supplements.  Drinks sweetened with high-fructose corn syrup. Summary  Eating a low-purine diet can help control conditions caused by too much uric acid in the body, such as gout or kidney stones.  Choose low-purine foods, limit alcohol, and limit foods high in fat.  You will learn over time which foods do or do not affect you. If you find out that a food tends to cause your gout symptoms to flare up, avoid eating that food. This information is not intended to replace advice given to you by your health care provider. Make sure you discuss any questions you have with your health care provider. Document Released: 12/23/2010 Document Revised: 10/11/2016 Document Reviewed: 10/11/2016 Elsevier Interactive Patient Education  2019 [employer].

## 2019-02-25 NOTE — Progress Notes (Signed)
VIRTUAL VISIT Due to national recommendations of social distancing due to [place] 19, a virtual visit is felt to be most appropriate for this patient at this time.   I connected with the patient on 02/25/19 at  9:20 AM EDT by virtual telehealth platform and verified that I am speaking with the correct person using two identifiers.   I discussed the limitations, risks, security and privacy concerns of performing an evaluation and management service by  virtual telehealth platform and the availability of in person appointments. I also discussed with the patient that there may be a patient responsible charge related to this service. The patient expressed understanding and agreed to proceed.  Patient location: Home Provider Location:  [facility] Participants: [name] and [name]   Chief Complaint  Patient presents with  . Diabetes    History of Present Illness: 58 year old male pt history of stroke on aspirin, cauda equina status post surgical repair in October 5397 complicated by DVT, no longer on anticoagulation, hypertension, hyperlipidemia, diabetes presents for DM follow up.  Of note he was seen in ER on 02/11/2019 for transiently elevated BP.  Patient was in his usual state of health until this evening.  He was sitting on the computer when he reports feeling very hot and breaking out in a sweat involving just his head.  He decided to check his blood pressure which was elevated with systolics in the 673A.  Because of a prior stroke patient was concerned and came to the emergency room for evaluation.  He reports that after looking at his blood pressure he felt very anxious and had a very brief episode of shortness of breath which resolved when he was able to calm himself down.  IN ER BP was measured at 143/79, neg EKG  neg troponin   referred to cardiology for eval of cardiomegaly  Diabetes: Off glipizide and on metformin 500 mg ER  eating a lot of salads lately, low  carbs. No soda now in last week. Lab Results  Component Value Date   HGBA1C 7.8 (H) 02/21/2019  Using medications without difficulties: Hypoglycemic episodes:none Hyperglycemic episodes: once 280 in last 2 weeks after a soda Feet problems: neuropathy, no ulcers Blood Sugars averaging: FBS 122 eye exam within last year: due  Hypertension:    He has been restricting salt ion diet. Poor control on HCTZ.  Wrist cuff. He lisinopril 10 mg daily. BP Readings from Last 3 Encounters:  02/25/19 (!) 197/93  02/12/19 120/68  06/22/18 (!) 149/98  Using medication without problems or lightheadedness: none Chest pain with exertion:none Edema:none Short of breath: Average home BPs: Other issues:   CXR: IMPRESSION: Cardiomegaly with mild volume overload.   elevated uric acid 8.1  occ gout flare.. not on allopurinol  Neuropathic pain: Stable control on gabapentin 800 mg TID  COVID 19 screen No recent travel or known exposure to [place] The patient denies respiratory symptoms of COVID 19 at this time.  The importance of social distancing was discussed today.   Review of Systems  Constitutional: Negative for chills and fever.  HENT: Negative for congestion and ear pain.   Eyes: Negative for pain and redness.  Respiratory: Negative for cough and shortness of breath.   Cardiovascular: Negative for chest pain, palpitations and leg swelling.  Gastrointestinal: Negative for abdominal pain, blood in stool, constipation, diarrhea, nausea and vomiting.  Genitourinary: Negative for dysuria.  Musculoskeletal: Negative for falls and myalgias.  Skin: Negative for rash.  Neurological: Negative  for dizziness.  Psychiatric/Behavioral: Negative for depression. The patient is not nervous/anxious.       Past Medical History:  Diagnosis Date  . Arthritis   . Depression   . Diabetes mellitus without complication ([place])   . Frequent headaches   . Gout   . Hyperlipidemia   . Hypertension   . Stroke  [facility])     reports that he quit smoking about 29 years ago. He has never used smokeless tobacco. He reports current alcohol use. He reports that he does not use drugs.   Current Outpatient Medications:  .  acetaminophen (TYLENOL) 325 MG tablet, Take 325 mg by mouth every 4 (four) hours as needed. , Disp: , Rfl:  .  aspirin 81 MG tablet, Take 81 mg by mouth daily., Disp: , Rfl:  .  blood glucose meter kit and supplies, Dispense based on patient and insurance preference. Use to check blood sugar two times a day., Disp: 1 each, Rfl: 0 .  colchicine 0.6 MG tablet, Take 0.6 mg by mouth daily. , Disp: , Rfl:  .  diclofenac sodium (VOLTAREN) 1 % GEL, Apply topically., Disp: , Rfl:  .  gabapentin (NEURONTIN) 400 MG capsule, Take 800 mg by mouth 3 (three) times daily., Disp: , Rfl:  .  hydrochlorothiazide (HYDRODIURIL) 25 MG tablet, Take 25 mg by mouth daily., Disp: , Rfl:  .  hydrocortisone cream 1 %, Apply topically., Disp: , Rfl:  .  lisinopril (ZESTRIL) 10 MG tablet, Take 10 mg by mouth daily., Disp: , Rfl:  .  metFORMIN (GLUCOPHAGE-XR) 500 MG 24 hr tablet, Take 2 tablets (1,000 mg total) by mouth daily with breakfast., Disp: 60 tablet, Rfl: 11 .  methocarbamol (ROBAXIN) 750 MG tablet, Take 750 mg by mouth daily as needed for muscle spasms., Disp: , Rfl:  .  nystatin cream (MYCOSTATIN), Apply topically., Disp: , Rfl:  .  polyethylene glycol (MIRALAX / GLYCOLAX) packet, Take 17 g by mouth daily., Disp: 14 each, Rfl: 0 .  rosuvastatin (CRESTOR) 20 MG tablet, Take 1 tablet (20 mg total) by mouth at bedtime., Disp: 90 tablet, Rfl: 3   Observations/Objective: Blood pressure (!) 197/93, pulse 67.  Physical Exam  Physical Exam Constitutional:      General: The patient is not in acute distress. Pulmonary:     Effort: Pulmonary effort is normal. No respiratory distress.  Neurological:     Mental Status: The patient is alert and oriented to person, place, and time.  Psychiatric:        Mood and  Affect: Mood normal.        Behavior: Behavior normal.   Assessment and Plan Peripheral autonomic neuropathy due to diabetes mellitus ([place]) IMproved control.. only using 1 tab po qHS of gabapentin.  Hypertension Resent elevation..; restart lisinopril.  Do not use wrist cuff.  Come in for BP check with nurse visit.  Hyperlipidemia associated with type 2 diabetes mellitus (HCC) Continue statin.  Diabetes mellitus type 2, uncontrolled, with complications (HCC) Improving control.. increase metformin to 1000 mg ER as he was supposed to be on.  Cardiomegaly Eval with ECHO.. no current chest pain, SOB, edema etc.     I discussed the assessment and treatment plan with the patient. The patient was provided an opportunity to ask questions and all were answered. The patient agreed with the plan and demonstrated an understanding of the instructions.   The patient was advised to call back or seek an in-person evaluation if the symptoms worsen or  if the condition fails to improve as anticipated.     [name], MD

## 2019-02-25 NOTE — Assessment & Plan Note (Signed)
Continue statin. 

## 2019-02-25 NOTE — Assessment & Plan Note (Signed)
Resent elevation..; restart lisinopril.  Do not use wrist cuff.  Come in for BP check with nurse visit.

## 2019-02-25 NOTE — Assessment & Plan Note (Signed)
Improving control.. increase metformin to 1000 mg ER as he was supposed to be on.

## 2019-02-25 NOTE — Assessment & Plan Note (Signed)
Eval with ECHO.. no current chest pain, SOB, edema etc.

## 2019-02-27 ENCOUNTER — Telehealth: Payer: Self-pay | Admitting: Pharmacy Technician

## 2019-02-27 NOTE — Telephone Encounter (Signed)
Patient has provided some of the financial information requested.  However, [facility] still needs for his support person, Sagan Maselli to sign the Letter of Support.  Jamie Finley stated that he would come in on 02/26/19 to sign this document.  However, Jamie Finley has not presented to our clinic.  Eligibility can not be determined until [facility] receives the Letter of Support.  [facility]

## 2019-02-28 ENCOUNTER — Telehealth: Payer: Self-pay | Admitting: Pharmacy Technician

## 2019-02-28 NOTE — Telephone Encounter (Signed)
Patient provided letter of support.  Approved to receive medication assistance at [facility] as long as eligibility criteria continues to be met.  [facility]

## 2019-02-28 NOTE — Progress Notes (Signed)
Left message asking pt to call office 6/19/[facility]

## 2019-03-04 NOTE — Progress Notes (Signed)
bp check 7/7 Labs 9/29 Follow up 10/2 Pt aware

## 2019-03-18 ENCOUNTER — Ambulatory Visit (INDEPENDENT_AMBULATORY_CARE_PROVIDER_SITE_OTHER): Payer: Self-pay

## 2019-03-18 ENCOUNTER — Other Ambulatory Visit: Payer: Self-pay

## 2019-03-18 VITALS — BP 98/58 | HR 62 | Temp 97.0°F | Resp 16

## 2019-03-18 DIAGNOSIS — I10 Essential (primary) hypertension: Secondary | ICD-10-CM

## 2019-03-18 NOTE — Addendum Note (Signed)
Addended by: Carter Kitten on: 03/18/2019 02:45 PM   Modules accepted: Orders

## 2019-03-18 NOTE — Progress Notes (Signed)
Jamie Finley notified by telephone to decrease his Lisinopril to 1/2 tablet daily (5 mg) per Dr. Diona Browner.  Patient states understanding.  Medication list updated.

## 2019-03-18 NOTE — Progress Notes (Signed)
Per Dr. Diona Browner encounter order on 02/25/2019 , patient presents today for a nurse visit blood pressure check for ongoing follow up and management.  Vital Sign Readings today BP 102/60, RECHECK 98/58, HR 62, Temp 97.0, 02 sat 95%.   Patient has not taken his am bp pill yet and I told him to hold it this am until he hears back from Dr. Diona Browner today regarding next steps due to borderline low reading.  He does not complain of any symptoms, denies any increased fatigue, dizziness or weakness.  He did mention having a headache last pm but otherwise has been fine.   Will send to Dr. Diona Browner for immediate direction and follow up planning.

## 2019-03-18 NOTE — Progress Notes (Signed)
Have pt decrease lisinopril to 1/2 tabs daily ( 5 mg daily)

## 2019-03-26 ENCOUNTER — Other Ambulatory Visit: Payer: Self-pay

## 2019-05-21 ENCOUNTER — Other Ambulatory Visit: Payer: Self-pay

## 2019-06-04 ENCOUNTER — Telehealth: Payer: Self-pay | Admitting: Family Medicine

## 2019-06-04 DIAGNOSIS — E1165 Type 2 diabetes mellitus with hyperglycemia: Secondary | ICD-10-CM

## 2019-06-04 DIAGNOSIS — M1A0710 Idiopathic chronic gout, right ankle and foot, without tophus (tophi): Secondary | ICD-10-CM

## 2019-06-04 DIAGNOSIS — Z125 Encounter for screening for malignant neoplasm of prostate: Secondary | ICD-10-CM

## 2019-06-04 DIAGNOSIS — IMO0002 Reserved for concepts with insufficient information to code with codable children: Secondary | ICD-10-CM

## 2019-06-04 NOTE — Telephone Encounter (Signed)
-----   Message from Ellamae Sia sent at 06/03/2019  3:23 PM EDT ----- Regarding: Lab orders for Tuesday, 9.29.20 Lab orders for a 3 month follow up appt.

## 2019-06-10 ENCOUNTER — Other Ambulatory Visit (INDEPENDENT_AMBULATORY_CARE_PROVIDER_SITE_OTHER): Payer: Medicaid Other

## 2019-06-10 DIAGNOSIS — M1A0710 Idiopathic chronic gout, right ankle and foot, without tophus (tophi): Secondary | ICD-10-CM | POA: Diagnosis not present

## 2019-06-10 DIAGNOSIS — E1165 Type 2 diabetes mellitus with hyperglycemia: Secondary | ICD-10-CM

## 2019-06-10 DIAGNOSIS — Z125 Encounter for screening for malignant neoplasm of prostate: Secondary | ICD-10-CM

## 2019-06-10 DIAGNOSIS — IMO0002 Reserved for concepts with insufficient information to code with codable children: Secondary | ICD-10-CM

## 2019-06-10 DIAGNOSIS — E118 Type 2 diabetes mellitus with unspecified complications: Secondary | ICD-10-CM | POA: Diagnosis not present

## 2019-06-10 LAB — URIC ACID: Uric Acid, Serum: 6.7 mg/dL (ref 4.0–7.8)

## 2019-06-10 LAB — COMPREHENSIVE METABOLIC PANEL
ALT: 17 U/L (ref 0–53)
AST: 17 U/L (ref 0–37)
Albumin: 4.2 g/dL (ref 3.5–5.2)
Alkaline Phosphatase: 76 U/L (ref 39–117)
BUN: 14 mg/dL (ref 6–23)
CO2: 28 mEq/L (ref 19–32)
Calcium: 9.7 mg/dL (ref 8.4–10.5)
Chloride: 98 mEq/L (ref 96–112)
Creatinine, Ser: 1.01 mg/dL (ref 0.40–1.50)
GFR: 75.71 mL/min (ref >60.00)
Glucose, Bld: 245 mg/dL — ABNORMAL HIGH (ref 70–99)
Potassium: 4.1 mEq/L (ref 3.5–5.1)
Sodium: 136 mEq/L (ref 135–145)
Total Bilirubin: 0.8 mg/dL (ref 0.2–1.2)
Total Protein: 7.1 g/dL (ref 6.0–8.3)

## 2019-06-10 LAB — LIPID PANEL
Cholesterol: 155 mg/dL (ref 0–200)
HDL: 47.7 mg/dL (ref >39.00)
LDL Cholesterol: 74 mg/dL (ref 0–99)
NonHDL: 107.32
Total CHOL/HDL Ratio: 3
Triglycerides: 168 mg/dL — ABNORMAL HIGH (ref 0.0–149.0)
VLDL: 33.6 mg/dL (ref 0.0–40.0)

## 2019-06-10 LAB — HEMOGLOBIN A1C: Hgb A1c MFr Bld: 9.3 % — ABNORMAL HIGH (ref 4.6–6.5)

## 2019-06-10 LAB — PSA: PSA: 0.32 ng/mL (ref 0.10–4.00)

## 2019-06-10 NOTE — Progress Notes (Signed)
No critical labs need to be addressed urgently. We will discuss labs in detail at upcoming office visit.   

## 2019-06-13 ENCOUNTER — Ambulatory Visit (INDEPENDENT_AMBULATORY_CARE_PROVIDER_SITE_OTHER): Payer: Medicaid Other | Admitting: Family Medicine

## 2019-06-13 ENCOUNTER — Encounter: Payer: Self-pay | Admitting: Family Medicine

## 2019-06-13 ENCOUNTER — Telehealth: Payer: Self-pay | Admitting: Family Medicine

## 2019-06-13 ENCOUNTER — Ambulatory Visit: Payer: Self-pay | Admitting: Family Medicine

## 2019-06-13 VITALS — BP 167/83 | HR 67 | Ht 72.0 in

## 2019-06-13 DIAGNOSIS — K5900 Constipation, unspecified: Secondary | ICD-10-CM

## 2019-06-13 DIAGNOSIS — E785 Hyperlipidemia, unspecified: Secondary | ICD-10-CM

## 2019-06-13 DIAGNOSIS — E1169 Type 2 diabetes mellitus with other specified complication: Secondary | ICD-10-CM

## 2019-06-13 DIAGNOSIS — M1A0710 Idiopathic chronic gout, right ankle and foot, without tophus (tophi): Secondary | ICD-10-CM

## 2019-06-13 DIAGNOSIS — I10 Essential (primary) hypertension: Secondary | ICD-10-CM | POA: Diagnosis not present

## 2019-06-13 DIAGNOSIS — E1165 Type 2 diabetes mellitus with hyperglycemia: Secondary | ICD-10-CM

## 2019-06-13 DIAGNOSIS — E118 Type 2 diabetes mellitus with unspecified complications: Secondary | ICD-10-CM

## 2019-06-13 DIAGNOSIS — IMO0002 Reserved for concepts with insufficient information to code with codable children: Secondary | ICD-10-CM

## 2019-06-13 DIAGNOSIS — I517 Cardiomegaly: Secondary | ICD-10-CM

## 2019-06-13 NOTE — Patient Instructions (Addendum)
Change the metformin to taking both tablets in morning to help with compliance.  Check FBS over the  1-2 weeks...  Call or Mychart the results... we could  increase metformin.   Call to set up 3 month follow up, in office with labs prior   We will call to set up ultrasound of heart ECHO.   If chest pressure continuing or and shortness of breath.. make an in [place] for EKG etc.   Get arm cuff.. for BP check... let us know BP in 1-2 weeks.  Start miralax and or benefiber/metamucil as fiber supplement.

## 2019-06-13 NOTE — Progress Notes (Signed)
VIRTUAL VISIT Due to national recommendations of social distancing due to [place] 19, a virtual visit is felt to be most appropriate for this patient at this time.   I connected with the patient on 06/13/19 at  9:40 AM EDT by virtual telehealth platform and verified that I am speaking with the correct person using two identifiers.   I discussed the limitations, risks, security and privacy concerns of performing an evaluation and management service by  virtual telehealth platform and the availability of in person appointments. I also discussed with the patient that there may be a patient responsible charge related to this service. The patient expressed understanding and agreed to proceed.  Patient location: Home Provider Location: [facility] Participants: Jamie Finley and Jamie Finley   Chief Complaint  Patient presents with  . Diabetes  . Hypertension    History of Present Illness: 58 year old male presents for follow up DM, Chol, HTN  Diabetes:   Worsened control on  Metformin ER 1000 mg daily.He forgets the second dose of the metfomrin.. 75% of the days. Lab Results  Component Value Date   HGBA1C 9.3 (H) 06/10/2019  Using medications without difficulties: Hypoglycemic episodes: Hyperglycemic episodes:? Feet problems:none Blood Sugars averaging:not lately eye exam within last year: due  Elevated Cholesterol:  LDL at goal < 100 on crestor 20 Lab Results  Component Value Date   CHOL 155 06/10/2019   HDL 47.70 06/10/2019   LDLCALC 74 06/10/2019   LDLDIRECT 151.0 06/20/2018   TRIG 168.0 (H) 06/10/2019   CHOLHDL 3 06/10/2019  Using medications without problems: Muscle aches:  Diet compliance: salads, less sodas Exercise: minimal Other complaints:   Hypertension:    Too low on lisinopril 10 mg.. now too high on lisinopril 5 mg And HCTZ Todays measurment on wrist cuff. BP Readings from Last 3 Encounters:  06/13/19 (!) 167/83  03/18/19 (!) 98/58  02/25/19 (!)  197/93  Using medication without problems or lightheadedness:  none Chest pain with exertion:none, occ pain at rest, seems like after straining upper body from moving self, lifting. Edema:none Short of breath:none Average home BPs: Other issues:  CXR:  Showed cardiomegaly  Gout: uric acid at 6.7. using colchicine prn. Flares less frequent but still ongoing every months.  He has constipation given his spinal cord injury.  COVID 19 screen No recent travel or known exposure to COVID19 The patient denies respiratory symptoms of COVID 19 at this time.  The importance of social distancing was discussed today.   Review of Systems  Constitutional: Negative for chills and fever.  HENT: Negative for congestion and ear pain.   Eyes: Negative for pain and redness.  Respiratory: Negative for cough and shortness of breath.   Cardiovascular: Negative for chest pain, palpitations and leg swelling.  Gastrointestinal: Negative for abdominal pain, blood in stool, constipation, diarrhea, nausea and vomiting.  Genitourinary: Negative for dysuria.  Musculoskeletal: Negative for falls and myalgias.  Skin: Negative for rash.  Neurological: Negative for dizziness.  Psychiatric/Behavioral: Negative for depression. The patient is not nervous/anxious.       Past Medical History:  Diagnosis Date  . Arthritis   . Depression   . Diabetes mellitus without complication ([place])   . Frequent headaches   . Gout   . Hyperlipidemia   . Hypertension   . Stroke [facility])     reports that he quit smoking about 29 years ago. He has never used smokeless tobacco. He reports current alcohol use. He reports that he  does not use drugs.   Current Outpatient Medications:  .  acetaminophen (TYLENOL) 325 MG tablet, Take 325 mg by mouth every 4 (four) hours as needed. , Disp: , Rfl:  .  aspirin 81 MG tablet, Take 81 mg by mouth daily., Disp: , Rfl:  .  blood glucose meter kit and supplies, Dispense based on patient and  insurance preference. Use to check blood sugar two times a day., Disp: 1 each, Rfl: 0 .  colchicine 0.6 MG tablet, Take 0.6 mg by mouth daily. , Disp: , Rfl:  .  gabapentin (NEURONTIN) 400 MG capsule, Take 1 capsule (400 mg total) by mouth at bedtime. (Patient taking differently: Take 400 mg by mouth at bedtime as needed. ), Disp: 90 capsule, Rfl: 1 .  hydrochlorothiazide (HYDRODIURIL) 25 MG tablet, Take 1 tablet (25 mg total) by mouth daily., Disp: 90 tablet, Rfl: 1 .  lisinopril (ZESTRIL) 10 MG tablet, Take 5 mg by mouth daily., Disp: , Rfl:  .  metFORMIN (GLUCOPHAGE-XR) 500 MG 24 hr tablet, Take 2 tablets (1,000 mg total) by mouth daily with breakfast. (Patient taking differently: Take 500 mg by mouth 2 (two) times daily. ), Disp: 60 tablet, Rfl: 11 .  methocarbamol (ROBAXIN) 750 MG tablet, Take 750 mg by mouth daily as needed for muscle spasms., Disp: , Rfl:  .  rosuvastatin (CRESTOR) 20 MG tablet, Take 1 tablet (20 mg total) by mouth at bedtime., Disp: 90 tablet, Rfl: 3 .  diclofenac sodium (VOLTAREN) 1 % GEL, Apply topically., Disp: , Rfl:  .  hydrocortisone cream 1 %, Apply topically., Disp: , Rfl:  .  polyethylene glycol (MIRALAX / GLYCOLAX) packet, Take 17 g by mouth daily. (Patient not taking: Reported on 06/13/2019), Disp: 14 each, Rfl: 0   Observations/Objective: Blood pressure (!) 167/83, pulse 67, height 6' (1.829 m).  Physical Exam Physical Exam Constitutional:      General: The patient is not in acute distress. Pulmonary:     Effort: Pulmonary effort is normal. No respiratory distress.  Neurological:     Mental Status: The patient is alert and oriented to person, place, and time.  Psychiatric:        Mood and Affect: Mood normal.        Behavior: Behavior normal.   Assessment and Plan Diabetes mellitus type 2, uncontrolled, with complications (HCC) Pt can take metformin ER at same time to increase compliance.  Check FBS over the  1-2 weeks...  Call or Mychart the results...  we could  increase metformin.   Hyperlipidemia associated with type 2 diabetes mellitus ([place]) LDL at goal < 100 on crestor 20  Constipation  Start miralax and or benefiber/metamucil as fiber supplement.   Hypertension Get arm cuff.. for BP check... let us know BP in 1-2 weeks.   Cardiomegaly Now able to do ECHO to eval cardiomegaly seen on CXR.  Gout of right foot Less frequent flares.. uric acid < 7. No need for allopurinol at this time. Using colchicine prn.     I discussed the assessment and treatment plan with the patient. The patient was provided an opportunity to ask questions and all were answered. The patient agreed with the plan and demonstrated an understanding of the instructions.   The patient was advised to call back or seek an in-person evaluation if the symptoms worsen or if the condition fails to improve as anticipated.     Jamie Lofts, MD

## 2019-06-13 NOTE — Telephone Encounter (Signed)
Informed pt about echocardiogram on 07-16-2019@[facility].  Pt understands to arrive@8 :15 a.m. for 8:30 a.m. echo.  Gave pt phone and address for [facility] [place] and to go through [facility] entrance.

## 2019-07-16 ENCOUNTER — Ambulatory Visit (INDEPENDENT_AMBULATORY_CARE_PROVIDER_SITE_OTHER): Payer: Medicaid Other

## 2019-07-16 ENCOUNTER — Other Ambulatory Visit: Payer: Self-pay

## 2019-07-16 DIAGNOSIS — I517 Cardiomegaly: Secondary | ICD-10-CM | POA: Diagnosis not present

## 2019-07-16 MED ORDER — PERFLUTREN LIPID MICROSPHERE
1.0000 mL | INTRAVENOUS | Status: AC | PRN
  Administered 2019-07-16: 2 mL via INTRAVENOUS

## 2019-07-17 ENCOUNTER — Encounter: Payer: Self-pay | Admitting: Family Medicine

## 2019-07-17 NOTE — Assessment & Plan Note (Signed)
Get arm cuff.. for BP check... let us know BP in 1-2 weeks.

## 2019-07-17 NOTE — Assessment & Plan Note (Addendum)
Pt can take metformin ER at same time to increase compliance.  Check FBS over the  1-2 weeks...  Call or Mychart the results... we could  increase metformin.

## 2019-07-17 NOTE — Assessment & Plan Note (Signed)
Start miralax and or benefiber/metamucil as fiber supplement.

## 2019-07-17 NOTE — Assessment & Plan Note (Signed)
Now able to do ECHO to eval cardiomegaly seen on CXR.

## 2019-07-17 NOTE — Assessment & Plan Note (Signed)
LDL at goal < 100 on crestor 20

## 2019-07-17 NOTE — Assessment & Plan Note (Signed)
Less frequent flares.. uric acid < 7. No need for allopurinol at this time. Using colchicine prn.

## 2019-07-25 ENCOUNTER — Telehealth: Payer: Self-pay | Admitting: Pharmacy Technician

## 2019-07-25 NOTE — Telephone Encounter (Signed)
Patient has Medicaid with prescription drug coverage.  No longer meets [facility]'s eligibility criteria.  Patient notified.  Tamantha Saline J. Aviyanna Colbaugh Care Manager [facility] 

## 2019-08-18 LAB — HM DIABETES FOOT EXAM

## 2019-09-02 ENCOUNTER — Telehealth: Payer: Self-pay | Admitting: Family Medicine

## 2019-09-02 DIAGNOSIS — E785 Hyperlipidemia, unspecified: Secondary | ICD-10-CM

## 2019-09-02 MED ORDER — ROSUVASTATIN CALCIUM 20 MG PO TABS: 20.0000 mg | ORAL_TABLET | Freq: Every day | ORAL | 2 refills | Status: DC

## 2019-09-02 MED ORDER — METFORMIN HCL ER 500 MG PO TB24: 1000.0000 mg | ORAL_TABLET | Freq: Every day | ORAL | 1 refills | Status: DC

## 2019-09-02 MED ORDER — HYDROCHLOROTHIAZIDE 25 MG PO TABS: 25.0000 mg | ORAL_TABLET | Freq: Every day | ORAL | 1 refills | Status: DC

## 2019-09-02 MED ORDER — LISINOPRIL 10 MG PO TABS: 5.0000 mg | ORAL_TABLET | Freq: Every day | ORAL | 1 refills | Status: DC

## 2019-09-02 NOTE — Telephone Encounter (Signed)
Patient is requesting a pharmacy change and needs new script sent over to that pharmacy   Rosuvastatin  Hydrochlorothiazide  Metformin Lisinopril   [place]

## 2019-09-02 NOTE — Telephone Encounter (Signed)
Refills sent as requested

## 2019-09-10 ENCOUNTER — Encounter: Payer: Self-pay | Admitting: Family Medicine

## 2019-09-10 ENCOUNTER — Ambulatory Visit: Payer: Medicaid Other | Admitting: Family Medicine

## 2019-09-10 ENCOUNTER — Other Ambulatory Visit: Payer: Self-pay

## 2019-09-10 VITALS — BP 101/65 | HR 75 | Temp 98.3°F | Ht 72.0 in | Wt 314.5 lb

## 2019-09-10 DIAGNOSIS — E785 Hyperlipidemia, unspecified: Secondary | ICD-10-CM

## 2019-09-10 DIAGNOSIS — E1143 Type 2 diabetes mellitus with diabetic autonomic (poly)neuropathy: Secondary | ICD-10-CM | POA: Diagnosis not present

## 2019-09-10 DIAGNOSIS — K5909 Other constipation: Secondary | ICD-10-CM

## 2019-09-10 DIAGNOSIS — E1165 Type 2 diabetes mellitus with hyperglycemia: Secondary | ICD-10-CM

## 2019-09-10 DIAGNOSIS — IMO0002 Reserved for concepts with insufficient information to code with codable children: Secondary | ICD-10-CM

## 2019-09-10 DIAGNOSIS — E1159 Type 2 diabetes mellitus with other circulatory complications: Secondary | ICD-10-CM | POA: Diagnosis not present

## 2019-09-10 DIAGNOSIS — F3340 Major depressive disorder, recurrent, in remission, unspecified: Secondary | ICD-10-CM

## 2019-09-10 DIAGNOSIS — E118 Type 2 diabetes mellitus with unspecified complications: Secondary | ICD-10-CM | POA: Diagnosis not present

## 2019-09-10 DIAGNOSIS — I10 Essential (primary) hypertension: Secondary | ICD-10-CM

## 2019-09-10 DIAGNOSIS — E1169 Type 2 diabetes mellitus with other specified complication: Secondary | ICD-10-CM

## 2019-09-10 LAB — POCT GLYCOSYLATED HEMOGLOBIN (HGB A1C): Hemoglobin A1C: 11.3 % — AB (ref 4.0–5.6)

## 2019-09-10 MED ORDER — METHOCARBAMOL 750 MG PO TABS: 750.0000 mg | ORAL_TABLET | Freq: Every day | ORAL | 1 refills | Status: DC | PRN

## 2019-09-10 MED ORDER — COLCHICINE 0.6 MG PO TABS: 0.6000 mg | ORAL_TABLET | Freq: Every day | ORAL | 1 refills | Status: DC | PRN

## 2019-09-10 NOTE — Assessment & Plan Note (Signed)
Stable control on no med.

## 2019-09-10 NOTE — Assessment & Plan Note (Signed)
At goal on statin at last check.

## 2019-09-10 NOTE — Assessment & Plan Note (Signed)
Well controlled. Continue current medication.  

## 2019-09-10 NOTE — Progress Notes (Signed)
Chief Complaint  Patient presents with  . Diabetes  . Hypertension    History of Present Illness: HPI    58 year old male wheelchair bound presents for follow up diabetes.  Diabetes:   Worsened control of Diabetes On metformin 1000 mg daily.. compliant with taking it  Very poor diet over the holidays. Still staying away from soda. He  Is not able to exercise. 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   Hypertension:   Improved Bp control on HCTZ, lisinopril 10 mg daily BP Readings from Last 3 Encounters:  09/10/19 101/65  06/13/19 (!) 167/83  03/18/19 (!) 98/58  Using medication without problems or lightheadedness:  Chest pain with exertion: Edema: Short of breath: Average home BPs: Other issues:  LDL at goal  < 70 ( hx of CVA)on crestor at last check.   Constipation, chronic: poor cotnrol.,   Cardiomegaly seen on CXR: ECHO normal in 07/2019 This visit occurred during the SARS-CoV-2 public health emergency.  Safety protocols were in place, including screening questions prior to the visit, additional usage of staff PPE, and extensive cleaning of exam room while observing appropriate contact time as indicated for disinfecting solutions.   COVID 19 screen:  No recent travel or known exposure to COVID19 The patient denies respiratory symptoms of COVID 19 at this time. The importance of social distancing was discussed today.     ROS    Past Medical History:  Diagnosis Date  . Arthritis   . Depression   . Diabetes mellitus without complication ([place])   . Frequent headaches   . Gout   . Hyperlipidemia   . Hypertension   . Stroke [facility])     reports that he quit smoking about 30 years ago. He has never used smokeless tobacco. He reports current alcohol use. He reports that he does not use drugs.   Current Outpatient Medications:  .  acetaminophen (TYLENOL) 325 MG tablet, Take 325 mg by mouth every 4 (four) hours as needed. , Disp: , Rfl:  .  aspirin 81 MG tablet, Take 81 mg by mouth daily., Disp: , Rfl:  .  blood glucose meter kit and supplies, Dispense based on patient and insurance preference. Use to check blood sugar two times a day., Disp: 1 each, Rfl: 0 .  colchicine 0.6 MG tablet, Take 0.6 mg by mouth daily. , Disp: , Rfl:  .  gabapentin (NEURONTIN) 400 MG capsule, Take 400 mg by mouth daily as needed., Disp: , Rfl:  .  hydrochlorothiazide (HYDRODIURIL) 25 MG tablet, Take 1 tablet (25 mg total) by mouth daily., Disp: 90 tablet, Rfl: 1 .  lisinopril (ZESTRIL) 10 MG tablet, Take 0.5 tablets (5 mg total) by mouth daily., Disp: 45 tablet, Rfl: 1 .  metFORMIN (GLUCOPHAGE-XR) 500 MG 24 hr tablet, Take 2 tablets (1,000 mg total) by mouth daily with breakfast., Disp: 180 tablet, Rfl: 1 .  methocarbamol (ROBAXIN) 750 MG tablet, Take 750 mg by mouth daily as needed for muscle spasms., Disp: , Rfl:  .  polyethylene glycol (MIRALAX / GLYCOLAX) packet, Take 17 g by mouth daily., Disp: 14 each, Rfl: 0 .  rosuvastatin (CRESTOR) 20 MG tablet, Take 1 tablet (20 mg total) by  mouth at bedtime., Disp: 90 tablet, Rfl: 2   Observations/Objective: Blood pressure 101/65, pulse 75, temperature 98.3 F (36.8 C), temperature source Temporal, height 6' (1.829 m), weight (!) 314 lb 8 oz (142.7 kg), SpO2 97 %.  Physical Exam Constitutional:      Appearance: He is well-developed. He is obese.  HENT:     Head: Normocephalic.     Right Ear: Hearing normal.     Left Ear: Hearing normal.     Nose: Nose normal.  Neck:     Thyroid: No thyroid mass or thyromegaly.     Vascular: No carotid bruit.     Trachea: Trachea normal.  Cardiovascular:     Rate and  Rhythm: Normal rate and regular rhythm.     Pulses: Normal pulses.     Heart sounds: Heart sounds not distant. No murmur. No friction rub. No gallop.      Comments: No peripheral edema Pulmonary:     Effort: Pulmonary effort is normal. No respiratory distress.     Breath sounds: Normal breath sounds.  Skin:    General: Skin is warm and dry.     Findings: No rash.  Neurological:     Sensory: Sensory deficit present.     Motor: Weakness present.     Comments: In lower extremeties bilaterally.  Psychiatric:        Speech: Speech normal.        Behavior: Behavior normal.        Thought Content: Thought content normal.     Diabetic foot exam: Normal inspection No skin breakdown No calluses  Normal DP pulses No sensation to light touch and monofilament Nails normal   Assessment and Plan   Diabetes mellitus type 2, uncontrolled, with complications (HCC) Poor control... increase to max metfomrin. Consider trulicity if not at goal at next OV.  Chronic constipation Start Benefiber, miralax daily, increase water.  Increase in metfomrin may help as well.  Hyperlipidemia associated with type 2 diabetes mellitus ([place]) At goal on statin at last check.  Morbid obesity ([place]) Encouraged exercise, weight loss, healthy eating habits.   Hypertension associated with diabetes ([place]) Well controlled. Continue current medication.   MDD (recurrent major depressive disorder) in remission ([place]) Stable control on no med.     [name], MD

## 2019-09-10 NOTE — Assessment & Plan Note (Signed)
Start Benefiber, miralax daily, increase water.  Increase in metfomrin may help as well.

## 2019-09-10 NOTE — Patient Instructions (Addendum)
Increase metformin to 2 tabs twice daily.  Start Benefiber. Increasing water.  Work on low [employer].  Can miralax daily.  Check blood sugars daily in AMs.. Goal fasting is < 120  Start groin stretches.

## 2019-09-10 NOTE — Assessment & Plan Note (Signed)
Poor control... increase to max metfomrin. Consider trulicity if not at goal at next OV.

## 2019-09-10 NOTE — Assessment & Plan Note (Signed)
Encouraged exercise, weight loss, healthy eating habits. ? ?

## 2019-12-01 ENCOUNTER — Telehealth: Payer: Self-pay | Admitting: Family Medicine

## 2019-12-01 DIAGNOSIS — IMO0002 Reserved for concepts with insufficient information to code with codable children: Secondary | ICD-10-CM

## 2019-12-01 DIAGNOSIS — M1A0710 Idiopathic chronic gout, right ankle and foot, without tophus (tophi): Secondary | ICD-10-CM

## 2019-12-01 DIAGNOSIS — E785 Hyperlipidemia, unspecified: Secondary | ICD-10-CM

## 2019-12-01 DIAGNOSIS — Z125 Encounter for screening for malignant neoplasm of prostate: Secondary | ICD-10-CM

## 2019-12-01 DIAGNOSIS — E1165 Type 2 diabetes mellitus with hyperglycemia: Secondary | ICD-10-CM

## 2019-12-01 NOTE — Telephone Encounter (Signed)
-----   Message from Ellamae Sia sent at 11/19/2019 12:05 PM EST ----- Regarding: lab orders for Tuesday, 3.23.21 Patient is scheduled for CPX labs, please order future labs, Thanks , Karna Christmas

## 2019-12-02 ENCOUNTER — Other Ambulatory Visit: Payer: Self-pay

## 2019-12-02 ENCOUNTER — Other Ambulatory Visit (INDEPENDENT_AMBULATORY_CARE_PROVIDER_SITE_OTHER): Payer: Medicaid Other

## 2019-12-02 DIAGNOSIS — E118 Type 2 diabetes mellitus with unspecified complications: Secondary | ICD-10-CM

## 2019-12-02 DIAGNOSIS — E1165 Type 2 diabetes mellitus with hyperglycemia: Secondary | ICD-10-CM

## 2019-12-02 DIAGNOSIS — IMO0002 Reserved for concepts with insufficient information to code with codable children: Secondary | ICD-10-CM

## 2019-12-02 LAB — COMPREHENSIVE METABOLIC PANEL
ALT: 15 U/L (ref 0–53)
AST: 14 U/L (ref 0–37)
Albumin: 4.2 g/dL (ref 3.5–5.2)
Alkaline Phosphatase: 78 U/L (ref 39–117)
BUN: 15 mg/dL (ref 6–23)
CO2: 31 mEq/L (ref 19–32)
Calcium: 9.2 mg/dL (ref 8.4–10.5)
Chloride: 98 mEq/L (ref 96–112)
Creatinine, Ser: 0.98 mg/dL (ref 0.40–1.50)
GFR: 78.27 mL/min (ref >60.00)
Glucose, Bld: 244 mg/dL — ABNORMAL HIGH (ref 70–99)
Potassium: 4 mEq/L (ref 3.5–5.1)
Sodium: 136 mEq/L (ref 135–145)
Total Bilirubin: 0.8 mg/dL (ref 0.2–1.2)
Total Protein: 7 g/dL (ref 6.0–8.3)

## 2019-12-02 LAB — LIPID PANEL
Cholesterol: 147 mg/dL (ref 0–200)
HDL: 41 mg/dL (ref >39.00)
NonHDL: 105.75
Total CHOL/HDL Ratio: 4
Triglycerides: 242 mg/dL — ABNORMAL HIGH (ref 0.0–149.0)
VLDL: 48.4 mg/dL — ABNORMAL HIGH (ref 0.0–40.0)

## 2019-12-02 LAB — LDL CHOLESTEROL, DIRECT: Direct LDL: 79 mg/dL

## 2019-12-02 LAB — HEMOGLOBIN A1C: Hgb A1c MFr Bld: 10.9 % — ABNORMAL HIGH (ref 4.6–6.5)

## 2019-12-02 NOTE — Progress Notes (Signed)
No critical labs need to be addressed urgently. We will discuss labs in detail at upcoming office visit.   

## 2019-12-09 ENCOUNTER — Other Ambulatory Visit: Payer: Self-pay

## 2019-12-09 ENCOUNTER — Ambulatory Visit (INDEPENDENT_AMBULATORY_CARE_PROVIDER_SITE_OTHER): Payer: Medicaid Other | Admitting: Family Medicine

## 2019-12-09 ENCOUNTER — Encounter: Payer: Self-pay | Admitting: Family Medicine

## 2019-12-09 VITALS — BP 126/78 | HR 75 | Temp 98.2°F | Ht 72.0 in | Wt 317.2 lb

## 2019-12-09 DIAGNOSIS — K5909 Other constipation: Secondary | ICD-10-CM

## 2019-12-09 DIAGNOSIS — Z0000 Encounter for general adult medical examination without abnormal findings: Secondary | ICD-10-CM | POA: Diagnosis not present

## 2019-12-09 DIAGNOSIS — E118 Type 2 diabetes mellitus with unspecified complications: Secondary | ICD-10-CM | POA: Diagnosis not present

## 2019-12-09 DIAGNOSIS — E1143 Type 2 diabetes mellitus with diabetic autonomic (poly)neuropathy: Secondary | ICD-10-CM

## 2019-12-09 DIAGNOSIS — M1A0710 Idiopathic chronic gout, right ankle and foot, without tophus (tophi): Secondary | ICD-10-CM

## 2019-12-09 DIAGNOSIS — E785 Hyperlipidemia, unspecified: Secondary | ICD-10-CM

## 2019-12-09 DIAGNOSIS — E1159 Type 2 diabetes mellitus with other circulatory complications: Secondary | ICD-10-CM

## 2019-12-09 DIAGNOSIS — I10 Essential (primary) hypertension: Secondary | ICD-10-CM

## 2019-12-09 DIAGNOSIS — E1165 Type 2 diabetes mellitus with hyperglycemia: Secondary | ICD-10-CM

## 2019-12-09 DIAGNOSIS — F3340 Major depressive disorder, recurrent, in remission, unspecified: Secondary | ICD-10-CM

## 2019-12-09 DIAGNOSIS — G834 Cauda equina syndrome: Secondary | ICD-10-CM

## 2019-12-09 DIAGNOSIS — IMO0002 Reserved for concepts with insufficient information to code with codable children: Secondary | ICD-10-CM

## 2019-12-09 NOTE — Progress Notes (Signed)
Chief Complaint  Patient presents with  . Annual Exam    History of Present Illness: HPI   The patient is here for annual wellness exam and preventative care.    Hx of cauda equina  In wheelchair. Short distance using a walker.  Left ear feels like something is moving in there,  Tickling. Has tried scraping  with bobby pin. No pain, no dishcarge  Hypertension:    At goal on  HCTZ and lisinopril BP Readings from Last 3 Encounters:  12/09/19 126/78  09/10/19 101/65  06/13/19 (!) 167/83  Using medication without problems or lightheadedness: none Chest pain with exertion:none Edema:none Short of breath:none Average home BPs: good control Other issues:  Wt Readings from Last 3 Encounters:  12/09/19 (!) 317 lb 4 oz (143.9 kg)  09/10/19 (!) 314 lb 8 oz (142.7 kg)  02/11/19 (!) 305 lb (138.3 kg)     Diabetes:   Inadequate but improving control with metformin max Lab Results  Component Value Date   HGBA1C 10.9 (H) 12/02/2019  Using medications without difficulties: Hypoglycemic episodes: Hyperglycemic episodes: Feet problems: no ulcers Blood Sugars averaging:  eye exam within last year: due  Elevated Cholesterol:  Stable control on crestor, LDL  Almost at goal < 70. Lab Results  Component Value Date   CHOL 147 12/02/2019   HDL 41.00 12/02/2019   LDLCALC 74 06/10/2019   LDLDIRECT 79.0 12/02/2019   TRIG 242.0 (H) 12/02/2019   CHOLHDL 4 12/02/2019  Using medications without problems: Muscle aches:  Diet compliance: improving, tryint ot decrease carbs in general. Exercise: getting more with walker Other complaints:   chronic constipation: on miralax daily.  MDD  Stable control on no med. PHQ2 today : 0   Neuropathic pain on gabapentin  Gout   No recent flare. Last 4 month or more ago. He is on HCTZ, this mayneed to be stopped.Using colchicine prn. Lab Results  Component Value Date   LABURIC 6.7 06/10/2019     This visit occurred during the SARS-CoV-2  public health emergency.  Safety protocols were in place, including screening questions prior to the visit, additional usage of staff PPE, and extensive cleaning of exam room while observing appropriate contact time as indicated for disinfecting solutions.   COVID 19 screen:  No recent travel or known exposure to COVID19 The patient denies respiratory symptoms of COVID 19 at this time. The importance of social distancing was discussed today.     Review of Systems  Constitutional: Negative for chills and fever.  HENT: Negative for congestion and ear pain.   Eyes: Negative for pain and redness.  Respiratory: Negative for cough and shortness of breath.   Cardiovascular: Negative for chest pain, palpitations and leg swelling.  Gastrointestinal: Negative for abdominal pain, blood in stool, constipation, diarrhea, nausea and vomiting.  Genitourinary: Negative for dysuria.  Musculoskeletal: Negative for falls and myalgias.  Skin: Negative for rash.  Neurological: Negative for dizziness.  Psychiatric/Behavioral: Negative for depression. The patient is not nervous/anxious.       Past Medical History:  Diagnosis Date  . Arthritis   . Depression   . Diabetes mellitus without complication ([place])   . Frequent headaches   . Gout   . Hyperlipidemia   . Hypertension   . Stroke [facility])     reports that he quit smoking about 30 years ago. He has never used smokeless tobacco. He reports current alcohol use. He reports that he does not use drugs.   Current Outpatient Medications:  .  acetaminophen (TYLENOL) 325 MG tablet, Take 325 mg by mouth every 4 (four) hours as needed. , Disp: , Rfl:  .  aspirin 81 MG tablet, Take 81 mg by mouth daily., Disp: , Rfl:  .  blood glucose meter kit and supplies, Dispense based on patient and insurance preference. Use to check blood sugar two times a day., Disp: 1 each, Rfl: 0 .  Cholecalciferol (D3-1000 PO), Take 5 tablets by mouth daily., Disp: , Rfl:  .   colchicine 0.6 MG tablet, Take 1 tablet (0.6 mg total) by mouth daily as needed., Disp: 30 tablet, Rfl: 1 .  gabapentin (NEURONTIN) 400 MG capsule, Take 400 mg by mouth daily as needed., Disp: , Rfl:  .  hydrochlorothiazide (HYDRODIURIL) 25 MG tablet, Take 1 tablet (25 mg total) by mouth daily., Disp: 90 tablet, Rfl: 1 .  lisinopril (ZESTRIL) 10 MG tablet, Take 0.5 tablets (5 mg total) by mouth daily., Disp: 45 tablet, Rfl: 1 .  metFORMIN (GLUCOPHAGE-XR) 500 MG 24 hr tablet, Take 1,000 mg by mouth 2 (two) times daily., Disp: , Rfl:  .  methocarbamol (ROBAXIN) 750 MG tablet, Take 1 tablet (750 mg total) by mouth daily as needed for muscle spasms., Disp: 30 tablet, Rfl: 1 .  Multiple Vitamins-Minerals (ZINC PO), Take 1 tablet by mouth daily., Disp: , Rfl:  .  polyethylene glycol (MIRALAX / GLYCOLAX) packet, Take 17 g by mouth daily., Disp: 14 each, Rfl: 0 .  rosuvastatin (CRESTOR) 20 MG tablet, Take 1 tablet (20 mg total) by mouth at bedtime., Disp: 90 tablet, Rfl: 2   Observations/Objective: Blood pressure 126/78, pulse 75, temperature 98.2 F (36.8 C), temperature source Temporal, height 6' (1.829 m), weight (!) 317 lb 4 oz (143.9 kg), SpO2 96 %.  Physical Exam Constitutional:      Appearance: He is well-developed. He is obese.  HENT:     Head: Normocephalic.     Right Ear: Hearing normal.     Left Ear: Hearing normal.     Nose: Nose normal.  Neck:     Thyroid: No thyroid mass or thyromegaly.     Vascular: No carotid bruit.     Trachea: Trachea normal.  Cardiovascular:     Rate and Rhythm: Normal rate and regular rhythm.     Pulses: Normal pulses.     Heart sounds: Heart sounds not distant. No murmur. No friction rub. No gallop.      Comments: No peripheral edema Pulmonary:     Effort: Pulmonary effort is normal. No respiratory distress.     Breath sounds: Normal breath sounds.  Skin:    General: Skin is warm and dry.     Findings: No rash.  Neurological:     Sensory: Sensory  deficit present.     Motor: Weakness present.     Comments: In lower extremeties bilaterally.  Psychiatric:        Speech: Speech normal.        Behavior: Behavior normal.        Thought Content: Thought content normal.      Diabetic foot exam: Normal inspection No skin breakdown No calluses  Normal DP pulses Normal sensation to light touch and monofilament Nails normal   Assessment and Plan   The patient's preventative maintenance and recommended screening tests for an annual wellness exam were reviewed in full today. Brought up to date unless services declined.  Counselled on the importance of diet, exercise, and its role in overall health and mortality. The  patient's FH and SH was reviewed, including their home life, tobacco status, and drug and alcohol status.   Lab Results  Component Value Date   PSA 0.32 06/10/2019   PSA 0.41 06/20/2018   PSA 0.5 07/17/2017   Vaccine:  Due for Td and PNA: Discussed COVID19 vaccine side effects and benefits. Strongly encouraged the patient to get the vaccine. Questions answered. Colon:  Non smoker:  Hypertension associated with diabetes ([place]) Well controlled. Continue current medication.   Morbid obesity ([place]) Encouraged exercise, weight loss, healthy eating habits.   Diabetes mellitus type 2, uncontrolled, with complications (HCC) Improving control on metfomrin max. Associated with peripheral nueropathy.  Peripheral autonomic neuropathy due to diabetes mellitus ([place]) Stable control at this time. At least in part due to DM and well as past back issues.  MDD (recurrent major depressive disorder) in remission ([place]) Stable control on no med.  Cauda equina syndrome with neurogenic bladder (HCC) Stable control, working on home PT. Uses wheelchair with upper body for mobility.  Chronic constipation Good control on miralax daily.  Gout of right foot No recent flare. Last 4 month or more ago. He is on HCTZ, this mayneed to be  stopped.Using colchicine prn.      [name], MD

## 2019-12-09 NOTE — Patient Instructions (Addendum)
Work on healthy eating habit, increase activity and  Low carb diet.  Stop sweet beverages. Get back to water exercise when able. Set up yearly eye exam when able.  Consider tetanus, pneumonia vaccine.  Call medicaid regarding colon cancer screening and possible colonoscopy.

## 2019-12-18 ENCOUNTER — Other Ambulatory Visit: Payer: Self-pay | Admitting: Family Medicine

## 2019-12-31 DIAGNOSIS — Z0100 Encounter for examination of eyes and vision without abnormal findings: Secondary | ICD-10-CM

## 2019-12-31 DIAGNOSIS — E119 Type 2 diabetes mellitus without complications: Secondary | ICD-10-CM

## 2019-12-31 DIAGNOSIS — Z1211 Encounter for screening for malignant neoplasm of colon: Secondary | ICD-10-CM

## 2020-01-07 NOTE — Assessment & Plan Note (Signed)
No recent flare. Last 4 month or more ago. He is on HCTZ, this mayneed to be stopped.Using colchicine prn.

## 2020-01-07 NOTE — Assessment & Plan Note (Signed)
Stable control, working on home PT. Uses wheelchair with upper body for mobility.

## 2020-01-07 NOTE — Assessment & Plan Note (Signed)
Encouraged exercise, weight loss, healthy eating habits. ? ?

## 2020-01-07 NOTE — Assessment & Plan Note (Signed)
Well controlled. Continue current medication.  

## 2020-01-07 NOTE — Assessment & Plan Note (Signed)
Good control on miralax daily.

## 2020-01-07 NOTE — Assessment & Plan Note (Signed)
Improving control on metfomrin max. Associated with peripheral nueropathy.

## 2020-01-07 NOTE — Assessment & Plan Note (Signed)
Stable control on no med.

## 2020-01-07 NOTE — Assessment & Plan Note (Signed)
Stable control at this time. At least in part due to DM and well as past back issues.

## 2020-01-15 ENCOUNTER — Other Ambulatory Visit: Payer: Self-pay

## 2020-01-15 ENCOUNTER — Telehealth (INDEPENDENT_AMBULATORY_CARE_PROVIDER_SITE_OTHER): Payer: Self-pay | Admitting: Gastroenterology

## 2020-01-15 DIAGNOSIS — Z1211 Encounter for screening for malignant neoplasm of colon: Secondary | ICD-10-CM

## 2020-01-15 NOTE — Progress Notes (Signed)
Gastroenterology Pre-Procedure Review  Request Date: 01/27/20 Requesting Physician: Dr. Vicente Males  PATIENT REVIEW QUESTIONS: The patient responded to the following health history questions as indicated:    1. Are you having any GI issues? yes (constipation as a result of back surgery takes fiber supplements) 2. Do you have a personal history of Polyps? no 3. Do you have a family history of Colon Cancer or Polyps? no 4. Diabetes Mellitus? yes (type 2 oral meds) 5. Joint replacements in the past 12 months?no 6. Major health problems in the past 3 months?no 7. Any artificial heart valves, MVP, or defibrillator?no    MEDICATIONS & ALLERGIES:    Patient reports the following regarding taking any anticoagulation/antiplatelet therapy:   Plavix, Coumadin, Eliquis, Xarelto, Lovenox, Pradaxa, Brilinta, or Effient? yes (81 mg daily) Aspirin? yes (81 mg daily)  Patient confirms/reports the following medications:  Current Outpatient Medications  Medication Sig Dispense Refill  . acetaminophen (TYLENOL) 325 MG tablet Take 325 mg by mouth every 4 (four) hours as needed.     Marland Kitchen aspirin 81 MG tablet Take 81 mg by mouth daily.    . blood glucose meter kit and supplies Dispense based on patient and insurance preference. Use to check blood sugar two times a day. 1 each 0  . Cholecalciferol (D3-1000 PO) Take 5 tablets by mouth daily.    . colchicine 0.6 MG tablet Take 1 tablet (0.6 mg total) by mouth daily as needed. 30 tablet 1  . gabapentin (NEURONTIN) 400 MG capsule Take 400 mg by mouth daily as needed.    . hydrochlorothiazide (HYDRODIURIL) 25 MG tablet Take 1 tablet (25 mg total) by mouth daily. 90 tablet 1  . lisinopril (ZESTRIL) 10 MG tablet Take 0.5 tablets (5 mg total) by mouth daily. 45 tablet 1  . metFORMIN (GLUCOPHAGE-XR) 500 MG 24 hr tablet Take 2 tablets (1,000 mg total) by mouth in the morning and at bedtime. 360 tablet 1  . methocarbamol (ROBAXIN) 750 MG tablet Take 1 tablet (750 mg total) by  mouth daily as needed for muscle spasms. 30 tablet 1  . Multiple Vitamins-Minerals (ZINC PO) Take 1 tablet by mouth daily.    . polyethylene glycol (MIRALAX / GLYCOLAX) packet Take 17 g by mouth daily. 14 each 0  . rosuvastatin (CRESTOR) 20 MG tablet Take 1 tablet (20 mg total) by mouth at bedtime. 90 tablet 2   No current facility-administered medications for this visit.    Patient confirms/reports the following allergies:  No Known Allergies  No orders of the defined types were placed in this encounter.   AUTHORIZATION INFORMATION Primary Insurance: 1D#: Group #:  Secondary Insurance: 1D#: Group #:  SCHEDULE INFORMATION: Date: 01/27/20 Time: Location:[facility]

## 2020-01-16 ENCOUNTER — Ambulatory Visit: Payer: Medicaid Other | Admitting: Family Medicine

## 2020-01-16 ENCOUNTER — Other Ambulatory Visit: Payer: Self-pay

## 2020-01-16 ENCOUNTER — Encounter: Payer: Self-pay | Admitting: Family Medicine

## 2020-01-16 VITALS — BP 112/64 | HR 90 | Temp 97.8°F | Ht 72.0 in | Wt 317.5 lb

## 2020-01-16 DIAGNOSIS — G834 Cauda equina syndrome: Secondary | ICD-10-CM | POA: Diagnosis not present

## 2020-01-16 DIAGNOSIS — Z8673 Personal history of transient ischemic attack (TIA), and cerebral infarction without residual deficits: Secondary | ICD-10-CM | POA: Diagnosis not present

## 2020-01-16 DIAGNOSIS — Z7689 Persons encountering health services in other specified circumstances: Secondary | ICD-10-CM | POA: Diagnosis not present

## 2020-01-16 NOTE — Progress Notes (Addendum)
Chief Complaint  Patient presents with  . Mobility Accessment    for electric wheelchair    History of Present Illness: HPI  59 year old male with history of   Chronic low back pain resulting in cauda equina. He  Had open discectomy and decompression in 2019 but continued to have symptoms of cauda equina. The cauda equina caused neurogenic bladder as well as partial paralysis in lower legs. He also has chronic neuropathic pain and in his legs from diabetes mellitus type 2. 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 He has history of CVA in 2017 with dysarthria, facial numbness and  right arm weakness... symptoms gradually improved over time.   He has medicaid.   He cannot walk, or step without holding on to something. He can only stand 1 minute without support. He has no sensation to touch for mid calf down. No flexion and extension in right ankle, has foot drop. He has no proprioception in legs, his balance is very poor. He has frequent falls from poor balance.  He has a 20 foot long ramp to his door... he cannot manually propel himself up the ramp. Given this issue,  it is very difficult  to enter and leave his home.  He has no other family that lives with him. He has none to assist him.  An electric wheel chair is  is necessary to perform ADLS at home including getting to bathroom to toilet, getting to kitchen to eat and cooking, getting to bedroom to dress etc.  He cannot use a cane/walker given his poor balance and inability to stand for > 1 min.  His arms are too weak to propel an manual wheelchair, he fatigues quickly, has shoulder stiffness and cannot move well enough to perform ADLs.  He can operate an IT trainer wheelchair safely, both mentally and physically.  He is willing and motivated to use the device.Marland Kitchen He would use it daily for ADLs.  He is a 6 foot, 317 lb male.. he needs a bariatric electric wheelchair. The electric wheel chair sits higher  and would allow him to reach his counters better to do ADLs of feeding, cooking and toileting.  This visit occurred during the SARS-CoV-2 public health emergency.  Safety protocols were in place, including screening questions prior to the visit, additional usage of staff PPE, and extensive cleaning of exam room while observing appropriate contact time as indicated for disinfecting solutions.   COVID 19 screen:  No recent travel or known exposure to COVID19 The patient denies respiratory symptoms of COVID 19 at this time. The importance of social distancing was discussed today.     Review of Systems  Constitutional: Negative for chills and fever.  HENT: Negative for congestion and ear pain.   Eyes: Negative for pain and redness.  Respiratory: Negative for cough and shortness of breath.   Cardiovascular: Negative for chest pain, palpitations and leg swelling.  Gastrointestinal: Negative for abdominal pain, blood in stool, constipation, diarrhea, nausea and vomiting.  Genitourinary: Negative for dysuria.  Musculoskeletal: Positive for back pain and falls. Negative for myalgias.  Skin: Negative for rash.  Neurological: Positive for weakness. Negative for dizziness.  Psychiatric/Behavioral: Negative for depression. The patient is not nervous/anxious.       Past Medical History:  Diagnosis Date  . Arthritis   . Depression   . Diabetes mellitus without complication ([place])   . Frequent headaches   . Gout   . Hyperlipidemia   .  Hypertension   . Stroke [facility])     reports that he quit smoking about 30 years ago. He has never used smokeless tobacco. He reports current alcohol use. He reports that he does not use drugs.   Current Outpatient Medications:  .  acetaminophen (TYLENOL) 325 MG tablet, Take 325 mg by mouth every 4 (four) hours as needed. , Disp: , Rfl:  .  aspirin 81 MG tablet, Take 81 mg by mouth daily., Disp: , Rfl:  .  blood glucose meter kit and supplies, Dispense based on  patient and insurance preference. Use to check blood sugar two times a day., Disp: 1 each, Rfl: 0 .  Cholecalciferol (D3-1000 PO), Take 5 tablets by mouth daily., Disp: , Rfl:  .  colchicine 0.6 MG tablet, Take 1 tablet (0.6 mg total) by mouth daily as needed., Disp: 30 tablet, Rfl: 1 .  gabapentin (NEURONTIN) 400 MG capsule, Take 400 mg by mouth daily as needed., Disp: , Rfl:  .  hydrochlorothiazide (HYDRODIURIL) 25 MG tablet, Take 1 tablet (25 mg total) by mouth daily., Disp: 90 tablet, Rfl: 1 .  lisinopril (ZESTRIL) 10 MG tablet, Take 0.5 tablets (5 mg total) by mouth daily., Disp: 45 tablet, Rfl: 1 .  metFORMIN (GLUCOPHAGE-XR) 500 MG 24 hr tablet, Take 2 tablets (1,000 mg total) by mouth in the morning and at bedtime., Disp: 360 tablet, Rfl: 1 .  methocarbamol (ROBAXIN) 750 MG tablet, Take 1 tablet (750 mg total) by mouth daily as needed for muscle spasms., Disp: 30 tablet, Rfl: 1 .  Multiple Vitamins-Minerals (ZINC PO), Take 1 tablet by mouth daily., Disp: , Rfl:  .  polyethylene glycol (MIRALAX / GLYCOLAX) packet, Take 17 g by mouth daily., Disp: 14 each, Rfl: 0 .  rosuvastatin (CRESTOR) 20 MG tablet, Take 1 tablet (20 mg total) by mouth at bedtime., Disp: 90 tablet, Rfl: 2   Observations/Objective: Blood pressure 112/64, pulse 90, temperature 97.8 F (36.6 C), temperature source Temporal, height 6' (1.829 m), weight (!) 317 lb 8 oz (144 kg), SpO2 97 %.  Physical Exam Constitutional:      Appearance: He is well-developed. He is obese.  HENT:     Head: Normocephalic.     Right Ear: Hearing normal.     Left Ear: Hearing normal.     Nose: Nose normal.  Neck:     Thyroid: No thyroid mass or thyromegaly.     Vascular: No carotid bruit.     Trachea: Trachea normal.  Cardiovascular:     Rate and Rhythm: Normal rate and regular rhythm.     Pulses: Normal pulses.     Heart sounds: Heart sounds not distant. No murmur. No friction rub. No gallop.      Comments: No peripheral  edema Pulmonary:     Effort: Pulmonary effort is normal. No respiratory distress.     Breath sounds: Normal breath sounds.  Musculoskeletal:     Right shoulder: No tenderness or bony tenderness. Decreased range of motion.     Left shoulder: No tenderness or bony tenderness. Decreased range of motion.     Right upper arm: Normal.     Left upper arm: Normal.     Right wrist: Normal. No tenderness. Normal range of motion.     Left wrist: Normal. No tenderness. Normal range of motion.     Right hand: Normal range of motion.     Left hand: Normal range of motion.     Cervical back: Normal.  Thoracic back: Normal.     Lumbar back: Tenderness present. No bony tenderness. Decreased range of motion.     Right hip: No tenderness. Decreased range of motion. Decreased strength.     Left hip: No tenderness. Decreased range of motion. Decreased strength.     Right upper leg: Normal.     Left upper leg: Normal.     Right knee: Deformity present. Decreased range of motion.     Left knee: Deformity present. Decreased range of motion.     Right ankle: Swelling present. Tenderness present.     Left ankle: Swelling present. Tenderness present.     Comments:  Stiffness in bilateral shoulders   1 plus pititng edema in bialteral legs, chronic venous stasis changes in bilaterla lower legs  Skin:    General: Skin is warm and dry.     Findings: No rash.  Neurological:     Mental Status: He is alert and oriented to person, place, and time.     Cranial Nerves: Cranial nerves are intact.     Sensory: Sensory deficit present.     Motor: Weakness and atrophy present.     Coordination: Coordination abnormal. Heel to Shin Test abnormal.     Gait: Gait abnormal.     Comments: Right upper ext: 5/5 strength through out but decreased ROM and stiffness of shoulders Lower Ext: Strength: Hip flexors  L 3/5 R 4/5 Knee flex/ext  B 4/5 Ankle flex /ext R 0/5, L 3/5 When standing without supporting himself he has  immediate loss of balance, wobbles and has to brace himself  In office can only stand for less than 1 minute  Cannot take a step without support. Attempting to use walker given foot drop and  lack of proprioception.. can walk 10 feet but then has to sit,  unsafely weaving side to side, tripping despite walker use.  Psychiatric:        Speech: Speech normal.        Behavior: Behavior normal.        Thought Content: Thought content normal.      Assessment and Plan  MD is referring patient to for a PT Mobility/Seating Evaluation for Power Wheelchair. (This note  addended due to medicare requirements on 01/20/2020 to add the proceeding statement)  He is an excellent candidate for an electric wheelchair given his disability from cauda equina syndrome resulting in neurogenic bladder, decreased strength , sensation an d proprioception changes in bilateral lower extremities. See HPI for full requirements.   [name], MD

## 2020-01-23 ENCOUNTER — Other Ambulatory Visit
Admission: RE | Admit: 2020-01-23 | Discharge: 2020-01-23 | Disposition: A | Discharge status: Home / Self Care | Payer: Medicaid Other | Source: Ambulatory Visit | Attending: Gastroenterology | Admitting: Gastroenterology

## 2020-01-23 ENCOUNTER — Other Ambulatory Visit: Payer: Self-pay

## 2020-01-23 DIAGNOSIS — Z20822 Contact with and (suspected) exposure to covid-19: Secondary | ICD-10-CM | POA: Insufficient documentation

## 2020-01-23 DIAGNOSIS — Z01812 Encounter for preprocedural laboratory examination: Secondary | ICD-10-CM | POA: Diagnosis not present

## 2020-01-23 LAB — SARS CORONAVIRUS 2 (TAT 6-24 HRS): SARS Coronavirus 2: NEGATIVE

## 2020-01-26 ENCOUNTER — Other Ambulatory Visit: Payer: Self-pay

## 2020-01-26 DIAGNOSIS — Z1211 Encounter for screening for malignant neoplasm of colon: Secondary | ICD-10-CM

## 2020-01-26 MED ORDER — PEG 3350-KCL-NA BICARB-NACL 420 G PO SOLR: 4000.0000 mL | Freq: Once | ORAL | 0 refills | Status: AC

## 2020-01-27 ENCOUNTER — Other Ambulatory Visit: Payer: Self-pay

## 2020-01-27 ENCOUNTER — Encounter
Admission: RE | Disposition: A | Discharge status: Home / Self Care | Payer: Self-pay | Source: Home / Self Care | Attending: Gastroenterology

## 2020-01-27 ENCOUNTER — Encounter: Payer: Self-pay | Admitting: Gastroenterology

## 2020-01-27 ENCOUNTER — Ambulatory Visit: Payer: Medicaid Other | Admitting: Anesthesiology

## 2020-01-27 ENCOUNTER — Ambulatory Visit
Admission: RE | Admit: 2020-01-27 | Discharge: 2020-01-27 | Disposition: A | Discharge status: Home / Self Care | Payer: Medicaid Other | Attending: Gastroenterology | Admitting: Gastroenterology

## 2020-01-27 DIAGNOSIS — Z7984 Long term (current) use of oral hypoglycemic drugs: Secondary | ICD-10-CM | POA: Insufficient documentation

## 2020-01-27 DIAGNOSIS — Z1211 Encounter for screening for malignant neoplasm of colon: Secondary | ICD-10-CM

## 2020-01-27 DIAGNOSIS — M109 Gout, unspecified: Secondary | ICD-10-CM | POA: Diagnosis not present

## 2020-01-27 DIAGNOSIS — I10 Essential (primary) hypertension: Secondary | ICD-10-CM | POA: Insufficient documentation

## 2020-01-27 DIAGNOSIS — E785 Hyperlipidemia, unspecified: Secondary | ICD-10-CM | POA: Insufficient documentation

## 2020-01-27 DIAGNOSIS — Z87891 Personal history of nicotine dependence: Secondary | ICD-10-CM | POA: Insufficient documentation

## 2020-01-27 DIAGNOSIS — Z7982 Long term (current) use of aspirin: Secondary | ICD-10-CM | POA: Diagnosis not present

## 2020-01-27 DIAGNOSIS — E119 Type 2 diabetes mellitus without complications: Secondary | ICD-10-CM | POA: Diagnosis not present

## 2020-01-27 DIAGNOSIS — Z8673 Personal history of transient ischemic attack (TIA), and cerebral infarction without residual deficits: Secondary | ICD-10-CM | POA: Insufficient documentation

## 2020-01-27 DIAGNOSIS — Z79899 Other long term (current) drug therapy: Secondary | ICD-10-CM | POA: Diagnosis not present

## 2020-01-27 DIAGNOSIS — M1990 Unspecified osteoarthritis, unspecified site: Secondary | ICD-10-CM | POA: Insufficient documentation

## 2020-01-27 HISTORY — PX: COLONOSCOPY WITH PROPOFOL: SHX5780

## 2020-01-27 LAB — GLUCOSE, CAPILLARY: Glucose-Capillary: 173 mg/dL — ABNORMAL HIGH (ref 70–99)

## 2020-01-27 SURGERY — COLONOSCOPY WITH PROPOFOL
Anesthesia: General

## 2020-01-27 MED ORDER — PROPOFOL 500 MG/50ML IV EMUL
INTRAVENOUS | Status: AC
  Filled 2020-01-27: qty 50

## 2020-01-27 MED ORDER — SODIUM CHLORIDE 0.9 % IV SOLN: INTRAVENOUS | Status: DC

## 2020-01-27 MED ORDER — PROPOFOL 500 MG/50ML IV EMUL
INTRAVENOUS | Status: DC | PRN
  Administered 2020-01-27: 150 ug/kg/min via INTRAVENOUS

## 2020-01-27 MED ORDER — PROPOFOL 10 MG/ML IV BOLUS
INTRAVENOUS | Status: DC | PRN
  Administered 2020-01-27: 50 mg via INTRAVENOUS

## 2020-01-27 NOTE — H&P (Signed)
Jamie Bellows, MD [address] [place], [place], [place], [address] [address], [place], [place], [place], [address] Phone: [phone]  Fax: [phone]  Primary Care Physician:  [name], MD   Pre-Procedure History & Physical: HPI:  Jamie Finley is a 59 y.o. male is here for an colonoscopy.   Past Medical History:  Diagnosis Date  . Arthritis   . Depression   . Diabetes mellitus without complication ([place])   . Frequent headaches   . Gout   . Hyperlipidemia   . Hypertension   . Stroke [facility])     Past Surgical History:  Procedure Laterality Date  . BACK SURGERY  2007   [facility]  . TONSILLECTOMY      Prior to Admission medications   Medication Sig Start Date End Date Taking? Authorizing Provider  acetaminophen (TYLENOL) 325 MG tablet Take 325 mg by mouth every 4 (four) hours as needed.    Yes [provider]  colchicine 0.6 MG tablet Take 1 tablet (0.6 mg total) by mouth daily as needed. 09/10/19  Yes Bedsole, Amy E, MD  gabapentin (NEURONTIN) 400 MG capsule Take 400 mg by mouth daily as needed.   Yes [provider]  hydrochlorothiazide (HYDRODIURIL) 25 MG tablet Take 1 tablet (25 mg total) by mouth daily. 09/02/19  Yes Bedsole, Amy E, MD  lisinopril (ZESTRIL) 10 MG tablet Take 0.5 tablets (5 mg total) by mouth daily. 09/02/19  Yes Bedsole, Amy E, MD  metFORMIN (GLUCOPHAGE-XR) 500 MG 24 hr tablet Take 2 tablets (1,000 mg total) by mouth in the morning and at bedtime. 12/18/19  Yes Bedsole, Amy E, MD  methocarbamol (ROBAXIN) 750 MG tablet Take 1 tablet (750 mg total) by mouth daily as needed for muscle spasms. 09/10/19  Yes Bedsole, Amy E, MD  aspirin 81 MG tablet Take 81 mg by mouth daily.    Roselee Nova, MD  blood glucose meter kit and supplies Dispense based on patient and insurance preference. Use to check blood sugar two times a day. 12/12/18   Bedsole, Amy E, MD  Cholecalciferol (D3-1000 PO) Take 5 tablets by mouth daily.     [provider]  Multiple Vitamins-Minerals (ZINC PO) Take 1 tablet by mouth daily.    [provider]  polyethylene glycol (MIRALAX / GLYCOLAX) packet Take 17 g by mouth daily. 06/22/18   Bettey Costa, MD  rosuvastatin (CRESTOR) 20 MG tablet Take 1 tablet (20 mg total) by mouth at bedtime. 09/02/19   [name], MD    Allergies as of 01/15/2020  . (No Known Allergies)    Family History  Problem Relation Age of Onset  . Cancer Mother        face and jaw  . Cancer Father        kidney  . Early death Father   . Diabetes Maternal Grandmother   . Depression Maternal Grandfather   . Diabetes Maternal Grandfather   . Hearing loss Maternal Grandfather     Social History   Socioeconomic History  . Marital status: Single    Spouse name: Not on file  . Number of children: Not on file  . Years of education: Not on file  . Highest education level: Not on file  Occupational History  . Not on file  Tobacco Use  . Smoking status: Former Smoker    Quit date: 09/11/1989    Years since quitting: 30.3  . Smokeless tobacco: Never Used  Substance and Sexual Activity  . Alcohol use: Yes    Comment: rarely   . Drug use: No  . Sexual activity: Not Currently  Other Topics Concern  . Not on file  Social History Narrative  . Not on file   Social Determinants of Health   Financial Resource Strain:   . Difficulty of Paying Living Expenses:   Food Insecurity:   . Worried About Charity fundraiser in the Last Year:   . Arboriculturist in the Last Year:   Transportation Needs:   . Film/video editor (Medical):   Marland Kitchen Lack of Transportation (Non-Medical):   Physical Activity:   . Days of Exercise per Week:   . Minutes of Exercise per Session:   Stress:   . Feeling of Stress :   Social Connections:   . Frequency of Communication with Friends and Family:   . Frequency of Social Gatherings with Friends and Family:   . Attends Religious Services:   . Active Member of  Clubs or Organizations:   . Attends Archivist Meetings:   Marland Kitchen Marital Status:   Intimate Partner Violence:   . Fear of Current or Ex-Partner:   . Emotionally Abused:   Marland Kitchen Physically Abused:   . Sexually Abused:     Review of Systems: See HPI, otherwise negative ROS  Physical Exam: BP 139/77   Pulse 70   Temp (!) 97.1 F (36.2 C) (Temporal)   Resp 20   Ht 6' (1.829 m)   Wt (!) 143.8 kg   SpO2 100%   BMI 42.99 kg/m  General:   Alert,  pleasant and cooperative in NAD Head:  Normocephalic and atraumatic. Neck:  Supple; no masses or thyromegaly. Lungs:  Clear throughout to auscultation, normal respiratory effort.    Heart:  +S1, +S2, Regular rate and rhythm, No edema. Abdomen:  Soft, nontender and nondistended. Normal bowel sounds, without guarding, and without rebound.   Neurologic:  Alert and  oriented x4;  grossly normal neurologically.  Impression/Plan: Jamie Finley is here for an colonoscopy to be performed for Screening colonoscopy average risk   Risks, benefits, limitations, and alternatives regarding  colonoscopy have been reviewed with the patient.  Questions have been answered.  All parties agreeable.   Jamie Bellows, MD  01/27/2020, 10:34 AM

## 2020-01-27 NOTE — Op Note (Signed)
[facility] Patient Name: Jamie Finley Procedure Date: 01/27/2020 10:37 AM MRN: [id number] Account #: [id number] Date of Birth: [birth year] Admit Type: Outpatient Age: 59 Room: [facility] ENDO ROOM 1 Gender: Male Note Status: Finalized Procedure:             Colonoscopy Indications:           Screening for colorectal malignant neoplasm Providers:             Jonathon Bellows MD, MD Referring MD:          [name] MD, MD (Referring MD) Medicines:             Monitored Anesthesia Care Complications:         No immediate complications. Procedure:             Pre-Anesthesia Assessment:                        - Prior to the procedure, a History and Physical was                         performed, and patient medications, allergies and                         sensitivities were reviewed. The patient's tolerance                         of previous anesthesia was reviewed.                        - The risks and benefits of the procedure and the                         sedation options and risks were discussed with the                         patient. All questions were answered and informed                         consent was obtained.                        - ASA Grade Assessment: II - A patient with mild                         systemic disease.                        After obtaining informed consent, the colonoscope was                         passed under direct vision. Throughout the procedure,                         the patient's blood pressure, pulse, and oxygen                         saturations were monitored continuously. The                         Colonoscope was introduced through the  anus with the                         intention of advancing to the cecum. The scope was                         advanced to the sigmoid colon before the procedure was                         aborted. Medications were given. The colonoscopy was        performed with ease. The patient tolerated the                         procedure well. The quality of the bowel preparation                         was unsatisfactory. The colonoscopy was aborted due to                         inadequate bowel prep. Findings:      The perianal and digital rectal examinations were normal. Impression:            - Preparation of the colon was unsatisfactory.                        - The procedure was aborted due to inadequate bowel                         prep.                        - No specimens collected. Recommendation:        - Discharge patient to home (with escort).                        - Resume previous diet.                        - Continue present medications.                        - Repeat colonoscopy tomorrow because the bowel                         preparation was suboptimal. Procedure Code(s):     --- Professional ---                        [phone], 53, Colonoscopy, flexible; diagnostic,                         including collection of specimen(s) by brushing or                         washing, when performed (separate procedure) Diagnosis Code(s):     --- Professional ---                        Z12.11, Encounter for screening for malignant neoplasm  of colon CPT copyright 2019 American Medical Association. All rights reserved. The codes documented in this report are preliminary and upon coder review may  be revised to meet current compliance requirements. Jonathon Bellows, MD Jonathon Bellows MD, MD 01/27/2020 10:47:08 AM This report has been signed electronically. Number of Addenda: 0 Note Initiated On: 01/27/2020 10:37 AM Total Procedure Duration: 0 hours 2 minutes 4 seconds  Estimated Blood Loss:  Estimated blood loss: none.      [facility]

## 2020-01-27 NOTE — Anesthesia Preprocedure Evaluation (Signed)
Anesthesia Evaluation  Patient identified by MRN, date of birth, ID band Patient awake    Reviewed: Allergy & Precautions, H&P , NPO status , Patient's Chart, lab work & pertinent test results, reviewed documented beta blocker date and time   Airway Mallampati: II   Neck ROM: full    Dental  (+) Poor Dentition, Teeth Intact   Pulmonary neg pulmonary ROS, former smoker,    Pulmonary exam normal        Cardiovascular Exercise Tolerance: Poor hypertension, On Medications negative cardio ROS Normal cardiovascular exam Rhythm:regular Rate:Normal     Neuro/Psych  Headaches, PSYCHIATRIC DISORDERS Depression  Neuromuscular disease CVA    GI/Hepatic negative GI ROS, Neg liver ROS,   Endo/Other  diabetesMorbid obesity  Renal/GU negative Renal ROS  negative genitourinary   Musculoskeletal   Abdominal   Peds  Hematology negative hematology ROS (+)   Anesthesia Other Findings Past Medical History: No date: Arthritis No date: Depression No date: Diabetes mellitus without complication ([facility]) No date: Frequent headaches No date: Gout No date: Hyperlipidemia No date: Hypertension No date: Stroke [facility]) Past Surgical History: 2007: BACK SURGERY     Comment:  [facility] No date: TONSILLECTOMY BMI    Body Mass Index: 42.99 kg/m     Reproductive/Obstetrics negative OB ROS                             Anesthesia Physical Anesthesia Plan  ASA: III  Anesthesia Plan: General   Post-op Pain Management:    Induction:   PONV Risk Score and Plan:   Airway Management Planned:   Additional Equipment:   Intra-op Plan:   Post-operative Plan:   Informed Consent: I have reviewed the patients History and Physical, chart, labs and discussed the procedure including the risks, benefits and alternatives for the proposed anesthesia with the patient or authorized representative who has indicated his/her  understanding and acceptance.     Dental Advisory Given  Plan Discussed with: CRNA  Anesthesia Plan Comments:         Anesthesia Quick Evaluation

## 2020-01-27 NOTE — Progress Notes (Signed)
Written instructions were provided to patient instructing him to pick up prep from Dr. Georgeann Finley office (directions and telephone humber provided). He was also instructed to consume clear liquids only and he will need to report to the hospital tomorrow at 1pm. Jamie Finley verbalized understanding.

## 2020-01-27 NOTE — Transfer of Care (Signed)
Immediate Anesthesia Transfer of Care Note  Patient: Jamie Finley  Procedure(s) Performed: COLONOSCOPY WITH PROPOFOL (N/A )  Patient Location: PACU  Anesthesia Type:General  Level of Consciousness: awake, alert  and oriented  Airway & Oxygen Therapy: Patient Spontanous Breathing and Patient connected to face mask oxygen  Post-op Assessment: Report given to RN and Post -op Vital signs reviewed and stable  Post vital signs: Reviewed and stable  Last Vitals:  Vitals Value Taken Time  BP    Temp    Pulse    Resp    SpO2      Last Pain:  Vitals:   01/27/20 0929  TempSrc: Temporal  PainSc: 0-No pain         Complications: No apparent anesthesia complications

## 2020-01-28 ENCOUNTER — Ambulatory Visit: Payer: Medicaid Other | Admitting: Anesthesiology

## 2020-01-28 ENCOUNTER — Other Ambulatory Visit: Payer: Self-pay

## 2020-01-28 ENCOUNTER — Encounter
Admission: RE | Disposition: A | Discharge status: Home / Self Care | Payer: Self-pay | Source: Home / Self Care | Attending: Gastroenterology

## 2020-01-28 ENCOUNTER — Encounter: Payer: Self-pay | Admitting: *Deleted

## 2020-01-28 ENCOUNTER — Ambulatory Visit
Admission: RE | Admit: 2020-01-28 | Discharge: 2020-01-28 | Disposition: A | Discharge status: Home / Self Care | Payer: Medicaid Other | Attending: Gastroenterology | Admitting: Gastroenterology

## 2020-01-28 DIAGNOSIS — D122 Benign neoplasm of ascending colon: Secondary | ICD-10-CM | POA: Insufficient documentation

## 2020-01-28 DIAGNOSIS — Z7984 Long term (current) use of oral hypoglycemic drugs: Secondary | ICD-10-CM | POA: Insufficient documentation

## 2020-01-28 DIAGNOSIS — D123 Benign neoplasm of transverse colon: Secondary | ICD-10-CM | POA: Insufficient documentation

## 2020-01-28 DIAGNOSIS — M109 Gout, unspecified: Secondary | ICD-10-CM | POA: Insufficient documentation

## 2020-01-28 DIAGNOSIS — E785 Hyperlipidemia, unspecified: Secondary | ICD-10-CM | POA: Insufficient documentation

## 2020-01-28 DIAGNOSIS — Z87891 Personal history of nicotine dependence: Secondary | ICD-10-CM | POA: Insufficient documentation

## 2020-01-28 DIAGNOSIS — Z8673 Personal history of transient ischemic attack (TIA), and cerebral infarction without residual deficits: Secondary | ICD-10-CM | POA: Diagnosis not present

## 2020-01-28 DIAGNOSIS — M1990 Unspecified osteoarthritis, unspecified site: Secondary | ICD-10-CM | POA: Insufficient documentation

## 2020-01-28 DIAGNOSIS — D126 Benign neoplasm of colon, unspecified: Secondary | ICD-10-CM | POA: Diagnosis not present

## 2020-01-28 DIAGNOSIS — Z7982 Long term (current) use of aspirin: Secondary | ICD-10-CM | POA: Insufficient documentation

## 2020-01-28 DIAGNOSIS — Z1211 Encounter for screening for malignant neoplasm of colon: Secondary | ICD-10-CM | POA: Insufficient documentation

## 2020-01-28 DIAGNOSIS — I10 Essential (primary) hypertension: Secondary | ICD-10-CM | POA: Insufficient documentation

## 2020-01-28 DIAGNOSIS — E119 Type 2 diabetes mellitus without complications: Secondary | ICD-10-CM | POA: Diagnosis not present

## 2020-01-28 DIAGNOSIS — Z79899 Other long term (current) drug therapy: Secondary | ICD-10-CM | POA: Diagnosis not present

## 2020-01-28 HISTORY — PX: COLONOSCOPY WITH PROPOFOL: SHX5780

## 2020-01-28 LAB — GLUCOSE, CAPILLARY: Glucose-Capillary: 130 mg/dL — ABNORMAL HIGH (ref 70–99)

## 2020-01-28 SURGERY — COLONOSCOPY WITH PROPOFOL
Anesthesia: General

## 2020-01-28 MED ORDER — PROPOFOL 10 MG/ML IV BOLUS
INTRAVENOUS | Status: AC
  Filled 2020-01-28: qty 20

## 2020-01-28 MED ORDER — PROPOFOL 10 MG/ML IV BOLUS
INTRAVENOUS | Status: DC | PRN
  Administered 2020-01-28: 100 mg via INTRAVENOUS

## 2020-01-28 MED ORDER — PROPOFOL 500 MG/50ML IV EMUL
INTRAVENOUS | Status: DC | PRN
  Administered 2020-01-28: 200 ug/kg/min via INTRAVENOUS

## 2020-01-28 MED ORDER — SODIUM CHLORIDE 0.9 % IV SOLN
INTRAVENOUS | Status: DC
  Administered 2020-01-28: 1000 mL via INTRAVENOUS

## 2020-01-28 NOTE — Anesthesia Procedure Notes (Signed)
Date/Time: 01/28/2020 2:11 PM Performed by: Nelda Marseille, CRNA Pre-anesthesia Checklist: Patient identified, Emergency Drugs available, Suction available, Patient being monitored and Timeout performed Oxygen Delivery Method: Supernova nasal CPAP

## 2020-01-28 NOTE — H&P (Signed)
   Kiran Anna, MD [address], [address], [place], [place], [address] [address], [address], [place], [place], [address] Phone: [phone]  Fax: [phone]  Primary Care Physician:  Bedsole, Amy E, MD   Pre-Procedure History & Physical: HPI:  Jamie Finley is a 59 y.o. male is here for an colonoscopy.   Past Medical History:  Diagnosis Date  . Arthritis   . Depression   . Diabetes mellitus without complication (HCC)   . Frequent headaches   . Gout   . Hyperlipidemia   . Hypertension   . Stroke (HCC)     Past Surgical History:  Procedure Laterality Date  . BACK SURGERY  2007   [facility]  . COLONOSCOPY WITH PROPOFOL N/A 01/27/2020   Procedure: COLONOSCOPY WITH PROPOFOL;  Surgeon: Anna, Kiran, MD;  Location: [facility] ENDOSCOPY;  Service: Gastroenterology;  Laterality: N/A;  . TONSILLECTOMY      Prior to Admission medications   Medication Sig Start Date End Date Taking? Authorizing Provider  acetaminophen (TYLENOL) 325 MG tablet Take 325 mg by mouth every 4 (four) hours as needed.    Yes [provider]  aspirin 81 MG tablet Take 81 mg by mouth daily.   Yes Shah, Syed Asad A, MD  blood glucose meter kit and supplies Dispense based on patient and insurance preference. Use to check blood sugar two times a day. 12/12/18  Yes Bedsole, Amy E, MD  Cholecalciferol (D3-1000 PO) Take 5 tablets by mouth daily.   Yes [provider]  colchicine 0.6 MG tablet Take 1 tablet (0.6 mg total) by mouth daily as needed. 09/10/19  Yes Bedsole, Amy E, MD  gabapentin (NEURONTIN) 400 MG capsule Take 400 mg by mouth daily as needed.   Yes [provider]  hydrochlorothiazide (HYDRODIURIL) 25 MG tablet Take 1 tablet (25 mg total) by mouth daily. 09/02/19  Yes Bedsole, Amy E, MD  lisinopril (ZESTRIL) 10 MG tablet Take 0.5 tablets (5 mg total) by mouth daily. 09/02/19  Yes Bedsole, Amy E, MD  metFORMIN (GLUCOPHAGE-XR) 500 MG 24 hr tablet Take 2 tablets (1,000 mg total) by  mouth in the morning and at bedtime. 12/18/19  Yes Bedsole, Amy E, MD  methocarbamol (ROBAXIN) 750 MG tablet Take 1 tablet (750 mg total) by mouth daily as needed for muscle spasms. 09/10/19  Yes Bedsole, Amy E, MD  Multiple Vitamins-Minerals (ZINC PO) Take 1 tablet by mouth daily.   Yes [provider]  polyethylene glycol (MIRALAX / GLYCOLAX) packet Take 17 g by mouth daily. 06/22/18  Yes Mody, Sital, MD  rosuvastatin (CRESTOR) 20 MG tablet Take 1 tablet (20 mg total) by mouth at bedtime. 09/02/19  Yes Bedsole, Amy E, MD    Allergies as of 01/27/2020  . (No Known Allergies)    Family History  Problem Relation Age of Onset  . Cancer Mother        face and jaw  . Cancer Father        kidney  . Early death Father   . Diabetes Maternal Grandmother   . Depression Maternal Grandfather   . Diabetes Maternal Grandfather   . Hearing loss Maternal Grandfather     Social History   Socioeconomic History  . Marital status: Single    Spouse name: Not on file  . Number of children: Not on file  . Years of education: Not on file  . Highest education level: Not on file  Occupational History  . Not on file    Tobacco Use  . Smoking status: Former Smoker    Quit date: 09/11/1989    Years since quitting: 30.4  . Smokeless tobacco: Never Used  Substance and Sexual Activity  . Alcohol use: Yes    Comment: rarely   . Drug use: No  . Sexual activity: Not Currently  Other Topics Concern  . Not on file  Social History Narrative  . Not on file   Social Determinants of Health   Financial Resource Strain:   . Difficulty of Paying Living Expenses:   Food Insecurity:   . Worried About Running Out of Food in the Last Year:   . Ran Out of Food in the Last Year:   Transportation Needs:   . Lack of Transportation (Medical):   . Lack of Transportation (Non-Medical):   Physical Activity:   . Days of Exercise per Week:   . Minutes of Exercise per Session:   Stress:   . Feeling of Stress  :   Social Connections:   . Frequency of Communication with Friends and Family:   . Frequency of Social Gatherings with Friends and Family:   . Attends Religious Services:   . Active Member of Clubs or Organizations:   . Attends Club or Organization Meetings:   . Marital Status:   Intimate Partner Violence:   . Fear of Current or Ex-Partner:   . Emotionally Abused:   . Physically Abused:   . Sexually Abused:     Review of Systems: See HPI, otherwise negative ROS  Physical Exam: BP 103/62   Pulse 66   Temp 97.9 F (36.6 C) (Temporal)   Resp 20   Ht 6' (1.829 m)   Wt (!) 143.8 kg   SpO2 97%   BMI 42.99 kg/m  General:   Alert,  pleasant and cooperative in NAD Head:  Normocephalic and atraumatic. Neck:  Supple; no masses or thyromegaly. Lungs:  Clear throughout to auscultation, normal respiratory effort.    Heart:  +S1, +S2, Regular rate and rhythm, No edema. Abdomen:  Soft, nontender and nondistended. Normal bowel sounds, without guarding, and without rebound.   Neurologic:  Alert and  oriented x4;  grossly normal neurologically.  Impression/Plan: Jamie Finley is here for an colonoscopy to be performed for Screening colonoscopy average risk   Risks, benefits, limitations, and alternatives regarding  colonoscopy have been reviewed with the patient.  Questions have been answered.  All parties agreeable.   Kiran Anna, MD  01/28/2020, 1:48 PM 

## 2020-01-28 NOTE — Anesthesia Postprocedure Evaluation (Signed)
Anesthesia Post Note  Patient: Jamie Finley  Procedure(s) Performed: COLONOSCOPY WITH PROPOFOL (N/A )  Patient location during evaluation: Endoscopy Anesthesia Type: General Level of consciousness: awake and alert and oriented Pain management: pain level controlled Vital Signs Assessment: post-procedure vital signs reviewed and stable Respiratory status: spontaneous breathing, nonlabored ventilation and respiratory function stable Cardiovascular status: blood pressure returned to baseline and stable Postop Assessment: no signs of nausea or vomiting Anesthetic complications: no     Last Vitals:  Vitals:   01/28/20 1444 01/28/20 1454  BP: 99/68 110/81  Pulse: 78 68  Resp: 20 (!) 22  Temp:    SpO2: 97% 97%    Last Pain:  Vitals:   01/28/20 1454  TempSrc:   PainSc: 0-No pain                 Savalas Monje

## 2020-01-28 NOTE — Op Note (Signed)
[facility] Patient Name: Jamie Finley Procedure Date: 01/28/2020 1:48 PM MRN: [id number] Account #: [id number] Date of Birth: 1961/06/24 Admit Type: Outpatient Age: 59 Room: [facility] ENDO ROOM 4 Gender: Male Note Status: Finalized Procedure:             Colonoscopy Indications:           Screening for colorectal malignant neoplasm Providers:             Jonathon Bellows MD, MD Medicines:             Monitored Anesthesia Care Complications:         No immediate complications. Procedure:             Pre-Anesthesia Assessment:                        - Prior to the procedure, a History and Physical was                         performed, and patient medications, allergies and                         sensitivities were reviewed. The patient's tolerance                         of previous anesthesia was reviewed.                        - The risks and benefits of the procedure and the                         sedation options and risks were discussed with the                         patient. All questions were answered and informed                         consent was obtained.                        - ASA Grade Assessment: II - A patient with mild                         systemic disease.                        After obtaining informed consent, the colonoscope was                         passed under direct vision. Throughout the procedure,                         the patient's blood pressure, pulse, and oxygen                         saturations were monitored continuously. The                         Colonoscope was introduced through the anus and  advanced to the the cecum, identified by the                         appendiceal orifice. The colonoscopy was performed                         with ease. The patient tolerated the procedure well.                         The quality of the bowel preparation was excellent. Findings:  The perianal and digital rectal examinations were normal.      Two sessile polyps were found in the transverse colon and ascending       colon. The polyps were 4 to 5 mm in size. These polyps were removed with       a cold snare. Resection and retrieval were complete.      Two sessile polyps were found in the ascending colon. The polyps were 6       to 8 mm in size. These polyps were removed with a cold snare. Resection       and retrieval were complete. To prevent bleeding after the polypectomy,       two hemostatic clips were successfully placed. There was no bleeding at       the end of the procedure.      The exam was otherwise without abnormality on direct and retroflexion       views. Impression:            - Two 4 to 5 mm polyps in the transverse colon and in                         the ascending colon, removed with a cold snare.                         Resected and retrieved.                        - Two 6 to 8 mm polyps in the ascending colon, removed                         with a cold snare. Resected and retrieved. Clips were                         placed.                        - The examination was otherwise normal on direct and                         retroflexion views. Recommendation:        - Discharge patient to home (with escort).                        - Resume previous diet.                        - Continue present medications.                        - Await pathology results.                        -  Repeat colonoscopy for surveillance based on                         pathology results. Procedure Code(s):     --- Professional ---                        [phone], Colonoscopy, flexible; with removal of                         tumor(s), polyp(s), or other lesion(s) by snare                         technique Diagnosis Code(s):     --- Professional ---                        Z12.11, Encounter for screening for malignant neoplasm                         of colon                         K63.5, Polyp of colon CPT copyright 2019 [employer]. All rights reserved. The codes documented in this report are preliminary and upon coder review may  be revised to meet current compliance requirements. Jonathon Bellows, MD Jonathon Bellows MD, MD 01/28/2020 2:29:56 PM This report has been signed electronically. Number of Addenda: 0 Note Initiated On: 01/28/2020 1:48 PM Scope Withdrawal Time: 0 hours 17 minutes 44 seconds  Total Procedure Duration: 0 hours 20 minutes 2 seconds  Estimated Blood Loss:  Estimated blood loss: none.      [facility]

## 2020-01-28 NOTE — Anesthesia Preprocedure Evaluation (Addendum)
Anesthesia Evaluation  Patient identified by MRN, date of birth, ID band Patient awake    Reviewed: Allergy & Precautions, H&P , NPO status , Patient's Chart, lab work & pertinent test results  History of Anesthesia Complications Negative for: history of anesthetic complications  Airway Mallampati: III  TM Distance: >3 FB Neck ROM: full    Dental  (+) Teeth Intact   Pulmonary neg shortness of breath, neg sleep apnea, neg COPD, neg recent URI, former smoker,    breath sounds clear to auscultation       Cardiovascular hypertension, (-) angina(-) Past MI (-) dysrhythmias  Rhythm:regular Rate:Normal     Neuro/Psych  Headaches, PSYCHIATRIC DISORDERS Depression Wheelchair bound after back surgery CVA    GI/Hepatic negative GI ROS, Neg liver ROS,   Endo/Other  diabetesMorbid obesity  Renal/GU negative Renal ROS     Musculoskeletal   Abdominal   Peds  Hematology negative hematology ROS (+)   Anesthesia Other Findings   Reproductive/Obstetrics negative OB ROS                            Anesthesia Physical Anesthesia Plan  ASA: III  Anesthesia Plan: General   Post-op Pain Management:    Induction:   PONV Risk Score and Plan: Propofol infusion and TIVA  Airway Management Planned: Nasal Cannula and Natural Airway  Additional Equipment:   Intra-op Plan:   Post-operative Plan:   Informed Consent: I have reviewed the patients History and Physical, chart, labs and discussed the procedure including the risks, benefits and alternatives for the proposed anesthesia with the patient or authorized representative who has indicated his/her understanding and acceptance.       Plan Discussed with: Anesthesiologist  Anesthesia Plan Comments:         Anesthesia Quick Evaluation

## 2020-01-28 NOTE — Transfer of Care (Signed)
Immediate Anesthesia Transfer of Care Note  Patient: Jamie Finley  Procedure(s) Performed: COLONOSCOPY WITH PROPOFOL (N/A )  Patient Location: PACU  Anesthesia Type:General  Level of Consciousness: sedated  Airway & Oxygen Therapy: Patient Spontanous Breathing and non-rebreather face mask  Post-op Assessment: Report given to RN and Post -op Vital signs reviewed and stable  Post vital signs: Reviewed and stable  Last Vitals:  Vitals Value Taken Time  BP    Temp    Pulse    Resp    SpO2      Last Pain:  Vitals:   01/28/20 1240  TempSrc: Temporal  PainSc: 0-No pain      Patients Stated Pain Goal: 0 ([id number])  Complications: No apparent anesthesia complications

## 2020-01-29 ENCOUNTER — Encounter: Payer: Self-pay | Admitting: *Deleted

## 2020-01-29 NOTE — Anesthesia Postprocedure Evaluation (Signed)
Anesthesia Post Note  Patient: Jamie Finley  Procedure(s) Performed: COLONOSCOPY WITH PROPOFOL (N/A )  Patient location during evaluation: PACU Anesthesia Type: General Level of consciousness: awake and alert Pain management: pain level controlled Vital Signs Assessment: post-procedure vital signs reviewed and stable Respiratory status: spontaneous breathing, nonlabored ventilation, respiratory function stable and patient connected to nasal cannula oxygen Cardiovascular status: blood pressure returned to baseline and stable Postop Assessment: no apparent nausea or vomiting Anesthetic complications: no     Last Vitals:  Vitals:   01/27/20 1050 01/27/20 1110  BP:  136/77  Pulse:    Resp:    Temp: (!) 36.1 C   SpO2:      Last Pain:  Vitals:   01/28/20 0747  TempSrc:   PainSc: 0-No pain                 Molli Barrows

## 2020-01-30 LAB — SURGICAL PATHOLOGY

## 2020-02-02 ENCOUNTER — Encounter: Payer: Self-pay | Admitting: Gastroenterology

## 2020-02-18 LAB — HM DIABETES EYE EXAM

## 2020-02-20 ENCOUNTER — Encounter: Payer: Self-pay | Admitting: Family Medicine

## 2020-02-24 ENCOUNTER — Other Ambulatory Visit: Payer: Self-pay

## 2020-02-24 ENCOUNTER — Ambulatory Visit: Payer: Medicaid Other | Attending: Family Medicine | Admitting: Physical Therapy

## 2020-02-24 DIAGNOSIS — G834 Cauda equina syndrome: Secondary | ICD-10-CM | POA: Insufficient documentation

## 2020-02-24 DIAGNOSIS — R262 Difficulty in walking, not elsewhere classified: Secondary | ICD-10-CM | POA: Diagnosis present

## 2020-02-24 NOTE — Therapy (Signed)
[place] MAIN [facility] SERVICES [address] [place], [place], [address] Phone: [phone]   Fax:  [phone]  Physical Therapy Evaluation  Patient Details  Name: Jamie Finley MRN: [id number] Date of Birth: 1961/05/16 No data recorded  Encounter Date: 02/24/2020   PT End of Session - 02/24/20 1522    Visit Number 1    Number of Visits 1    Date for PT Re-Evaluation 02/24/20    Authorization Type medicaid    PT Start Time 1432    PT Stop Time 1530    PT Time Calculation (min) 58 min    Equipment Utilized During Treatment Gait belt    Activity Tolerance Patient tolerated treatment well    Behavior During Therapy WFL for tasks assessed/performed           Past Medical History:  Diagnosis Date  . Arthritis   . Depression   . Diabetes mellitus without complication ([place])   . Frequent headaches   . Gout   . Hyperlipidemia   . Hypertension   . Stroke [facility])     Past Surgical History:  Procedure Laterality Date  . BACK SURGERY  2007   [facility]  . COLONOSCOPY WITH PROPOFOL N/A 01/27/2020   Procedure: COLONOSCOPY WITH PROPOFOL;  Surgeon: Jonathon Bellows, MD;  Location: [name] ENDOSCOPY;  Service: Gastroenterology;  Laterality: N/A;  . COLONOSCOPY WITH PROPOFOL N/A 01/28/2020   Procedure: COLONOSCOPY WITH PROPOFOL;  Surgeon: Jonathon Bellows, MD;  Location: [facility] ENDOSCOPY;  Service: Gastroenterology;  Laterality: N/A;  . TONSILLECTOMY      There were no vitals filed for this visit.           PATIENT INFORMATION: This Evaluation form will serve as the LMN for the following suppliers:  Supplier: [employer] [phone]   Reason for Referral:New Power Chair Patient/caregiver Goals:Needs a new power chair for mobility;  Patient was seen for face-to-face evaluation for new manual wheelchair.  Also present was   Felton Clinton                   to discuss recommendations and wheelchair options.  Further  paperwork was completed and sent to vendor.  Patient appears to qualify for manual mobility device at this time per objective findings.   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; CVA in 2017 (recovered); DM with some neuropathy; Does have intermittent swelling in LE which is relieved with HTN medication;  Diagnosis:Cauda Equina Syndrome Primary Diagnosis Onset: Oct 2019 '[]' Progressive Disease Relevant Past and Future Surgeries:Has had 2 spinal surgeries (2007/2008 had discectomy, in 2019 had surgery for Cauda Equina but unsure of type/extent of surgery) Height:5'11" Weight:315# Explain and recent changes or trends in weight: no fluctuations  Relevant History including falls: Pt reports numbness/tingling in BLE below knee with loss of sensation in BLE feet; He also has muscle spasms/tingling in BLE especially night;  Has had 2 falls in last year Reports using RW, SPC and manual wheelchair for mobility;       HOME ENVIRONMENT: '[x]' House (mobile home) '[]' Condo/town home  '[]' Apartment  '[]' Assisted Living    '[x]' Lives Alone '[]'  Lives with Others                                                    Hours with caregiver:   '[]'   Home is accessible to patient            Stairs  '[]' Yes '[]'  No     Ramp '[x]' Yes '[]' No Comments:     COMMUNITY ADL: TRANSPORTATION: '[]' Car    '[]' Van    '[]' Public Transportation    '[x]' Adapted w/c Lift   '[]' Ambulance   '[]' Other:       '[]' Sits in wheelchair during transport  Employment/School:   Leads singing at church/freelance;   Specific requirements pertaining to mobility ; mostly done from wheelchair                 Other:                                       FUNCTIONAL/SENSORY PROCESSING SKILLS:  Handedness:   '[x]' Right     '[]' Left    '[]' NA  Comments:                                 Functional Processing Skills for Wheeled Mobility '[x]' Processing Skills are adequate for safe wheelchair operation  Areas of concern than may interfere with safe operation of  wheelchair Description of problem   '[]'  Attention to environment     '[]' Judgment     '[]'  Hearing  '[]'  Vision or visual processing    '[]' Motor Planning  '[]'  Fluctuations in Behavior                                                   VERBAL COMMUNICATION: '[x]' WFL receptive '[x]'  WFL expressive '[]' Understandable  '[]' Difficult to understand  '[]' non-communicative '[]'  Uses an augmented communication device    CURRENT SEATING / MOBILITY: Current Mobility Base:   '[]' None  '[]' Dependent  '[x]' Manual  '[]' Scooter  '[]' Power   Type of Control:                       Manufacturer:                         Size:     20x18                    Age: 59 year (paid out of pocket)  Current Condition of Mobility Base:  Arm rests are falling off/not stable, back rest is tearing, unbalanced as wheelchair wants to veer to side                                                        Current Wheelchair components:  Removed leg rests so that he can propel with feet; no anti-tippers  Describe posture in present seating system: sits with mild slumped posture                                                                           SENSATION and SKIN ISSUES: Sensation '[]' Intact '[x]' Impaired '[]' Absent   Level of sensation:   Numbness lower leg below knee to feet;                         Pressure Relief: Able to perform effective pressure relief :   '[x]' Yes  '[]'  No Method:     transfers ; able to sit for 2-3 hours at a time;                                                                 If not, Why?:                                                                          Skin Issues/Skin Integrity Current Skin Issues   '[]' Yes '[x]' No  '[x]' Intact '[]'  Red area '[]'  Open Area  '[]' Scar Tissue '[]' At risk from prolonged sitting  Where                              History of Skin Issues   '[]' Yes '[x]' No  Where                                          When                                               Hx of skin flap surgeries '[]' Yes '[x]' No  Where                  When                                                  Limited sitting tolerance '[]' Yes '[x]' No Hours spent sitting in wheelchair daily:  2-3 hours at a time;  Complaint of Pain:  Please describe: None;                                                                                                             Swelling/Edema:  Reports mild swelling in LE which is controlled with diuretic                                                                                                                                                ADL STATUS (in reference to wheelchair use):  Indep Assist Unable Indep with Equip Not assessed Comments  Dressing     X                                                                    Eating     X                                                                                                                         Toileting      X  Bathing     X                                                      Uses shower chair                                                                         Grooming/ Hygiene     X                                                                                                                         Meal Prep     X                                                                                                                     IADLS    X                                                                                                              Bowel Management: '[x]' Continent  '[]' Incontinent  '[]' Accidents Comments:                                                  Bladder Management: '[]' Continent  '[x]' Incontinent  '[]' Accidents  Comments:        Neurogenic bladder  WHEELCHAIR SKILLS: Manual w/c Propulsion: '[x]' UE or LE strength and endurance sufficient to participate in ADLs using manual wheelchair Arm :  '[]' left '[]' right  '[x]' Both                                   Foot:   '[]' left '[]' right  '[x]' Both  Distance: 200 feet  Operate Scooter: '[x]'  Strength, hand grip, balance and transfer appropriate for use '[x]' Living environment is accessible for use of scooter  Operate Power w/c:  '[x]'  Std. Joystick   '[]'  Alternative Controls Indep '[x]'  Assist '[]'  Dependent/ Unable '[]'  N/A '[]'  '[x]' Safe          '[]'  Functional      Distance:                Bed confined without wheelchair '[]'  Yes '[x]'  No   STRENGTH/RANGE OF MOTION:  Range of Motion Strength  Shoulder                    WFL                                                 WFL                                        Elbow                    WFL                                                WFL                                            Wrist/Hand                     WFL                                                 Grip strength: R: 80#, L: 65#                                                                   Hip                                                             [facility]  Grossly 3+/5                                                     Knee               WFL                                                        Grossly 4/5                                                       Ankle Limited DF in RLE, LLE is WFL            R: 1/5, L: 3/5                                                          MOBILITY/BALANCE:  '[]'  Patient is totally dependent for mobility                                                                                               Balance Transfers Ambulation  Sitting Balance: Standing Balance: '[]'  Independent '[]'  Independent/Modified Independent  '[x]'  WFL     '[]'  WFL '[x]'  Supervision, with AD '[]'  Supervision  '[]'   Uses UE for balance  '[x]'  Supervision, with AD '[]'  Min Assist '[x]'  Ambulates with Assist  CGA for safety                         '[]'  Min Assist '[]'  Min assist '[]'  Mod Assist '[]'  Ambulates with Device:  '[x]'  RW   '[]'  StW   '[]'  Cane   '[]'                 '[]'  Mod Assist '[]'  Mod assist '[]'  Max assist   '[]'  Max Assist '[]'  Max assist '[]'  Dependent '[]'  Indep. Short Distance Only  '[]'  Unable '[]'  Unable '[]'  Lift / Sling Required Distance (in feet)           Ambulated 10 meter (0.32 m/s) indicating high fall risk                  '[]'  Sliding board '[]'  Unable to Ambulate: (Explain:  Cardio Status:  '[x]' Intact  '[]'  Impaired   '[]'  NA   , following gait, SPo2 96%, HR 100 bpm  Respiratory Status:  '[x]' Intact   '[]' Impaired   '[]' NA                                     Orthotics/Prosthetics:                                                                         Comments (Address manual vs power w/c vs scooter):    Pt does exhibit need for wheelchair for safety in his home with mobility. His current numbness/tingling in BLE and weakness in ankle making walking unsafe especially without assistance. Pt able to safely propel self in a manual wheelchair. However his current height/size does demonstrate need for custom chair. In addition he would benefit from a lightweight chair to improve his ability to get in/out of car for transportation so that he could safely get to this doctor appointments/grocery shop for independence with ADLs.                                                           Anterior / Posterior Obliquity Rotation-Pelvis  PELVIS    '[x]' Neutral  '[]'  Posterior  '[]'  Anterior     '[x]' WFL  '[]' Right Elevated  '[]' Left Elevated   '[x]' WFL  '[]' Right Anterior '[]'   Left Anterior    '[]'  Fixed '[]'  Partly Flexible '[x]'  Flexible  '[]'  Other  '[]'  Fixed  '[]'  Partly Flexible  '[x]'  Flexible '[]'  Other  '[]'  Fixed  '[]'  Partly Flexible  '[x]'  Flexible '[]'  Other  TRUNK '[x]' WFL '[]' Thoracic Kyphosis '[]' Lumbar  Lordosis   '[x]'  WFL '[]' Convex Right '[]' Convex Left   '[]' c-curve '[]' s-curve '[]' multiple  '[x]'  Neutral '[]'  Left-anterior '[]'  Right-anterior    '[]'  Fixed '[x]'  Flexible '[]'  Partly Flexible       Other  '[]'  Fixed '[x]'  Flexible '[]'  Partly Flexible '[]'  Other  '[]'  Fixed           '[x]'  Flexible '[]'  Partly Flexible '[]'  Other   Position Windswept   HIPS  '[x]'  Neutral '[]'  Abduct '[]'  ADduct '[x]'  Neutral '[]'  Right '[]'  Left       '[]'  Fixed  '[]'  Partly Flexible             '[]'  Dislocated '[x]'  Flexible '[]'  Subluxed    '[]'  Fixed '[]'  Partly Flexible  '[x]'  Flexible '[]'  Other              Foot Positioning Knee Positioning   Knees and  Feet  '[x]'  WFL '[]' Left '[]' Right '[x]'  WFL '[]' Left '[]' Right   KNEES ROM concerns: ROM concerns:   & Dorsi-Flexed                    '[]' Lt '[]' Rt                                  FEET Plantar Flexed                  '[]'   Lt '[]' Rt     Inversion                    '[]' Lt '[]' Rt     Eversion                    '[]' Lt '[]' Rt    HEAD '[x]'  Functional '[x]'  Good Head Control   & '[]'  Flexed         '[]'  Extended '[]'  Adequate Head Control   NECK '[]'  Rotated  Lt  '[]'  Lat Flexed Lt '[]'  Rotated  Rt '[]'  Lat Flexed Rt '[]'  Limited Head Control    '[]'  Cervical Hyperextension '[]'  Absent  Head Control    SHOULDERS ELBOWS WRIST& HAND         Left     Right    Left     Right  U/E '[x]' Functional  Left            '[x]' Functional  Right                                 '[]' Fisting             '[]' Fisting     '[]' elevated Left '[]' depressed  Left '[]' elevated Right '[]' depressed  Right      '[]' protracted Left '[]' retracted Left '[]' protracted Right '[]' retracted Right '[]' subluxed  Left              '[]' subluxed  Right         Goals for Wheelchair Mobility  '[x]'  Independence with mobility in the home with motor related ADLs (MRADLs)  '[]'  Independence with MRADLs in the community '[]'  Provide dependent mobility  '[]'  Provide recline     '[]' Provide tilt   Goals for Seating system '[x]'  Optimize pressure distribution '[]'  Provide support needed to  facilitate function or safety '[]'  Provide corrective forces to assist with maintaining or improving posture '[]'  Accommodate client's posture: current seated postures and positions are not flexible or will not tolerate corrective forces '[x]'  Client to be independent with relieving pressure in the wheelchair '[]' Enhance physiological function such as breathing, swallowing, digestion  Simulation ideas/Equipment trials:    Pt able to propel self in manual wheelchair using BUE and BLE to propel on level surface (laminate flooring) with no difficulty x200 feet with good SPO2/HR response;                                                                                             State why other equipment was unsuccessful:                                                                                 MOBILITY BASE RECOMMENDATIONS and JUSTIFICATION: MOBILITY COMPONENT JUSTIFICATION  Manufacturer:  [employer] mobility,  Catalyst 5 Model:              Size: Width 20 inch    Seat Depth   18 inch          '[x]' provide transport from point A to B '[x]' promote Indep mobility  '[x]' is not a safe, functional ambulator '[x]' walker or cane inadequate '[x]' non-standard width/depth necessary to accommodate anatomical measurement '[]'                             '[x]' Manual Mobility Base '[x]' non-functional ambulator    '[]' Scooter/POV  '[]' can safely operate  '[]' can safely transfer   '[]' has adequate trunk stability  '[]' cannot functionally propel manual w/c  '[]' Power Mobility Base  '[]' non-ambulatory  '[]' cannot functionally propel manual wheelchair  '[]'  cannot functionally and safely operate scooter/POV '[]' can safely operate and willing to  '[]' Stroller Base '[]' infant/child  '[]' unable to propel manual wheelchair '[]' allows for growth '[]' non-functional ambulator '[]' non-functional UE '[]' Indep mobility is not a goal at this time  '[]' Tilt  '[]' Forward                   '[]' Backward                  '[]' Powered tilt              '[]' Manual tilt  '[]' change position  against gravitational force on head and shoulders  '[]' change position for pressure relief/cannot weight shift '[]' transfers  '[]' management of tone '[]' rest periods '[]' control edema '[]' facilitate postural control  '[]'                                       '[]' Recline  '[]' Power recline on power base '[]' Manual recline on manual base  '[]' accommodate femur to back angle  '[]' bring to full recline for ADL care  '[]' change position for pressure relief/cannot weight shift '[]' rest periods '[]' repositioning for transfers or clothing/diaper /catheter changes '[]' head positioning  '[x]' Lighter weight required '[x]' self- propulsion  '[x]' lifting '[]'                                                 '[]' Heavy Duty required '[]' user weight greater than 250# '[]' extreme tone/ over active movement '[]' broken frame on previous chair '[]'                                     '[]'  Back  '[x]'  Tension Adjustable '[]'  Custom molded                           '[x]' postural control '[]' control of tone/spasticity '[]' accommodation of range of motion '[]' UE functional control '[]' accommodation for seating system '[]'                                          '[]' provide lateral trunk support '[]' accommodate deformity '[x]' provide posterior trunk support '[x]' provide lumbar/sacral support '[x]' support trunk in midline '[]' Pressure relief over spinal processes  '[x]'  Seat Cushion General use                       '[]'   impaired sensation  '[]' decubitus ulcers present '[]' history of pressure ulceration '[]' prevent pelvic extension '[x]' low maintenance  '[x]' stabilize pelvis  '[]' accommodate obliquity '[]' accommodate multiple deformity '[x]' neutralize lower extremity position '[]' increase pressure distribution '[]'                                           '[]'  Pelvic/thigh support  '[]'  Lateral thigh guide '[]'  Distal medial pad  '[]'  Distal lateral pad '[]'  pelvis in neutral '[]' accommodate pelvis '[]'  position upper legs '[]'  alignment '[]'  accommodate ROM '[]'  decrease adduction '[]' accommodate tone '[]' removable  for transfers '[]' decrease abduction  '[]'  Lateral trunk Supports '[]'  Lt     '[]'  Rt '[]' decrease lateral trunk leaning '[]' control tone '[]' contour for increased contact '[]' safety  '[]' accommodate asymmetry '[]'                                                 '[]'  Mounting hardware  '[]' lateral trunk supports  '[]' back   '[]' seat '[]' headrest      '[]'  thigh support '[]' fixed   '[]' swing away '[]' attach seat platform/cushion to w/c frame '[]' attach back cushion to w/c frame '[]' mount postural supports '[]' mount headrest  '[]' swing medial thigh support away '[]' swing lateral supports away for transfers  '[]'                                                     Armrests  '[]' fixed '[x]' adjustable height '[]' removable   '[]' swing away  '[x]' flip back   '[]' reclining '[x]' full length pads '[]' desk    '[]' pads tubular  '[]' provide support with elbow at 90   '[]' provide support for w/c tray '[x]' change of height/angles for variable activities '[]' remove for transfers '[x]' allow to come closer to table top '[x]' remove for access to tables '[]'                                               Hangers/ Leg rests  '[]' 60 '[]' 70 '[]' 90 '[]' elevating '[]' heavy duty  '[]' articulating '[]' fixed '[x]' lift off '[x]' swing away     '[]' power '[x]' provide LE support  '[]' accommodate to hamstring tightness '[]' elevate legs during recline   '[x]' provide change in position for Legs '[]' Maintain placement of feet on footplate '[]' durability '[]' enable transfers '[]' decrease edema '[]' Accommodate lower leg length '[]'                                         Foot support Footplate    '[]' Lt  '[]'  Rt  '[]'  Center mount '[]' flip up                            '[]' depth/angle adjustable '[]' Amputee adapter    '[]'  Lt     '[]'  Rt '[]' provide foot support '[]' accommodate to ankle ROM '[]' transfers '[]' Provide support for residual extremity '[]'  allow foot to go under wheelchair base '[]'  decrease tone  '[]'                                                 [  x] Ankle strap/heel loops '[x]' support foot on foot support '[]' decrease extraneous movement  '[]' provide input to heel  '[x]' protect foot  Tires: '[]' pneumatic  '[]' flat free inserts  '[x]' solid  '[x]' decrease maintenance  '[x]' prevent frequent flats '[x]' increase shock absorbency '[x]' decrease pain from road shock '[]' decrease spasms from road shock '[]'                                              '[]'  Headrest  '[]' provide posterior head support '[]' provide posterior neck support '[]' provide lateral head support '[]' provide anterior head support '[]' support during tilt and recline '[]' improve feeding   '[]' improve respiration '[]' placement of switches '[]' safety  '[]' accommodate ROM  '[]' accommodate tone '[]' improve visual orientation  '[]'  Anterior chest strap '[]'  Vest '[]'  Shoulder retractors  '[]' decrease forward movement of shoulder '[]' accommodation of TLSO '[]' decrease forward movement of trunk '[]' decrease shoulder elevation '[]' added abdominal support '[]' alignment '[]' assistance with shoulder control  '[]'                                               Pelvic Positioner '[]' Belt '[]' SubASIS bar '[]' Dual Pull '[]' stabilize tone '[]' decrease falling out of chair/ **will not Decrease potential for sliding due to pelvic tilting '[]' prevent excessive rotation '[]' pad for protection over boney prominence '[]' prominence comfort '[]' special pull angle to control rotation '[]'                                                  Upper ExtremitySupport  '[]' L   '[]'  R '[]' Arm trough   '[]' hand support '[]'  tray       '[]' full tray '[]' swivel mount '[]' decrease edema      '[]' decrease subluxation   '[]' control tone   '[]' placement for AAC/Computer/EADL '[]' decrease gravitational pull on shoulders '[]' provide midline positioning '[]' provide support to increase UE function '[]' provide hand support in natural position '[]' provide work surface   POWER WHEELCHAIR CONTROLS  '[]' Proportional  '[]' Non-Proportional Type                                      '[]' Left  '[]' Right '[]' provides access for controlling wheelchair   '[]' lacks motor control to operate proportional drive control '[]' unable  to understand proportional controls  Actuator Control Module  '[]' Single  '[]' Multiple   '[]' Allow the client to operate the power seat function(s) through the joystick control   '[]' Safety Reset Switches '[]' Used to change modes and stop the wheelchair when driving in latch mode    '[]' Therapist, art   '[]' programming for accurate control '[]' progressive Disease/changing condition '[]' non-proportional drive control needed '[]' Needed in order to operate power seat functions through joystick control   '[]' Display box '[]' Allows user to see in which mode and drive the wheelchair is set  '[]' necessary for alternate controls    '[]' Digital interface electronics '[]' Allows w/c to operate when using alternative drive controls  '[]' ASL Head Array '[]' Allows client to operate wheelchair  through switches placed in tri-panel headrest  '[]' Sip and puff with tubing kit '[]' needed to operate sip and puff drive controls  '[]' Upgraded tracking electronics '[]' increase safety when driving '[]' correct tracking when on uneven surfaces  '[]'   Mount for switches or joystick '[]' Attaches switches to w/c  '[]' Swing away for access or transfers '[]' midline for optimal placement '[]' provides for consistent access  '[]' Attendant controlled joystick plus mount '[]' safety '[]' long distance driving '[]' operation of seat functions '[]' compliance with transportation regulations '[]'                                             Rear wheel placement/Axle adjustability '[]' None '[]' semi adjustable '[x]' fully adjustable  '[x]' improved UE access to wheels '[]' improved stability '[]' changing angle in space for improvement of postural stability '[]' 1-arm drive access '[]' amputee pad placement '[]'                                Wheel rims/ hand rims  '[x]' metal   '[]' plastic coated '[]' oblique projections           '[]' vertical projections '[x]' Provide ability to propel manual wheelchair  '[x]'  Increase self-propulsion with hand weakness/decreased grasp  Push handles '[]' extended   '[]' angle adjustable               '[x]' standard '[x]' caregiver access '[x]' caregiver assist '[]' allows "hooking" to enable increased ability to perform ADLs or maintain balance  One armed device   '[]' Lt   '[]' Rt '[]' enable propulsion of manual wheelchair with one arm   '[]'                                            Brake/wheel lock extension '[]'  Lt   '[]'  Rt '[]' increase indep in applying wheel locks   '[x]' Side guards '[x]' prevent clothing getting caught in wheel or becoming soiled '[]'  prevent skin tears/abrasions  Anti-tippers                                            '[x]' to prevent falls                                                         Other:                                                                                                                        The above equipment has a life- long use expectancy. Growth and changes in medical and/or functional conditions would be the exceptions. This is to certify that the therapist has no financial relationship with durable medical provider or manufacturer. The therapist will not receive remuneration of any kind for the equipment recommended in this evaluation.  Patient has mobility limitation that significantly impairs safe, timely participation in one or more mobility related ADL's. (bathing, toileting, feeding, dressing, grooming, moving from room to room)  '[x]'  Yes '[]'  No  Will mobility device sufficiently improve ability to participate and/or be aided in participation of MRADL's?      '[x]'  Yes '[]'  No  Can limitation be compensated for with use of a cane or walker?                                    '[]'  Yes '[x]'  No  Does patient or caregiver demonstrate ability/potential ability & willingness to safely use the mobility device?    '[x]'  Yes '[]'  No  Does patient's home environment support use of recommended mobility device?            '[x]'  Yes '[]'  No  Does patient have sufficient upper extremity function necessary to functionally propel a manual wheelchair?     '[x]'  Yes '[]'  No  Does patient  have sufficient strength and trunk stability to safely operate a POV (scooter)?                                  '[x]'  Yes '[]'  No  Does patient need additional features/benefits provided by a power wheelchair for MRADL's in the home?        '[]'  Yes '[x]'  No  Does the patient demonstrate the ability to safely use a power wheelchair?                   '[x]'  Yes '[]'  No     Physician's Name Printed:                                                        Physician's Signature:  Date:     This is to certify that I, the above signed therapist have the following affiliations: '[]'  This DME provider '[]'  Manufacturer of recommended equipment '[]'  Patient's long term care facility '[x]'  None of the above  Therapist Name/Signature:                                            Date:             Objective measurements completed on examination: See above findings.                    PT Long Term Goals - 02/24/20 1526      PT LONG TERM GOAL #1   Title Pt and caregivers will understand PT recommendation and appropriate/safe use for wheelchair and seating for home use.    Time 1    Period Days    Status Achieved    Target Date 02/24/20                  Plan - 02/24/20 1523    Clinical Impression Statement 59 yo Male presents to therapy for wheelchair evaluation. He has cauda equina syndrome and numbness/tingling in feet which make walking and mobility difficult. He does test as a  high fall risk and has had falls in last year. Patient would benefit from manual wheelchair for mobility in his home. See note;    Personal Factors and Comorbidities Comorbidity 3+;Time since onset of injury/illness/exacerbation    Comorbidities HTN, HLD, Cauda equina, DM, history of falls    Examination-Activity Limitations Carry;Locomotion Level;Squat;Stairs;Stand;Toileting;Transfers    Examination-Participation Restrictions Cleaning;Community Activity;Shop;Volunteer;Yard Work    Copy Stable/Uncomplicated    Designer, jewellery Low    Rehab Potential Fair    PT Frequency One time visit    PT Treatment/Interventions Wheelchair mobility training    Consulted and Agree with Plan of Care Patient           Patient will benefit from skilled therapeutic intervention in order to improve the following deficits and impairments:  Abnormal gait, Decreased balance, Decreased endurance, Decreased mobility, Difficulty walking, Impaired sensation, Decreased activity tolerance, Decreased strength  Visit Diagnosis: Cauda equina syndrome with neurogenic bladder (HCC)  Difficulty in walking, not elsewhere classified     Problem List Patient Active Problem List   Diagnosis Date Noted  . Cardiomegaly 02/25/2019  . History of DVT (deep vein thrombosis) 12/24/2018  . Cauda equina syndrome with neurogenic bladder ([place]) 12/10/2018  . Chronic constipation 12/10/2018  . Gout of right foot 09/25/2017  . Bilateral knee pain 08/24/2017  . MDD (recurrent major depressive disorder) in remission ([place]) 08/24/2017  . Chronic low back pain 02/27/2017  . History of CVA (cerebrovascular accident) 01/15/2016  . Hyperlipidemia associated with type 2 diabetes mellitus ([place]) 02/18/2015  . Peripheral autonomic neuropathy due to diabetes mellitus ([place]) 02/18/2015  . Diabetes mellitus type 2, uncontrolled, with complications ([place]) 87/27/6184  . Hypertension associated with diabetes ([place]) 12/15/2014  . Morbid obesity ([place]) 12/15/2014    Katerine Morua PT, DPT 02/24/2020, 3:30 PM  [place] MAIN [facility] SERVICES [address] [place], [place], [address] Phone: [phone]   Fax:  [phone]  Name: Jamie Finley MRN: [id number] Date of Birth: 1961-07-25

## 2020-02-27 ENCOUNTER — Telehealth: Payer: Self-pay | Admitting: Family Medicine

## 2020-02-27 DIAGNOSIS — E785 Hyperlipidemia, unspecified: Secondary | ICD-10-CM

## 2020-02-27 DIAGNOSIS — IMO0002 Reserved for concepts with insufficient information to code with codable children: Secondary | ICD-10-CM

## 2020-02-27 DIAGNOSIS — M10071 Idiopathic gout, right ankle and foot: Secondary | ICD-10-CM

## 2020-02-27 NOTE — Telephone Encounter (Signed)
-----   Message from Ellamae Sia sent at 02/18/2020  2:38 PM EDT ----- Regarding: Lab orders for Tuesday, 6.22.21 Lab orders for a 3 month follow up appt.

## 2020-03-02 ENCOUNTER — Other Ambulatory Visit (INDEPENDENT_AMBULATORY_CARE_PROVIDER_SITE_OTHER): Payer: Medicaid Other

## 2020-03-02 ENCOUNTER — Encounter: Payer: Self-pay | Admitting: Family Medicine

## 2020-03-02 ENCOUNTER — Other Ambulatory Visit: Payer: Self-pay

## 2020-03-02 ENCOUNTER — Ambulatory Visit (INDEPENDENT_AMBULATORY_CARE_PROVIDER_SITE_OTHER): Payer: Medicaid Other | Admitting: Family Medicine

## 2020-03-02 ENCOUNTER — Other Ambulatory Visit: Payer: Medicaid Other

## 2020-03-02 VITALS — BP 120/74 | HR 75 | Temp 97.7°F | Ht 72.0 in | Wt 314.2 lb

## 2020-03-02 DIAGNOSIS — K089 Disorder of teeth and supporting structures, unspecified: Secondary | ICD-10-CM

## 2020-03-02 DIAGNOSIS — K1120 Sialoadenitis, unspecified: Secondary | ICD-10-CM | POA: Diagnosis not present

## 2020-03-02 DIAGNOSIS — E1165 Type 2 diabetes mellitus with hyperglycemia: Secondary | ICD-10-CM

## 2020-03-02 DIAGNOSIS — E118 Type 2 diabetes mellitus with unspecified complications: Secondary | ICD-10-CM | POA: Diagnosis not present

## 2020-03-02 DIAGNOSIS — IMO0002 Reserved for concepts with insufficient information to code with codable children: Secondary | ICD-10-CM

## 2020-03-02 LAB — COMPREHENSIVE METABOLIC PANEL
ALT: 17 U/L (ref 0–53)
AST: 17 U/L (ref 0–37)
Albumin: 4.4 g/dL (ref 3.5–5.2)
Alkaline Phosphatase: 64 U/L (ref 39–117)
BUN: 15 mg/dL (ref 6–23)
CO2: 31 mEq/L (ref 19–32)
Calcium: 9.5 mg/dL (ref 8.4–10.5)
Chloride: 100 mEq/L (ref 96–112)
Creatinine, Ser: 1.04 mg/dL (ref 0.40–1.50)
GFR: 73.02 mL/min (ref >60.00)
Glucose, Bld: 205 mg/dL — ABNORMAL HIGH (ref 70–99)
Potassium: 3.9 mEq/L (ref 3.5–5.1)
Sodium: 137 mEq/L (ref 135–145)
Total Bilirubin: 0.7 mg/dL (ref 0.2–1.2)
Total Protein: 7.4 g/dL (ref 6.0–8.3)

## 2020-03-02 LAB — LIPID PANEL
Cholesterol: 138 mg/dL (ref 0–200)
HDL: 41.2 mg/dL (ref >39.00)
LDL Cholesterol: 62 mg/dL (ref 0–99)
NonHDL: 96.82
Total CHOL/HDL Ratio: 3
Triglycerides: 175 mg/dL — ABNORMAL HIGH (ref 0.0–149.0)
VLDL: 35 mg/dL (ref 0.0–40.0)

## 2020-03-02 LAB — HEMOGLOBIN A1C: Hgb A1c MFr Bld: 9.1 % — ABNORMAL HIGH (ref 4.6–6.5)

## 2020-03-02 MED ORDER — AMOXICILLIN-POT CLAVULANATE 875-125 MG PO TABS
1.0000 | ORAL_TABLET | Freq: Two times a day (BID) | ORAL | 0 refills | Status: AC
Start: 2020-03-02 — End: 2020-03-12

## 2020-03-02 NOTE — Progress Notes (Signed)
Jamie Town T. Zayquan Bogard, MD, [place] at [facility] [place], [address]  Phone: [phone]  FAX: [phone]  Jamie Finley - 59 y.o. male  MRN [id number]  Date of Birth: [birth year]  Date: 03/02/2020  PCP: Jamie Sanders, MD  Referral: Jamie Sanders, MD  Chief Complaint  Patient presents with  . Swelling in Right Jaw Area    This visit occurred during the SARS-CoV-2 public health emergency.  Safety protocols were in place, including screening questions prior to the visit, additional usage of staff PPE, and extensive cleaning of exam room while observing appropriate contact time as indicated for disinfecting solutions.   Subjective:   Jamie Finley is a 59 y.o. very pleasant male patient with Body mass index is 42.62 kg/m. who presents with the following:  Tenderness at the corner of the jaw and now swollen   Mom had some tenderness in the bone  He is a very nice 59 year old gentleman he presents with some swelling and pain that started this past weekend, approximately 4 or 5 days ago inferior to the corner of his jaw.  This is slowly been improving, but he wanted to come in and have me check this as well.  He has never had anything similar.  He is a poor dentition and knows that he has to have multiple teeth pulled and worked on by the dentist upcoming.  Review of Systems is noted in the HPI, as appropriate  Objective:   BP 120/74   Pulse 75   Temp 97.7 F (36.5 C) (Temporal)   Ht 6' (1.829 m)   Wt (!) 314 lb 4 oz (142.5 kg)   SpO2 97%   BMI 42.62 kg/m   GEN: No acute distress; alert,appropriate. PULM: Breathing comfortably in no respiratory distress PSYCH: Normally interactive.   ENT: He does have multiple teeth with extensive decay, but there is no tenderness inside of the mouth in and about the teeth.  He does have tenderness at the corner of the jaw inferior  to the bone.  I do not appreciate any swelling, but there is tender to palpation.  Laboratory and Imaging Data:  Assessment and Plan:     ICD-10-CM   1. Sialadenitis  K11.20   2. Poor dentition  K08.9    Reviewed classic symptoms with him.  He certainly could have an obstruction, this may resolve entirely on its own.  Recommended he do some saliva stimulating food in his mouth such as hard candy.  He does have poor dentition, and this may relate as well.  I am also going to give him some Augmentin in case this is an infective sial adenitis  He has follow-up scheduled already with his primary care doctor next week.  If symptoms persist greater than several weeks, then follow-up imaging such as CT would be an appropriate next step.  Follow-up: No follow-ups on file.  Meds ordered this encounter  Medications  . amoxicillin-clavulanate (AUGMENTIN) 875-125 MG tablet    Sig: Take 1 tablet by mouth 2 (two) times daily for 10 days.    Dispense:  20 tablet    Refill:  0   There are no discontinued medications. No orders of the defined types were placed in this encounter.   Signed,  Jamie Deed. Carly Sabo, MD   Outpatient Encounter Medications as of 03/02/2020  Medication Sig  . acetaminophen (TYLENOL) 325 MG tablet Take 325  mg by mouth every 4 (four) hours as needed.   Marland Kitchen aspirin 81 MG tablet Take 81 mg by mouth daily.  . blood glucose meter kit and supplies Dispense based on patient and insurance preference. Use to check blood sugar two times a day.  . Cholecalciferol (D3-1000 PO) Take 5 tablets by mouth daily.  . colchicine 0.6 MG tablet Take 1 tablet (0.6 mg total) by mouth daily as needed.  . gabapentin (NEURONTIN) 400 MG capsule Take 400 mg by mouth daily as needed.  . hydrochlorothiazide (HYDRODIURIL) 25 MG tablet Take 1 tablet (25 mg total) by mouth daily.  Marland Kitchen lisinopril (ZESTRIL) 10 MG tablet Take 0.5 tablets (5 mg total) by mouth daily.  . metFORMIN (GLUCOPHAGE-XR) 500 MG 24  hr tablet Take 2 tablets (1,000 mg total) by mouth in the morning and at bedtime.  . methocarbamol (ROBAXIN) 750 MG tablet Take 1 tablet (750 mg total) by mouth daily as needed for muscle spasms.  . Multiple Vitamins-Minerals (ZINC PO) Take 1 tablet by mouth daily.  . polyethylene glycol (MIRALAX / GLYCOLAX) packet Take 17 g by mouth daily.  . rosuvastatin (CRESTOR) 20 MG tablet Take 1 tablet (20 mg total) by mouth at bedtime.  Marland Kitchen amoxicillin-clavulanate (AUGMENTIN) 875-125 MG tablet Take 1 tablet by mouth 2 (two) times daily for 10 days.   No facility-administered encounter medications on file as of 03/02/2020.

## 2020-03-03 NOTE — Progress Notes (Signed)
No critical labs need to be addressed urgently. We will discuss labs in detail at upcoming office visit.   

## 2020-03-09 ENCOUNTER — Other Ambulatory Visit: Payer: Self-pay

## 2020-03-09 ENCOUNTER — Ambulatory Visit: Payer: Medicaid Other | Admitting: Family Medicine

## 2020-03-09 ENCOUNTER — Encounter: Payer: Self-pay | Admitting: Family Medicine

## 2020-03-09 VITALS — BP 100/60 | HR 92 | Temp 98.0°F | Ht 72.0 in | Wt 317.5 lb

## 2020-03-09 DIAGNOSIS — E118 Type 2 diabetes mellitus with unspecified complications: Secondary | ICD-10-CM | POA: Diagnosis not present

## 2020-03-09 DIAGNOSIS — E1165 Type 2 diabetes mellitus with hyperglycemia: Secondary | ICD-10-CM | POA: Diagnosis not present

## 2020-03-09 DIAGNOSIS — E1159 Type 2 diabetes mellitus with other circulatory complications: Secondary | ICD-10-CM | POA: Diagnosis not present

## 2020-03-09 DIAGNOSIS — I10 Essential (primary) hypertension: Secondary | ICD-10-CM

## 2020-03-09 DIAGNOSIS — E1169 Type 2 diabetes mellitus with other specified complication: Secondary | ICD-10-CM | POA: Diagnosis not present

## 2020-03-09 DIAGNOSIS — IMO0002 Reserved for concepts with insufficient information to code with codable children: Secondary | ICD-10-CM

## 2020-03-09 DIAGNOSIS — E785 Hyperlipidemia, unspecified: Secondary | ICD-10-CM

## 2020-03-09 NOTE — Assessment & Plan Note (Signed)
°  Cholesterol LDL at goal on  Statin. Tolerating well.

## 2020-03-09 NOTE — Assessment & Plan Note (Addendum)
Improving control on max metformin, but not at goal. Continue to work on diet changes, limited with activity given cauda equina history.  At next OV.. if not < 7.. can add back glipizide at max ( given cost issues with other meds)

## 2020-03-09 NOTE — Assessment & Plan Note (Signed)
Well controlled. Continue current medication.  

## 2020-03-09 NOTE — Patient Instructions (Addendum)
Work on increasing exercise and low carb diet.  Continue max metformin.

## 2020-03-09 NOTE — Progress Notes (Signed)
Chief Complaint  Patient presents with  . Diabetes    History of Present Illness: HPI    59 year old male presents for follow up DM.   Salivary gland pain and swelling improved after antibiotics.  Diabetes: Improving DM control, not yet a t goal . He has been hard on low carb.  On max  Metformin. Lab Results  Component Value Date   HGBA1C 9.1 (H) 03/02/2020  Using medications without difficulties: Hypoglycemic episodes: Hyperglycemic episodes: Feet problems: no uclers Blood Sugars averaging: 180-200 eye exam within last year: yes  Wt Readings from Last 3 Encounters:  03/09/20 (!) 317 lb 8 oz (144 kg)  03/02/20 (!) 314 lb 4 oz (142.5 kg)  01/28/20 (!) 317 lb (143.8 kg)     Cholesterol LDL at goal on  Statin. Tolerating well.  Hypertension:  At goal on current regimen. On 1/2  Tab of lisinopril. BP Readings from Last 3 Encounters:  03/09/20 100/60  03/02/20 120/74  01/28/20 110/81  Using medication without problems or lightheadedness:  none Chest pain with exertion:none Edema:stable Short of breath:none Average home BPs: Other issues:   This visit occurred during the SARS-CoV-2 public health emergency.  Safety protocols were in place, including screening questions prior to the visit, additional usage of staff PPE, and extensive cleaning of exam room while observing appropriate contact time as indicated for disinfecting solutions.   COVID 19 screen:  No recent travel or known exposure to COVID19 The patient denies respiratory symptoms of COVID 19 at this time. The importance of social distancing was discussed today.     Review of Systems  Constitutional: Negative for chills and fever.  HENT: Negative for congestion and ear pain.   Eyes: Negative for pain and redness.  Respiratory: Negative for cough and shortness of breath.   Cardiovascular: Negative for chest pain, palpitations and leg swelling.  Gastrointestinal: Negative for abdominal pain, blood in  stool, constipation, diarrhea, nausea and vomiting.  Genitourinary: Negative for dysuria.  Musculoskeletal: Negative for falls and myalgias.  Skin: Negative for rash.  Neurological: Negative for dizziness.  Psychiatric/Behavioral: Negative for depression. The patient is not nervous/anxious.       Past Medical History:  Diagnosis Date  . Arthritis   . Depression   . Diabetes mellitus without complication ([place])   . Frequent headaches   . Gout   . Hyperlipidemia   . Hypertension   . Stroke [facility])     reports that he quit smoking about 30 years ago. He has never used smokeless tobacco. He reports current alcohol use. He reports that he does not use drugs.   Current Outpatient Medications:  .  acetaminophen (TYLENOL) 325 MG tablet, Take 325 mg by mouth every 4 (four) hours as needed. , Disp: , Rfl:  .  amoxicillin-clavulanate (AUGMENTIN) 875-125 MG tablet, Take 1 tablet by mouth 2 (two) times daily for 10 days., Disp: 20 tablet, Rfl: 0 .  aspirin 81 MG tablet, Take 81 mg by mouth daily., Disp: , Rfl:  .  blood glucose meter kit and supplies, Dispense based on patient and insurance preference. Use to check blood sugar two times a day., Disp: 1 each, Rfl: 0 .  Cholecalciferol (D3-1000 PO), Take 5 tablets by mouth daily., Disp: , Rfl:  .  colchicine 0.6 MG tablet, Take 1 tablet (0.6 mg total) by mouth daily as needed., Disp: 30 tablet, Rfl: 1 .  gabapentin (NEURONTIN) 400 MG capsule, Take 400 mg by mouth daily as needed., Disp: ,  Rfl:  .  hydrochlorothiazide (HYDRODIURIL) 25 MG tablet, Take 1 tablet (25 mg total) by mouth daily., Disp: 90 tablet, Rfl: 1 .  lisinopril (ZESTRIL) 10 MG tablet, Take 0.5 tablets (5 mg total) by mouth daily., Disp: 45 tablet, Rfl: 1 .  metFORMIN (GLUCOPHAGE-XR) 500 MG 24 hr tablet, Take 2 tablets (1,000 mg total) by mouth in the morning and at bedtime., Disp: 360 tablet, Rfl: 1 .  methocarbamol (ROBAXIN) 750 MG tablet, Take 1 tablet (750 mg total) by mouth daily as  needed for muscle spasms., Disp: 30 tablet, Rfl: 1 .  Multiple Vitamins-Minerals (ZINC PO), Take 1 tablet by mouth daily., Disp: , Rfl:  .  polyethylene glycol (MIRALAX / GLYCOLAX) packet, Take 17 g by mouth daily., Disp: 14 each, Rfl: 0 .  rosuvastatin (CRESTOR) 20 MG tablet, Take 1 tablet (20 mg total) by mouth at bedtime., Disp: 90 tablet, Rfl: 2   Observations/Objective: Pulse 92, temperature 98 F (36.7 C), temperature source Temporal, height 6' (1.829 m), weight (!) 317 lb 8 oz (144 kg), SpO2 97 %.  Physical Exam Constitutional:      Appearance: He is well-developed. He is obese.  HENT:     Head: Normocephalic.     Right Ear: Hearing normal.     Left Ear: Hearing normal.     Nose: Nose normal.  Neck:     Thyroid: No thyroid mass or thyromegaly.     Vascular: No carotid bruit.     Trachea: Trachea normal.  Cardiovascular:     Rate and Rhythm: Normal rate and regular rhythm.     Pulses: Normal pulses.     Heart sounds: Heart sounds not distant. No murmur heard.  No friction rub. No gallop.      Comments: No peripheral edema Pulmonary:     Effort: Pulmonary effort is normal. No respiratory distress.     Breath sounds: Normal breath sounds.  Skin:    General: Skin is warm and dry.     Findings: No rash.  Psychiatric:        Speech: Speech normal.        Behavior: Behavior normal.        Thought Content: Thought content normal.      Assessment and Plan    Diabetes mellitus type 2, uncontrolled, with complications ([place]) Improving control on max metformin, but not at goal. Continue to work on diet changes, limited with activity given cauda equina history.  At next OV.. if not < 7.. can add back glipizide at max ( given cost issues with other meds)  Hyperlipidemia associated with type 2 diabetes mellitus (HCC)  Cholesterol LDL at goal on  Statin. Tolerating well.    Hypertension associated with diabetes ([place]) Well controlled. Continue current  medication.     [name], MD

## 2020-03-18 ENCOUNTER — Other Ambulatory Visit: Payer: Self-pay | Admitting: Family Medicine

## 2020-05-03 ENCOUNTER — Ambulatory Visit: Payer: Medicaid Other | Attending: Internal Medicine

## 2020-05-03 DIAGNOSIS — Z23 Encounter for immunization: Secondary | ICD-10-CM

## 2020-05-03 NOTE — Progress Notes (Signed)
   Covid-19 Vaccination Clinic  Name:  Jamie Finley    MRN: [id number] DOB: 11/14/1960  05/03/2020  Jamie Finley was observed post Covid-19 immunization for 15 minutes without incident. He was provided with Vaccine Information Sheet and instruction to access the V-Safe system.   Jamie Finley was instructed to call 911 with any severe reactions post vaccine: Marland Kitchen Difficulty breathing  . Swelling of face and throat  . A fast heartbeat  . A bad rash all over body  . Dizziness and weakness   Immunizations Administered    Name Date Dose VIS Date Route   Moderna COVID-19 Vaccine 05/03/2020  3:09 PM 0.5 mL 08/2019 Intramuscular   Manufacturer: Levan Hurst   Lot: [id number]   [place]: [phone]

## 2020-06-07 ENCOUNTER — Ambulatory Visit: Payer: Medicaid Other | Attending: Internal Medicine

## 2020-06-07 ENCOUNTER — Ambulatory Visit: Payer: Medicaid Other

## 2020-06-07 DIAGNOSIS — Z23 Encounter for immunization: Secondary | ICD-10-CM

## 2020-06-07 NOTE — Progress Notes (Signed)
   Covid-19 Vaccination Clinic  Name:  Jamie Finley    MRN: [id number] DOB: [birth year]  06/07/2020  Jamie Finley was observed post Covid-19 immunization for 15 minutes without incident. He was provided with Vaccine Information Sheet and instruction to access the V-Safe system.   Jamie Finley was instructed to call 911 with any severe reactions post vaccine: Marland Kitchen Difficulty breathing  . Swelling of face and throat  . A fast heartbeat  . A bad rash all over body  . Dizziness and weakness   Immunizations Administered    Name Date Dose VIS Date Route   Moderna COVID-19 Vaccine 06/07/2020  1:56 PM 0.5 mL 08/2019 Intramuscular   Manufacturer: Moderna   Lot: [id number]   [place]: [phone]

## 2020-06-10 ENCOUNTER — Encounter: Payer: Medicaid Other | Admitting: Family Medicine

## 2020-06-29 ENCOUNTER — Other Ambulatory Visit: Payer: Self-pay | Admitting: Family Medicine

## 2020-06-29 DIAGNOSIS — E785 Hyperlipidemia, unspecified: Secondary | ICD-10-CM

## 2020-07-23 ENCOUNTER — Other Ambulatory Visit: Payer: Self-pay

## 2020-07-23 ENCOUNTER — Encounter: Payer: Self-pay | Admitting: Family Medicine

## 2020-07-23 ENCOUNTER — Ambulatory Visit (INDEPENDENT_AMBULATORY_CARE_PROVIDER_SITE_OTHER): Payer: Medicaid Other | Admitting: Family Medicine

## 2020-07-23 VITALS — BP 100/64 | HR 77 | Ht 72.0 in

## 2020-07-23 DIAGNOSIS — E1143 Type 2 diabetes mellitus with diabetic autonomic (poly)neuropathy: Secondary | ICD-10-CM | POA: Diagnosis not present

## 2020-07-23 DIAGNOSIS — I152 Hypertension secondary to endocrine disorders: Secondary | ICD-10-CM

## 2020-07-23 DIAGNOSIS — E1165 Type 2 diabetes mellitus with hyperglycemia: Secondary | ICD-10-CM | POA: Diagnosis not present

## 2020-07-23 DIAGNOSIS — E785 Hyperlipidemia, unspecified: Secondary | ICD-10-CM

## 2020-07-23 DIAGNOSIS — IMO0002 Reserved for concepts with insufficient information to code with codable children: Secondary | ICD-10-CM

## 2020-07-23 DIAGNOSIS — E118 Type 2 diabetes mellitus with unspecified complications: Secondary | ICD-10-CM

## 2020-07-23 DIAGNOSIS — E1169 Type 2 diabetes mellitus with other specified complication: Secondary | ICD-10-CM

## 2020-07-23 DIAGNOSIS — N529 Male erectile dysfunction, unspecified: Secondary | ICD-10-CM

## 2020-07-23 DIAGNOSIS — L989 Disorder of the skin and subcutaneous tissue, unspecified: Secondary | ICD-10-CM | POA: Insufficient documentation

## 2020-07-23 DIAGNOSIS — Z6841 Body Mass Index (BMI) 40.0 and over, adult: Secondary | ICD-10-CM

## 2020-07-23 DIAGNOSIS — E1159 Type 2 diabetes mellitus with other circulatory complications: Secondary | ICD-10-CM | POA: Diagnosis not present

## 2020-07-23 LAB — POCT GLYCOSYLATED HEMOGLOBIN (HGB A1C): Hemoglobin A1C: 9.1 % — AB (ref 4.0–5.6)

## 2020-07-23 LAB — HM DIABETES FOOT EXAM

## 2020-07-23 MED ORDER — GLIPIZIDE ER 10 MG PO TB24
10.0000 mg | ORAL_TABLET | Freq: Every day | ORAL | 3 refills | Status: DC
Start: 2020-07-23 — End: 2020-10-29

## 2020-07-23 MED ORDER — CLOTRIMAZOLE 1 % EX OINT: TOPICAL_OINTMENT | CUTANEOUS | 0 refills | Status: DC

## 2020-07-23 MED ORDER — GABAPENTIN 400 MG PO CAPS: 400.0000 mg | ORAL_CAPSULE | Freq: Every day | ORAL | 3 refills | Status: DC | PRN

## 2020-07-23 NOTE — Assessment & Plan Note (Signed)
Restart glucotrol max in addition to metformin max.

## 2020-07-23 NOTE — Assessment & Plan Note (Signed)
Counseled on weight loss. 

## 2020-07-23 NOTE — Assessment & Plan Note (Signed)
Trial of antifungal. May be basal cell if not improving refer to Derm.

## 2020-07-23 NOTE — Assessment & Plan Note (Signed)
Well controlled. Continue current medication.  

## 2020-07-23 NOTE — Progress Notes (Signed)
Chief Complaint  Patient presents with  . Annual Exam  . Skin Tags  . Abnormal Skin Lesion on Left Arm    History of Present Illness: HPI 59 year old male presents for follow up DM  He has noted dry patch several years ago  Left forearm.. Now in last year.. looks more ring like, with central clearing. No pain, no itching.    Diabetes:   Max metformin... numbers have been high lately. Not eating well... more carb Using medications without difficulties: Hypoglycemic episodes: no Hyperglycemic episodes: yes Feet problems: neuropathy: on gabapentin prn Blood Sugars averaging: FBS 230-250,  Postprandial 250-300 eye exam within last year:yes Wt Readings from Last 3 Encounters:  03/09/20 (!) 317 lb 8 oz (144 kg)  03/02/20 (!) 314 lb 4 oz (142.5 kg)  01/28/20 (!) 317 lb (143.8 kg)     Elevated Cholesterol: LDL at goal on crestor At last check Lab Results  Component Value Date   CHOL 138 03/02/2020   HDL 41.20 03/02/2020   LDLCALC 62 03/02/2020   LDLDIRECT 79.0 12/02/2019   TRIG 175.0 (H) 03/02/2020   CHOLHDL 3 03/02/2020   Using medications without problems: Muscle aches:  Diet compliance: Exercise: Other complaints:  Hypertension:  Blood pressure at goal on HCTZ, lisinorpil BP Readings from Last 3 Encounters:  07/23/20 100/64  03/09/20 100/60  03/02/20 120/74  Using medication without problems or lightheadedness:  none Chest pain with exertion:none Edema:none Short of breath:none Average home BPs: Other issues:   Using electric wheelchair.   This visit occurred during the SARS-CoV-2 public health emergency.  Safety protocols were in place, including screening questions prior to the visit, additional usage of staff PPE, and extensive cleaning of exam room while observing appropriate contact time as indicated for disinfecting solutions.   COVID 19 screen:  No recent travel or known exposure to COVID19 The patient denies respiratory symptoms of COVID 19 at this  time. The importance of social distancing was discussed today.     Review of Systems  Constitutional: Negative for chills and fever.  HENT: Negative for congestion and ear pain.   Eyes: Negative for pain and redness.  Respiratory: Negative for cough and shortness of breath.   Cardiovascular: Negative for chest pain, palpitations and leg swelling.  Gastrointestinal: Negative for abdominal pain, blood in stool, constipation, diarrhea, nausea and vomiting.  Genitourinary: Negative for dysuria.  Musculoskeletal: Negative for falls and myalgias.  Skin: Negative for rash.  Neurological: Negative for dizziness.  Psychiatric/Behavioral: Negative for depression. The patient is not nervous/anxious.       Past Medical History:  Diagnosis Date  . Arthritis   . Depression   . Diabetes mellitus without complication ([place])   . Frequent headaches   . Gout   . Hyperlipidemia   . Hypertension   . Stroke [facility])     reports that he quit smoking about 30 years ago. He has never used smokeless tobacco. He reports current alcohol use. He reports that he does not use drugs.   Current Outpatient Medications:  .  acetaminophen (TYLENOL) 325 MG tablet, Take 325 mg by mouth every 4 (four) hours as needed. , Disp: , Rfl:  .  aspirin 81 MG tablet, Take 81 mg by mouth daily., Disp: , Rfl:  .  blood glucose meter kit and supplies, Dispense based on patient and insurance preference. Use to check blood sugar two times a day., Disp: 1 each, Rfl: 0 .  Cholecalciferol (D3-1000 PO), Take 5 tablets by  mouth daily., Disp: , Rfl:  .  colchicine 0.6 MG tablet, Take 1 tablet (0.6 mg total) by mouth daily as needed., Disp: 30 tablet, Rfl: 1 .  gabapentin (NEURONTIN) 400 MG capsule, Take 400 mg by mouth daily as needed., Disp: , Rfl:  .  hydrochlorothiazide (HYDRODIURIL) 25 MG tablet, Take 1 tablet by mouth once daily, Disp: 90 tablet, Rfl: 0 .  lisinopril (ZESTRIL) 10 MG tablet, Take 1/2 (one-half) tablet by mouth once  daily, Disp: 45 tablet, Rfl: 0 .  metFORMIN (GLUCOPHAGE-XR) 500 MG 24 hr tablet, TAKE 2 TABLETS BY MOUTH IN THE MORNING AND 2 TABLETS AT BEDTIME, Disp: 360 tablet, Rfl: 0 .  methocarbamol (ROBAXIN) 750 MG tablet, Take 1 tablet (750 mg total) by mouth daily as needed for muscle spasms., Disp: 30 tablet, Rfl: 1 .  Multiple Vitamins-Minerals (ZINC PO), Take 1 tablet by mouth daily., Disp: , Rfl:  .  polyethylene glycol (MIRALAX / GLYCOLAX) packet, Take 17 g by mouth daily., Disp: 14 each, Rfl: 0 .  rosuvastatin (CRESTOR) 20 MG tablet, TAKE 1 TABLET BY MOUTH AT BEDTIME, Disp: 90 tablet, Rfl: 0   Observations/Objective: Blood pressure 100/64, pulse 77, height 6' (1.829 m), SpO2 96 %.   Physical Exam Constitutional:      Appearance: He is well-developed. He is obese.  HENT:     Head: Normocephalic.     Right Ear: Hearing normal.     Left Ear: Hearing normal.     Nose: Nose normal.  Neck:     Thyroid: No thyroid mass or thyromegaly.     Vascular: No carotid bruit.     Trachea: Trachea normal.  Cardiovascular:     Rate and Rhythm: Normal rate and regular rhythm.     Pulses: Normal pulses.     Heart sounds: Heart sounds not distant. No murmur heard.  No friction rub. No gallop.      Comments: No peripheral edema Pulmonary:     Effort: Pulmonary effort is normal. No respiratory distress.     Breath sounds: Normal breath sounds.  Skin:    General: Skin is warm and dry.     Findings: No rash.     Comments: Raise round lesion, central clearing left forearm, erythematous with flaky skin.  Psychiatric:        Speech: Speech normal.        Behavior: Behavior normal.        Thought Content: Thought content normal.      Diabetic foot exam: Normal inspection No skin breakdown No calluses  Normal DP pulses Decreased sensation to light touch and monofilament Nails normal  Assessment and Plan Hypertension associated with diabetes ([place]) Well controlled. Continue current  medication.   Diabetes mellitus type 2, uncontrolled, with complications ([place])  Restart glucotrol max in addition to metformin max.  Peripheral autonomic neuropathy due to diabetes mellitus (HCC) Stable control on gabapentin prn.  Morbid obesity ([place]) Encouraged exercise, weight loss, healthy eating habits. Body mass index is 43.06 kg/m.   BMI 40.0-44.9, adult ([place]) Counseled on weight loss.  Erectile dysfunction Discussed ED meds and SE. Not urrently interested in using.  Skin lesion of left arm Trial of antifungal. May be basal cell if not improving refer to Derm.     [name], MD

## 2020-07-23 NOTE — Assessment & Plan Note (Signed)
Discussed ED meds and SE. Not urrently interested in using.

## 2020-07-23 NOTE — Assessment & Plan Note (Signed)
Stable control on gabapentin prn.

## 2020-07-23 NOTE — Assessment & Plan Note (Signed)
Encouraged exercise, weight loss, healthy eating habits. Body mass index is 43.06 kg/m.

## 2020-07-23 NOTE — Patient Instructions (Addendum)
Apply topical antifungal to left arm lesion twice daily , if not better call for a dermatolgy referral.  Start glucotrol in addition to metformin. Increase activity as tolerated and work on low carb diet. Call if interested in ED med.

## 2020-09-03 ENCOUNTER — Other Ambulatory Visit: Payer: Self-pay | Admitting: Family Medicine

## 2020-10-04 ENCOUNTER — Other Ambulatory Visit: Payer: Self-pay | Admitting: Family Medicine

## 2020-10-04 MED ORDER — HYDROCHLOROTHIAZIDE 25 MG PO TABS: 25.0000 mg | ORAL_TABLET | Freq: Every day | ORAL | 1 refills | Status: DC

## 2020-10-04 NOTE — Addendum Note (Signed)
Addended by: Carter Kitten on: 10/04/2020 11:53 AM   Modules accepted: Orders

## 2020-10-06 ENCOUNTER — Other Ambulatory Visit: Payer: Self-pay | Admitting: Family Medicine

## 2020-10-09 ENCOUNTER — Telehealth: Payer: Self-pay | Admitting: Family Medicine

## 2020-10-09 DIAGNOSIS — IMO0002 Reserved for concepts with insufficient information to code with codable children: Secondary | ICD-10-CM

## 2020-10-09 DIAGNOSIS — E1165 Type 2 diabetes mellitus with hyperglycemia: Secondary | ICD-10-CM

## 2020-10-09 NOTE — Telephone Encounter (Signed)
-----   Message from Ellamae Sia sent at 10/04/2020 11:24 AM EST ----- Regarding: Lab orders for Friday, 2.11.22 Lab orders for a 3 month follow up appt.

## 2020-10-22 ENCOUNTER — Other Ambulatory Visit (INDEPENDENT_AMBULATORY_CARE_PROVIDER_SITE_OTHER): Payer: Medicaid Other

## 2020-10-22 ENCOUNTER — Other Ambulatory Visit: Payer: Self-pay | Admitting: Family Medicine

## 2020-10-22 ENCOUNTER — Other Ambulatory Visit: Payer: Self-pay

## 2020-10-22 DIAGNOSIS — E118 Type 2 diabetes mellitus with unspecified complications: Secondary | ICD-10-CM

## 2020-10-22 DIAGNOSIS — E1165 Type 2 diabetes mellitus with hyperglycemia: Secondary | ICD-10-CM | POA: Diagnosis not present

## 2020-10-22 DIAGNOSIS — IMO0002 Reserved for concepts with insufficient information to code with codable children: Secondary | ICD-10-CM

## 2020-10-22 DIAGNOSIS — E785 Hyperlipidemia, unspecified: Secondary | ICD-10-CM

## 2020-10-22 LAB — COMPREHENSIVE METABOLIC PANEL
ALT: 16 U/L (ref 0–53)
AST: 16 U/L (ref 0–37)
Albumin: 4.1 g/dL (ref 3.5–5.2)
Alkaline Phosphatase: 62 U/L (ref 39–117)
BUN: 14 mg/dL (ref 6–23)
CO2: 30 mEq/L (ref 19–32)
Calcium: 9.3 mg/dL (ref 8.4–10.5)
Chloride: 98 mEq/L (ref 96–112)
Creatinine, Ser: 1.03 mg/dL (ref 0.40–1.50)
GFR: 79.26 mL/min (ref >60.00)
Glucose, Bld: 167 mg/dL — ABNORMAL HIGH (ref 70–99)
Potassium: 3.6 mEq/L (ref 3.5–5.1)
Sodium: 137 mEq/L (ref 135–145)
Total Bilirubin: 0.6 mg/dL (ref 0.2–1.2)
Total Protein: 7.5 g/dL (ref 6.0–8.3)

## 2020-10-22 LAB — LIPID PANEL
Cholesterol: 137 mg/dL (ref 0–200)
HDL: 39.8 mg/dL (ref >39.00)
NonHDL: 97.33
Total CHOL/HDL Ratio: 3
Triglycerides: 242 mg/dL — ABNORMAL HIGH (ref 0.0–149.0)
VLDL: 48.4 mg/dL — ABNORMAL HIGH (ref 0.0–40.0)

## 2020-10-22 LAB — LDL CHOLESTEROL, DIRECT: Direct LDL: 74 mg/dL

## 2020-10-25 LAB — HEMOGLOBIN A1C: Hgb A1c MFr Bld: 9 % — ABNORMAL HIGH (ref 4.6–6.5)

## 2020-10-26 NOTE — Progress Notes (Signed)
No critical labs need to be addressed urgently. We will discuss labs in detail at upcoming office visit.   

## 2020-10-29 ENCOUNTER — Other Ambulatory Visit: Payer: Self-pay

## 2020-10-29 ENCOUNTER — Ambulatory Visit (INDEPENDENT_AMBULATORY_CARE_PROVIDER_SITE_OTHER): Payer: Medicaid Other | Admitting: Family Medicine

## 2020-10-29 VITALS — BP 102/62 | HR 75 | Temp 98.6°F | Ht 72.0 in | Wt 325.5 lb

## 2020-10-29 DIAGNOSIS — E1165 Type 2 diabetes mellitus with hyperglycemia: Secondary | ICD-10-CM

## 2020-10-29 DIAGNOSIS — E1169 Type 2 diabetes mellitus with other specified complication: Secondary | ICD-10-CM | POA: Diagnosis not present

## 2020-10-29 DIAGNOSIS — E1159 Type 2 diabetes mellitus with other circulatory complications: Secondary | ICD-10-CM

## 2020-10-29 DIAGNOSIS — E785 Hyperlipidemia, unspecified: Secondary | ICD-10-CM

## 2020-10-29 DIAGNOSIS — R609 Edema, unspecified: Secondary | ICD-10-CM

## 2020-10-29 DIAGNOSIS — E118 Type 2 diabetes mellitus with unspecified complications: Secondary | ICD-10-CM | POA: Diagnosis not present

## 2020-10-29 DIAGNOSIS — I152 Hypertension secondary to endocrine disorders: Secondary | ICD-10-CM

## 2020-10-29 DIAGNOSIS — IMO0002 Reserved for concepts with insufficient information to code with codable children: Secondary | ICD-10-CM

## 2020-10-29 MED ORDER — PIOGLITAZONE HCL 30 MG PO TABS: 30.0000 mg | ORAL_TABLET | Freq: Every day | ORAL | 11 refills | Status: DC

## 2020-10-29 NOTE — Patient Instructions (Addendum)
Continue metformin. Hold glipizide. Start Actos daily.  Wear compression hose as able.  I will look into [facility] options for resources.  Consider Cymbalta as a medication for antidepressant.

## 2020-10-29 NOTE — Progress Notes (Signed)
Patient ID: Breckyn Ticas, male    DOB: 06/25/1961, 60 y.o.   MRN: [id number]  This visit was conducted in person.  BP 102/62   Pulse 75   Temp 98.6 F (37 C) (Temporal)   Ht 6' (1.829 m)   Wt (!) 325 lb 8 oz (147.6 kg)   SpO2 95%   BMI 44.15 kg/m    CC:  Chief Complaint  Patient presents with  . Follow-up    3 month- DM     Subjective:   HPI: Yannick Steuber is a 60 y.o. male presenting on 10/29/2020 for Follow-up (3 month- DM )  Pain in low back bilaterally when lying down at night.. improves with getting up and moving some.   He has been eating more pork lately.. caused swelling in ankles.. better now.  No CP, no SOB.  Diabetes:   Poor control on glipizide 10 mg  and metformin max.  eating more at home... cost of healthy food is an issue. Drinking sodas. Lab Results  Component Value Date   HGBA1C 9.0 (H) 10/22/2020  Using medications without difficulties: Hypoglycemic episodes: Hyperglycemic episodes: Feet problems: neuropathy Blood Sugars averaging: not duechekcing eye exam within last year:     Elevated Cholesterol:  At  Goal on crestor  20 mg daily. Lab Results  Component Value Date   CHOL 137 10/22/2020   HDL 39.80 10/22/2020   LDLCALC 62 03/02/2020   LDLDIRECT 74.0 10/22/2020   TRIG 242.0 (H) 10/22/2020   CHOLHDL 3 10/22/2020  Using medications without problems: Muscle aches:  Diet compliance: moderate Exercise: minimal Other complaints:  Hypertension:   good control on lisinopril 10 mg and HCTZ 25 mg daily BP Readings from Last 3 Encounters:  10/29/20 102/62  07/23/20 100/64  03/09/20 100/60  Using medication without problems or lightheadedness: none Chest pain with exertion:none Edema: see above Short of breath: none Average home BPs: Other issues:  MDD... he is angry he cannot do this he should be able to do. He is crying more lately.  Relevant past medical, surgical, family and social history reviewed and updated as indicated.  Interim medical history since our last visit reviewed. Allergies and medications reviewed and updated. Outpatient Medications Prior to Visit  Medication Sig Dispense Refill  . acetaminophen (TYLENOL) 325 MG tablet Take 325 mg by mouth every 4 (four) hours as needed.     Marland Kitchen aspirin 81 MG tablet Take 81 mg by mouth daily.    . blood glucose meter kit and supplies Dispense based on patient and insurance preference. Use to check blood sugar two times a day. 1 each 0  . Cholecalciferol (D3-1000 PO) Take 5 tablets by mouth daily.    . Clotrimazole 1 % OINT Apply to affected area twice daily. 15 g 0  . colchicine 0.6 MG tablet Take 1 tablet (0.6 mg total) by mouth daily as needed. 30 tablet 1  . gabapentin (NEURONTIN) 400 MG capsule Take 1 capsule (400 mg total) by mouth daily as needed. 90 capsule 3  . glipiZIDE (GLUCOTROL XL) 10 MG 24 hr tablet Take 1 tablet (10 mg total) by mouth daily with breakfast. 90 tablet 3  . hydrochlorothiazide (HYDRODIURIL) 25 MG tablet Take 1 tablet (25 mg total) by mouth daily. 90 tablet 1  . lisinopril (ZESTRIL) 10 MG tablet TAKE ONE-HALF TABLET BY MOUTH ONCE DAILY 45 tablet 1  . metFORMIN (GLUCOPHAGE-XR) 500 MG 24 hr tablet TAKE 2 TABLETS BY MOUTH IN THE MORNING AND 2  TABLETS AT BEDTIME 360 tablet 1  . methocarbamol (ROBAXIN) 750 MG tablet Take 1 tablet (750 mg total) by mouth daily as needed for muscle spasms. 30 tablet 1  . Multiple Vitamins-Minerals (ZINC PO) Take 1 tablet by mouth daily.    . polyethylene glycol (MIRALAX / GLYCOLAX) packet Take 17 g by mouth daily. 14 each 0  . rosuvastatin (CRESTOR) 20 MG tablet TAKE 1 TABLET BY MOUTH AT BEDTIME 90 tablet 0   No facility-administered medications prior to visit.     Per HPI unless specifically indicated in ROS section below Review of Systems  Constitutional: Negative for fatigue and fever.  HENT: Negative for ear pain.   Eyes: Negative for pain.  Respiratory: Negative for cough and shortness of breath.    Cardiovascular: Negative for chest pain, palpitations and leg swelling.  Gastrointestinal: Negative for abdominal pain.  Genitourinary: Negative for dysuria.  Musculoskeletal: Negative for arthralgias.  Neurological: Negative for syncope, light-headedness and headaches.  Psychiatric/Behavioral: Positive for dysphoric mood.   Objective:  BP 102/62   Pulse 75   Temp 98.6 F (37 C) (Temporal)   Ht 6' (1.829 m)   Wt (!) 325 lb 8 oz (147.6 kg)   SpO2 95%   BMI 44.15 kg/m   Wt Readings from Last 3 Encounters:  10/29/20 (!) 325 lb 8 oz (147.6 kg)  03/09/20 (!) 317 lb 8 oz (144 kg)  03/02/20 (!) 314 lb 4 oz (142.5 kg)      Physical Exam Constitutional:      Appearance: He is well-developed. He is obese.  HENT:     Head: Normocephalic.     Right Ear: Hearing normal.     Left Ear: Hearing normal.     Nose: Nose normal.  Neck:     Thyroid: No thyroid mass or thyromegaly.     Vascular: No carotid bruit.     Trachea: Trachea normal.  Cardiovascular:     Rate and Rhythm: Normal rate and regular rhythm.     Pulses: Normal pulses.     Heart sounds: Heart sounds not distant. No murmur heard. No friction rub. No gallop.      Comments: No peripheral edema Pulmonary:     Effort: Pulmonary effort is normal. No respiratory distress.     Breath sounds: Normal breath sounds.  Skin:    General: Skin is warm and dry.     Findings: No rash.  Neurological:     Cranial Nerves: Cranial nerves are intact.     Sensory: Sensory deficit present.     Motor: Weakness, atrophy and abnormal muscle tone present.     Comments: Minimally ambulatory  Psychiatric:        Speech: Speech normal.        Behavior: Behavior normal.        Thought Content: Thought content normal.       Results for orders placed or performed in visit on 10/22/20  Comprehensive metabolic panel  Result Value Ref Range   Sodium 137 135 - 145 mEq/L   Potassium 3.6 3.5 - 5.1 mEq/L   Chloride 98 96 - 112 mEq/L   CO2 30 19  - 32 mEq/L   Glucose, Bld 167 (H) 70 - 99 mg/dL   BUN 14 6 - 23 mg/dL   Creatinine, Ser 1.03 0.40 - 1.50 mg/dL   Total Bilirubin 0.6 0.2 - 1.2 mg/dL   Alkaline Phosphatase 62 39 - 117 U/L   AST 16 0 - 37 U/L  ALT 16 0 - 53 U/L   Total Protein 7.5 6.0 - 8.3 g/dL   Albumin 4.1 3.5 - 5.2 g/dL   GFR 79.26 >60.00 mL/min   Calcium 9.3 8.4 - 10.5 mg/dL  Lipid panel  Result Value Ref Range   Cholesterol 137 0 - 200 mg/dL   Triglycerides 242.0 (H) 0.0 - 149.0 mg/dL   HDL 39.80 >39.00 mg/dL   VLDL 48.4 (H) 0.0 - 40.0 mg/dL   Total CHOL/HDL Ratio 3    NonHDL 97.33   Hemoglobin A1c  Result Value Ref Range   Hgb A1c MFr Bld 9.0 (H) 4.6 - 6.5 %  LDL cholesterol, direct  Result Value Ref Range   Direct LDL 74.0 mg/dL    This visit occurred during the SARS-CoV-2 public health emergency.  Safety protocols were in place, including screening questions prior to the visit, additional usage of staff PPE, and extensive cleaning of exam room while observing appropriate contact time as indicated for disinfecting solutions.   COVID 19 screen:  No recent travel or known exposure to COVID19 The patient denies respiratory symptoms of COVID 19 at this time. The importance of social distancing was discussed today.   Assessment and Plan Problem List Items Addressed This Visit    Diabetes mellitus type 2, uncontrolled, with complications ([place]) - Primary (Chronic)    Poor control on glipizide and metformin max. Noncompliant with lifestyle changes. Encouraged exercise at able( very limited), weight loss, healthy eating habits.  Continue metformin. Hold glipizide. Start Actos daily.  Will look into [facility] med assistance options.  Follow up in 3 months.        Relevant Medications   pioglitazone (ACTOS) 30 MG tablet   Hyperlipidemia associated with type 2 diabetes mellitus (HCC) (Chronic)    Stable, chronic.  Continue current medication.    LDL at goal on crestor  20 mg daily.      Relevant  Medications   pioglitazone (ACTOS) 30 MG tablet   Hypertension associated with diabetes (HCC) (Chronic)    Stable, chronic.  Continue current medication.    Good control on lisinopril 10 mg and HCTZ 25 mg daily       Relevant Medications   pioglitazone (ACTOS) 30 MG tablet   Peripheral edema    Wear compression hose as able.             [name], MD

## 2021-01-05 DIAGNOSIS — R609 Edema, unspecified: Secondary | ICD-10-CM | POA: Insufficient documentation

## 2021-01-05 DIAGNOSIS — R600 Localized edema: Secondary | ICD-10-CM | POA: Insufficient documentation

## 2021-01-05 NOTE — Assessment & Plan Note (Signed)
Stable, chronic.  Continue current medication.    Good control on lisinopril 10 mg and HCTZ 25 mg daily

## 2021-01-05 NOTE — Assessment & Plan Note (Signed)
Wear compression hose as able.

## 2021-01-05 NOTE — Assessment & Plan Note (Signed)
Poor control on glipizide and metformin max. Noncompliant with lifestyle changes. Encouraged exercise at able( very limited), weight loss, healthy eating habits.  Continue metformin. Hold glipizide. Start Actos daily.  Will look into [facility] med assistance options.  Follow up in 3 months.

## 2021-01-05 NOTE — Assessment & Plan Note (Signed)
Stable, chronic.  Continue current medication.    LDL at goal on crestor  20 mg daily.

## 2021-01-26 ENCOUNTER — Other Ambulatory Visit: Payer: Self-pay | Admitting: Family Medicine

## 2021-01-26 DIAGNOSIS — E785 Hyperlipidemia, unspecified: Secondary | ICD-10-CM

## 2021-01-28 ENCOUNTER — Other Ambulatory Visit: Payer: Self-pay

## 2021-01-28 ENCOUNTER — Ambulatory Visit (INDEPENDENT_AMBULATORY_CARE_PROVIDER_SITE_OTHER): Payer: Medicare Other | Admitting: Family Medicine

## 2021-01-28 VITALS — BP 100/60 | HR 78 | Temp 97.9°F | Ht 72.0 in | Wt 341.0 lb

## 2021-01-28 DIAGNOSIS — I152 Hypertension secondary to endocrine disorders: Secondary | ICD-10-CM

## 2021-01-28 DIAGNOSIS — IMO0002 Reserved for concepts with insufficient information to code with codable children: Secondary | ICD-10-CM

## 2021-01-28 DIAGNOSIS — G834 Cauda equina syndrome: Secondary | ICD-10-CM | POA: Diagnosis not present

## 2021-01-28 DIAGNOSIS — M2560 Stiffness of unspecified joint, not elsewhere classified: Secondary | ICD-10-CM | POA: Diagnosis not present

## 2021-01-28 DIAGNOSIS — E118 Type 2 diabetes mellitus with unspecified complications: Secondary | ICD-10-CM | POA: Diagnosis not present

## 2021-01-28 DIAGNOSIS — E1165 Type 2 diabetes mellitus with hyperglycemia: Secondary | ICD-10-CM | POA: Diagnosis not present

## 2021-01-28 DIAGNOSIS — E1159 Type 2 diabetes mellitus with other circulatory complications: Secondary | ICD-10-CM

## 2021-01-28 LAB — POCT GLYCOSYLATED HEMOGLOBIN (HGB A1C): Hemoglobin A1C: 9.1 % — AB (ref 4.0–5.6)

## 2021-01-28 NOTE — Progress Notes (Signed)
Patient ID: Jamie Finley, male    DOB: 01-Nov-1960, 60 y.o.   MRN: [id number]  This visit was conducted in person.  There were no vitals taken for this visit.   CC:  Subjective:   HPI: Jamie Finley is a 60 y.o. male presenting on 01/28/2021 for Follow-up (3 month- DM )  Diabetes:   Poor control despite  metformin max and actos  30 mg start.  ( Held glipizide he was on prior).. started glipizide 2 weeks ago.  No SE to actos. Lab Results  Component Value Date   HGBA1C 9.1 (A) 01/28/2021  Using medications without difficulties: Hypoglycemic episodes:? Hyperglycemic episodes:? Feet problems: no uclers Blood Sugars averaging:  Has not been checking eye exam within last year: yes   He is having trouble eating healthy foods.. trouble cooking for self.. eating out most often. Trying to walk  From house to car with cane.  Hypertension:    Stable control on lisinopril 10 mg daily and HCTZ 25 mg daily BP Readings from Last 3 Encounters:  01/28/21 100/60  10/29/20 102/62  07/23/20 100/64   Using medication without problems or lightheadedness:  Chest pain with exertion: Edema: Short of breath: Average home BPs: Other issues:    Over last several years he has noted worsened decreased mobility of left ankle. Lef tankle turning in at rest, cannot straighten with our forcing. This is causing problems with his already restricted mobility.   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 Interim medical history since our last visit reviewed. Allergies and medications reviewed and updated. Outpatient Medications Prior to Visit  Medication Sig Dispense Refill   acetaminophen (TYLENOL) 325 MG tablet Take 325 mg by mouth every 4 (four) hours as needed.      aspirin 81 MG tablet Take 81 mg by mouth daily.     blood glucose meter kit and  supplies Dispense based on patient and insurance preference. Use to check blood sugar two times a day. 1 each 0   Cholecalciferol (D3-1000 PO) Take 5 tablets by mouth daily.     Clotrimazole 1 % OINT Apply to affected area twice daily. 15 g 0   colchicine 0.6 MG tablet Take 1 tablet (0.6 mg total) by mouth daily as needed. 30 tablet 1   gabapentin (NEURONTIN) 400 MG capsule Take 1 capsule (400 mg total) by mouth daily as needed. 90 capsule 3   hydrochlorothiazide (HYDRODIURIL) 25 MG tablet Take 1 tablet (25 mg total) by mouth daily. 90 tablet 1   lisinopril (ZESTRIL) 10 MG tablet TAKE ONE-HALF TABLET BY MOUTH ONCE DAILY 45 tablet 1   metFORMIN (GLUCOPHAGE-XR) 500 MG 24 hr tablet TAKE 2 TABLETS BY MOUTH IN THE MORNING AND 2 TABLETS AT BEDTIME 360 tablet 1   methocarbamol (ROBAXIN) 750 MG tablet Take 1 tablet (750 mg total) by mouth daily as needed for muscle spasms. 30 tablet 1   Multiple Vitamins-Minerals (ZINC PO) Take 1 tablet by mouth daily.     pioglitazone (ACTOS) 30 MG tablet Take 1 tablet (30 mg total) by mouth daily. 30 tablet 11   polyethylene glycol (MIRALAX / GLYCOLAX) packet Take 17 g by mouth daily. 14 each 0   rosuvastatin (CRESTOR) 20 MG tablet TAKE 1 TABLET BY MOUTH AT BEDTIME 90 tablet 3   No facility-administered medications prior to visit.     Per  HPI unless specifically indicated in ROS section below Review of Systems  Constitutional:  Negative for fatigue and fever.  HENT:  Negative for ear pain.   Eyes:  Negative for pain.  Respiratory:  Negative for cough and shortness of breath.   Cardiovascular:  Negative for chest pain, palpitations and leg swelling.  Gastrointestinal:  Negative for abdominal pain.  Genitourinary:  Negative for dysuria.  Musculoskeletal:  Negative for arthralgias.  Neurological:  Negative for syncope, light-headedness and headaches.  Psychiatric/Behavioral:  Negative for dysphoric mood.   Objective:  There were no vitals taken for this visit.   Wt Readings from Last 3 Encounters:  10/29/20 (!) 325 lb 8 oz (147.6 kg)  03/09/20 (!) 317 lb 8 oz (144 kg)  03/02/20 (!) 314 lb 4 oz (142.5 kg)      Physical Exam Constitutional:      Appearance: He is well-developed. He is obese.  HENT:     Head: Normocephalic.     Right Ear: Hearing normal.     Left Ear: Hearing normal.     Nose: Nose normal.  Neck:     Thyroid: No thyroid mass or thyromegaly.     Vascular: No carotid bruit.     Trachea: Trachea normal.  Cardiovascular:     Rate and Rhythm: Normal rate and regular rhythm.     Pulses: Normal pulses.     Heart sounds: Heart sounds not distant. No murmur heard.   No friction rub. No gallop.     Comments: No peripheral edema Pulmonary:     Effort: Pulmonary effort is normal. No respiratory distress.     Breath sounds: Normal breath sounds.  Musculoskeletal:     Comments: Left ankle in turning, decreased ROM  Skin:    General: Skin is warm and dry.     Findings: No rash.  Neurological:     Cranial Nerves: Cranial nerves are intact.     Sensory: Sensory deficit present.     Motor: Weakness, atrophy and abnormal muscle tone present.     Comments: Minimally ambulatory  Psychiatric:        Speech: Speech normal.        Behavior: Behavior normal.        Thought Content: Thought content normal.      Results for orders placed or performed in visit on 10/22/20  Comprehensive metabolic panel  Result Value Ref Range   Sodium 137 135 - 145 mEq/L   Potassium 3.6 3.5 - 5.1 mEq/L   Chloride 98 96 - 112 mEq/L   CO2 30 19 - 32 mEq/L   Glucose, Bld 167 (H) 70 - 99 mg/dL   BUN 14 6 - 23 mg/dL   Creatinine, Ser 1.03 0.40 - 1.50 mg/dL   Total Bilirubin 0.6 0.2 - 1.2 mg/dL   Alkaline Phosphatase 62 39 - 117 U/L   AST 16 0 - 37 U/L   ALT 16 0 - 53 U/L   Total Protein 7.5 6.0 - 8.3 g/dL   Albumin 4.1 3.5 - 5.2 g/dL   GFR 79.26 >60.00 mL/min   Calcium 9.3 8.4 - 10.5 mg/dL  Lipid panel  Result Value Ref Range   Cholesterol 137 0  - 200 mg/dL   Triglycerides 242.0 (H) 0.0 - 149.0 mg/dL   HDL 39.80 >39.00 mg/dL   VLDL 48.4 (H) 0.0 - 40.0 mg/dL   Total CHOL/HDL Ratio 3    NonHDL 97.33   Hemoglobin A1c  Result Value Ref Range  Hgb A1c MFr Bld 9.0 (H) 4.6 - 6.5 %  LDL cholesterol, direct  Result Value Ref Range   Direct LDL 74.0 mg/dL    This visit occurred during the SARS-CoV-2 public health emergency.  Safety protocols were in place, including screening questions prior to the visit, additional usage of staff PPE, and extensive cleaning of exam room while observing appropriate contact time as indicated for disinfecting solutions.   COVID 19 screen:  No recent travel or known exposure to COVID19 The patient denies respiratory symptoms of COVID 19 at this time. The importance of social distancing was discussed today.   Assessment and Plan    Problem List Items Addressed This Visit     Diabetes mellitus type 2, uncontrolled, with complications ([place]) - Primary (Chronic)    Poor control despite  metformin max and actos  30 mg start.  ( Held glipizide he was on prior).. started glipizide back  2 weeks ago.  Send MyChart message with fasting blood sugars daly and occ 2 hours after meals. FBS goal < 120, 2 hours after meals < 180. Keep up the increased activity. Work on lower carb meals.  Call if interested nutritionist.        Relevant Orders   POCT glycosylated hemoglobin (Hb A1C) (Completed)   Hypertension associated with diabetes (HCC) (Chronic)    Stable, chronic.  Continue current medication.   Stable control on lisinopril 10 mg daily and HCTZ 25 mg daily       Cauda equina syndrome with neurogenic bladder [facility])   Relevant Orders   Ambulatory referral to Physical Therapy   Decreased mobility of joint   Relevant Orders   Ambulatory referral to Physical Therapy     [name], MD

## 2021-01-28 NOTE — Patient Instructions (Addendum)
Send MyChart message with fasting blood sugars daly and occ 2 hours after meals. FBS goal < 120, 2 hours after meals < 180. Keep up the increased activity. Work on lower carb meals.  Call if interested nutritionist. WE will look into referral to PT.

## 2021-02-08 ENCOUNTER — Emergency Department: Payer: Medicare Other

## 2021-02-08 ENCOUNTER — Emergency Department
Admission: EM | Admit: 2021-02-08 | Discharge: 2021-02-08 | Disposition: A | Discharge status: Home / Self Care | Payer: Medicare Other | Attending: Emergency Medicine | Admitting: Emergency Medicine

## 2021-02-08 ENCOUNTER — Other Ambulatory Visit: Payer: Self-pay

## 2021-02-08 DIAGNOSIS — Z7984 Long term (current) use of oral hypoglycemic drugs: Secondary | ICD-10-CM | POA: Insufficient documentation

## 2021-02-08 DIAGNOSIS — E1140 Type 2 diabetes mellitus with diabetic neuropathy, unspecified: Secondary | ICD-10-CM | POA: Diagnosis not present

## 2021-02-08 DIAGNOSIS — Z7982 Long term (current) use of aspirin: Secondary | ICD-10-CM | POA: Diagnosis not present

## 2021-02-08 DIAGNOSIS — I10 Essential (primary) hypertension: Secondary | ICD-10-CM | POA: Insufficient documentation

## 2021-02-08 DIAGNOSIS — Z87891 Personal history of nicotine dependence: Secondary | ICD-10-CM | POA: Insufficient documentation

## 2021-02-08 DIAGNOSIS — R519 Headache, unspecified: Secondary | ICD-10-CM | POA: Diagnosis not present

## 2021-02-08 DIAGNOSIS — Z79899 Other long term (current) drug therapy: Secondary | ICD-10-CM | POA: Insufficient documentation

## 2021-02-08 LAB — COMPREHENSIVE METABOLIC PANEL
ALT: 22 U/L (ref 0–44)
AST: 30 U/L (ref 15–41)
Albumin: 4.1 g/dL (ref 3.5–5.0)
Alkaline Phosphatase: 54 U/L (ref 38–126)
Anion gap: 10 (ref 5–15)
BUN: 34 mg/dL — ABNORMAL HIGH (ref 6–20)
CO2: 26 mmol/L (ref 22–32)
Calcium: 9.3 mg/dL (ref 8.9–10.3)
Chloride: 98 mmol/L (ref 98–111)
Creatinine, Ser: 1.4 mg/dL — ABNORMAL HIGH (ref 0.61–1.24)
GFR, Estimated: 58 mL/min — ABNORMAL LOW (ref >60)
Glucose, Bld: 103 mg/dL — ABNORMAL HIGH (ref 70–99)
Potassium: 3.9 mmol/L (ref 3.5–5.1)
Sodium: 134 mmol/L — ABNORMAL LOW (ref 135–145)
Total Bilirubin: 0.7 mg/dL (ref 0.3–1.2)
Total Protein: 7.4 g/dL (ref 6.5–8.1)

## 2021-02-08 LAB — CBC WITH DIFFERENTIAL/PLATELET
Abs Immature Granulocytes: 0.02 10*3/uL (ref 0.00–0.07)
Basophils Absolute: 0.1 10*3/uL (ref 0.0–0.1)
Basophils Relative: 1 %
Eosinophils Absolute: 0.2 10*3/uL (ref 0.0–0.5)
Eosinophils Relative: 2 %
HCT: 38.8 % — ABNORMAL LOW (ref 39.0–52.0)
Hemoglobin: 12.8 g/dL — ABNORMAL LOW (ref 13.0–17.0)
Immature Granulocytes: 0 %
Lymphocytes Relative: 39 %
Lymphs Abs: 3.5 10*3/uL (ref 0.7–4.0)
MCH: 28.6 pg (ref 26.0–34.0)
MCHC: 33 g/dL (ref 30.0–36.0)
MCV: 86.8 fL (ref 80.0–100.0)
Monocytes Absolute: 0.8 10*3/uL (ref 0.1–1.0)
Monocytes Relative: 9 %
Neutro Abs: 4.4 10*3/uL (ref 1.7–7.7)
Neutrophils Relative %: 49 %
Platelets: 251 10*3/uL (ref 150–400)
RBC: 4.47 MIL/uL (ref 4.22–5.81)
RDW: 14.1 % (ref 11.5–15.5)
WBC: 8.8 10*3/uL (ref 4.0–10.5)
nRBC: 0 % (ref 0.0–0.2)

## 2021-02-08 LAB — TROPONIN I (HIGH SENSITIVITY): Troponin I (High Sensitivity): 4 ng/L (ref <18)

## 2021-02-08 MED ORDER — ACETAMINOPHEN 500 MG PO TABS
1000.0000 mg | ORAL_TABLET | Freq: Once | ORAL | Status: AC
  Administered 2021-02-08: 1000 mg via ORAL
  Filled 2021-02-08: qty 2

## 2021-02-08 NOTE — ED Triage Notes (Signed)
Pt states he has a pressure feeling in his head, he checked his bp at home and it was elevated. Pt states at times he feels like he is getting tunnel vision in both eyes. Pt has no slurred speech, able to move all extremities, no facial droop, no numbness. Pt also states his head feels hot.

## 2021-02-08 NOTE — ED Provider Notes (Addendum)
[facility] Emergency Department Provider Note  ____________________________________________  Time seen: Approximately 3:51 AM  I have reviewed the triage vital signs and the nursing notes.   HISTORY  Chief Complaint Hypertension   HPI Jamie Finley is a 60 y.o. male with a history of diabetes, hypertension, hyperlipidemia, CVA who presents for evaluation of high blood pressure.  Patient reports that his primary care doctor has been managing his blood pressure recently.  He reports that over the last week he has been trying a low-carb diet.  He had steak and shrimp for dinner and think he might have had too much salt into his meal.  After the dinner he started having a throbbing pressure-like headache that was bilaterally associated with feeling off balance and the sensation of fluid in his ears.  He felt that his blood pressure was elevated.  He checks blood pressure at home with systolics in the [address] which made him concerned.  He denies diplopia, dysarthria, dysphagia, slurred speech, difficulty finding words, unilateral weakness or numbness, or facial droop.  Denies chest pain or shortness of breath.  He reports that he dizziness felt like off balance and not lightheaded like he was going to pass out.  At this time he reports his headache has mostly resolved although still mild but has no other symptoms.  He denies history of chronic headaches.   Past Medical History:  Diagnosis Date  . Arthritis   . Depression   . Diabetes mellitus without complication ([place])   . Frequent headaches   . Gout   . Hyperlipidemia   . Hypertension   . Stroke [facility])     Patient Active Problem List   Diagnosis Date Noted  . Decreased mobility of joint 01/28/2021  . Peripheral edema 01/05/2021  . Erectile dysfunction 07/23/2020  . BMI 40.0-44.9, adult ([place]) 07/23/2020  . Cardiomegaly 02/25/2019  . History of DVT (deep vein thrombosis) 12/24/2018  . Cauda equina syndrome  with neurogenic bladder ([place]) 12/10/2018  . Chronic constipation 12/10/2018  . Gout of right foot 09/25/2017  . Bilateral knee pain 08/24/2017  . MDD (recurrent major depressive disorder) in remission ([place]) 08/24/2017  . Chronic low back pain 02/27/2017  . History of CVA (cerebrovascular accident) 01/15/2016  . Hyperlipidemia associated with type 2 diabetes mellitus ([place]) 02/18/2015  . Peripheral autonomic neuropathy due to diabetes mellitus ([place]) 02/18/2015  . Diabetes mellitus type 2, uncontrolled, with complications ([place]) 97/98/9211  . Hypertension associated with diabetes ([place]) 12/15/2014  . Morbid obesity ([place]) 12/15/2014    Past Surgical History:  Procedure Laterality Date  . BACK SURGERY  2007   [facility]  . COLONOSCOPY WITH PROPOFOL N/A 01/27/2020   Procedure: COLONOSCOPY WITH PROPOFOL;  Surgeon: Jonathon Bellows, MD;  Location: [facility] ENDOSCOPY;  Service: Gastroenterology;  Laterality: N/A;  . COLONOSCOPY WITH PROPOFOL N/A 01/28/2020   Procedure: COLONOSCOPY WITH PROPOFOL;  Surgeon: Jonathon Bellows, MD;  Location: [facility] ENDOSCOPY;  Service: Gastroenterology;  Laterality: N/A;  . TONSILLECTOMY      Prior to Admission medications   Medication Sig Start Date End Date Taking? Authorizing Provider  acetaminophen (TYLENOL) 325 MG tablet Take 325 mg by mouth every 4 (four) hours as needed.     [provider]  aspirin 81 MG tablet Take 81 mg by mouth daily.    Roselee Nova, MD  blood glucose meter kit and supplies Dispense based on patient and insurance preference. Use to check blood sugar two times a day. 12/12/18  Bedsole, Amy E, MD  Cholecalciferol (D3-1000 PO) Take 5 tablets by mouth daily.    [provider]  Clotrimazole 1 % OINT Apply to affected area twice daily. 07/23/20   Bedsole, Amy E, MD  colchicine 0.6 MG tablet Take 1 tablet (0.6 mg total) by mouth daily as needed. 09/10/19   Bedsole, Amy E, MD  gabapentin (NEURONTIN) 400 MG capsule Take 1 capsule (400 mg total)  by mouth daily as needed. 07/23/20   Bedsole, Amy E, MD  glipiZIDE (GLUCOTROL XL) 10 MG 24 hr tablet Take 10 mg by mouth every morning. 01/14/21   [provider]  hydrochlorothiazide (HYDRODIURIL) 25 MG tablet Take 1 tablet (25 mg total) by mouth daily. 10/04/20   Bedsole, Amy E, MD  lisinopril (ZESTRIL) 10 MG tablet TAKE ONE-HALF TABLET BY MOUTH ONCE DAILY 10/04/20   Bedsole, Amy E, MD  metFORMIN (GLUCOPHAGE-XR) 500 MG 24 hr tablet TAKE 2 TABLETS BY MOUTH IN THE MORNING AND 2 TABLETS AT BEDTIME 10/04/20   Bedsole, Amy E, MD  methocarbamol (ROBAXIN) 750 MG tablet Take 1 tablet (750 mg total) by mouth daily as needed for muscle spasms. 09/10/19   Bedsole, Amy E, MD  Multiple Vitamins-Minerals (ZINC PO) Take 1 tablet by mouth daily.    [provider]  pioglitazone (ACTOS) 30 MG tablet Take 1 tablet (30 mg total) by mouth daily. 10/29/20   Bedsole, Amy E, MD  polyethylene glycol (MIRALAX / GLYCOLAX) packet Take 17 g by mouth daily. 06/22/18   Bettey Costa, MD  rosuvastatin (CRESTOR) 20 MG tablet TAKE 1 TABLET BY MOUTH AT BEDTIME 01/27/21   Bedsole, Amy E, MD    Allergies Patient has no known allergies.  Family History  Problem Relation Age of Onset  . Cancer Mother        face and jaw  . Cancer Father        kidney  . Early death Father   . Diabetes Maternal Grandmother   . Depression Maternal Grandfather   . Diabetes Maternal Grandfather   . Hearing loss Maternal Grandfather     Social History Social History   Tobacco Use  . Smoking status: Former Smoker    Quit date: 09/11/1989    Years since quitting: 31.4  . Smokeless tobacco: Never Used  Vaping Use  . Vaping Use: Never used  Substance Use Topics  . Alcohol use: Yes    Comment: rarely   . Drug use: No    Review of Systems  Constitutional: Negative for fever. + dizziness Eyes: Negative for visual changes. ENT: Negative for sore throat. Neck: No neck pain  Cardiovascular: Negative for chest  pain. Respiratory: Negative for shortness of breath. Gastrointestinal: Negative for abdominal pain, vomiting or diarrhea. Genitourinary: Negative for dysuria. Musculoskeletal: Negative for back pain. Skin: Negative for rash. Neurological: Negative for weakness or numbness. + HA Psych: No SI or HI  ____________________________________________   PHYSICAL EXAM:  VITAL SIGNS: ED Triage Vitals  Enc Vitals Group     BP 02/08/21 0024 136/71     Pulse Rate 02/08/21 0024 88     Resp 02/08/21 0024 18     Temp 02/08/21 0024 97.9 F (36.6 C)     Temp src --      SpO2 02/08/21 0024 97 %     Weight 02/08/21 0022 (!) 340 lb (154.2 kg)     Height 02/08/21 0022 6' (1.829 m)     Head Circumference --  Peak Flow --      Pain Score 02/08/21 0022 0     Pain Loc --      Pain Edu? --      Excl. in [place]? --     Constitutional: Alert and oriented. Well appearing and in no apparent distress. HEENT:      Head: Normocephalic and atraumatic.         Eyes: Conjunctivae are normal. Sclera is non-icteric.       Mouth/Throat: Mucous membranes are moist.       Neck: Supple with no signs of meningismus. Cardiovascular: Regular rate and rhythm. No murmurs, gallops, or rubs. 2+ symmetrical distal pulses are present in all extremities. No JVD. Respiratory: Normal respiratory effort. Lungs are clear to auscultation bilaterally.  Gastrointestinal: Soft, non tender. Musculoskeletal:  No edema, cyanosis, or erythema of extremities. Neurologic: Normal speech and language. Face is symmetric.  No dysmetria or pronator drift, intact strength and sensation x4, normal gait, EOMI, PERRL Skin: Skin is warm, dry and intact. No rash noted. Psychiatric: Mood and affect are normal. Speech and behavior are normal.  ____________________________________________   LABS (all labs ordered are listed, but only abnormal results are displayed)  Labs Reviewed  CBC WITH DIFFERENTIAL/PLATELET - Abnormal; Notable for the  following components:      Result Value   Hemoglobin 12.8 (*)    HCT 38.8 (*)    All other components within normal limits  COMPREHENSIVE METABOLIC PANEL - Abnormal; Notable for the following components:   Sodium 134 (*)    Glucose, Bld 103 (*)    BUN 34 (*)    Creatinine, Ser 1.40 (*)    GFR, Estimated 58 (*)    All other components within normal limits  TROPONIN I (HIGH SENSITIVITY)  TROPONIN I (HIGH SENSITIVITY)   ____________________________________________  EKG  ED ECG REPORT I, Rudene Re, the attending physician, personally viewed and interpreted this ECG.  Sinus rhythm, rate of 79, normal intervals, left axis deviation, low voltage QRS no ST elevations or depressions.  No significant changes when compared to prior from 20 ____________________________________________  RADIOLOGY  I have personally reviewed the images performed during this visit and I agree with the Radiologist's read.   Interpretation by Radiologist:  CT Head Wo Contrast  Result Date: 02/08/2021 CLINICAL DATA:  Head pressure, hypertension, altered vision EXAM: CT HEAD WITHOUT CONTRAST TECHNIQUE: Contiguous axial images were obtained from the base of the skull through the vertex without intravenous contrast. COMPARISON:  01/15/2016 FINDINGS: Brain: No acute infarct or hemorrhage. Lateral ventricles and midline structures are unremarkable. No acute extra-axial fluid collections. No mass effect. Vascular: No hyperdense vessel or unexpected calcification. Skull: Normal. Negative for fracture or focal lesion. Sinuses/Orbits: No acute finding. Other: None. IMPRESSION: 1. No acute intracranial process. Electronically Signed   By: Randa Ngo M.D.   On: 02/08/2021 03:51      ____________________________________________   PROCEDURES  Procedure(s) performed: None Procedures Critical Care performed:  None ____________________________________________   INITIAL IMPRESSION / ASSESSMENT AND PLAN / ED  COURSE   60 y.o. male with a history of diabetes, hypertension, hyperlipidemia, CVA who presents for evaluation of high blood pressure, pressure like HA, and dizziness.  Patient feels improved during my evaluation.  BP in normal, completely normal neurological exam with no signs of stroke.  EKG with no signs of demand ischemia.  Possibly symptoms caused by increased salt on patient's dinner.  Blood work does show a mild elevated creatinine that went from  1.04 to 1.40.  No significant electrolyte derangements.  Troponin is negative with no signs of demand ischemia.  Head CT visualized by me with no acute findings, confirmed by radiology.  Patient given Tylenol with resolution of his symptoms.  Repeat BP of 103/69.  Remains asymptomatic.  Will discharge home with follow-up with PCP.  Discussed my standard return precautions and close monitoring of blood pressure at home.  Discussed monitoring salt intake.  I also did discuss with the patient the abnormality on his creatinine.  Recommended increase oral hydration for the next week and follow-up with his PCP for repeat blood work.  He might need to have his hydrochlorothiazide adjusted.     _____________________________________________ Please note:  Patient was evaluated in Emergency Department today for the symptoms described in the history of present illness. Patient was evaluated in the context of the global COVID-19 pandemic, which necessitated consideration that the patient might be at risk for infection with the SARS-CoV-2 virus that causes COVID-19. Institutional protocols and algorithms that pertain to the evaluation of patients at risk for COVID-19 are in a state of rapid change based on information released by regulatory bodies including the CDC and federal and state organizations. These policies and algorithms were followed during the patient's care in the ED.  Some ED evaluations and interventions may be delayed as a result of limited staffing  during the pandemic.   [place] Controlled Substance Database was reviewed by me. ____________________________________________   FINAL CLINICAL IMPRESSION(S) / ED DIAGNOSES   Final diagnoses:  Primary hypertension      NEW MEDICATIONS STARTED DURING THIS VISIT:  ED Discharge Orders    None       Note:  This document was prepared using Dragon voice recognition software and may include unintentional dictation errors.    Alfred Levins, [place], MD 02/08/21 [place], [place], [place] 02/08/21 [phone]

## 2021-02-15 ENCOUNTER — Ambulatory Visit (INDEPENDENT_AMBULATORY_CARE_PROVIDER_SITE_OTHER): Payer: Medicare Other | Admitting: Family Medicine

## 2021-02-15 ENCOUNTER — Other Ambulatory Visit: Payer: Self-pay

## 2021-02-15 ENCOUNTER — Encounter: Payer: Self-pay | Admitting: Family Medicine

## 2021-02-15 VITALS — BP 90/60 | HR 72 | Temp 98.0°F | Ht 72.0 in | Wt 335.2 lb

## 2021-02-15 DIAGNOSIS — E871 Hypo-osmolality and hyponatremia: Secondary | ICD-10-CM | POA: Diagnosis not present

## 2021-02-15 DIAGNOSIS — W19XXXD Unspecified fall, subsequent encounter: Secondary | ICD-10-CM

## 2021-02-15 DIAGNOSIS — M545 Low back pain, unspecified: Secondary | ICD-10-CM | POA: Diagnosis not present

## 2021-02-15 DIAGNOSIS — I152 Hypertension secondary to endocrine disorders: Secondary | ICD-10-CM

## 2021-02-15 DIAGNOSIS — E1159 Type 2 diabetes mellitus with other circulatory complications: Secondary | ICD-10-CM

## 2021-02-15 NOTE — Patient Instructions (Addendum)
Please stop at the lab to have labs drawn.  Follow BP on arm cuff daily... call if < 90/60 or > 140/90.  Work on gentle stretching, start heat and can use tylneol prn.  Call if low back pain worsening.

## 2021-02-15 NOTE — Progress Notes (Signed)
Patient ID: Jamie Finley, male    DOB: [birth year], 60 y.o.   MRN: [id number]  This visit was conducted in person.  BP 90/60   Pulse 72   Temp 98 F (36.7 C) (Temporal)   Ht 6' (1.829 m)   Wt (!) 335 lb 4 oz (152.1 kg)   SpO2 97%   BMI 45.47 kg/m    CC:  Chief Complaint  Patient presents with   Hospitalization Follow-up   Fall    On Saturday    Subjective:   HPI: Jamie Finley is a 60 y.o. male presenting on 02/15/2021 for Hospitalization Follow-up and Fall (On Saturday)  Seen in ER on 02/08/2021 for elevated blood pressure after a high salt meal. Note and studies reviewed in detail today. Associated with headache, and dizziness,  no cp, no sob.  Labs eval showed low sodium 134 and slightly elevated creatinine at 1.4 above baseline  Hg slight low at 12.8, wbc normal Head CT: no acute intracranial processess    EKG unremarkable   No meds change , but encouraged increase in hydration BP Readings from Last 3 Encounters:  02/15/21 90/60  02/08/21 122/71  01/28/21 100/60   On lisinopril 5 mg daily, HCTZ 25 mg daily  Wt Readings from Last 3 Encounters:  02/15/21 (!) 335 lb 4 oz (152.1 kg)  02/08/21 (!) 340 lb (154.2 kg)  01/28/21 (!) 341 lb (154.7 kg)  he has been working very hard on low carb diet.  Had a fall few days  01/12/21.Marland Kitchen lost balance tieing shoes ( no dizzinessness)... sat down on ground very hard of buttocks. No immediate pain,  Sore in buttock and back of thigh. Feels less stable on feet.   no further pressure in head, no dizziness, no chest pain, no sob, minimal swelling in legs.  Relevant past medical, surgical, family and social history reviewed and updated as indicated. Interim medical history since our last visit reviewed. Allergies and medications reviewed and updated. Outpatient Medications Prior to Visit  Medication Sig Dispense Refill   acetaminophen (TYLENOL) 325 MG tablet Take 325 mg by mouth every 4 (four) hours as needed.      aspirin  81 MG tablet Take 81 mg by mouth daily.     blood glucose meter kit and supplies Dispense based on patient and insurance preference. Use to check blood sugar two times a day. 1 each 0   Cholecalciferol (D3-1000 PO) Take 5 tablets by mouth daily.     Clotrimazole 1 % OINT Apply to affected area twice daily. 15 g 0   colchicine 0.6 MG tablet Take 1 tablet (0.6 mg total) by mouth daily as needed. 30 tablet 1   gabapentin (NEURONTIN) 400 MG capsule Take 1 capsule (400 mg total) by mouth daily as needed. 90 capsule 3   glipiZIDE (GLUCOTROL XL) 10 MG 24 hr tablet Take 10 mg by mouth every morning.     hydrochlorothiazide (HYDRODIURIL) 25 MG tablet Take 1 tablet (25 mg total) by mouth daily. 90 tablet 1   lisinopril (ZESTRIL) 10 MG tablet TAKE ONE-HALF TABLET BY MOUTH ONCE DAILY 45 tablet 1   metFORMIN (GLUCOPHAGE-XR) 500 MG 24 hr tablet TAKE 2 TABLETS BY MOUTH IN THE MORNING AND 2 TABLETS AT BEDTIME 360 tablet 1   methocarbamol (ROBAXIN) 750 MG tablet Take 1 tablet (750 mg total) by mouth daily as needed for muscle spasms. 30 tablet 1   Multiple Vitamins-Minerals (ZINC PO) Take 1 tablet by mouth daily.  pioglitazone (ACTOS) 30 MG tablet Take 1 tablet (30 mg total) by mouth daily. 30 tablet 11   polyethylene glycol (MIRALAX / GLYCOLAX) packet Take 17 g by mouth daily. 14 each 0   rosuvastatin (CRESTOR) 20 MG tablet TAKE 1 TABLET BY MOUTH AT BEDTIME 90 tablet 3   No facility-administered medications prior to visit.     Per HPI unless specifically indicated in ROS section below Review of Systems  All other systems reviewed and are negative. Objective:  BP 90/60   Pulse 72   Temp 98 F (36.7 C) (Temporal)   Ht 6' (1.829 m)   Wt (!) 335 lb 4 oz (152.1 kg)   SpO2 97%   BMI 45.47 kg/m   Wt Readings from Last 3 Encounters:  02/15/21 (!) 335 lb 4 oz (152.1 kg)  02/08/21 (!) 340 lb (154.2 kg)  01/28/21 (!) 341 lb (154.7 kg)      Physical Exam Constitutional:      Appearance: He is  well-developed. He is obese.     Comments: In wheelchair  HENT:     Head: Normocephalic.     Right Ear: Hearing normal.     Left Ear: Hearing normal.     Nose: Nose normal.  Neck:     Thyroid: No thyroid mass or thyromegaly.     Vascular: No carotid bruit.     Trachea: Trachea normal.  Cardiovascular:     Rate and Rhythm: Normal rate and regular rhythm.     Pulses: Normal pulses.     Heart sounds: Heart sounds not distant. No murmur heard.   No friction rub. No gallop.     Comments: No peripheral edema Pulmonary:     Effort: Pulmonary effort is normal. No respiratory distress.     Breath sounds: Normal breath sounds.  Musculoskeletal:     Lumbar back: Tenderness present. No swelling, deformity, lacerations, spasms or bony tenderness. Decreased range of motion. Negative right straight leg raise test and negative left straight leg raise test.     Comments: Left ankle in turning, decreased ROM  Skin:    General: Skin is warm and dry.     Findings: No rash.  Neurological:     Cranial Nerves: Cranial nerves are intact.     Sensory: Sensory deficit present.     Motor: Weakness, atrophy and abnormal muscle tone present.     Comments: Minimally ambulatory  Psychiatric:        Speech: Speech normal.        Behavior: Behavior normal.        Thought Content: Thought content normal.      Results for orders placed or performed during the hospital encounter of 02/08/21  CBC with Differential  Result Value Ref Range   WBC 8.8 4.0 - 10.5 K/uL   RBC 4.47 4.22 - 5.81 MIL/uL   Hemoglobin 12.8 (L) 13.0 - 17.0 g/dL   HCT 38.8 (L) 39.0 - 52.0 %   MCV 86.8 80.0 - 100.0 fL   MCH 28.6 26.0 - 34.0 pg   MCHC 33.0 30.0 - 36.0 g/dL   RDW 14.1 11.5 - 15.5 %   Platelets 251 150 - 400 K/uL   nRBC 0.0 0.0 - 0.2 %   Neutrophils Relative % 49 %   Neutro Abs 4.4 1.7 - 7.7 K/uL   Lymphocytes Relative 39 %   Lymphs Abs 3.5 0.7 - 4.0 K/uL   Monocytes Relative 9 %   Monocytes Absolute 0.8 0.1 -  1.0  K/uL   Eosinophils Relative 2 %   Eosinophils Absolute 0.2 0.0 - 0.5 K/uL   Basophils Relative 1 %   Basophils Absolute 0.1 0.0 - 0.1 K/uL   Immature Granulocytes 0 %   Abs Immature Granulocytes 0.02 0.00 - 0.07 K/uL  Comprehensive metabolic panel  Result Value Ref Range   Sodium 134 (L) 135 - 145 mmol/L   Potassium 3.9 3.5 - 5.1 mmol/L   Chloride 98 98 - 111 mmol/L   CO2 26 22 - 32 mmol/L   Glucose, Bld 103 (H) 70 - 99 mg/dL   BUN 34 (H) 6 - 20 mg/dL   Creatinine, Ser 1.40 (H) 0.61 - 1.24 mg/dL   Calcium 9.3 8.9 - 10.3 mg/dL   Total Protein 7.4 6.5 - 8.1 g/dL   Albumin 4.1 3.5 - 5.0 g/dL   AST 30 15 - 41 U/L   ALT 22 0 - 44 U/L   Alkaline Phosphatase 54 38 - 126 U/L   Total Bilirubin 0.7 0.3 - 1.2 mg/dL   GFR, Estimated 58 (L) >60 mL/min   Anion gap 10 5 - 15  Troponin I (High Sensitivity)  Result Value Ref Range   Troponin I (High Sensitivity) 4 <18 ng/L    This visit occurred during the SARS-CoV-2 public health emergency.  Safety protocols were in place, including screening questions prior to the visit, additional usage of staff PPE, and extensive cleaning of exam room while observing appropriate contact time as indicated for disinfecting solutions.   COVID 19 screen:  No recent travel or known exposure to COVID19 The patient denies respiratory symptoms of COVID 19 at this time. The importance of social distancing was discussed today.   Assessment and Plan    Problem List Items Addressed This Visit     Hypertension associated with diabetes ([place]) (Chronic)     Acute, worsening   Spike, now resolved. Now low normal.  Work on low salt diet. Follow BP on arm cuff daily... call if < 90/60 or > 140/90.   On lisinopril 5 mg daily, HCTZ 25 mg daily      Accidental fall    Reviewed home safety.      Acute midline low back pain without sciatica    Due to bruising from fall likely. Work on gentle stretching, start heat and can use tylneol prn.  Call if low back pain  worsening.      Hyponatremia - Primary    Likely due to HCTZ... re-evla with labs.      Relevant Orders   Basic metabolic panel (Completed)   Orders Placed This Encounter  Procedures   Basic metabolic panel    Order Specific Question:   Has the patient fasted?    Answer:   No     [name], MD

## 2021-02-16 LAB — BASIC METABOLIC PANEL
BUN: 26 mg/dL — ABNORMAL HIGH (ref 6–23)
CO2: 28 mEq/L (ref 19–32)
Calcium: 9.1 mg/dL (ref 8.4–10.5)
Chloride: 101 mEq/L (ref 96–112)
Creatinine, Ser: 1.27 mg/dL (ref 0.40–1.50)
GFR: 61.5 mL/min (ref >60.00)
Glucose, Bld: 63 mg/dL — ABNORMAL LOW (ref 70–99)
Potassium: 4 mEq/L (ref 3.5–5.1)
Sodium: 138 mEq/L (ref 135–145)

## 2021-02-22 ENCOUNTER — Emergency Department
Admission: EM | Admit: 2021-02-22 | Discharge: 2021-02-22 | Disposition: A | Discharge status: Home / Self Care | Payer: Medicare Other | Attending: Emergency Medicine | Admitting: Emergency Medicine

## 2021-02-22 ENCOUNTER — Other Ambulatory Visit: Payer: Self-pay

## 2021-02-22 ENCOUNTER — Telehealth: Payer: Self-pay | Admitting: Family Medicine

## 2021-02-22 DIAGNOSIS — Z79899 Other long term (current) drug therapy: Secondary | ICD-10-CM | POA: Insufficient documentation

## 2021-02-22 DIAGNOSIS — I10 Essential (primary) hypertension: Secondary | ICD-10-CM | POA: Diagnosis not present

## 2021-02-22 DIAGNOSIS — Z7982 Long term (current) use of aspirin: Secondary | ICD-10-CM | POA: Insufficient documentation

## 2021-02-22 DIAGNOSIS — E119 Type 2 diabetes mellitus without complications: Secondary | ICD-10-CM | POA: Diagnosis not present

## 2021-02-22 DIAGNOSIS — Z87891 Personal history of nicotine dependence: Secondary | ICD-10-CM | POA: Insufficient documentation

## 2021-02-22 DIAGNOSIS — R55 Syncope and collapse: Secondary | ICD-10-CM | POA: Insufficient documentation

## 2021-02-22 DIAGNOSIS — Z7984 Long term (current) use of oral hypoglycemic drugs: Secondary | ICD-10-CM | POA: Diagnosis not present

## 2021-02-22 LAB — COMPREHENSIVE METABOLIC PANEL
ALT: 16 U/L (ref 0–44)
AST: 25 U/L (ref 15–41)
Albumin: 4.2 g/dL (ref 3.5–5.0)
Alkaline Phosphatase: 51 U/L (ref 38–126)
Anion gap: 6 (ref 5–15)
BUN: 24 mg/dL — ABNORMAL HIGH (ref 6–20)
CO2: 28 mmol/L (ref 22–32)
Calcium: 9.3 mg/dL (ref 8.9–10.3)
Chloride: 101 mmol/L (ref 98–111)
Creatinine, Ser: 1.29 mg/dL — ABNORMAL HIGH (ref 0.61–1.24)
GFR, Estimated: >60 mL/min (ref >60)
Glucose, Bld: 75 mg/dL (ref 70–99)
Potassium: 4.2 mmol/L (ref 3.5–5.1)
Sodium: 135 mmol/L (ref 135–145)
Total Bilirubin: 1 mg/dL (ref 0.3–1.2)
Total Protein: 7.3 g/dL (ref 6.5–8.1)

## 2021-02-22 LAB — CBG MONITORING, ED
Glucose-Capillary: 76 mg/dL (ref 70–99)
Glucose-Capillary: 175 mg/dL — ABNORMAL HIGH (ref 70–99)

## 2021-02-22 LAB — CBC
HCT: 39.1 % (ref 39.0–52.0)
Hemoglobin: 12.9 g/dL — ABNORMAL LOW (ref 13.0–17.0)
MCH: 28.8 pg (ref 26.0–34.0)
MCHC: 33 g/dL (ref 30.0–36.0)
MCV: 87.3 fL (ref 80.0–100.0)
Platelets: 255 10*3/uL (ref 150–400)
RBC: 4.48 MIL/uL (ref 4.22–5.81)
RDW: 14.1 % (ref 11.5–15.5)
WBC: 9.1 10*3/uL (ref 4.0–10.5)
nRBC: 0 % (ref 0.0–0.2)

## 2021-02-22 LAB — TROPONIN I (HIGH SENSITIVITY)
Troponin I (High Sensitivity): 4 ng/L (ref <18)
Troponin I (High Sensitivity): 3 ng/L (ref <18)

## 2021-02-22 MED ORDER — ONDANSETRON HCL 4 MG/2ML IJ SOLN
4.0000 mg | Freq: Once | INTRAMUSCULAR | Status: AC
  Administered 2021-02-22: 4 mg via INTRAVENOUS
  Filled 2021-02-22: qty 2

## 2021-02-22 MED ORDER — DEXTROSE 5 % IN LACTATED RINGERS IV BOLUS
1000.0000 mL | Freq: Once | INTRAVENOUS | Status: AC
  Administered 2021-02-22: 1000 mL via INTRAVENOUS

## 2021-02-22 NOTE — ED Notes (Signed)
Patient states that he still feels dizzy but is improved. Patient denies nausea currently. Patient remains alert and oriented x4. Patient provided with urinal per request.

## 2021-02-22 NOTE — Telephone Encounter (Signed)
Left message for Jamie Finley that Dr. Diona Browner got his note from his ER Visit and would like him to follow up with her on Friday.  I advised that I have put him on the schedule for 02/25/2021 at 10:40 am.  I ask that he call back to reschedule if this time does not work for him.

## 2021-02-22 NOTE — Telephone Encounter (Signed)
Pt called back and confirmed that appt time and date works for him and he will be here   [place] to [employer]

## 2021-02-22 NOTE — ED Provider Notes (Signed)
[facility] Emergency Department Provider Note  ____________________________________________  Time seen: Approximately 5:29 AM  I have reviewed the triage vital signs and the nursing notes.   HISTORY  Chief Complaint Dizziness   HPI Jamie Finley is a 60 y.o. male with a history of diabetes, hypertension, hyperlipidemia, stroke, obesity who presents for evaluation of near syncope.  Patient reports being in his usual state of health when he went to sleep last night.  At midnight he woke up to go to the bathroom.  He reports that every time he stood up he felt lightheaded like he was going to pass out.  He denies vertigo or room spinning sensation.  He denies chest pain, shortness of breath, vomiting or diarrhea, headache, slurred speech, diplopia, dysarthria, dysphagia, unilateral weakness or numbness.  Patient reports that he has changed his diet to minimal carbs for the last 30 days.  Has lost about 20 pounds.  He is on pioglitazone, glipizide, and metformin for his diabetes.  He is also on hydrochlorothiazide and lisinopril for his blood pressure.   Past Medical History:  Diagnosis Date   Arthritis    Depression    Diabetes mellitus without complication ([place])    Frequent headaches    Gout    Hyperlipidemia    Hypertension    Stroke [facility])     Patient Active Problem List   Diagnosis Date Noted   Decreased mobility of joint 01/28/2021   Peripheral edema 01/05/2021   Erectile dysfunction 07/23/2020   BMI 40.0-44.9, adult ([place]) 07/23/2020   Cardiomegaly 02/25/2019   History of DVT (deep vein thrombosis) 12/24/2018   Cauda equina syndrome with neurogenic bladder ([place]) 12/10/2018   Chronic constipation 12/10/2018   Gout of right foot 09/25/2017   Bilateral knee pain 08/24/2017   MDD (recurrent major depressive disorder) in remission ([place]) 08/24/2017   Chronic low back pain 02/27/2017   History of CVA (cerebrovascular accident) 01/15/2016    Hyperlipidemia associated with type 2 diabetes mellitus ([place]) 02/18/2015   Peripheral autonomic neuropathy due to diabetes mellitus ([place]) 02/18/2015   Diabetes mellitus type 2, uncontrolled, with complications ([place]) 51/10/5850   Hypertension associated with diabetes ([place]) 12/15/2014   Morbid obesity ([place]) 12/15/2014    Past Surgical History:  Procedure Laterality Date   BACK SURGERY  2007   [facility]   COLONOSCOPY WITH PROPOFOL N/A 01/27/2020   Procedure: COLONOSCOPY WITH PROPOFOL;  Surgeon: Jonathon Bellows, MD;  Location: [facility] ENDOSCOPY;  Service: Gastroenterology;  Laterality: N/A;   COLONOSCOPY WITH PROPOFOL N/A 01/28/2020   Procedure: COLONOSCOPY WITH PROPOFOL;  Surgeon: Jonathon Bellows, MD;  Location: [facility] ENDOSCOPY;  Service: Gastroenterology;  Laterality: N/A;   TONSILLECTOMY      Prior to Admission medications   Medication Sig Start Date End Date Taking? Authorizing Provider  acetaminophen (TYLENOL) 325 MG tablet Take 325 mg by mouth every 4 (four) hours as needed.     [provider]  aspirin 81 MG tablet Take 81 mg by mouth daily.    Roselee Nova, MD  blood glucose meter kit and supplies Dispense based on patient and insurance preference. Use to check blood sugar two times a day. 12/12/18   Bedsole, Amy E, MD  Cholecalciferol (D3-1000 PO) Take 5 tablets by mouth daily.    [provider]  Clotrimazole 1 % OINT Apply to affected area twice daily. 07/23/20   Bedsole, Amy E, MD  colchicine 0.6 MG tablet Take 1 tablet (0.6 mg total) by mouth daily  as needed. 09/10/19   Bedsole, Amy E, MD  gabapentin (NEURONTIN) 400 MG capsule Take 1 capsule (400 mg total) by mouth daily as needed. 07/23/20   Bedsole, Amy E, MD  glipiZIDE (GLUCOTROL XL) 10 MG 24 hr tablet Take 10 mg by mouth every morning. 01/14/21   [provider]  hydrochlorothiazide (HYDRODIURIL) 25 MG tablet Take 1 tablet (25 mg total) by mouth daily. 10/04/20   Bedsole, Amy E, MD  lisinopril (ZESTRIL) 10 MG tablet  TAKE ONE-HALF TABLET BY MOUTH ONCE DAILY 10/04/20   Bedsole, Amy E, MD  metFORMIN (GLUCOPHAGE-XR) 500 MG 24 hr tablet TAKE 2 TABLETS BY MOUTH IN THE MORNING AND 2 TABLETS AT BEDTIME 10/04/20   Bedsole, Amy E, MD  methocarbamol (ROBAXIN) 750 MG tablet Take 1 tablet (750 mg total) by mouth daily as needed for muscle spasms. 09/10/19   Bedsole, Amy E, MD  Multiple Vitamins-Minerals (ZINC PO) Take 1 tablet by mouth daily.    [provider]  pioglitazone (ACTOS) 30 MG tablet Take 1 tablet (30 mg total) by mouth daily. 10/29/20   Bedsole, Amy E, MD  polyethylene glycol (MIRALAX / GLYCOLAX) packet Take 17 g by mouth daily. 06/22/18   Bettey Costa, MD  rosuvastatin (CRESTOR) 20 MG tablet TAKE 1 TABLET BY MOUTH AT BEDTIME 01/27/21   Bedsole, Amy E, MD    Allergies Patient has no known allergies.  Family History  Problem Relation Age of Onset   Cancer Mother        face and jaw   Cancer Father        kidney   Early death Father    Diabetes Maternal Grandmother    Depression Maternal Grandfather    Diabetes Maternal Grandfather    Hearing loss Maternal Grandfather     Social History Social History   Tobacco Use   Smoking status: Former    Pack years: 0.00    Types: Cigarettes    Quit date: 09/11/1989    Years since quitting: 31.4   Smokeless tobacco: Never  Vaping Use   Vaping Use: Never used  Substance Use Topics   Alcohol use: Yes    Comment: rarely    Drug use: No    Review of Systems  Constitutional: Negative for fever. + near syncope Eyes: Negative for visual changes. ENT: Negative for sore throat. Neck: No neck pain  Cardiovascular: Negative for chest pain. Respiratory: Negative for shortness of breath. Gastrointestinal: Negative for abdominal pain, vomiting or diarrhea. Genitourinary: Negative for dysuria. Musculoskeletal: Negative for back pain. Skin: Negative for rash. Neurological: Negative for headaches, weakness or numbness. Psych: No SI or  HI  ____________________________________________   PHYSICAL EXAM:  VITAL SIGNS: ED Triage Vitals  Enc Vitals Group     BP 02/22/21 0241 109/79     Pulse Rate 02/22/21 0241 78     Resp 02/22/21 0241 20     Temp 02/22/21 0241 98 F (36.7 C)     Temp Source 02/22/21 0241 Oral     SpO2 02/22/21 0241 97 %     Weight 02/22/21 0242 (!) 333 lb (151 kg)     Height 02/22/21 0242 6' (1.829 m)     Head Circumference --      Peak Flow --      Pain Score 02/22/21 0241 7     Pain Loc --      Pain Edu? --      Excl. in [place]? --     Constitutional:  Alert and oriented. Well appearing and in no apparent distress. HEENT:      Head: Normocephalic and atraumatic.         Eyes: Conjunctivae are normal. Sclera is non-icteric.       Mouth/Throat: Mucous membranes are moist.       Neck: Supple with no signs of meningismus. Cardiovascular: Regular rate and rhythm. No murmurs, gallops, or rubs. 2+ symmetrical distal pulses are present in all extremities. No JVD. Respiratory: Normal respiratory effort. Lungs are clear to auscultation bilaterally.  Gastrointestinal: Soft, non tender, and non distended with positive bowel sounds. No rebound or guarding. Genitourinary: No CVA tenderness. Musculoskeletal:  No edema, cyanosis, or erythema of extremities. Neurologic: Normal speech and language. Face is symmetric.  Intact strength and sensation x4, no pronator drift, no dysmetria skin: Skin is warm, dry and intact. No rash noted. Psychiatric: Mood and affect are normal. Speech and behavior are normal.  ____________________________________________   LABS (all labs ordered are listed, but only abnormal results are displayed)  Labs Reviewed  CBC - Abnormal; Notable for the following components:      Result Value   Hemoglobin 12.9 (*)    All other components within normal limits  COMPREHENSIVE METABOLIC PANEL - Abnormal; Notable for the following components:   BUN 24 (*)    Creatinine, Ser 1.29 (*)     All other components within normal limits  CBG MONITORING, ED - Abnormal; Notable for the following components:   Glucose-Capillary 175 (*)    All other components within normal limits  CBG MONITORING, ED  TROPONIN I (HIGH SENSITIVITY)  TROPONIN I (HIGH SENSITIVITY)   ____________________________________________  EKG  ED ECG REPORT I, Rudene Re, the attending physician, personally viewed and interpreted this ECG.  Sinus rhythm with a rate of 73, low voltage QRS, no ST elevations or depressions.  Unchanged from prior from May 2020 ____________________________________________  RADIOLOGY  none   ____________________________________________   PROCEDURES  Procedure(s) performed:yes .1-3 Lead EKG Interpretation  Date/Time: 02/22/2021 5:33 AM Performed by: Rudene Re, MD Authorized by: Rudene Re, MD     Interpretation: non-specific     ECG rate assessment: normal     Rhythm: sinus rhythm     Ectopy: none     Conduction: normal     Critical Care performed:  None ____________________________________________   INITIAL IMPRESSION / ASSESSMENT AND PLAN / ED COURSE  60 y.o. male with a history of diabetes, hypertension, hyperlipidemia, stroke, obesity who presents for evaluation of near syncope.  Patient reports pretty significant change in his diet and not eating a lot of carbs.  Has lost some weight already.  He does report having popcorn and a soda at the movie theater last night.  Woke up at midnight feeling like he was going to pass out every time he stood up.  Here patient was orthostatic.  Initial BP of 109/79, initial blood glucose of 76.  EKG showing no signs of ischemia or dysrhythmias.  Patient denying any chest pain.  Troponin was negative.  Labs showing stable kidney function, no significant electrolyte derangements, stable mild anemia.  Patient was given a liter bolus of LR D5 with resolution of his symptoms.  No longer orthostatic.  Repeat  blood glucose of 175.  No neurological deficits, no vertigo.  May be patient's new dietary changes and weight loss on top of the aggressive regimen that he is on for his high blood pressure and diabetes are causing patient to become hypoglycemic and hypotensive  at home.  We discussed holding pioglitazone and glipizide, continuing metformin for his diabetes.  We also discussed holding hydrochlorothiazide and continuing the lisinopril for his blood pressure at home until he is able to follow-up with his primary care doctor.  I sent his PCP a private message through epic discussing his presentation, the changes that we discussed, and asking her help to expedite patient's follow-up.  I did discuss my standard return precautions with patient.  He is now stable for discharge home.      _____________________________________________ Please note:  Patient was evaluated in Emergency Department today for the symptoms described in the history of present illness. Patient was evaluated in the context of the global COVID-19 pandemic, which necessitated consideration that the patient might be at risk for infection with the SARS-CoV-2 virus that causes COVID-19. Institutional protocols and algorithms that pertain to the evaluation of patients at risk for COVID-19 are in a state of rapid change based on information released by regulatory bodies including the CDC and federal and state organizations. These policies and algorithms were followed during the patient's care in the ED.  Some ED evaluations and interventions may be delayed as a result of limited staffing during the pandemic.   [place] Controlled Substance Database was reviewed by me. ____________________________________________   FINAL CLINICAL IMPRESSION(S) / ED DIAGNOSES   Final diagnoses:  Near syncope      NEW MEDICATIONS STARTED DURING THIS VISIT:  ED Discharge Orders     None        Note:  This document was prepared using Dragon voice  recognition software and may include unintentional dictation errors.    Alfred Levins, [place], MD 02/22/21 [phone]

## 2021-02-22 NOTE — Discharge Instructions (Addendum)
Stop hydrochlorothiazide, pioglitazone, and glipizide.  Continue on metformin 1000 mg twice a day.  Continue lisinopril.  Contact your doctor first thing in the morning for close follow-up.  Return to the emergency room for dizziness, chest pain, if you feel like you are going to pass out, headache.

## 2021-02-22 NOTE — ED Triage Notes (Signed)
Pt in with co dizziness since 0000 tonight states worse when he sits up. States same symptoms 2 weeks ago and was seen here. Was told it was due to low bp, low cbg and was discharged home to follow up with pmd.

## 2021-02-22 NOTE — Telephone Encounter (Signed)
Chat message sent from ED Dr. Michele Mcalpine morning Dr. Diona Browner. I had the pleasure of seeing your patient Jamie Finley in the emergency room this evening. He came in with near syncope. Was found to have low blood pressure with systolics in the low 600K and also sugar in the 60s. He reports that he has changed his diet to a low carb diet for the last month. He has lost close to 20 pounds. I was reviewing his medications and he is on metformin, pioglitazone, and glipizide for his diabetes. He is also on lisinopril and hydrochlorothiazide. After receiving fluids and some glucose through the IV patient felt improved. I told him to hold the pioglitazone, glipizide, and hydrochlorothiazide and continue the lisinopril and metformin. I would appreciate if you could help get him into your clinic as soon as possible for close follow-up. I also asked him to contact her office later today todiscuss these changes and make sure that you are in agreement.    Please call pt to make follow up appt on Friday ... have him follow blood suagars and blood pressure on new regimen and bring.

## 2021-02-22 NOTE — ED Notes (Signed)
ED Provider at bedside. 

## 2021-02-24 LAB — HM DIABETES EYE EXAM

## 2021-02-25 ENCOUNTER — Other Ambulatory Visit: Payer: Self-pay

## 2021-02-25 ENCOUNTER — Encounter: Payer: Self-pay | Admitting: Family Medicine

## 2021-02-25 ENCOUNTER — Ambulatory Visit (INDEPENDENT_AMBULATORY_CARE_PROVIDER_SITE_OTHER): Payer: Medicare Other | Admitting: Family Medicine

## 2021-02-25 VITALS — BP 90/60 | HR 71 | Temp 97.9°F | Ht 72.0 in | Wt 334.5 lb

## 2021-02-25 DIAGNOSIS — E118 Type 2 diabetes mellitus with unspecified complications: Secondary | ICD-10-CM | POA: Diagnosis not present

## 2021-02-25 DIAGNOSIS — I152 Hypertension secondary to endocrine disorders: Secondary | ICD-10-CM

## 2021-02-25 DIAGNOSIS — E1165 Type 2 diabetes mellitus with hyperglycemia: Secondary | ICD-10-CM | POA: Diagnosis not present

## 2021-02-25 DIAGNOSIS — R55 Syncope and collapse: Secondary | ICD-10-CM | POA: Insufficient documentation

## 2021-02-25 DIAGNOSIS — IMO0002 Reserved for concepts with insufficient information to code with codable children: Secondary | ICD-10-CM

## 2021-02-25 DIAGNOSIS — E1159 Type 2 diabetes mellitus with other circulatory complications: Secondary | ICD-10-CM

## 2021-02-25 LAB — GLUCOSE, POCT (MANUAL RESULT ENTRY): POC Glucose: 114 mg/dl — AB (ref 70–99)

## 2021-02-25 NOTE — Progress Notes (Signed)
Patient ID: Jamie Finley, male    DOB: 13-Oct-1960, 60 y.o.   MRN: [id number]  This visit was conducted in person.  BP 90/60   Pulse 71   Temp 97.9 F (36.6 C) (Temporal)   Ht 6' (1.829 m)   Wt (!) 334 lb 8 oz (151.7 kg)   SpO2 97%   BMI 45.37 kg/m    CC:  Chief Complaint  Patient presents with   Follow-up    ER Visit-Near Syncope/Low Blood Sugar    Subjective:   HPI: Jamie Finley is a 60 y.o. male presenting on 02/25/2021 for Follow-up (ER Visit-Near Syncope/Low Blood Sugar)  Reviewed ER note in detail.  Seen on 02/22/2021 with near syncope from hypoglycemia, occurring at midnight.  EMS showed blood sugar at 67.  At ER BP 109/79, glucose 76 Unremarkable BMET, troponin. Stable mild anemia. Given IVF LR D5 bolus.  He has lost 20 lbs in last 2 months. Wt Readings from Last 3 Encounters:  02/25/21 (!) 334 lb 8 oz (151.7 kg)  02/22/21 (!) 333 lb (151 kg)  02/15/21 (!) 335 lb 4 oz (152.1 kg)    BP Readings from Last 3 Encounters:  02/25/21 90/60  02/22/21 115/63  02/15/21 90/60    Did not understand recommendations on med change. He has stopped the glipizide and continue metformin  but at half dose  ( 500 mg BID) Still taking Told per notes to hold HCTZ and continue lisinopril... he is still taking both.  Today he reports  has been feeling better. He is not having any dizziness. He is not  lightheaded  when going from sitting to stand.  No fever.    He has not eaten this morning and has not taken his morning doses of medication.  FBS today 114.     Relevant past medical, surgical, family and social history reviewed and updated as indicated. Interim medical history since our last visit reviewed. Allergies and medications reviewed and updated. Outpatient Medications Prior to Visit  Medication Sig Dispense Refill   acetaminophen (TYLENOL) 325 MG tablet Take 325 mg by mouth every 4 (four) hours as needed.      aspirin 81 MG tablet Take 81 mg by mouth  daily.     blood glucose meter kit and supplies Dispense based on patient and insurance preference. Use to check blood sugar two times a day. 1 each 0   Cholecalciferol (D3-1000 PO) Take 5 tablets by mouth daily.     Clotrimazole 1 % OINT Apply to affected area twice daily. 15 g 0   colchicine 0.6 MG tablet Take 1 tablet (0.6 mg total) by mouth daily as needed. 30 tablet 1   gabapentin (NEURONTIN) 400 MG capsule Take 1 capsule (400 mg total) by mouth daily as needed. 90 capsule 3   hydrochlorothiazide (HYDRODIURIL) 25 MG tablet Take 1 tablet (25 mg total) by mouth daily. 90 tablet 1   lisinopril (ZESTRIL) 10 MG tablet TAKE ONE-HALF TABLET BY MOUTH ONCE DAILY 45 tablet 1   metFORMIN (GLUCOPHAGE-XR) 500 MG 24 hr tablet TAKE 2 TABLETS BY MOUTH IN THE MORNING AND 2 TABLETS AT BEDTIME (Patient taking differently: Take 500 mg by mouth in the morning and at bedtime.) 360 tablet 1   methocarbamol (ROBAXIN) 750 MG tablet Take 1 tablet (750 mg total) by mouth daily as needed for muscle spasms. 30 tablet 1   Multiple Vitamins-Minerals (ZINC PO) Take 1 tablet by mouth daily.     pioglitazone (ACTOS) 30  MG tablet Take 1 tablet (30 mg total) by mouth daily. 30 tablet 11   polyethylene glycol (MIRALAX / GLYCOLAX) packet Take 17 g by mouth daily. 14 each 0   rosuvastatin (CRESTOR) 20 MG tablet TAKE 1 TABLET BY MOUTH AT BEDTIME 90 tablet 3   glipiZIDE (GLUCOTROL XL) 10 MG 24 hr tablet Take 10 mg by mouth every morning. (Patient not taking: Reported on 02/25/2021)     No facility-administered medications prior to visit.     Per HPI unless specifically indicated in ROS section below Review of Systems  Constitutional:  Negative for fatigue and fever.  HENT:  Negative for ear pain.   Eyes:  Negative for pain.  Respiratory:  Negative for cough and shortness of breath.   Cardiovascular:  Negative for chest pain, palpitations and leg swelling.  Gastrointestinal:  Negative for abdominal pain.  Genitourinary:   Negative for dysuria.  Musculoskeletal:  Negative for arthralgias.  Neurological:  Negative for syncope, light-headedness and headaches.  Psychiatric/Behavioral:  Negative for dysphoric mood.   Objective:  BP 90/60   Pulse 71   Temp 97.9 F (36.6 C) (Temporal)   Ht 6' (1.829 m)   Wt (!) 334 lb 8 oz (151.7 kg)   SpO2 97%   BMI 45.37 kg/m   Wt Readings from Last 3 Encounters:  02/25/21 (!) 334 lb 8 oz (151.7 kg)  02/22/21 (!) 333 lb (151 kg)  02/15/21 (!) 335 lb 4 oz (152.1 kg)      Physical Exam Constitutional:      Appearance: He is well-developed.  HENT:     Head: Normocephalic.     Right Ear: Hearing normal.     Left Ear: Hearing normal.     Nose: Nose normal.  Neck:     Thyroid: No thyroid mass or thyromegaly.     Vascular: No carotid bruit.     Trachea: Trachea normal.  Cardiovascular:     Rate and Rhythm: Normal rate and regular rhythm.     Pulses: Normal pulses.     Heart sounds: Heart sounds not distant. No murmur heard.   No friction rub. No gallop.     Comments: No peripheral edema Pulmonary:     Effort: Pulmonary effort is normal. No respiratory distress.     Breath sounds: Normal breath sounds.  Skin:    General: Skin is warm and dry.     Findings: No rash.  Psychiatric:        Speech: Speech normal.        Behavior: Behavior normal.        Thought Content: Thought content normal.      Results for orders placed or performed during the hospital encounter of 02/22/21  CBC  Result Value Ref Range   WBC 9.1 4.0 - 10.5 K/uL   RBC 4.48 4.22 - 5.81 MIL/uL   Hemoglobin 12.9 (L) 13.0 - 17.0 g/dL   HCT 39.1 39.0 - 52.0 %   MCV 87.3 80.0 - 100.0 fL   MCH 28.8 26.0 - 34.0 pg   MCHC 33.0 30.0 - 36.0 g/dL   RDW 14.1 11.5 - 15.5 %   Platelets 255 150 - 400 K/uL   nRBC 0.0 0.0 - 0.2 %  Comprehensive metabolic panel  Result Value Ref Range   Sodium 135 135 - 145 mmol/L   Potassium 4.2 3.5 - 5.1 mmol/L   Chloride 101 98 - 111 mmol/L   CO2 28 22 - 32 mmol/L  Glucose, Bld 75 70 - 99 mg/dL   BUN 24 (H) 6 - 20 mg/dL   Creatinine, Ser 1.29 (H) 0.61 - 1.24 mg/dL   Calcium 9.3 8.9 - 10.3 mg/dL   Total Protein 7.3 6.5 - 8.1 g/dL   Albumin 4.2 3.5 - 5.0 g/dL   AST 25 15 - 41 U/L   ALT 16 0 - 44 U/L   Alkaline Phosphatase 51 38 - 126 U/L   Total Bilirubin 1.0 0.3 - 1.2 mg/dL   GFR, Estimated >60 >60 mL/min   Anion gap 6 5 - 15  POC CBG, ED  Result Value Ref Range   Glucose-Capillary 76 70 - 99 mg/dL  CBG monitoring, ED  Result Value Ref Range   Glucose-Capillary 175 (H) 70 - 99 mg/dL  Troponin I (High Sensitivity)  Result Value Ref Range   Troponin I (High Sensitivity) 4 <18 ng/L  Troponin I (High Sensitivity)  Result Value Ref Range   Troponin I (High Sensitivity) 3 <18 ng/L    This visit occurred during the SARS-CoV-2 public health emergency.  Safety protocols were in place, including screening questions prior to the visit, additional usage of staff PPE, and extensive cleaning of exam room while observing appropriate contact time as indicated for disinfecting solutions.   COVID 19 screen:  No recent travel or known exposure to COVID19 The patient denies respiratory symptoms of COVID 19 at this time. The importance of social distancing was discussed today.   Assessment and Plan Problem List Items Addressed This Visit     Diabetes mellitus type 2, uncontrolled, with complications ([place]) (Chronic)     Continue holding glipizide.  Continue metformin at half dose and  regular dose of pioglitazone.  Keep working on low carb diet.   Get new glucometer and check blood sugar fasting daily.. call with results in next 2-4 weeks.        Hypertension associated with diabetes ([place]) (Chronic)    Hold HCTZ and use only as needed for peripheral swelling.  Continue lisinopril.         Pre-syncope - Primary     Likely multifactorial from low BP and hypoglycemia form recent weight loss. Work up otherwise negative, no S/S of  infeciton.             [name], MD

## 2021-02-25 NOTE — Assessment & Plan Note (Addendum)
Likely multifactorial from low BP and hypoglycemia form recent weight loss. Work up otherwise negative, no S/S of infeciton.

## 2021-02-25 NOTE — Addendum Note (Signed)
Addended by: Carter Kitten on: 02/25/2021 12:25 PM   Modules accepted: Orders

## 2021-02-25 NOTE — Assessment & Plan Note (Signed)
Continue holding glipizide.  Continue metformin at half dose and  regular dose of pioglitazone.  Keep working on low carb diet.   Get new glucometer and check blood sugar fasting daily.. call with results in next 2-4 weeks.

## 2021-02-25 NOTE — Assessment & Plan Note (Signed)
Hold HCTZ and use only as needed for peripheral swelling.  Continue lisinopril.

## 2021-02-25 NOTE — Patient Instructions (Signed)
Hold HCTZ and use only as needed for peripheral swelling.  Continue lisinopril.  Continue holding glipizide.  Continue metformin at half dose and  regular dose of pioglitazone.  Keep working on low carb diet.   Get new glucometer and check blood sugar fasting daily.. call with results in next 2-4 weeks.

## 2021-03-01 DIAGNOSIS — IMO0002 Reserved for concepts with insufficient information to code with codable children: Secondary | ICD-10-CM

## 2021-03-01 DIAGNOSIS — E1165 Type 2 diabetes mellitus with hyperglycemia: Secondary | ICD-10-CM

## 2021-03-01 MED ORDER — ACCU-CHEK GUIDE W/DEVICE KIT: PACK | 0 refills | Status: AC

## 2021-03-01 MED ORDER — ACCU-CHEK SOFTCLIX LANCETS MISC: 3 refills | Status: DC

## 2021-03-01 MED ORDER — ACCU-CHEK GUIDE VI STRP: ORAL_STRIP | 3 refills | Status: AC

## 2021-03-11 ENCOUNTER — Other Ambulatory Visit: Payer: Self-pay | Admitting: Family Medicine

## 2021-03-14 NOTE — Assessment & Plan Note (Signed)
Stable, chronic.  Continue current medication.   Stable control on lisinopril 10 mg daily and HCTZ 25 mg daily

## 2021-03-14 NOTE — Assessment & Plan Note (Signed)
Poor control despite  metformin max and actos  30 mg start.  ( Held glipizide he was on prior).. started glipizide back  2 weeks ago.  Send MyChart message with fasting blood sugars daly and occ 2 hours after meals. FBS goal < 120, 2 hours after meals < 180. Keep up the increased activity. Work on lower carb meals.  Call if interested nutritionist.

## 2021-04-25 ENCOUNTER — Telehealth: Payer: Self-pay | Admitting: Family Medicine

## 2021-04-25 DIAGNOSIS — E1165 Type 2 diabetes mellitus with hyperglycemia: Secondary | ICD-10-CM

## 2021-04-25 DIAGNOSIS — IMO0002 Reserved for concepts with insufficient information to code with codable children: Secondary | ICD-10-CM

## 2021-04-25 NOTE — Telephone Encounter (Signed)
-----   Message from Cloyd Stagers, RT sent at 04/11/2021 10:06 AM EDT ----- Regarding: Lab Orders for Tuesday 8.16.2022 Please place lab orders for Tuesday 8.16.2022, office visit for 3 mo DM on Tuesday 8.23.2022 Thank you, Dyke Maes RT(R)

## 2021-04-26 ENCOUNTER — Other Ambulatory Visit: Payer: Self-pay

## 2021-04-26 ENCOUNTER — Other Ambulatory Visit (INDEPENDENT_AMBULATORY_CARE_PROVIDER_SITE_OTHER): Payer: Medicare Other

## 2021-04-26 DIAGNOSIS — E1165 Type 2 diabetes mellitus with hyperglycemia: Secondary | ICD-10-CM

## 2021-04-26 DIAGNOSIS — E118 Type 2 diabetes mellitus with unspecified complications: Secondary | ICD-10-CM

## 2021-04-26 DIAGNOSIS — IMO0002 Reserved for concepts with insufficient information to code with codable children: Secondary | ICD-10-CM

## 2021-04-26 LAB — HEMOGLOBIN A1C: Hgb A1c MFr Bld: 7.4 % — ABNORMAL HIGH (ref 4.6–6.5)

## 2021-04-26 LAB — LIPID PANEL
Cholesterol: 139 mg/dL (ref 0–200)
HDL: 42.9 mg/dL (ref >39.00)
NonHDL: 96
Total CHOL/HDL Ratio: 3
Triglycerides: 202 mg/dL — ABNORMAL HIGH (ref 0.0–149.0)
VLDL: 40.4 mg/dL — ABNORMAL HIGH (ref 0.0–40.0)

## 2021-04-26 LAB — COMPREHENSIVE METABOLIC PANEL
ALT: 14 U/L (ref 0–53)
AST: 17 U/L (ref 0–37)
Albumin: 4.2 g/dL (ref 3.5–5.2)
Alkaline Phosphatase: 54 U/L (ref 39–117)
BUN: 17 mg/dL (ref 6–23)
CO2: 29 mEq/L (ref 19–32)
Calcium: 9.7 mg/dL (ref 8.4–10.5)
Chloride: 99 mEq/L (ref 96–112)
Creatinine, Ser: 1.06 mg/dL (ref 0.40–1.50)
GFR: 76.3 mL/min (ref >60.00)
Glucose, Bld: 117 mg/dL — ABNORMAL HIGH (ref 70–99)
Potassium: 3.7 mEq/L (ref 3.5–5.1)
Sodium: 136 mEq/L (ref 135–145)
Total Bilirubin: 0.8 mg/dL (ref 0.2–1.2)
Total Protein: 7.5 g/dL (ref 6.0–8.3)

## 2021-04-26 LAB — LDL CHOLESTEROL, DIRECT: Direct LDL: 73 mg/dL

## 2021-04-26 NOTE — Progress Notes (Signed)
No critical labs need to be addressed urgently. We will discuss labs in detail at upcoming office visit.   

## 2021-05-03 ENCOUNTER — Other Ambulatory Visit: Payer: Self-pay

## 2021-05-03 ENCOUNTER — Encounter: Payer: Self-pay | Admitting: Family Medicine

## 2021-05-03 ENCOUNTER — Ambulatory Visit (INDEPENDENT_AMBULATORY_CARE_PROVIDER_SITE_OTHER): Payer: Medicare Other | Admitting: Family Medicine

## 2021-05-03 VITALS — BP 112/70 | HR 90 | Temp 98.2°F | Ht 72.0 in | Wt 330.2 lb

## 2021-05-03 DIAGNOSIS — I152 Hypertension secondary to endocrine disorders: Secondary | ICD-10-CM

## 2021-05-03 DIAGNOSIS — F341 Dysthymic disorder: Secondary | ICD-10-CM | POA: Diagnosis not present

## 2021-05-03 DIAGNOSIS — E1159 Type 2 diabetes mellitus with other circulatory complications: Secondary | ICD-10-CM | POA: Diagnosis not present

## 2021-05-03 DIAGNOSIS — E1169 Type 2 diabetes mellitus with other specified complication: Secondary | ICD-10-CM | POA: Diagnosis not present

## 2021-05-03 DIAGNOSIS — E785 Hyperlipidemia, unspecified: Secondary | ICD-10-CM

## 2021-05-03 DIAGNOSIS — E1165 Type 2 diabetes mellitus with hyperglycemia: Secondary | ICD-10-CM

## 2021-05-03 DIAGNOSIS — IMO0002 Reserved for concepts with insufficient information to code with codable children: Secondary | ICD-10-CM

## 2021-05-03 DIAGNOSIS — K5909 Other constipation: Secondary | ICD-10-CM

## 2021-05-03 DIAGNOSIS — E118 Type 2 diabetes mellitus with unspecified complications: Secondary | ICD-10-CM

## 2021-05-03 NOTE — Progress Notes (Signed)
Patient ID: Jamie Finley, male    DOB: [birth year], 60 y.o.   MRN: [id number]  This visit was conducted in person.  BP 112/70   Pulse 90   Temp 98.2 F (36.8 C) (Temporal)   Ht 6' (1.829 m)   Wt (!) 330 lb 4 oz (149.8 kg)   SpO2 98%   BMI 44.79 kg/m    CC: Chief Complaint  Patient presents with   Diabetes    Subjective:   HPI: Jamie Belger is a 60 y.o. male presenting on 05/03/2021 for Diabetes  Diabetes: Improving DM control A1C down from 9.1!   On Actos 30 mg daily, metformin 500 ER BID, He has been back on the glipizide 210 mg   He has COVID in last 3 months.Marland Kitchen ate more carbs Lab Results  Component Value Date   HGBA1C 7.4 (H) 04/26/2021  Using medications without difficulties: Hypoglycemic episodes: Hyperglycemic episodes: Feet problems: none Blood Sugars averaging:  FBS 130-140. eye exam within last year:   Wt Readings from Last 3 Encounters:  05/03/21 (!) 330 lb 4 oz (149.8 kg)  02/25/21 (!) 334 lb 8 oz (151.7 kg)  02/22/21 (!) 333 lb (151 kg)   He continues to feel bloating given constipation.  .. using herbal medication.  Elevated Cholesterol: LDL almost at goal < 70 on crestor 20 mg daily Lab Results  Component Value Date   CHOL 139 04/26/2021   HDL 42.90 04/26/2021   LDLCALC 62 03/02/2020   LDLDIRECT 73.0 04/26/2021   TRIG 202.0 (H) 04/26/2021   CHOLHDL 3 04/26/2021  Using medications without problems: Muscle aches:  Diet compliance: Exercise: minimal Other complaints:  Hypertension:   At goal  on lisinopril  5 mg and HCTZ 25 daily BP Readings from Last 3 Encounters:  05/03/21 112/70  02/25/21 90/60  02/22/21 115/63  Using medication without problems or lightheadedness:  Chest pain with exertion: Edema: yes Short of breath: Average home BPs: Other issues:   Hyponatremia resolved. Renal function improved Cr 1.06, GFR 76     Relevant past medical, surgical, family and social history reviewed and updated as indicated. Interim  medical history since our last visit reviewed. Allergies and medications reviewed and updated. Outpatient Medications Prior to Visit  Medication Sig Dispense Refill   Accu-Chek Softclix Lancets lancets Use to check fasting blood sugar daily 100 each 3   acetaminophen (TYLENOL) 325 MG tablet Take 325 mg by mouth every 4 (four) hours as needed.      aspirin 81 MG tablet Take 81 mg by mouth daily.     blood glucose meter kit and supplies Dispense based on patient and insurance preference. Use to check blood sugar two times a day. 1 each 0   Blood Glucose Monitoring Suppl (ACCU-CHEK GUIDE) w/Device KIT Use to check blood fasting blood sugar daily 1 kit 0   Cholecalciferol (VITAMIN D3) 50 MCG (2000 UT) TABS Take 5-7 tablets by mouth daily.     Clotrimazole 1 % OINT Apply to affected area twice daily. 15 g 0   colchicine 0.6 MG tablet Take 1 tablet (0.6 mg total) by mouth daily as needed. 30 tablet 1   gabapentin (NEURONTIN) 400 MG capsule Take 1 capsule (400 mg total) by mouth daily as needed. 90 capsule 3   glucose blood (ACCU-CHEK GUIDE) test strip Use to check blood fasting blood sugar daily 100 each 3   hydrochlorothiazide (HYDRODIURIL) 25 MG tablet Take 25 mg by mouth daily as needed.  lisinopril (ZESTRIL) 10 MG tablet TAKE ONE-HALF TABLET BY MOUTH ONCE DAILY 45 tablet 1   metFORMIN (GLUCOPHAGE-XR) 500 MG 24 hr tablet Take 500 mg by mouth in the morning and at bedtime.     methocarbamol (ROBAXIN) 750 MG tablet Take 1 tablet (750 mg total) by mouth daily as needed for muscle spasms. 30 tablet 1   Multiple Vitamins-Minerals (ZINC PO) Take 1 tablet by mouth daily.     pioglitazone (ACTOS) 30 MG tablet Take 1 tablet (30 mg total) by mouth daily. 30 tablet 11   polyethylene glycol (MIRALAX / GLYCOLAX) packet Take 17 g by mouth daily. 14 each 0   rosuvastatin (CRESTOR) 20 MG tablet TAKE 1 TABLET BY MOUTH AT BEDTIME 90 tablet 3   Cholecalciferol (D3-1000 PO) Take 5 tablets by mouth daily.     No  facility-administered medications prior to visit.     Per HPI unless specifically indicated in ROS section below Review of Systems Objective:  BP 112/70   Pulse 90   Temp 98.2 F (36.8 C) (Temporal)   Ht 6' (1.829 m)   Wt (!) 330 lb 4 oz (149.8 kg)   SpO2 98%   BMI 44.79 kg/m   Wt Readings from Last 3 Encounters:  05/03/21 (!) 330 lb 4 oz (149.8 kg)  02/25/21 (!) 334 lb 8 oz (151.7 kg)  02/22/21 (!) 333 lb (151 kg)      Physical Exam Constitutional:      Appearance: He is well-developed. He is obese.     Comments: Wheelchair bound  HENT:     Head: Normocephalic.     Right Ear: Hearing normal.     Left Ear: Hearing normal.     Nose: Nose normal.  Neck:     Thyroid: No thyroid mass or thyromegaly.     Vascular: No carotid bruit.     Trachea: Trachea normal.  Cardiovascular:     Rate and Rhythm: Normal rate and regular rhythm.     Pulses: Normal pulses.     Heart sounds: Heart sounds not distant. No murmur heard.   No friction rub. No gallop.     Comments: No peripheral edema Pulmonary:     Effort: Pulmonary effort is normal. No respiratory distress.     Breath sounds: Normal breath sounds.  Skin:    General: Skin is warm and dry.     Findings: No rash.  Psychiatric:        Speech: Speech normal.        Behavior: Behavior normal.        Thought Content: Thought content normal.      Results for orders placed or performed in visit on 04/26/21  Comprehensive metabolic panel  Result Value Ref Range   Sodium 136 135 - 145 mEq/L   Potassium 3.7 3.5 - 5.1 mEq/L   Chloride 99 96 - 112 mEq/L   CO2 29 19 - 32 mEq/L   Glucose, Bld 117 (H) 70 - 99 mg/dL   BUN 17 6 - 23 mg/dL   Creatinine, Ser 1.06 0.40 - 1.50 mg/dL   Total Bilirubin 0.8 0.2 - 1.2 mg/dL   Alkaline Phosphatase 54 39 - 117 U/L   AST 17 0 - 37 U/L   ALT 14 0 - 53 U/L   Total Protein 7.5 6.0 - 8.3 g/dL   Albumin 4.2 3.5 - 5.2 g/dL   GFR 76.30 >60.00 mL/min   Calcium 9.7 8.4 - 10.5 mg/dL  Lipid panel  Result Value Ref Range   Cholesterol 139 0 - 200 mg/dL   Triglycerides 202.0 (H) 0.0 - 149.0 mg/dL   HDL 42.90 >39.00 mg/dL   VLDL 40.4 (H) 0.0 - 40.0 mg/dL   Total CHOL/HDL Ratio 3    NonHDL 96.00   Hemoglobin A1c  Result Value Ref Range   Hgb A1c MFr Bld 7.4 (H) 4.6 - 6.5 %  LDL cholesterol, direct  Result Value Ref Range   Direct LDL 73.0 mg/dL    This visit occurred during the SARS-CoV-2 public health emergency.  Safety protocols were in place, including screening questions prior to the visit, additional usage of staff PPE, and extensive cleaning of exam room while observing appropriate contact time as indicated for disinfecting solutions.   COVID 19 screen:  No recent travel or known exposure to COVID19 The patient denies respiratory symptoms of COVID 19 at this time. The importance of social distancing was discussed today.   Assessment and Plan    Problem List Items Addressed This Visit     Diabetes mellitus type 2, uncontrolled, with complications ([place]) - Primary (Chronic)    Improved control, but he is not sure why! Moderate diet, did eat less when had COVID.  Continue glipizide 1 mg XL  Metformin 500 mg 2 tabs daily  Actos 30 mg daily   Discussed benefits of GLP1.. he will consider.      Hyperlipidemia associated with type 2 diabetes mellitus (HCC) (Chronic)    Stable, chronic.  Continue current medication.    crestor 20 mg daily      Hypertension associated with diabetes (HCC) (Chronic)    Stable, chronic.  Continue current medication.    Well controlled on 1/2 tab of lisinopril daily.. need ACEI for kidney protection.      Chronic constipation    Add back daily miralax if not improving      Dysthymia    Not really overt MDD... not interested in medication at this time.  Refer to counseling.      Relevant Orders   Ambulatory referral to Psychology   Orders Placed This Encounter  Procedures   Ambulatory referral to Psychology    Referral  Priority:   Routine    Referral Type:   Psychiatric    Referral Reason:   Specialty Services Required    Requested Specialty:   Psychology    Number of Visits Requested:   1      [name], MD

## 2021-05-03 NOTE — Assessment & Plan Note (Signed)
Stable, chronic.  Continue current medication.    Well controlled on 1/2 tab of lisinopril daily.. need ACEI for kidney protection.

## 2021-05-03 NOTE — Assessment & Plan Note (Signed)
Not really overt MDD... not interested in medication at this time.  Refer to counseling.

## 2021-05-03 NOTE — Assessment & Plan Note (Signed)
Add back daily miralax if not improving

## 2021-05-03 NOTE — Assessment & Plan Note (Signed)
Stable, chronic.  Continue current medication.   crestor 20 mg daily 

## 2021-05-03 NOTE — Patient Instructions (Addendum)
If herbal medicine isn't helping.. can use Miralax 17 gm daily or BID. Continue back on the glipizide 10 mg XL daily.  Follow blood sugars at home FBS goal < 120.  Keep trying to work on low carb diet.  Call about counseling referral.   You can look into coverage of Trulicity or Ozempic for weihgt loss and diabetes control.

## 2021-05-03 NOTE — Assessment & Plan Note (Signed)
Improved control, but he is not sure why! Moderate diet, did eat less when had COVID.  Continue glipizide 1 mg XL  Metformin 500 mg 2 tabs daily  Actos 30 mg daily   Discussed benefits of GLP1.. he will consider.

## 2021-05-04 DIAGNOSIS — E871 Hypo-osmolality and hyponatremia: Secondary | ICD-10-CM | POA: Insufficient documentation

## 2021-05-04 DIAGNOSIS — W19XXXA Unspecified fall, initial encounter: Secondary | ICD-10-CM | POA: Insufficient documentation

## 2021-05-04 NOTE — Assessment & Plan Note (Signed)
Likely due to HCTZ... re-evla with labs.

## 2021-05-04 NOTE — Assessment & Plan Note (Signed)
Reviewed home safety.

## 2021-05-04 NOTE — Assessment & Plan Note (Addendum)
Acute, worsening   Spike, now resolved. Now low normal.  Work on low salt diet. Follow BP on arm cuff daily... call if < 90/60 or > 140/90.   On lisinopril 5 mg daily, HCTZ 25 mg daily

## 2021-05-04 NOTE — Assessment & Plan Note (Signed)
Due to bruising from fall likely. Work on gentle stretching, start heat and can use tylneol prn.  Call if low back pain worsening.

## 2021-05-19 ENCOUNTER — Other Ambulatory Visit: Payer: Self-pay | Admitting: Family Medicine

## 2021-05-21 ENCOUNTER — Other Ambulatory Visit: Payer: Self-pay | Admitting: Family Medicine

## 2021-06-04 ENCOUNTER — Emergency Department: Payer: Medicare Other

## 2021-06-04 ENCOUNTER — Other Ambulatory Visit: Payer: Self-pay

## 2021-06-04 ENCOUNTER — Emergency Department
Admission: EM | Admit: 2021-06-04 | Discharge: 2021-06-04 | Disposition: A | Discharge status: Home / Self Care | Payer: Medicare Other | Attending: Emergency Medicine | Admitting: Emergency Medicine

## 2021-06-04 DIAGNOSIS — Z7982 Long term (current) use of aspirin: Secondary | ICD-10-CM | POA: Diagnosis not present

## 2021-06-04 DIAGNOSIS — R070 Pain in throat: Secondary | ICD-10-CM | POA: Diagnosis present

## 2021-06-04 DIAGNOSIS — Z7984 Long term (current) use of oral hypoglycemic drugs: Secondary | ICD-10-CM | POA: Diagnosis not present

## 2021-06-04 DIAGNOSIS — Z87891 Personal history of nicotine dependence: Secondary | ICD-10-CM | POA: Insufficient documentation

## 2021-06-04 DIAGNOSIS — Z79899 Other long term (current) drug therapy: Secondary | ICD-10-CM | POA: Insufficient documentation

## 2021-06-04 DIAGNOSIS — K1379 Other lesions of oral mucosa: Secondary | ICD-10-CM

## 2021-06-04 DIAGNOSIS — R0989 Other specified symptoms and signs involving the circulatory and respiratory systems: Secondary | ICD-10-CM | POA: Insufficient documentation

## 2021-06-04 DIAGNOSIS — I10 Essential (primary) hypertension: Secondary | ICD-10-CM | POA: Insufficient documentation

## 2021-06-04 DIAGNOSIS — E1169 Type 2 diabetes mellitus with other specified complication: Secondary | ICD-10-CM | POA: Diagnosis not present

## 2021-06-04 LAB — CBC
HCT: 39.1 % (ref 39.0–52.0)
Hemoglobin: 13.3 g/dL (ref 13.0–17.0)
MCH: 30 pg (ref 26.0–34.0)
MCHC: 34 g/dL (ref 30.0–36.0)
MCV: 88.1 fL (ref 80.0–100.0)
Platelets: 245 10*3/uL (ref 150–400)
RBC: 4.44 MIL/uL (ref 4.22–5.81)
RDW: 14.3 % (ref 11.5–15.5)
WBC: 8 10*3/uL (ref 4.0–10.5)
nRBC: 0 % (ref 0.0–0.2)

## 2021-06-04 LAB — GROUP A STREP BY PCR: Group A Strep by PCR: NOT DETECTED

## 2021-06-04 LAB — BASIC METABOLIC PANEL
Anion gap: 11 (ref 5–15)
BUN: 21 mg/dL — ABNORMAL HIGH (ref 6–20)
CO2: 30 mmol/L (ref 22–32)
Calcium: 9.3 mg/dL (ref 8.9–10.3)
Chloride: 97 mmol/L — ABNORMAL LOW (ref 98–111)
Creatinine, Ser: 1.02 mg/dL (ref 0.61–1.24)
GFR, Estimated: >60 mL/min (ref >60)
Glucose, Bld: 110 mg/dL — ABNORMAL HIGH (ref 70–99)
Potassium: 3.7 mmol/L (ref 3.5–5.1)
Sodium: 138 mmol/L (ref 135–145)

## 2021-06-04 MED ORDER — LIDOCAINE VISCOUS HCL 2 % MT SOLN
15.0000 mL | Freq: Once | OROMUCOSAL | Status: AC
  Administered 2021-06-04: 15 mL via ORAL
  Filled 2021-06-04: qty 15

## 2021-06-04 MED ORDER — IOHEXOL 350 MG/ML SOLN
75.0000 mL | Freq: Once | INTRAVENOUS | Status: AC | PRN
  Administered 2021-06-04: 75 mL via INTRAVENOUS

## 2021-06-04 MED ORDER — ALUM & MAG HYDROXIDE-SIMETH 200-200-20 MG/5ML PO SUSP
30.0000 mL | Freq: Once | ORAL | Status: AC
  Administered 2021-06-04: 30 mL via ORAL
  Filled 2021-06-04: qty 30

## 2021-06-04 NOTE — ED Provider Notes (Signed)
[facility] Emergency Department Provider Note  ____________________________________________  Time seen: Approximately 6:19 AM  I have reviewed the triage vital signs and the nursing notes.   HISTORY  Chief Complaint sensation of airway closing   HPI Jamie Finley is a 60 y.o. male with a history of morbid obesity, diabetes, hypertension, hyperlipidemia, CVA who presents for evaluation of throat discomfort.  Patient reports that he woke up this morning with the sensation that there was something blocking his throat. He was having some GERD before this happened and reports having the burning and acid feeling in his mouth. He reports that he does not feel like his throat is closing or that is hurts. It feels like his mouth is very dry and it is difficult to swallow. No respiratory distress, no fever, no sore throat, no congestion, no cough, no chest pain, no shortness of breath.  Patient denies any prior history of anaphylaxis, no new foods or medications.  Past Medical History:  Diagnosis Date   Arthritis    Depression    Diabetes mellitus without complication ([place])    Frequent headaches    Gout    Hyperlipidemia    Hypertension    Stroke [facility])     Patient Active Problem List   Diagnosis Date Noted   Hyponatremia 05/04/2021   Accidental fall 05/04/2021   Dysthymia 05/03/2021   Pre-syncope 02/25/2021   Decreased mobility of joint 01/28/2021   Peripheral edema 01/05/2021   Erectile dysfunction 07/23/2020   BMI 40.0-44.9, adult ([place]) 07/23/2020   Cardiomegaly 02/25/2019   History of DVT (deep vein thrombosis) 12/24/2018   Cauda equina syndrome with neurogenic bladder ([place]) 12/10/2018   Chronic constipation 12/10/2018   Gout of right foot 09/25/2017   Bilateral knee pain 08/24/2017   MDD (recurrent major depressive disorder) in remission ([place]) 08/24/2017   Acute midline low back pain without sciatica 02/27/2017   History of CVA (cerebrovascular  accident) 01/15/2016   Hyperlipidemia associated with type 2 diabetes mellitus ([place]) 02/18/2015   Peripheral autonomic neuropathy due to diabetes mellitus ([place]) 02/18/2015   Diabetes mellitus type 2, uncontrolled, with complications ([place]) 09/31/1216   Hypertension associated with diabetes ([place]) 12/15/2014   Morbid obesity ([place]) 12/15/2014    Past Surgical History:  Procedure Laterality Date   BACK SURGERY  2007   [facility]   COLONOSCOPY WITH PROPOFOL N/A 01/27/2020   Procedure: COLONOSCOPY WITH PROPOFOL;  Surgeon: Jonathon Bellows, MD;  Location: [facility] ENDOSCOPY;  Service: Gastroenterology;  Laterality: N/A;   COLONOSCOPY WITH PROPOFOL N/A 01/28/2020   Procedure: COLONOSCOPY WITH PROPOFOL;  Surgeon: Jonathon Bellows, MD;  Location: [facility] ENDOSCOPY;  Service: Gastroenterology;  Laterality: N/A;   TONSILLECTOMY      Prior to Admission medications   Medication Sig Start Date End Date Taking? Authorizing Provider  Accu-Chek Softclix Lancets lancets Use to check fasting blood sugar daily 03/01/21   Bedsole, Amy E, MD  acetaminophen (TYLENOL) 325 MG tablet Take 325 mg by mouth every 4 (four) hours as needed.     [provider]  aspirin 81 MG tablet Take 81 mg by mouth daily.    Roselee Nova, MD  blood glucose meter kit and supplies Dispense based on patient and insurance preference. Use to check blood sugar two times a day. 12/12/18   Bedsole, Amy E, MD  Blood Glucose Monitoring Suppl (ACCU-CHEK GUIDE) w/Device KIT Use to check blood fasting blood sugar daily 03/01/21   [name], MD  Cholecalciferol (VITAMIN D3)  50 MCG (2000 UT) TABS Take 5-7 tablets by mouth daily.    [provider]  Clotrimazole 1 % OINT Apply to affected area twice daily. 07/23/20   Bedsole, Amy E, MD  colchicine 0.6 MG tablet Take 1 tablet (0.6 mg total) by mouth daily as needed. 09/10/19   Bedsole, Amy E, MD  gabapentin (NEURONTIN) 400 MG capsule Take 1 capsule (400 mg total) by mouth daily as needed. 07/23/20    Bedsole, Amy E, MD  glipiZIDE (GLUCOTROL XL) 10 MG 24 hr tablet Take 10 mg by mouth daily with breakfast.    [provider]  glucose blood (ACCU-CHEK GUIDE) test strip Use to check blood fasting blood sugar daily 03/01/21   Bedsole, Amy E, MD  hydrochlorothiazide (HYDRODIURIL) 25 MG tablet Take 1 tablet (25 mg total) by mouth daily as needed. 05/23/21   Bedsole, Amy E, MD  lisinopril (ZESTRIL) 10 MG tablet TAKE ONE-HALF TABLET BY MOUTH ONCE DAILY 03/11/21   Bedsole, Amy E, MD  metFORMIN (GLUCOPHAGE-XR) 500 MG 24 hr tablet Take 1 tablet (500 mg total) by mouth 2 (two) times daily. 05/19/21   Bedsole, Amy E, MD  methocarbamol (ROBAXIN) 750 MG tablet Take 1 tablet (750 mg total) by mouth daily as needed for muscle spasms. 09/10/19   Bedsole, Amy E, MD  Multiple Vitamins-Minerals (ZINC PO) Take 1 tablet by mouth daily.    [provider]  pioglitazone (ACTOS) 30 MG tablet Take 1 tablet (30 mg total) by mouth daily. 10/29/20   Bedsole, Amy E, MD  polyethylene glycol (MIRALAX / GLYCOLAX) packet Take 17 g by mouth daily. 06/22/18   Bettey Costa, MD  rosuvastatin (CRESTOR) 20 MG tablet TAKE 1 TABLET BY MOUTH AT BEDTIME 01/27/21   Bedsole, Amy E, MD    Allergies Patient has no known allergies.  Family History  Problem Relation Age of Onset   Cancer Mother        face and jaw   Cancer Father        kidney   Early death Father    Diabetes Maternal Grandmother    Depression Maternal Grandfather    Diabetes Maternal Grandfather    Hearing loss Maternal Grandfather     Social History Social History   Tobacco Use   Smoking status: Former    Types: Cigarettes    Quit date: 09/11/1989    Years since quitting: 31.7   Smokeless tobacco: Never  Vaping Use   Vaping Use: Never used  Substance Use Topics   Alcohol use: Yes    Comment: rarely    Drug use: No    Review of Systems  Constitutional: Negative for fever. Eyes: Negative for visual changes. ENT: + dry mouth and throat  discomfort Neck: No neck pain  Cardiovascular: Negative for chest pain. Respiratory: Negative for shortness of breath. Gastrointestinal: Negative for abdominal pain, vomiting or diarrhea. Genitourinary: Negative for dysuria. Musculoskeletal: Negative for back pain. Skin: Negative for rash. Neurological: Negative for headaches, weakness or numbness. Psych: No SI or HI  ____________________________________________   PHYSICAL EXAM:  VITAL SIGNS: ED Triage Vitals  Enc Vitals Group     BP 06/04/21 0347 (!) 142/81     Pulse Rate 06/04/21 0347 83     Resp 06/04/21 0347 20     Temp --      Temp Source 06/04/21 0347 Oral     SpO2 06/04/21 0347 98 %     Weight 06/04/21 0348 (!) 325 lb (147.4 kg)  Height 06/04/21 0348 6' (1.829 m)     Head Circumference --      Peak Flow --      Pain Score 06/04/21 0349 0     Pain Loc --      Pain Edu? --      Excl. in [place]? --     Constitutional: Alert and oriented. Well appearing and in no apparent distress. Drinking water with no difficulty HEENT:      Head: Normocephalic and atraumatic.         Eyes: Conjunctivae are normal. Sclera is non-icteric.       Mouth/Throat: Mucous membranes are moist. Uvula is swollen but no erythema, or exudate, no peritonsillar abscess, tonsils look normal sized with no swelling or erythema      Neck: Supple with no signs of meningismus. Cardiovascular: Regular rate and rhythm. No murmurs, gallops, or rubs. 2+ symmetrical distal pulses are present in all extremities. No JVD. Respiratory: Normal respiratory effort. Lungs are clear to auscultation bilaterally. No stridor Gastrointestinal: Soft, non tender, and non distended with positive bowel sounds. No rebound or guarding. Genitourinary: No CVA tenderness. Musculoskeletal:  No edema, cyanosis, or erythema of extremities. Neurologic: Normal speech and language. Face is symmetric. Moving all extremities. No gross focal neurologic deficits are appreciated. Skin: Skin  is warm, dry and intact. No rash noted. Psychiatric: Mood and affect are normal. Speech and behavior are normal.  ____________________________________________   LABS (all labs ordered are listed, but only abnormal results are displayed)  Labs Reviewed  BASIC METABOLIC PANEL - Abnormal; Notable for the following components:      Result Value   Chloride 97 (*)    Glucose, Bld 110 (*)    BUN 21 (*)    All other components within normal limits  GROUP A STREP BY PCR  CBC   ____________________________________________  EKG  none  ____________________________________________  RADIOLOGY  CT neck: PND ____________________________________________   PROCEDURES  Procedure(s) performed: None Procedures   Critical Care performed:  None ____________________________________________   INITIAL IMPRESSION / ASSESSMENT AND PLAN / ED COURSE  60 y.o. male with a history of morbid obesity, diabetes, hypertension, hyperlipidemia, CVA who presents for evaluation of throat discomfort.  Patient cannot quite explain what he is feeling.  He reports that he has some discomfort in the throat, some difficulty swallowing.  May be had some GERD/acid sensation before that started.  Initially thought it was just reflux so patient received Maalox with viscous lidocaine.  He denies feeling any better after that.  On exam his uvula looks slightly swollen which I believe is most likely caused by patient's obesity and probable snoring.  Does not seem to be inflamed or infected.  The tonsils look normal, he has no stridor, there is no signs of anaphylaxis.  There is no signs of peritonsillar abscess.  Since patient is now feeling better we will pursue a CT of the neck with contrast to rule out a retropharyngeal abscess or smaller peritonsillar abscess or any other possible obstructions.  _________________________ 7:10 AM on 06/04/2021 ----------------------------------------- CT pending. Care transferred to  Dr. Cheri Fowler      _____________________________________________ Please note:  Patient was evaluated in Emergency Department today for the symptoms described in the history of present illness. Patient was evaluated in the context of the global COVID-19 pandemic, which necessitated consideration that the patient might be at risk for infection with the SARS-CoV-2 virus that causes COVID-19. Institutional protocols and algorithms that pertain to the evaluation  of patients at risk for COVID-19 are in a state of rapid change based on information released by regulatory bodies including the CDC and federal and state organizations. These policies and algorithms were followed during the patient's care in the ED.  Some ED evaluations and interventions may be delayed as a result of limited staffing during the pandemic.   [place] Controlled Substance Database was reviewed by me. ____________________________________________   FINAL CLINICAL IMPRESSION(S) / ED DIAGNOSES  Throat Discomfort    NEW MEDICATIONS STARTED DURING THIS VISIT:  ED Discharge Orders     None        Note:  This document was prepared using Dragon voice recognition software and may include unintentional dictation errors.    Rudene Re, MD 06/04/21 [phone]

## 2021-06-04 NOTE — ED Notes (Signed)
Cannot get an acurate temp in triage due to pt drinking water in triage.

## 2021-06-04 NOTE — ED Notes (Signed)
Pt managing secretions well. No resp distress noted. Able to take medication sans complications. Speaking in full sentences.    Denies needs at this time.

## 2021-06-04 NOTE — Discharge Instructions (Addendum)
Please start using your CPAP machine every night

## 2021-06-04 NOTE — ED Provider Notes (Signed)
Emergency department handoff note  Care of this patient was signed out to me at the end of the previous provider shift.  All pertinent patient information was conveyed and all questions were answered.  Patient pending CT of the neck soft tissue that only showed mild uvular swelling without any evidence of abscess or airway compromise.  The patient has been reexamined and is ready to be discharged.  All diagnostic results have been reviewed and discussed with the patient/family.  Care plan has been outlined and the patient/family understands all current diagnoses, results, and treatment plans.  There are no new complaints, changes, or physical findings at this time.  All questions have been addressed and answered.  Patient recommended to use a CPAP machine that he already has.  Patient was instructed to, and agrees to follow-up with their primary care physician as well as return to the emergency department if any new or worsening symptoms develop.   Naaman Plummer, MD 06/04/21 [phone]

## 2021-06-04 NOTE — ED Triage Notes (Signed)
Pt states he woke up feeling like his "airway is closing off". Pt is sipping on water in triage and able to speak in full sentences. Pt without hives, but complains of "acid reflux beginning" and anxiety.

## 2021-06-21 ENCOUNTER — Ambulatory Visit (INDEPENDENT_AMBULATORY_CARE_PROVIDER_SITE_OTHER): Payer: Medicare Other | Admitting: Psychology

## 2021-06-21 ENCOUNTER — Other Ambulatory Visit: Payer: Self-pay

## 2021-06-21 DIAGNOSIS — F4323 Adjustment disorder with mixed anxiety and depressed mood: Secondary | ICD-10-CM

## 2021-07-13 ENCOUNTER — Other Ambulatory Visit: Payer: Self-pay

## 2021-07-13 ENCOUNTER — Emergency Department
Admission: EM | Admit: 2021-07-13 | Discharge: 2021-07-13 | Disposition: A | Discharge status: Home / Self Care | Payer: Medicare Other | Attending: Emergency Medicine | Admitting: Emergency Medicine

## 2021-07-13 ENCOUNTER — Encounter: Payer: Self-pay | Admitting: Emergency Medicine

## 2021-07-13 DIAGNOSIS — Z79899 Other long term (current) drug therapy: Secondary | ICD-10-CM | POA: Insufficient documentation

## 2021-07-13 DIAGNOSIS — Z87891 Personal history of nicotine dependence: Secondary | ICD-10-CM | POA: Insufficient documentation

## 2021-07-13 DIAGNOSIS — Z7982 Long term (current) use of aspirin: Secondary | ICD-10-CM | POA: Insufficient documentation

## 2021-07-13 DIAGNOSIS — Z7984 Long term (current) use of oral hypoglycemic drugs: Secondary | ICD-10-CM | POA: Insufficient documentation

## 2021-07-13 DIAGNOSIS — M542 Cervicalgia: Secondary | ICD-10-CM

## 2021-07-13 DIAGNOSIS — M25512 Pain in left shoulder: Secondary | ICD-10-CM | POA: Diagnosis not present

## 2021-07-13 DIAGNOSIS — M5412 Radiculopathy, cervical region: Secondary | ICD-10-CM | POA: Diagnosis not present

## 2021-07-13 DIAGNOSIS — E119 Type 2 diabetes mellitus without complications: Secondary | ICD-10-CM | POA: Insufficient documentation

## 2021-07-13 DIAGNOSIS — I10 Essential (primary) hypertension: Secondary | ICD-10-CM | POA: Diagnosis not present

## 2021-07-13 LAB — CBC WITH DIFFERENTIAL/PLATELET
Abs Immature Granulocytes: 0.03 K/uL (ref 0.00–0.07)
Basophils Absolute: 0.1 K/uL (ref 0.0–0.1)
Basophils Relative: 1 %
Eosinophils Absolute: 0.2 K/uL (ref 0.0–0.5)
Eosinophils Relative: 2 %
HCT: 38.4 % — ABNORMAL LOW (ref 39.0–52.0)
Hemoglobin: 13.2 g/dL (ref 13.0–17.0)
Immature Granulocytes: 0 %
Lymphocytes Relative: 34 %
Lymphs Abs: 3.4 K/uL (ref 0.7–4.0)
MCH: 29.9 pg (ref 26.0–34.0)
MCHC: 34.4 g/dL (ref 30.0–36.0)
MCV: 86.9 fL (ref 80.0–100.0)
Monocytes Absolute: 0.9 K/uL (ref 0.1–1.0)
Monocytes Relative: 9 %
Neutro Abs: 5.6 K/uL (ref 1.7–7.7)
Neutrophils Relative %: 54 %
Platelets: 277 K/uL (ref 150–400)
RBC: 4.42 MIL/uL (ref 4.22–5.81)
RDW: 14.5 % (ref 11.5–15.5)
WBC: 10.1 K/uL (ref 4.0–10.5)
nRBC: 0 % (ref 0.0–0.2)

## 2021-07-13 LAB — COMPREHENSIVE METABOLIC PANEL
ALT: 15 U/L (ref 0–44)
AST: 20 U/L (ref 15–41)
Albumin: 4.2 g/dL (ref 3.5–5.0)
Alkaline Phosphatase: 62 U/L (ref 38–126)
Anion gap: 9 (ref 5–15)
BUN: 21 mg/dL — ABNORMAL HIGH (ref 6–20)
CO2: 26 mmol/L (ref 22–32)
Calcium: 9.2 mg/dL (ref 8.9–10.3)
Chloride: 101 mmol/L (ref 98–111)
Creatinine, Ser: 1.11 mg/dL (ref 0.61–1.24)
GFR, Estimated: >60 mL/min (ref >60)
Glucose, Bld: 92 mg/dL (ref 70–99)
Potassium: 3.8 mmol/L (ref 3.5–5.1)
Sodium: 136 mmol/L (ref 135–145)
Total Bilirubin: 1.1 mg/dL (ref 0.3–1.2)
Total Protein: 8 g/dL (ref 6.5–8.1)

## 2021-07-13 LAB — TROPONIN I (HIGH SENSITIVITY): Troponin I (High Sensitivity): 4 ng/L

## 2021-07-13 MED ORDER — CYCLOBENZAPRINE HCL 10 MG PO TABS: 10.0000 mg | ORAL_TABLET | Freq: Three times a day (TID) | ORAL | 0 refills | Status: DC | PRN

## 2021-07-13 NOTE — ED Provider Notes (Signed)
[facility] Emergency Department Provider Note   ____________________________________________   Event Date/Time   First MD Initiated Contact with Patient 07/13/21 [id number]     (approximate)  I have reviewed the triage vital signs and the nursing notes.   HISTORY  Chief Complaint Shoulder Pain    HPI Jamie Finley is a 60 y.o. male who presents for left shoulder and neck pain  LOCATION: Left shoulder neck DURATION: 24 hours prior to arrival TIMING: Stable since onset SEVERITY: Reportedly severe QUALITY: Aching pain that radiates down the left arm CONTEXT: Patient states that when he woke yesterday began having left neck/shoulder pain that radiates down the left arm MODIFYING FACTORS: Movement at the neck/shoulder worsens this pain is partially relieved at rest ASSOCIATED SYMPTOMS: Radiating pain down the left arm   Per medical record review, patient has history of type 2 diabetes          Past Medical History:  Diagnosis Date   Arthritis    Depression    Diabetes mellitus without complication ([place])    Frequent headaches    Gout    Hyperlipidemia    Hypertension    Stroke [facility])     Patient Active Problem List   Diagnosis Date Noted   Hyponatremia 05/04/2021   Accidental fall 05/04/2021   Dysthymia 05/03/2021   Pre-syncope 02/25/2021   Decreased mobility of joint 01/28/2021   Peripheral edema 01/05/2021   Erectile dysfunction 07/23/2020   BMI 40.0-44.9, adult ([place]) 07/23/2020   Cardiomegaly 02/25/2019   History of DVT (deep vein thrombosis) 12/24/2018   Cauda equina syndrome with neurogenic bladder ([place]) 12/10/2018   Chronic constipation 12/10/2018   Gout of right foot 09/25/2017   Bilateral knee pain 08/24/2017   MDD (recurrent major depressive disorder) in remission ([place]) 08/24/2017   Acute midline low back pain without sciatica 02/27/2017   History of CVA (cerebrovascular accident) 01/15/2016   Hyperlipidemia associated  with type 2 diabetes mellitus ([place]) 02/18/2015   Peripheral autonomic neuropathy due to diabetes mellitus ([place]) 02/18/2015   Diabetes mellitus type 2, uncontrolled, with complications 56/31/4970   Hypertension associated with diabetes ([place]) 12/15/2014   Morbid obesity ([place]) 12/15/2014    Past Surgical History:  Procedure Laterality Date   BACK SURGERY  2007   [facility]   COLONOSCOPY WITH PROPOFOL N/A 01/27/2020   Procedure: COLONOSCOPY WITH PROPOFOL;  Surgeon: Jonathon Bellows, MD;  Location: [facility] ENDOSCOPY;  Service: Gastroenterology;  Laterality: N/A;   COLONOSCOPY WITH PROPOFOL N/A 01/28/2020   Procedure: COLONOSCOPY WITH PROPOFOL;  Surgeon: Jonathon Bellows, MD;  Location: [facility] ENDOSCOPY;  Service: Gastroenterology;  Laterality: N/A;   TONSILLECTOMY      Prior to Admission medications   Medication Sig Start Date End Date Taking? Authorizing Provider  cyclobenzaprine (FLEXERIL) 10 MG tablet Take 1 tablet (10 mg total) by mouth 3 (three) times daily as needed for muscle spasms. 07/13/21  Yes Naaman Plummer, MD  Accu-Chek Softclix Lancets lancets Use to check fasting blood sugar daily 03/01/21   Bedsole, Amy E, MD  acetaminophen (TYLENOL) 325 MG tablet Take 325 mg by mouth every 4 (four) hours as needed.     [provider]  aspirin 81 MG tablet Take 81 mg by mouth daily.    Roselee Nova, MD  blood glucose meter kit and supplies Dispense based on patient and insurance preference. Use to check blood sugar two times a day. 12/12/18   [name], MD  Blood Glucose Monitoring Suppl (ACCU-CHEK  GUIDE) w/Device KIT Use to check blood fasting blood sugar daily 03/01/21   Bedsole, Amy E, MD  Cholecalciferol (VITAMIN D3) 50 MCG (2000 UT) TABS Take 5-7 tablets by mouth daily.    [provider]  Clotrimazole 1 % OINT Apply to affected area twice daily. 07/23/20   Bedsole, Amy E, MD  colchicine 0.6 MG tablet Take 1 tablet (0.6 mg total) by mouth daily as needed. 09/10/19   Bedsole, Amy E, MD   gabapentin (NEURONTIN) 400 MG capsule Take 1 capsule (400 mg total) by mouth daily as needed. 07/23/20   Bedsole, Amy E, MD  glipiZIDE (GLUCOTROL XL) 10 MG 24 hr tablet Take 10 mg by mouth daily with breakfast.    [provider]  glucose blood (ACCU-CHEK GUIDE) test strip Use to check blood fasting blood sugar daily 03/01/21   Bedsole, Amy E, MD  hydrochlorothiazide (HYDRODIURIL) 25 MG tablet Take 1 tablet (25 mg total) by mouth daily as needed. 05/23/21   Bedsole, Amy E, MD  lisinopril (ZESTRIL) 10 MG tablet TAKE ONE-HALF TABLET BY MOUTH ONCE DAILY 03/11/21   Bedsole, Amy E, MD  metFORMIN (GLUCOPHAGE-XR) 500 MG 24 hr tablet Take 1 tablet (500 mg total) by mouth 2 (two) times daily. 05/19/21   Bedsole, Amy E, MD  methocarbamol (ROBAXIN) 750 MG tablet Take 1 tablet (750 mg total) by mouth daily as needed for muscle spasms. 09/10/19   Bedsole, Amy E, MD  Multiple Vitamins-Minerals (ZINC PO) Take 1 tablet by mouth daily.    [provider]  pioglitazone (ACTOS) 30 MG tablet Take 1 tablet (30 mg total) by mouth daily. 10/29/20   Bedsole, Amy E, MD  polyethylene glycol (MIRALAX / GLYCOLAX) packet Take 17 g by mouth daily. 06/22/18   Bettey Costa, MD  rosuvastatin (CRESTOR) 20 MG tablet TAKE 1 TABLET BY MOUTH AT BEDTIME 01/27/21   Bedsole, Amy E, MD    Allergies Patient has no known allergies.  Family History  Problem Relation Age of Onset   Cancer Mother        face and jaw   Cancer Father        kidney   Early death Father    Diabetes Maternal Grandmother    Depression Maternal Grandfather    Diabetes Maternal Grandfather    Hearing loss Maternal Grandfather     Social History Social History   Tobacco Use   Smoking status: Former    Types: Cigarettes    Quit date: 09/11/1989    Years since quitting: 31.8   Smokeless tobacco: Never  Vaping Use   Vaping Use: Never used  Substance Use Topics   Alcohol use: Yes    Comment: rarely    Drug use: No    Review of  Systems Constitutional: No fever/chills Eyes: No visual changes. ENT: No sore throat. Cardiovascular: Denies chest pain. Respiratory: Denies shortness of breath. Gastrointestinal: No abdominal pain.  No nausea, no vomiting.  No diarrhea. Genitourinary: Negative for dysuria. Musculoskeletal: Positive for acute left neck and left shoulder pain Skin: Negative for rash. Neurological: Negative for headaches, weakness/numbness/paresthesias in any extremity Psychiatric: Negative for suicidal ideation/homicidal ideation   ____________________________________________   PHYSICAL EXAM:  VITAL SIGNS: ED Triage Vitals  Enc Vitals Group     BP 07/13/21 0353 107/64     Pulse Rate 07/13/21 0353 87     Resp 07/13/21 0353 18     Temp 07/13/21 0353 98.1 F (36.7 C)     Temp Source 07/13/21  0353 Oral     SpO2 07/13/21 0353 97 %     Weight 07/13/21 0351 (!) 325 lb (147.4 kg)     Height 07/13/21 0351 6' (1.829 m)     Head Circumference --      Peak Flow --      Pain Score 07/13/21 0350 9     Pain Loc --      Pain Edu? --      Excl. in [place]? --    Constitutional: Alert and oriented. Well appearing and in no acute distress. Eyes: Conjunctivae are normal. PERRL. Head: Atraumatic. Nose: No congestion/rhinnorhea. Mouth/Throat: Mucous membranes are moist. Neck: No stridor.  Positive Spurling test on the left Cardiovascular: Grossly normal heart sounds.  Good peripheral circulation. Respiratory: Normal respiratory effort.  No retractions. Gastrointestinal: Soft and nontender. No distention. Musculoskeletal: No obvious deformities.  Tenderness with range of motion at the left shoulder and with palpation of the left neck cervical paraspinal musculature Neurologic:  Normal speech and language. No gross focal neurologic deficits are appreciated. Skin:  Skin is warm and dry. No rash noted. Psychiatric: Mood and affect are normal. Speech and behavior are  normal.  ____________________________________________   LABS (all labs ordered are listed, but only abnormal results are displayed)  Labs Reviewed  CBC WITH DIFFERENTIAL/PLATELET - Abnormal; Notable for the following components:      Result Value   HCT 38.4 (*)    All other components within normal limits  COMPREHENSIVE METABOLIC PANEL - Abnormal; Notable for the following components:   BUN 21 (*)    All other components within normal limits  TROPONIN I (HIGH SENSITIVITY)  TROPONIN I (HIGH SENSITIVITY)   ____________________________________________  EKG  ED ECG REPORT I, Naaman Plummer, the attending physician, personally viewed and interpreted this ECG.  Date: 07/13/2021 EKG Time: 0358 Rate: 82 Rhythm: normal sinus rhythm QRS Axis: normal Intervals: normal ST/T Wave abnormalities: normal Narrative Interpretation: no evidence of acute ischemia  PROCEDURES  Procedure(s) performed (including Critical Care):  Procedures   ____________________________________________   INITIAL IMPRESSION / ASSESSMENT AND PLAN / ED COURSE  As part of my medical decision making, I reviewed the following data within the electronic medical record, if available:  Nursing notes reviewed and incorporated, Labs reviewed, EKG interpreted, Old chart reviewed, Radiograph reviewed and Notes from prior ED visits reviewed and incorporated        + neck pain + sensation of weakness/radiating pain.  ED Workup: Defer C-Spine imaging given negative by NEXUS criteria  Given History, Exam the patient appears to have a cervical radiculopathy.   Patient appears to be low risk for complications or other emergent conditions such as  anginal equivalent, frank cervical instability, arterial dissection, osteomyelitis, epidural abscess, central cord syndrome, c-spine fracture, other spinal emergencies  Rx: NSAIDs, outpatient physical therapy evaluation and recommendation for home exercises in the  interim Disposition: Discharge. The patient has been given strict return precautions and understands the need to follow up within 48 hours with their primary care provider      ____________________________________________   FINAL CLINICAL IMPRESSION(S) / ED DIAGNOSES  Final diagnoses:  Acute pain of left shoulder  Neck pain on left side  Cervical radiculopathy, acute     ED Discharge Orders          Ordered    cyclobenzaprine (FLEXERIL) 10 MG tablet  3 times daily PRN        07/13/21 1008  Note:  This document was prepared using Dragon voice recognition software and may include unintentional dictation errors.    Naaman Plummer, MD 07/13/21 1013

## 2021-07-13 NOTE — Discharge Instructions (Addendum)
Please try using the cyclobenzaprine instead of methocarbamol for continued left neck/shoulder pain

## 2021-07-13 NOTE — ED Triage Notes (Signed)
Pt to triage via w/c with no distress noted; pt c/o left shoulder pain radiating into arm and scapula since yesterday accomp by [facility]; st hx of same and received steroid injection for "frozen shoulder"

## 2021-07-18 LAB — HM DIABETES FOOT EXAM

## 2021-07-19 ENCOUNTER — Other Ambulatory Visit: Payer: Self-pay

## 2021-07-19 ENCOUNTER — Ambulatory Visit (INDEPENDENT_AMBULATORY_CARE_PROVIDER_SITE_OTHER): Payer: Medicare Other | Admitting: Family Medicine

## 2021-07-19 VITALS — BP 108/62 | HR 86 | Temp 98.2°F | Resp 14 | Ht 72.0 in | Wt 334.1 lb

## 2021-07-19 DIAGNOSIS — S29012D Strain of muscle and tendon of back wall of thorax, subsequent encounter: Secondary | ICD-10-CM | POA: Diagnosis not present

## 2021-07-19 MED ORDER — DICLOFENAC SODIUM 75 MG PO TBEC: 75.0000 mg | DELAYED_RELEASE_TABLET | Freq: Two times a day (BID) | ORAL | 0 refills | Status: DC

## 2021-07-19 NOTE — Patient Instructions (Addendum)
Start diclofenac twice daily.  Start home PT, heat on back.  Stop ibuprofen.  Can also use tylenol as needed.  Change to cyclobenzaprine as needed  only at bedtime  for muscle spasm or use robaxin as you were previously.

## 2021-07-19 NOTE — Progress Notes (Signed)
Patient ID: Jamie Finley, male    DOB: April 04, 1961, 60 y.o.   MRN: [id number]  This visit was conducted in person.  BP 108/62   Pulse 86   Temp 98.2 F (36.8 C)   Resp 14   Ht 6' (1.829 m)   Wt (!) 334 lb 2 oz (151.6 kg)   SpO2 100%   BMI 45.32 kg/m    CC: Chief Complaint  Patient presents with   ER follow up    Patient also has noticed burning sensation near right lower shoulder blade area and right breast/chest are started 2 nights ago.    Subjective:   HPI: Jamie Finley is a 60 y.o. male presenting on 07/19/2021 for ER follow up (Patient also has noticed burning sensation near right lower shoulder blade area and right breast/chest are started 2 nights ago.)  Seen in ED on 11/2 with left shoulder and neck pain  Dx with cervical radiculopathy  Neg EKG, troponin, cbc and BMET.  Treated with NSAID and cyclobenzaprine, referred to PT.     Today he reports pain has not improved.  Pain has now migrated to right upper back/neck.  Noted new burning sensation below shoulder blade on right and anterior chest.  No rash noted.  Pain 7-8/1 on pain scale  Tylenol as well as ibuprofen not helping.  Cyclobenzaprine not helping much.. taking 3 times daily.  No proceeding fall or change in activity. 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 no BM in 1 week. Using OTC stool softner... is always constipation  Felling more weak overall, fatigued.Abner Greenspan dud to cyclobenzaprine.   Relevant past medical, surgical, family and social history reviewed and updated as indicated. Interim medical history since our last visit reviewed. Allergies and medications reviewed and updated. Outpatient Medications Prior to Visit  Medication Sig Dispense Refill   Accu-Chek Softclix Lancets lancets Use to check fasting blood sugar daily 100 each 3   acetaminophen (TYLENOL) 325 MG tablet Take 325 mg by  mouth every 4 (four) hours as needed.      aspirin 81 MG tablet Take 81 mg by mouth daily.     blood glucose meter kit and supplies Dispense based on patient and insurance preference. Use to check blood sugar two times a day. 1 each 0   Blood Glucose Monitoring Suppl (ACCU-CHEK GUIDE) w/Device KIT Use to check blood fasting blood sugar daily 1 kit 0   Cholecalciferol (VITAMIN D3) 50 MCG (2000 UT) TABS Take 5-7 tablets by mouth daily.     Clotrimazole 1 % OINT Apply to affected area twice daily. 15 g 0   colchicine 0.6 MG tablet Take 1 tablet (0.6 mg total) by mouth daily as needed. 30 tablet 1   cyclobenzaprine (FLEXERIL) 10 MG tablet Take 1 tablet (10 mg total) by mouth 3 (three) times daily as needed for muscle spasms. 30 tablet 0   gabapentin (NEURONTIN) 400 MG capsule Take 1 capsule (400 mg total) by mouth daily as needed. 90 capsule 3   glipiZIDE (GLUCOTROL XL) 10 MG 24 hr tablet Take 10 mg by mouth daily with breakfast.     glucose blood (ACCU-CHEK GUIDE) test strip Use to check blood fasting blood sugar daily 100 each 3   hydrochlorothiazide (HYDRODIURIL) 25 MG tablet Take 1 tablet (25 mg total) by mouth daily as needed. 90 tablet 1   lisinopril (ZESTRIL) 10  MG tablet TAKE ONE-HALF TABLET BY MOUTH ONCE DAILY 45 tablet 1   metFORMIN (GLUCOPHAGE-XR) 500 MG 24 hr tablet Take 1 tablet (500 mg total) by mouth 2 (two) times daily. 180 tablet 1   Multiple Vitamins-Minerals (ZINC PO) Take 1 tablet by mouth daily.     pioglitazone (ACTOS) 30 MG tablet Take 1 tablet (30 mg total) by mouth daily. 30 tablet 11   polyethylene glycol (MIRALAX / GLYCOLAX) packet Take 17 g by mouth daily. 14 each 0   rosuvastatin (CRESTOR) 20 MG tablet TAKE 1 TABLET BY MOUTH AT BEDTIME 90 tablet 3   methocarbamol (ROBAXIN) 750 MG tablet Take 1 tablet (750 mg total) by mouth daily as needed for muscle spasms. (Patient not taking: Reported on 07/19/2021) 30 tablet 1   No facility-administered medications prior to visit.      Per HPI unless specifically indicated in ROS section below Review of Systems  Constitutional:  Negative for fatigue and fever.  HENT:  Negative for ear pain.   Eyes:  Negative for pain.  Respiratory:  Negative for cough and shortness of breath.   Cardiovascular:  Negative for chest pain, palpitations and leg swelling.  Gastrointestinal:  Negative for abdominal pain.  Genitourinary:  Negative for dysuria.  Musculoskeletal:  Positive for arthralgias.  Neurological:  Negative for syncope, light-headedness and headaches.  Psychiatric/Behavioral:  Negative for dysphoric mood.   Objective:  BP 108/62   Pulse 86   Temp 98.2 F (36.8 C)   Resp 14   Ht 6' (1.829 m)   Wt (!) 334 lb 2 oz (151.6 kg)   SpO2 100%   BMI 45.32 kg/m   Wt Readings from Last 3 Encounters:  07/19/21 (!) 334 lb 2 oz (151.6 kg)  07/13/21 (!) 325 lb (147.4 kg)  06/04/21 (!) 325 lb (147.4 kg)      Physical Exam Constitutional:      Appearance: He is well-developed. He is obese.     Comments: Wheelchair bound  HENT:     Head: Normocephalic.     Right Ear: Hearing normal.     Left Ear: Hearing normal.     Nose: Nose normal.  Neck:     Thyroid: No thyroid mass or thyromegaly.     Vascular: No carotid bruit.     Trachea: Trachea normal.  Cardiovascular:     Rate and Rhythm: Normal rate and regular rhythm.     Pulses: Normal pulses.     Heart sounds: Heart sounds not distant. No murmur heard.   No friction rub. No gallop.     Comments: No peripheral edema Pulmonary:     Effort: Pulmonary effort is normal. No respiratory distress.     Breath sounds: Normal breath sounds.  Musculoskeletal:     Right shoulder: Tenderness present. No swelling or bony tenderness. Decreased range of motion.     Cervical back: Tenderness present. No erythema, torticollis or bony tenderness. Pain with movement present. Decreased range of motion.  Skin:    General: Skin is warm and dry.     Findings: No rash.  Psychiatric:         Speech: Speech normal.        Behavior: Behavior normal.        Thought Content: Thought content normal.      Results for orders placed or performed during the hospital encounter of 07/13/21  CBC with Differential  Result Value Ref Range   WBC 10.1 4.0 - 10.5 K/uL  RBC 4.42 4.22 - 5.81 MIL/uL   Hemoglobin 13.2 13.0 - 17.0 g/dL   HCT 38.4 (L) 39.0 - 52.0 %   MCV 86.9 80.0 - 100.0 fL   MCH 29.9 26.0 - 34.0 pg   MCHC 34.4 30.0 - 36.0 g/dL   RDW 14.5 11.5 - 15.5 %   Platelets 277 150 - 400 K/uL   nRBC 0.0 0.0 - 0.2 %   Neutrophils Relative % 54 %   Neutro Abs 5.6 1.7 - 7.7 K/uL   Lymphocytes Relative 34 %   Lymphs Abs 3.4 0.7 - 4.0 K/uL   Monocytes Relative 9 %   Monocytes Absolute 0.9 0.1 - 1.0 K/uL   Eosinophils Relative 2 %   Eosinophils Absolute 0.2 0.0 - 0.5 K/uL   Basophils Relative 1 %   Basophils Absolute 0.1 0.0 - 0.1 K/uL   Immature Granulocytes 0 %   Abs Immature Granulocytes 0.03 0.00 - 0.07 K/uL  Comprehensive metabolic panel  Result Value Ref Range   Sodium 136 135 - 145 mmol/L   Potassium 3.8 3.5 - 5.1 mmol/L   Chloride 101 98 - 111 mmol/L   CO2 26 22 - 32 mmol/L   Glucose, Bld 92 70 - 99 mg/dL   BUN 21 (H) 6 - 20 mg/dL   Creatinine, Ser 1.11 0.61 - 1.24 mg/dL   Calcium 9.2 8.9 - 10.3 mg/dL   Total Protein 8.0 6.5 - 8.1 g/dL   Albumin 4.2 3.5 - 5.0 g/dL   AST 20 15 - 41 U/L   ALT 15 0 - 44 U/L   Alkaline Phosphatase 62 38 - 126 U/L   Total Bilirubin 1.1 0.3 - 1.2 mg/dL   GFR, Estimated >60 >60 mL/min   Anion gap 9 5 - 15  Troponin I (High Sensitivity)  Result Value Ref Range   Troponin I (High Sensitivity) 4 <18 ng/L    This visit occurred during the SARS-CoV-2 public health emergency.  Safety protocols were in place, including screening questions prior to the visit, additional usage of staff PPE, and extensive cleaning of exam room while observing appropriate contact time as indicated for disinfecting solutions.   COVID 19 screen:  No recent  travel or known exposure to COVID19 The patient denies respiratory symptoms of COVID 19 at this time. The importance of social distancing was discussed today.   Assessment and Plan    Problem List Items Addressed This Visit     Upper back strain - Primary     Start diclofenac twice daily.  Start home PT, heat on back.  Stop ibuprofen.  Can also use tylenol as needed.  Change to cyclobenzaprine as needed  only at bedtime  for muscle spasm or use robaxin as you were previously.      Meds ordered this encounter  Medications   DISCONTD: diclofenac (VOLTAREN) 75 MG EC tablet    Sig: Take 1 tablet (75 mg total) by mouth 2 (two) times daily.    Dispense:  30 tablet    Refill:  0     [name], MD

## 2021-08-02 ENCOUNTER — Ambulatory Visit (INDEPENDENT_AMBULATORY_CARE_PROVIDER_SITE_OTHER): Payer: Medicare Other | Admitting: Psychology

## 2021-08-02 ENCOUNTER — Other Ambulatory Visit: Payer: Self-pay

## 2021-08-02 DIAGNOSIS — F4323 Adjustment disorder with mixed anxiety and depressed mood: Secondary | ICD-10-CM

## 2021-08-09 ENCOUNTER — Other Ambulatory Visit: Payer: Self-pay

## 2021-08-09 ENCOUNTER — Encounter: Payer: Self-pay | Admitting: Family Medicine

## 2021-08-09 ENCOUNTER — Ambulatory Visit (INDEPENDENT_AMBULATORY_CARE_PROVIDER_SITE_OTHER): Payer: Medicare Other | Admitting: Family Medicine

## 2021-08-09 VITALS — BP 100/62 | HR 76 | Temp 98.7°F | Ht 72.0 in | Wt 374.0 lb

## 2021-08-09 DIAGNOSIS — I152 Hypertension secondary to endocrine disorders: Secondary | ICD-10-CM

## 2021-08-09 DIAGNOSIS — E1169 Type 2 diabetes mellitus with other specified complication: Secondary | ICD-10-CM

## 2021-08-09 DIAGNOSIS — E1165 Type 2 diabetes mellitus with hyperglycemia: Secondary | ICD-10-CM | POA: Diagnosis not present

## 2021-08-09 DIAGNOSIS — E118 Type 2 diabetes mellitus with unspecified complications: Secondary | ICD-10-CM

## 2021-08-09 DIAGNOSIS — Z0000 Encounter for general adult medical examination without abnormal findings: Secondary | ICD-10-CM | POA: Diagnosis not present

## 2021-08-09 DIAGNOSIS — F3340 Major depressive disorder, recurrent, in remission, unspecified: Secondary | ICD-10-CM | POA: Diagnosis not present

## 2021-08-09 DIAGNOSIS — E785 Hyperlipidemia, unspecified: Secondary | ICD-10-CM

## 2021-08-09 DIAGNOSIS — E1159 Type 2 diabetes mellitus with other circulatory complications: Secondary | ICD-10-CM

## 2021-08-09 DIAGNOSIS — Z125 Encounter for screening for malignant neoplasm of prostate: Secondary | ICD-10-CM | POA: Insufficient documentation

## 2021-08-09 LAB — POCT GLYCOSYLATED HEMOGLOBIN (HGB A1C): Hemoglobin A1C: 6.2 % — AB (ref 4.0–5.6)

## 2021-08-09 MED ORDER — LISINOPRIL 2.5 MG PO TABS: 5.0000 mg | ORAL_TABLET | Freq: Every day | ORAL | 11 refills | Status: DC

## 2021-08-09 MED ORDER — METHOCARBAMOL 750 MG PO TABS: 750.0000 mg | ORAL_TABLET | Freq: Every day | ORAL | 1 refills | Status: DC | PRN

## 2021-08-09 MED ORDER — GABAPENTIN 400 MG PO CAPS: 400.0000 mg | ORAL_CAPSULE | Freq: Every day | ORAL | 3 refills | Status: DC | PRN

## 2021-08-09 NOTE — Progress Notes (Signed)
Patient ID: Jamie Finley, male    DOB: 09-Jun-1961, 61 y.o.   MRN: [id number]  This visit was conducted in person.  BP 100/62   Pulse 76   Temp 98.7 F (37.1 C) (Temporal)   Ht 6' (1.829 m) Comment: patient reported  Wt (!) 374 lb (169.6 kg)   SpO2 97%   BMI 50.72 kg/m    CC: Chief Complaint  Patient presents with   Medicare Wellness    Subjective:   HPI: Jamie Finley is a 60 y.o. male presenting on 08/09/2021 for Medicare Wellness  The patient presents for annual medicare wellness, complete physical and review of chronic health problems. He/She also has the following acute concerns today:  I have personally reviewed the Medicare Annual Wellness questionnaire and have noted 1. The patient's medical and social history 2. Their use of alcohol, tobacco or illicit drugs 3. Their current medications and supplements 4. The patient's functional ability including ADL's, fall risks, home safety risks and hearing or visual             impairment. 5. Diet and physical activities 6. Evidence for depression or mood disorders 7.         Updated provider list Cognitive evaluation was performed and recorded on pt medicare questionnaire form. The patients weight, height, BMI and visual acuity have been recorded in the chart   I have made referrals, counseling and provided education to the patient based review of the above and I have provided the pt with a written personalized care plan for preventive services.   Documentation of this information was scanned into the electronic record under the media tab.   Advance directives and end of life planning reviewed in detail with patient and documented in EMR. Patient given handout on advance care directives if needed. HCPOA and living will updated if needed.  Fall Risk 02/08/2021 02/22/2021 06/04/2021 07/13/2021 08/09/2021  Falls in the past year? - - - - 1  Was there an injury with Fall? - - - - 0  Fall Risk Category Calculator - - - -  1  Fall Risk Category - - - - Low  Patient Fall Risk Level Low fall risk High fall risk Moderate fall risk Low fall risk -    Hearing Screening  Method: Audiometry   _0  _1  _2  _3   Right ear _4 Left ear _5 Vision Screening - Comments:: Eye Exam with Jomarie Longs, OD on 02/24/2021  [place] Office Visit from 07/23/2020 in [place] at [facility] Total Score 0      Recently seen for left shoulder and neck pain  Dx with cervical radiculopathy  Started on diclofenac, home PT  Pain has improved.  No new weakness, or numbness in arm.  Had a bad head cold in last 3 weeks, he is still having dry cough.No SOB, no wheeze. Always has some morning congestion.  He has tried cough drops.   Hypertension:    BP Readings from Last 3 Encounters:  08/09/21 100/62  07/19/21 108/62  07/13/21 113/65  Using medication without problems or lightheadedness:  Chest pain with exertion: Edema: Short of breath: Average home BPs: Other issues:  Diabetes:   A1C 6.2 Using medications without difficulties: Hypoglycemic episodes: Hyperglycemic episodes: Feet problems: Blood Sugars averaging: eye exam within last year:   Elevated Cholesterol:  Lab Results  Component Value Date   CHOL 139 04/26/2021   HDL 42.90 04/26/2021  LDLCALC 62 03/02/2020   LDLDIRECT 73.0 04/26/2021   TRIG 202.0 (H) 04/26/2021   CHOLHDL 3 04/26/2021  Using medications without problems: Muscle aches:  Diet compliance: Exercise: Other complaints:       Relevant past medical, surgical, family and social history reviewed and updated as indicated. Interim medical history since our last visit reviewed. Allergies and medications reviewed and updated. Outpatient Medications Prior to Visit  Medication Sig Dispense Refill   Accu-Chek Softclix Lancets lancets Use to check fasting blood sugar daily 100 each 3   acetaminophen (TYLENOL) 325 MG tablet Take 325 mg by  mouth every 4 (four) hours as needed.      aspirin 81 MG tablet Take 81 mg by mouth daily.     blood glucose meter kit and supplies Dispense based on patient and insurance preference. Use to check blood sugar two times a day. 1 each 0   Blood Glucose Monitoring Suppl (ACCU-CHEK GUIDE) w/Device KIT Use to check blood fasting blood sugar daily 1 kit 0   Cholecalciferol (VITAMIN D3) 50 MCG (2000 UT) TABS Take 5-7 tablets by mouth daily.     colchicine 0.6 MG tablet Take 1 tablet (0.6 mg total) by mouth daily as needed. 30 tablet 1   cyclobenzaprine (FLEXERIL) 10 MG tablet Take 1 tablet (10 mg total) by mouth 3 (three) times daily as needed for muscle spasms. 30 tablet 0   gabapentin (NEURONTIN) 400 MG capsule Take 1 capsule (400 mg total) by mouth daily as needed. 90 capsule 3   glipiZIDE (GLUCOTROL XL) 10 MG 24 hr tablet Take 10 mg by mouth daily with breakfast.     glucose blood (ACCU-CHEK GUIDE) test strip Use to check blood fasting blood sugar daily 100 each 3   hydrochlorothiazide (HYDRODIURIL) 25 MG tablet Take 1 tablet (25 mg total) by mouth daily as needed. 90 tablet 1   lisinopril (ZESTRIL) 10 MG tablet TAKE ONE-HALF TABLET BY MOUTH ONCE DAILY 45 tablet 1   metFORMIN (GLUCOPHAGE-XR) 500 MG 24 hr tablet Take 1 tablet (500 mg total) by mouth 2 (two) times daily. 180 tablet 1   methocarbamol (ROBAXIN) 750 MG tablet Take 1 tablet (750 mg total) by mouth daily as needed for muscle spasms. 30 tablet 1   Multiple Vitamins-Minerals (ZINC PO) Take 1 tablet by mouth daily.     pioglitazone (ACTOS) 30 MG tablet Take 1 tablet (30 mg total) by mouth daily. 30 tablet 11   polyethylene glycol (MIRALAX / GLYCOLAX) packet Take 17 g by mouth daily. 14 each 0   rosuvastatin (CRESTOR) 20 MG tablet TAKE 1 TABLET BY MOUTH AT BEDTIME 90 tablet 3   Clotrimazole 1 % OINT Apply to affected area twice daily. 15 g 0   diclofenac (VOLTAREN) 75 MG EC tablet Take 1 tablet (75 mg total) by mouth 2 (two) times daily. 30  tablet 0   No facility-administered medications prior to visit.     Per HPI unless specifically indicated in ROS section below Review of Systems  Constitutional:  Negative for fatigue and fever.  HENT:  Negative for ear pain.   Eyes:  Negative for pain.  Respiratory:  Negative for cough and shortness of breath.   Cardiovascular:  Negative for chest pain, palpitations and leg swelling.  Gastrointestinal:  Negative for abdominal pain.  Genitourinary:  Negative for dysuria.  Musculoskeletal:  Negative for arthralgias.  Neurological:  Negative for syncope, light-headedness and headaches.  Psychiatric/Behavioral:  Negative for dysphoric mood.   Objective:  BP  100/62   Pulse 76   Temp 98.7 F (37.1 C) (Temporal)   Ht 6' (1.829 m) Comment: patient reported  Wt (!) 374 lb (169.6 kg)   SpO2 97%   BMI 50.72 kg/m   Wt Readings from Last 3 Encounters:  08/09/21 (!) 374 lb (169.6 kg)  07/19/21 (!) 334 lb 2 oz (151.6 kg)  07/13/21 (!) 325 lb (147.4 kg)      Physical Exam Constitutional:      Appearance: He is well-developed. He is obese.     Comments: Wheelchair bound  HENT:     Head: Normocephalic.     Right Ear: Hearing normal.     Left Ear: Hearing normal.     Nose: Nose normal.  Neck:     Thyroid: No thyroid mass or thyromegaly.     Vascular: No carotid bruit.     Trachea: Trachea normal.  Cardiovascular:     Rate and Rhythm: Normal rate and regular rhythm.     Pulses: Normal pulses.     Heart sounds: Heart sounds not distant. No murmur heard.   No friction rub. No gallop.     Comments: No peripheral edema Pulmonary:     Effort: Pulmonary effort is normal. No respiratory distress.     Breath sounds: Normal breath sounds.  Skin:    General: Skin is warm and dry.     Findings: No rash.  Psychiatric:        Speech: Speech normal.        Behavior: Behavior normal.        Thought Content: Thought content normal.    Diabetic foot exam: Normal inspection No skin  breakdown No calluses  Normal DP pulses Decreased sensation to light touch and monofilament Nails normal     Results for orders placed or performed during the hospital encounter of 07/13/21  CBC with Differential  Result Value Ref Range   WBC 10.1 4.0 - 10.5 K/uL   RBC 4.42 4.22 - 5.81 MIL/uL   Hemoglobin 13.2 13.0 - 17.0 g/dL   HCT 38.4 (L) 39.0 - 52.0 %   MCV 86.9 80.0 - 100.0 fL   MCH 29.9 26.0 - 34.0 pg   MCHC 34.4 30.0 - 36.0 g/dL   RDW 14.5 11.5 - 15.5 %   Platelets 277 150 - 400 K/uL   nRBC 0.0 0.0 - 0.2 %   Neutrophils Relative % 54 %   Neutro Abs 5.6 1.7 - 7.7 K/uL   Lymphocytes Relative 34 %   Lymphs Abs 3.4 0.7 - 4.0 K/uL   Monocytes Relative 9 %   Monocytes Absolute 0.9 0.1 - 1.0 K/uL   Eosinophils Relative 2 %   Eosinophils Absolute 0.2 0.0 - 0.5 K/uL   Basophils Relative 1 %   Basophils Absolute 0.1 0.0 - 0.1 K/uL   Immature Granulocytes 0 %   Abs Immature Granulocytes 0.03 0.00 - 0.07 K/uL  Comprehensive metabolic panel  Result Value Ref Range   Sodium 136 135 - 145 mmol/L   Potassium 3.8 3.5 - 5.1 mmol/L   Chloride 101 98 - 111 mmol/L   CO2 26 22 - 32 mmol/L   Glucose, Bld 92 70 - 99 mg/dL   BUN 21 (H) 6 - 20 mg/dL   Creatinine, Ser 1.11 0.61 - 1.24 mg/dL   Calcium 9.2 8.9 - 10.3 mg/dL   Total Protein 8.0 6.5 - 8.1 g/dL   Albumin 4.2 3.5 - 5.0 g/dL   AST 20 15 -  41 U/L   ALT 15 0 - 44 U/L   Alkaline Phosphatase 62 38 - 126 U/L   Total Bilirubin 1.1 0.3 - 1.2 mg/dL   GFR, Estimated >60 >60 mL/min   Anion gap 9 5 - 15  Troponin I (High Sensitivity)  Result Value Ref Range   Troponin I (High Sensitivity) 4 <18 ng/L    This visit occurred during the SARS-CoV-2 public health emergency.  Safety protocols were in place, including screening questions prior to the visit, additional usage of staff PPE, and extensive cleaning of exam room while observing appropriate contact time as indicated for disinfecting solutions.   COVID 19 screen:  No recent travel  or known exposure to COVID19 The patient denies respiratory symptoms of COVID 19 at this time. The importance of social distancing was discussed today.   Assessment and Plan The patient's preventative maintenance and recommended screening tests for an annual wellness exam were reviewed in full today. Brought up to date unless services declined.  Counselled on the importance of diet, exercise, and its role in overall health and mortality. The patient's FH and SH was reviewed, including their home life, tobacco status, and drug and alcohol status.   PSA due for re-eval. Vaccine:  Medicare does not cover Td, refused flu.Discussed COVID19 vaccine side effects and benefits. Strongly encouraged the patient to get the vaccine. Questions answered.  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 Dr. Vicente Males.  Non smoker:  Problem List Items Addressed This Visit     Hyperlipidemia associated with type 2 diabetes mellitus ([place]) (Chronic)    Stable, chronic.  Continue current medication.    Encouraged exercise, weight loss, healthy eating habits.       Relevant Medications   lisinopril (ZESTRIL) 2.5 MG tablet   Hypertension associated with diabetes (HCC) (Chronic)     Try lower dose of lisinopril  for kidney protection , hopefully not dropping BP too low.          Relevant Medications   lisinopril (ZESTRIL) 2.5 MG tablet   MDD (recurrent major depressive disorder) in remission (HCC)    Stable, chronic.  Continue current medication.         Morbid obesity ([place])    Limited in activity given  Disability from back   Keep up the great on diet change, increase activity.      Screening PSA (prostate specific antigen)   Other Visit Diagnoses     Medicare annual wellness visit, subsequent    -  Primary   Poorly controlled type 2 diabetes mellitus with complication (Post)       Relevant Medications   lisinopril (ZESTRIL) 2.5 MG tablet    Other Relevant Orders   POCT glycosylated hemoglobin (Hb A1C) (Completed)   Hemoglobin A1c   Lipid panel   Comprehensive metabolic panel   PSA, Medicare   Prostate cancer screening       Relevant Orders   PSA, Medicare          [name], MD

## 2021-08-09 NOTE — Assessment & Plan Note (Signed)
Chronic, now at goal  Continue glipizide 1 mg XL  Metformin 500 mg 2 tabs daily  Actos 30 mg daily

## 2021-08-09 NOTE — Patient Instructions (Signed)
Try lower dose of lisinopril  for kidney protection , hopefully not dropping BP too low.  Keep up the great on diet change, increase activity.

## 2021-08-20 ENCOUNTER — Other Ambulatory Visit: Payer: Self-pay | Admitting: Family Medicine

## 2021-08-22 ENCOUNTER — Ambulatory Visit: Payer: Medicare Other | Admitting: Family Medicine

## 2021-08-30 DIAGNOSIS — S29012A Strain of muscle and tendon of back wall of thorax, initial encounter: Secondary | ICD-10-CM | POA: Insufficient documentation

## 2021-08-30 NOTE — Assessment & Plan Note (Signed)
Start diclofenac twice daily.  Start home PT, heat on back.  Stop ibuprofen.  Can also use tylenol as needed.  Change to cyclobenzaprine as needed  only at bedtime  for muscle spasm or use robaxin as you were previously.

## 2021-09-20 ENCOUNTER — Ambulatory Visit (INDEPENDENT_AMBULATORY_CARE_PROVIDER_SITE_OTHER): Payer: Medicare PPO | Admitting: Psychology

## 2021-09-20 ENCOUNTER — Other Ambulatory Visit: Payer: Self-pay

## 2021-09-20 DIAGNOSIS — F4323 Adjustment disorder with mixed anxiety and depressed mood: Secondary | ICD-10-CM | POA: Diagnosis not present

## 2021-09-20 NOTE — Progress Notes (Signed)
Jamie Finley Counselor/Therapist Progress Note  Patient ID: Jamie Finley, MRN: [id number]    Date: 09/20/21  Time Spent: 2:00  pm - 2:49 pm  49 Minutes  Treatment Type: Individual Therapy.  Reported Symptoms: Anxiety, interpersonal stressors.   Mental Status Exam: Appearance:  Fairly Groomed     Behavior: Appropriate  Motor: Normal  Speech/Language:  Clear and Coherent  Affect: Appropriate  Mood: anxious  Thought process: normal  Thought content:   WNL  Sensory/Perceptual disturbances:   WNL  Orientation: oriented to person, place, and time/date  Attention: Good  Concentration: Good  Memory: WNL  Fund of knowledge:  Good  Insight:   Good  Judgment:  Good  Impulse Control: Good   Risk Assessment: Danger to Self:  No Self-injurious Behavior: No Danger to Others: No Duty to Warn:no Physical Aggression / Violence:No  Access to Firearms a concern: No  Gang Involvement:No   Subjective:   Jamie Finley participated in the session, in person in the office with the therapist, and consented to treatment. Jamie Finley reviewed the events of the past week. Jamie Finley engagement in his overall self-care.  He discussed making more mindful choices regarding his dietary intake and noted recently signing up for the gym to increase physical activity.  He noted his intent to be more mindful of treatment of his diabetes diagnosis.  Therapist praised Jamie Finley for his effort to manage barriers to his aforementioned goals. We discussed its being and worked on identifying reasonable attainable goals, managing self talk related goals, and engaging and mindfulness regarding mood and overall cognition to maintain overall motivation and drive. Jamie Finley noted his interest and reconnecting with his estranged daughter.  He provided in-depth history regarding their relationship and his estrangement from his daughter was 28 years old due to interpersonal relationship stressors with her mother.   Noted experiencing significant anxiety as a result of his interest in reaching out.  Therapist encouraged him to create a list of his concerns and delineate that list between areas of control and lack of control to be processed during follow-up appointments.  Therapist normalized and validated his feelings and provided psychoeducation regarding anxiety.Jamie Finley was engaged and motivated during our session and expressed commitment towards his goals.  Therapist praised him for his effort and energy during session and provided supportive therapy. Jamie Finley noted being conflict averse and often getting stressed during arguments or disagreements.  We will work on processing this going forward.   Interventions: Interpersonal and Behavioral Activation  Diagnosis:   Adjustment disorder with mixed anxiety and depressed mood   Treatment Plan:  Client Abilities/Strengths Jamie Finley is i intelligent, forthcoming, and motivated for change.  Support System: Friends and church members.  Client Treatment Preferences Outpatient therapy  Client Statement of Needs Jamie Finley discussed his goals for treatment including improving his self-esteem, improving his overall outlook, managing his overall symptoms, becoming more self-accepting, identify source for mood changes, and improve overall functioning.  Treatment Level Weekly  Symptoms  Depression: Feeling down, loss of interest, insomnia (medical/health), lethargy, overeating, difficulty concentrating, feeling bad about self.   (Status: maintained) Anxiety: feeling anxious, difficulty managing worry (not being married again, dying alone, trouble relaxing, restlessness, irritability, feeling afraid that something awful might happen.    (Status: maintained)  Goals:   Jamie Finley experiences symptoms of depression and anxiety/   Target Date: 08-02-22 Frequency: Weekly  Progress: 0 Modality: individual    Therapist will provide referrals for additional  resources as appropriate.  Therapist will provide psycho-education regarding Jamie Finley's diagnosis  and corresponding treatment approaches and interventions. Licensed Clinical Social Worker, [place], LCSW will support the patient's ability to achieve the goals identified. will employ CBT, BA, Problem-solving, Solution Focused, Mindfulness,  coping skills, & other evidenced-based practices will be used to promote progress towards healthy functioning to help manage decrease symptoms associated with his diagnosis.   Reduce overall level, frequency, and intensity of the feelings of depression & anxiety as evidenced by decreased overall symptoms from 6 to 7 days/week to 0 to 1 days/week per client report for at least 3 consecutive months. Verbally express understanding of the relationship between feelings of depression, anxiety and their impact on thinking patterns and behaviors. Verbalize an understanding of the role that distorted thinking plays in creating fears, excessive worry, and ruminations.  Jamie Finley participated in the creation of the treatment plan)      Jamie Irish, LCSW

## 2021-09-21 ENCOUNTER — Other Ambulatory Visit: Payer: Self-pay | Admitting: Family Medicine

## 2021-10-04 ENCOUNTER — Ambulatory Visit (INDEPENDENT_AMBULATORY_CARE_PROVIDER_SITE_OTHER): Payer: Medicare PPO | Admitting: Psychology

## 2021-10-04 ENCOUNTER — Other Ambulatory Visit: Payer: Self-pay

## 2021-10-04 DIAGNOSIS — F4323 Adjustment disorder with mixed anxiety and depressed mood: Secondary | ICD-10-CM | POA: Diagnosis not present

## 2021-10-04 NOTE — Progress Notes (Signed)
[place] Counselor/Therapist Progress Note  Patient ID: Jamie Finley, MRN: [id number]    Date: 10/04/21  Time Spent: 2:03  pm - 2:58 pm : 55 Minutes  Treatment Type: Individual Therapy.  Reported Symptoms: Anxiety, interpersonal stressors.   Mental Status Exam: Appearance:  Fairly Groomed     Behavior: Appropriate  Motor: Normal  Speech/Language:  Clear and Coherent  Affect: Appropriate  Mood: anxious  Thought process: normal  Thought content:   WNL  Sensory/Perceptual disturbances:   WNL  Orientation: oriented to person, place, and time/date  Attention: Good  Concentration: Good  Memory: WNL  Fund of knowledge:  Good  Insight:   Good  Judgment:  Good  Impulse Control: Good   Risk Assessment: Danger to Self:  No Self-injurious Behavior: No Danger to Others: No Duty to Warn:no Physical Aggression / Violence:No  Access to Firearms a concern: No  Gang Involvement:No   Subjective:   Jamie Finley participated in the session, in person in the office with the therapist, and consented to treatment. Cedar reviewed the events of the past week. Yuri noted difficulty achieving the previously identified. Therapist employed BA principles to identify barriers to exercise including poor sleep, medical issues (stiffness due to arthritis) , scheduling (cooking a meal, time to loosen up), forgetfulness (getting side-tracked), lethargy, low motivation, and judgement from others.  We worked on identifying ways to address barriers proactively and consistently. We discussed employing positive reinforcers and therapist encouraged Jamie Finley to identify healthful motivating factors that could aid in reinforcing his behavior change. We worked on identifying his values that align with the aforementioned goal. Jamie Finley was engaged and motivated during our session and expressed commitment towards his goals.  Therapist praised him for his effort and energy during session and  provided supportive therapy. Jamie Finley noted being conflict averse and often getting stressed during arguments or disagreements.  We will work on processing this going forward.   Interventions:  Behavioral Activation  Diagnosis:   Adjustment disorder with mixed anxiety and depressed mood   Treatment Plan:  Client Abilities/Strengths Mariusz is i intelligent, forthcoming, and motivated for change.  Support System: Friends and church members.  Client Treatment Preferences Outpatient therapy  Client Statement of Needs Jamie Finley discussed his goals for treatment including improving his self-esteem, improving his overall outlook, managing his overall symptoms, becoming more self-accepting, identify source for mood changes, and improve overall functioning.  Treatment Level Weekly  Symptoms  Depression: Feeling down, loss of interest, insomnia (medical/health), lethargy, overeating, difficulty concentrating, feeling bad about self.   (Status: maintained) Anxiety: feeling anxious, difficulty managing worry (not being married again, dying alone, trouble relaxing, restlessness, irritability, feeling afraid that something awful might happen.    (Status: maintained)  Goals:   Jamie Finley experiences symptoms of depression and anxiety/   Target Date: 08-02-22 Frequency: Weekly  Progress: 0 Modality: individual    Therapist will provide referrals for additional resources as appropriate.  Therapist will provide psycho-education regarding Braden's diagnosis and corresponding treatment approaches and interventions. Licensed Clinical Social Worker, [place], LCSW will support the patient's ability to achieve the goals identified. will employ CBT, BA, Problem-solving, Solution Focused, Mindfulness,  coping skills, & other evidenced-based practices will be used to promote progress towards healthy functioning to help manage decrease symptoms associated with his diagnosis.   Reduce overall  level, frequency, and intensity of the feelings of depression & anxiety as evidenced by decreased overall symptoms from 6 to 7 days/week to 0 to 1 days/week per client report  for at least 3 consecutive months. Verbally express understanding of the relationship between feelings of depression, anxiety and their impact on thinking patterns and behaviors. Verbalize an understanding of the role that distorted thinking plays in creating fears, excessive worry, and ruminations. Increase engagement in self-care including consistent exercise.  Jamie Finley participated in the creation of the treatment plan)      Buena Irish, LCSW                   Buena Irish, LCSW

## 2021-10-06 NOTE — Assessment & Plan Note (Signed)
Stable, chronic.  Continue current medication.    Encouraged exercise, weight loss, healthy eating habits.

## 2021-10-06 NOTE — Assessment & Plan Note (Addendum)
Try lower dose of lisinopril  for kidney protection , hopefully not dropping BP too low.

## 2021-10-06 NOTE — Assessment & Plan Note (Signed)
Stable, chronic.  Continue current medication.    

## 2021-10-06 NOTE — Assessment & Plan Note (Signed)
Limited in activity given  Disability from back   Keep up the great on diet change, increase activity.

## 2021-10-18 ENCOUNTER — Ambulatory Visit (INDEPENDENT_AMBULATORY_CARE_PROVIDER_SITE_OTHER): Payer: Medicare PPO | Admitting: Psychology

## 2021-10-18 DIAGNOSIS — F4323 Adjustment disorder with mixed anxiety and depressed mood: Secondary | ICD-10-CM

## 2021-10-18 NOTE — Progress Notes (Signed)
[facility] Counselor/Therapist Progress Note  Patient ID: Zeppelin Commisso, MRN: [id number]    Date: 10/18/21  Time Spent: 2:03  pm - 2:59 pm : 56 Minutes  Treatment Type: Individual Therapy.  Reported Symptoms: Anxiety, interpersonal stressors.   Mental Status Exam: Appearance:  Fairly Groomed     Behavior: Appropriate  Motor: Normal  Speech/Language:  Clear and Coherent  Affect: Appropriate  Mood: anxious  Thought process: normal  Thought content:   WNL  Sensory/Perceptual disturbances:   WNL  Orientation: oriented to person, place, and time/date  Attention: Good  Concentration: Good  Memory: WNL  Fund of knowledge:  Good  Insight:   Good  Judgment:  Good  Impulse Control: Good   Risk Assessment: Danger to Self:  No Self-injurious Behavior: No Danger to Others: No Duty to Warn:no Physical Aggression / Violence:No  Access to Firearms a concern: No  Gang Involvement:No   Subjective:   Colin Ellers participated in the session, in person in the office with the therapist, and consented to treatment. Kari reviewed the events of the past week. Massiah noted mild success regarding the gym, specifically waking up earlier than previously. He noted unanticipated barriers including his wheelchair lift needing repair that affects his ability to get the gym and ambulate safely. Additional barriers include specific muscle pain. Taiyo noted his intent to contact his medical provider for guidance. We worked on shifting goals, while barriers are addressed. We highlighted a goal of waking up early and establishing a healthful nighttime sleep routine. Seth noted "freezing" when considering reaching out to his estranged daughter. He discussed worry that he could "make it worse". We worked on delineating his questions and concerns including "How is she doing? Does she have interest in meeting? Did mom paint me in a bad light? What has mom told her? Has she tried to  contact me?". He noted his worry about "drama". We worked on defining that term and the uncertainty of this action. He provided additional feedback regarding his relationship with his daughter's mother. Daughter was born 86 days after Roper's father's passing. Teron noted his daughter's mother hid the pregnancy for sometime prior to her disclosure.  Weber noted being conflict averse and often getting stressed during arguments or disagreements.  We will work on processing this going forward.Price was engaged and motivated during our session and expressed commitment towards his goals.  Therapist praised him for his effort and energy during session and provided supportive therapy.   Interventions:  Behavioral Activation  Diagnosis:   Adjustment disorder with mixed anxiety and depressed mood   Treatment Plan:  Client Abilities/Strengths Braydan is i intelligent, forthcoming, and motivated for change.  Support System: Friends and church members.  Client Treatment Preferences Outpatient therapy  Client Statement of Needs Traven discussed his goals for treatment including improving his self-esteem, improving his overall outlook, managing his overall symptoms, becoming more self-accepting, identify source for mood changes, and improve overall functioning.  Treatment Level Weekly  Symptoms  Depression: Feeling down, loss of interest, insomnia (medical/health), lethargy, overeating, difficulty concentrating, feeling bad about self.   (Status: maintained) Anxiety: feeling anxious, difficulty managing worry (not being married again, dying alone, trouble relaxing, restlessness, irritability, feeling afraid that something awful might happen.    (Status: maintained)  Goals:   [name] experiences symptoms of depression and anxiety/   Target Date: 08-02-22 Frequency: Weekly  Progress: 0 Modality: individual    Therapist will provide referrals for additional resources as  appropriate.  Therapist will  provide psycho-education regarding Ramesh's diagnosis and corresponding treatment approaches and interventions. Licensed Clinical Social Worker, [place], LCSW will support the patient's ability to achieve the goals identified. will employ CBT, BA, Problem-solving, Solution Focused, Mindfulness,  coping skills, & other evidenced-based practices will be used to promote progress towards healthy functioning to help manage decrease symptoms associated with his diagnosis.   Reduce overall level, frequency, and intensity of the feelings of depression & anxiety as evidenced by decreased overall symptoms from 6 to 7 days/week to 0 to 1 days/week per client report for at least 3 consecutive months. Verbally express understanding of the relationship between feelings of depression, anxiety and their impact on thinking patterns and behaviors. Verbalize an understanding of the role that distorted thinking plays in creating fears, excessive worry, and ruminations. Increase engagement in self-care including consistent exercise.  [name] participated in the creation of the treatment plan)   Buena Irish, LCSW

## 2021-11-01 ENCOUNTER — Other Ambulatory Visit: Payer: Self-pay

## 2021-11-01 ENCOUNTER — Ambulatory Visit (INDEPENDENT_AMBULATORY_CARE_PROVIDER_SITE_OTHER): Payer: Medicare PPO | Admitting: Psychology

## 2021-11-01 DIAGNOSIS — F4323 Adjustment disorder with mixed anxiety and depressed mood: Secondary | ICD-10-CM | POA: Diagnosis not present

## 2021-11-01 NOTE — Progress Notes (Signed)
[facility] Counselor/Therapist Progress Note  Patient ID: Jamie Finley, MRN: [id number]    Date: 11/01/21  Time Spent: 12:05  pm - 12:57 pm : 52 Minutes  Treatment Type: Individual Therapy.  Reported Symptoms: Anxiety, interpersonal stressors.   Mental Status Exam: Appearance:  Fairly Groomed     Behavior: Appropriate  Motor: Normal  Speech/Language:  Clear and Coherent  Affect: Appropriate  Mood: anxious  Thought process: normal  Thought content:   WNL  Sensory/Perceptual disturbances:   WNL  Orientation: oriented to person, place, and time/date  Attention: Good  Concentration: Good  Memory: WNL  Fund of knowledge:  Good  Insight:   Good  Judgment:  Good  Impulse Control: Good   Risk Assessment: Danger to Self:  No Self-injurious Behavior: No Danger to Others: No Duty to Warn:no Physical Aggression / Violence:No  Access to Firearms a concern: No  Gang Involvement:No   Subjective:   Jamie Finley participated in the session, in person in the office with the therapist, and consented to treatment. Jamie Finley reviewed the events of the past week. Jamie Finley noted feelings of anger and tearfulness. He noted the occurrence of this becoming more often. He noted this is due to poor ambulation. He noted feelings of helplessness. We began exploring this during the session and the effect on his mood and outlook. Therapist employed CBT principles to highlight thoughts, feelings, and behaviors related to this. Therapist normalized Jamie Finley's feelings of frustration when he is unable to complete simple tasks. We engaged in processing and problem-solving to address the aforementioned concerns. Therapist employed BA principles to aid in setting reasonable goals and addressing barriers for engagement in task completion. Jamie Finley was engaged and motivated during our session and expressed commitment towards his goals.  Therapist praised him for his effort and energy during  session and provided supportive therapy.   Interventions: Cognitive Behavioral Therapy and Behavioral Activation  Diagnosis:   Adjustment disorder with mixed anxiety and depressed mood  Treatment Plan:  Client Abilities/Strengths Hien is i intelligent, forthcoming, and motivated for change.  Support System: Friends and church members.  Client Treatment Preferences Outpatient therapy  Client Statement of Needs Jamie Finley discussed his goals for treatment including improving his self-esteem, improving his overall outlook, managing his overall symptoms, becoming more self-accepting, identify source for mood changes, and improve overall functioning.  Treatment Level Weekly  Symptoms  Depression: Feeling down, loss of interest, insomnia (medical/health), lethargy, overeating, difficulty concentrating, feeling bad about self.   (Status: maintained) Anxiety: feeling anxious, difficulty managing worry (not being married again, dying alone, trouble relaxing, restlessness, irritability, feeling afraid that something awful might happen.    (Status: maintained)  Goals:   Jamie Finley experiences symptoms of depression and anxiety/   Target Date: 08-02-22 Frequency: Weekly  Progress: 0 Modality: individual    Therapist will provide referrals for additional resources as appropriate.  Therapist will provide psycho-education regarding Donya's diagnosis and corresponding treatment approaches and interventions. Licensed Clinical Social Worker, [place], LCSW will support the patient's ability to achieve the goals identified. will employ CBT, BA, Problem-solving, Solution Focused, Mindfulness,  coping skills, & other evidenced-based practices will be used to promote progress towards healthy functioning to help manage decrease symptoms associated with his diagnosis.   Reduce overall level, frequency, and intensity of the feelings of depression & anxiety as evidenced by decreased overall  symptoms from 6 to 7 days/week to 0 to 1 days/week per client report for at least 3 consecutive months. Verbally express understanding of  the relationship between feelings of depression, anxiety and their impact on thinking patterns and behaviors. Verbalize an understanding of the role that distorted thinking plays in creating fears, excessive worry, and ruminations. Increase engagement in self-care including consistent exercise.  Jamie Finley participated in the creation of the treatment plan)   Buena Irish, LCSW

## 2021-11-09 ENCOUNTER — Other Ambulatory Visit: Payer: Self-pay

## 2021-11-09 ENCOUNTER — Other Ambulatory Visit (INDEPENDENT_AMBULATORY_CARE_PROVIDER_SITE_OTHER): Payer: Medicare PPO

## 2021-11-09 DIAGNOSIS — Z125 Encounter for screening for malignant neoplasm of prostate: Secondary | ICD-10-CM

## 2021-11-09 DIAGNOSIS — E118 Type 2 diabetes mellitus with unspecified complications: Secondary | ICD-10-CM | POA: Diagnosis not present

## 2021-11-09 DIAGNOSIS — E1165 Type 2 diabetes mellitus with hyperglycemia: Secondary | ICD-10-CM | POA: Diagnosis not present

## 2021-11-09 LAB — PSA, MEDICARE: PSA: 0.24 ng/ml (ref 0.10–4.00)

## 2021-11-09 LAB — HEMOGLOBIN A1C: Hgb A1c MFr Bld: 6.7 % — ABNORMAL HIGH (ref 4.6–6.5)

## 2021-11-09 NOTE — Progress Notes (Signed)
No critical labs need to be addressed urgently. We will discuss labs in detail at upcoming office visit.   

## 2021-11-15 ENCOUNTER — Ambulatory Visit (INDEPENDENT_AMBULATORY_CARE_PROVIDER_SITE_OTHER): Payer: Medicare PPO | Admitting: Family Medicine

## 2021-11-15 ENCOUNTER — Encounter: Payer: Self-pay | Admitting: Family Medicine

## 2021-11-15 ENCOUNTER — Other Ambulatory Visit: Payer: Self-pay

## 2021-11-15 VITALS — BP 104/60 | HR 84 | Temp 98.2°F | Ht 72.0 in | Wt 344.1 lb

## 2021-11-15 DIAGNOSIS — F3340 Major depressive disorder, recurrent, in remission, unspecified: Secondary | ICD-10-CM

## 2021-11-15 DIAGNOSIS — E1143 Type 2 diabetes mellitus with diabetic autonomic (poly)neuropathy: Secondary | ICD-10-CM | POA: Diagnosis not present

## 2021-11-15 DIAGNOSIS — F331 Major depressive disorder, recurrent, moderate: Secondary | ICD-10-CM

## 2021-11-15 DIAGNOSIS — E1159 Type 2 diabetes mellitus with other circulatory complications: Secondary | ICD-10-CM

## 2021-11-15 DIAGNOSIS — I152 Hypertension secondary to endocrine disorders: Secondary | ICD-10-CM

## 2021-11-15 MED ORDER — GABAPENTIN 400 MG PO CAPS: 400.0000 mg | ORAL_CAPSULE | Freq: Two times a day (BID) | ORAL | 3 refills | Status: DC

## 2021-11-15 MED ORDER — LISINOPRIL 2.5 MG PO TABS: 2.5000 mg | ORAL_TABLET | Freq: Every day | ORAL | 0 refills | Status: DC

## 2021-11-15 MED ORDER — METFORMIN HCL ER 500 MG PO TB24: 1000.0000 mg | ORAL_TABLET | Freq: Two times a day (BID) | ORAL | 1 refills | Status: DC

## 2021-11-15 MED ORDER — HYDROCHLOROTHIAZIDE 25 MG PO TABS: 25.0000 mg | ORAL_TABLET | Freq: Every day | ORAL | 0 refills | Status: DC

## 2021-11-15 NOTE — Progress Notes (Unsigned)
Patient ID: Jamie Finley, male    DOB: Sep 05, 1961, 61 y.o.   MRN: [id number]  This visit was conducted in person.  BP 104/60    Pulse 84    Temp 98.2 F (36.8 C) (Oral)    Ht 6' (1.829 m)    Wt (!) 344 lb 2 oz (156.1 kg)    SpO2 94%    BMI 46.67 kg/m    CC:  Chief Complaint  Patient presents with   Diabetes    Subjective:   HPI: Jamie Finley is a 61 y.o. male presenting on 11/15/2021 for Diabetes  Diabetes: Well-controlled on glipizide, metformin and Actos Lab Results  Component Value Date   HGBA1C 6.7 (H) 11/09/2021  Using medications without difficulties: Hypoglycemic episodes: none Hyperglycemic episodes: none Feet problems: Blood Sugars averaging: 130-140 eye exam within last year: yes  Body mass index is 46.67 kg/m.  Hypertension: Well-controlled on HCTZ, lisinopril 2.5 mg daily ( lower dose. BP Readings from Last 3 Encounters:  11/15/21 104/60  08/09/21 100/62  07/19/21 108/62  Using medication without problems or lightheadedness: None  Chest pain with exertion: none Edema:none Short of breath:none Average home BPs: Other issues:   Has  noting more fumbling in hands , losing grip on items. Dropping things... occurring in last several years, worsening in last 6-12 months. No neck pain, no arm pain.  Some shooting in left leg       Relevant past medical, surgical, family and social history reviewed and updated as indicated. Interim medical history since our last visit reviewed. Allergies and medications reviewed and updated. Outpatient Medications Prior to Visit  Medication Sig Dispense Refill   Accu-Chek Softclix Lancets lancets Use to check fasting blood sugar daily 100 each 3   acetaminophen (TYLENOL) 325 MG tablet Take 325 mg by mouth every 4 (four) hours as needed.      aspirin 81 MG tablet Take 81 mg by mouth daily.     blood glucose meter kit and supplies Dispense based on patient and insurance preference. Use to check blood sugar two times  a day. 1 each 0   Blood Glucose Monitoring Suppl (ACCU-CHEK GUIDE) w/Device KIT Use to check blood fasting blood sugar daily 1 kit 0   Cholecalciferol (VITAMIN D3) 50 MCG (2000 UT) TABS Take 5-7 tablets by mouth daily.     colchicine 0.6 MG tablet Take 1 tablet (0.6 mg total) by mouth daily as needed. 30 tablet 1   cyclobenzaprine (FLEXERIL) 10 MG tablet Take 1 tablet (10 mg total) by mouth 3 (three) times daily as needed for muscle spasms. 30 tablet 0   gabapentin (NEURONTIN) 400 MG capsule Take 1 capsule (400 mg total) by mouth daily as needed. (Patient taking differently: Take 400 mg by mouth 2 (two) times daily.) 90 capsule 3   glipiZIDE (GLUCOTROL XL) 10 MG 24 hr tablet Take 10 mg by mouth daily with breakfast.     glucose blood (ACCU-CHEK GUIDE) test strip Use to check blood fasting blood sugar daily 100 each 3   hydrochlorothiazide (HYDRODIURIL) 25 MG tablet TAKE 1 TABLET BY MOUTH ONCE DAILY AS NEEDED (Patient taking differently: Take 25 mg by mouth daily.) 90 tablet 1   lisinopril (ZESTRIL) 2.5 MG tablet Take 2 tablets (5 mg total) by mouth daily. (Patient taking differently: Take 2.5 mg by mouth daily.) 30 tablet 11   metFORMIN (GLUCOPHAGE-XR) 500 MG 24 hr tablet Take 1 tablet by mouth twice daily (Patient taking differently: 1,000 mg in  the morning and at bedtime.) 180 tablet 1   methocarbamol (ROBAXIN) 750 MG tablet Take 1 tablet (750 mg total) by mouth daily as needed for muscle spasms. 30 tablet 1   Multiple Vitamins-Minerals (ZINC PO) Take 1 tablet by mouth daily.     pioglitazone (ACTOS) 30 MG tablet Take 1 tablet (30 mg total) by mouth daily. 30 tablet 11   polyethylene glycol (MIRALAX / GLYCOLAX) packet Take 17 g by mouth daily. (Patient taking differently: Take 17 g by mouth daily as needed.) 14 each 0   rosuvastatin (CRESTOR) 20 MG tablet TAKE 1 TABLET BY MOUTH AT BEDTIME 90 tablet 3   No facility-administered medications prior to visit.     Per HPI unless specifically  indicated in ROS section below Review of Systems Objective:  BP 104/60    Pulse 84    Temp 98.2 F (36.8 C) (Oral)    Ht 6' (1.829 m)    Wt (!) 344 lb 2 oz (156.1 kg)    SpO2 94%    BMI 46.67 kg/m   Wt Readings from Last 3 Encounters:  11/15/21 (!) 344 lb 2 oz (156.1 kg)  08/09/21 (!) 374 lb (169.6 kg)  07/19/21 (!) 334 lb 2 oz (151.6 kg)      Physical Exam    Results for orders placed or performed in visit on 11/09/21  PSA, Medicare  Result Value Ref Range   PSA 0.24 0.10 - 4.00 ng/ml  Hemoglobin A1c  Result Value Ref Range   Hgb A1c MFr Bld 6.7 (H) 4.6 - 6.5 %    This visit occurred during the SARS-CoV-2 public health emergency.  Safety protocols were in place, including screening questions prior to the visit, additional usage of staff PPE, and extensive cleaning of exam room while observing appropriate contact time as indicated for disinfecting solutions.   COVID 19 screen:  No recent travel or known exposure to COVID19 The patient denies respiratory symptoms of COVID 19 at this time. The importance of social distancing was discussed today.   Assessment and Plan     [name], MD

## 2021-11-15 NOTE — Patient Instructions (Addendum)
Continue current regimen. ? Keep working  on healthy lifestyle changes. ? Call if interested in mood medication, continue counseling. ? Call if hand symptoms worsening. ?

## 2021-11-15 NOTE — Assessment & Plan Note (Addendum)
Stable, chronic.   He states he has been taking metformin 500 mg XL TWO TWICE daily ( our med list is not correct, and this is doing well to control his DM), as well as Actos 10 mg Xl daily and Actos 30 mg daily ?

## 2021-11-29 ENCOUNTER — Ambulatory Visit (INDEPENDENT_AMBULATORY_CARE_PROVIDER_SITE_OTHER): Payer: Medicare PPO | Admitting: Psychology

## 2021-11-29 ENCOUNTER — Other Ambulatory Visit: Payer: Self-pay

## 2021-11-29 DIAGNOSIS — F4323 Adjustment disorder with mixed anxiety and depressed mood: Secondary | ICD-10-CM | POA: Diagnosis not present

## 2021-11-29 NOTE — Progress Notes (Signed)
[facility] Counselor/Therapist Progress Note ? ?Patient ID: Jamie Finley, MRN: [id number]   ? ?Date: 11/29/21 ? ?Time Spent: 11:08  pm - 12:00 pm : 52 Minutes ? ?Treatment Type: Individual Therapy. ? ?Reported Symptoms: Anxiety, interpersonal stressors.  ? ?Mental Status Exam: ?Appearance:  Fairly Groomed     ?Behavior: Appropriate  ?Motor: Normal  ?Speech/Language:  Clear and Coherent  ?Affect: Appropriate  ?Mood: anxious and depressed  ?Thought process: normal  ?Thought content:   WNL  ?Sensory/Perceptual disturbances:   WNL  ?Orientation: oriented to person, place, and time/date  ?Attention: Good  ?Concentration: Good  ?Memory: WNL  ?Fund of knowledge:  Good  ?Insight:   Good  ?Judgment:  Good  ?Impulse Control: Good  ? ?Risk Assessment: ?Danger to Self:  No ?Self-injurious Behavior: No ?Danger to Others: No ?Duty to Warn:no ?Physical Aggression / Violence:No  ?Access to Firearms a concern: No  ?Gang Involvement:No  ? ?Subjective:  ? ?[name] participated in the session, in person in the office with the therapist, and consented to treatment. Lovett reviewed the events of the past week. Asaad noted having an unenjoyable birthday due to a confluence of events including having to attend a funeral, not having someone to celebrate with, and his favorite band canceling a concert. Syon noted feeling forgotten and this negatively affecting his mood, resulting in increased depressive symptoms and feelings. He discussed his hope to have met with friend and enjoyed the concert together. We explored his feelings and ways to meet his own needs via planning and communication, including problem solving. Therapist modeled problem-solving, during the session, and encouraged Erle to engage in problem-solving, proactively, and address barriers more consistently.  We worked on identifying positives during the session and Tayvon was able to identify numerous positives including reconnecting  with a friend, and being motivated to work through some of the barriers that he previously endorsed.  Therapist praised Odas for his effort and the session including his cognitive flexibility.  Karim was engaged and motivated during the session and expressed commitment towards the goals.  Therapist validated Quatavious's experience and feelings and provided supportive therapy.  A follow-up was scheduled for continued treatment.  We will continue to explore his resentment towards having to make accommodations for his wheelchair and lack of mobility. ? ?Interventions:  Behavioral Activation & Problem-solving ? ?Diagnosis:   ?Adjustment disorder with mixed anxiety and depressed mood ? ?Treatment Plan: ? ?Client Abilities/Strengths ?Joselito is i intelligent, forthcoming, and motivated for change. ? ?Support System: ?Friends and church members. ? ?Client Treatment Preferences ?Outpatient therapy ? ?Client Statement of Needs ?Seichi discussed his goals for treatment including improving his self-esteem, improving his overall outlook, managing his overall symptoms, becoming more self-accepting, identify source for mood changes, and improve overall functioning. ? ?Treatment Level ?Weekly ? ?Symptoms ? ?Depression: Feeling down, loss of interest, insomnia (medical/health), lethargy, overeating, difficulty concentrating, feeling bad about self.   (Status: maintained) ?Anxiety: feeling anxious, difficulty managing worry (not being married again, dying alone, trouble relaxing, restlessness, irritability, feeling afraid that something awful might happen.    (Status: maintained) ? ?Goals:  ? ?Abdulwahab experiences symptoms of depression and anxiety/ ? ? ?Target Date: 08-02-22 Frequency: Weekly  ?Progress: 0 Modality: individual  ? ? ?Therapist will provide referrals for additional resources as appropriate.  ?Therapist will provide psycho-education regarding Cristofer's diagnosis and corresponding treatment approaches and  interventions. ?Licensed Clinical Social Worker, Buena Irish, LCSW will support the patient's ability to achieve the goals identified. will employ CBT,  BA, Problem-solving, Solution Focused, Mindfulness,  coping skills, & other evidenced-based practices will be used to promote progress towards healthy functioning to help manage decrease symptoms associated with his diagnosis.  ? Reduce overall level, frequency, and intensity of the feelings of depression & anxiety as evidenced by decreased overall symptoms from 6 to 7 days/week to 0 to 1 days/week per client report for at least 3 consecutive months. ?Verbally express understanding of the relationship between feelings of depression, anxiety and their impact on thinking patterns and behaviors. ?Verbalize an understanding of the role that distorted thinking plays in creating fears, excessive worry, and ruminations. ?Increase engagement in self-care including consistent exercise. ? ?(Nichalos participated in the creation of the treatment plan) ? ? ?Buena Irish, LCSW ? ? ? ? ? ? ?Buena Irish, LCSW ?

## 2021-12-09 NOTE — Assessment & Plan Note (Signed)
Chronic, well controlled ? ?Continue lisinopril 2.5 mg p.o. daily and HCTZ 25 mg daily ?

## 2021-12-09 NOTE — Assessment & Plan Note (Signed)
Chronic, worsening control ? ?We discussed consideration of medication at this time and he is not interested currently.  He will let me know if this changes. ?

## 2021-12-12 ENCOUNTER — Other Ambulatory Visit: Payer: Self-pay | Admitting: Family Medicine

## 2021-12-27 ENCOUNTER — Ambulatory Visit (INDEPENDENT_AMBULATORY_CARE_PROVIDER_SITE_OTHER): Payer: Medicare PPO | Admitting: Psychology

## 2021-12-27 DIAGNOSIS — F4323 Adjustment disorder with mixed anxiety and depressed mood: Secondary | ICD-10-CM | POA: Diagnosis not present

## 2021-12-27 NOTE — Progress Notes (Signed)
[place] Counselor/Therapist Progress Note ? ?Patient ID: Jamie Finley, MRN: [id number]   ? ?Date: 12/27/21 ? ?Time Spent: 10:04  am - 10:53 am : 49 Minutes ? ?Treatment Type: Individual Therapy. ? ?Reported Symptoms: Anxiety, interpersonal stressors.  ? ?Mental Status Exam: ?Appearance:  Fairly Groomed     ?Behavior: Appropriate  ?Motor: Normal  ?Speech/Language:  Clear and Coherent  ?Affect: Appropriate  ?Mood: depressed  ?Thought process: normal  ?Thought content:   WNL  ?Sensory/Perceptual disturbances:   WNL  ?Orientation: oriented to person, place, and time/date  ?Attention: Good  ?Concentration: Good  ?Memory: WNL  ?Fund of knowledge:  Good  ?Insight:   Good  ?Judgment:  Good  ?Impulse Control: Good  ? ?Risk Assessment: ?Danger to Self:  No ?Self-injurious Behavior: No ?Danger to Others: No ?Duty to Warn:no ?Physical Aggression / Violence:No  ?Access to Firearms a concern: No  ?Gang Involvement:No  ? ?Subjective:  ? ?Jamie Finley participated in the session, in person in the office with the therapist, and consented to treatment. Jamie Finley reviewed the events of the past week. Jamie Finley noted an improvement in his mood due to beginning to plant in constrainers which includes spending time outside. He noted increased positive thinking, as a result. He noted his avoidence to purchase an additional grabber due to his poor mobility as this would acknowledge his disability. He noted this occurring in the past with a delay of getting his handicap parking sticker. We worked on building flexibility to his dichotomous thinking in this area. Therapist provided psycho-education regarding cognitive distortions and management of. Jamie Finley  was encouraged to  contact his PCP regarding occupational therapy and appointments with a registered dietician. We worked on identification of protocol for when to ask for help and when to challenge self. Therapist validated Jamie Finley's experience and feelings and  provided supportive therapy.  A follow-up was scheduled for continued treatment.  ? ?Interventions: Cognitive Behavioral Therapy ? ?Diagnosis:   ?Adjustment disorder with mixed anxiety and depressed mood ? ?Treatment Plan: ? ?Client Abilities/Strengths ?Jamie Finley is i intelligent, forthcoming, and motivated for change. ? ?Support System: ?Friends and church members. ? ?Client Treatment Preferences ?Outpatient therapy ? ?Client Statement of Needs ?Jamie Finley discussed his goals for treatment including improving his self-esteem, improving his overall outlook, managing his overall symptoms, becoming more self-accepting, identify source for mood changes, and improve overall functioning. ? ?Treatment Level ?Weekly ? ?Symptoms ? ?Depression: Feeling down, loss of interest, insomnia (medical/health), lethargy, overeating, difficulty concentrating, feeling bad about self.   (Status: maintained) ?Anxiety: feeling anxious, difficulty managing worry (not being married again, dying alone, trouble relaxing, restlessness, irritability, feeling afraid that something awful might happen.    (Status: maintained) ? ?Goals:  ? ?Jamie Finley experiences symptoms of depression and anxiety/ ? ? ?Target Date: 08-02-22 Frequency: Weekly  ?Progress: 0 Modality: individual  ? ? ?Therapist will provide referrals for additional resources as appropriate.  ?Therapist will provide psycho-education regarding Jamie Finley's diagnosis and corresponding treatment approaches and interventions. ?Licensed Clinical Social Worker, Buena Irish, LCSW will support the patient's ability to achieve the goals identified. will employ CBT, BA, Problem-solving, Solution Focused, Mindfulness,  coping skills, & other evidenced-based practices will be used to promote progress towards healthy functioning to help manage decrease symptoms associated with his diagnosis.  ? Reduce overall level, frequency, and intensity of the feelings of depression & anxiety as evidenced by  decreased overall symptoms from 6 to 7 days/week to 0 to 1 days/week per client report for at least 3 consecutive  months. ?Verbally express understanding of the relationship between feelings of depression, anxiety and their impact on thinking patterns and behaviors. ?Verbalize an understanding of the role that distorted thinking plays in creating fears, excessive worry, and ruminations. ?Increase engagement in self-care including consistent exercise. ? ?(Jamie Finley participated in the creation of the treatment plan) ? ? ?Buena Irish, LCSW ?

## 2022-01-12 ENCOUNTER — Other Ambulatory Visit: Payer: Self-pay | Admitting: Family Medicine

## 2022-01-23 ENCOUNTER — Encounter: Payer: Self-pay | Admitting: Family Medicine

## 2022-01-23 MED ORDER — SILDENAFIL CITRATE 20 MG PO TABS: ORAL_TABLET | ORAL | 11 refills | Status: DC

## 2022-01-24 ENCOUNTER — Ambulatory Visit (INDEPENDENT_AMBULATORY_CARE_PROVIDER_SITE_OTHER): Payer: Medicare PPO | Admitting: Psychology

## 2022-01-24 DIAGNOSIS — F4323 Adjustment disorder with mixed anxiety and depressed mood: Secondary | ICD-10-CM | POA: Diagnosis not present

## 2022-01-24 NOTE — Progress Notes (Signed)
[place] Counselor/Therapist Progress Note ? ?Patient ID: Jamie Finley, MRN: [id number]   ? ?Date: 01/24/22 ? ?Time Spent: 3:03 am - 3:49 am : 46 Minutes ? ?Treatment Type: Individual Therapy. ? ?Reported Symptoms: Anxiety, interpersonal stressors.  ? ?Mental Status Exam: ?Appearance:  Fairly Groomed     ?Behavior: Appropriate  ?Motor: Normal  ?Speech/Language:  Clear and Coherent  ?Affect: Appropriate  ?Mood: depressed  ?Thought process: normal  ?Thought content:   WNL  ?Sensory/Perceptual disturbances:   WNL  ?Orientation: oriented to person, place, and time/date  ?Attention: Good  ?Concentration: Good  ?Memory: WNL  ?Fund of knowledge:  Good  ?Insight:   Good  ?Judgment:  Good  ?Impulse Control: Good  ? ?Risk Assessment: ?Danger to Self:  No ?Self-injurious Behavior: No ?Danger to Others: No ?Duty to Warn:no ?Physical Aggression / Violence:No  ?Access to Firearms a concern: No  ?Gang Involvement:No  ? ?Subjective:  ? ?Jamie Finley participated in the session, in person in the office with the therapist, and consented to treatment. Jamie Finley reviewed the events of the past week. Jamie Finley noted some improvement in his mood and noted occupying his time more consistently. He continues to garden at his home. He noted being more open to accepting help from others but noted feeling "emasculated" as a result. He noted feeling productive and having positive feelings as a result. He endorsed continued poor sleep and noted napping more often, as a result. Jamie Finley suspects that he has sleep apnea and was encouraged to contact his PCP to discussed the viability of a sleep study. Therapist reinforced Jamie Finley's positive behavioral changes and discussed the importance of creating a rhythm in his day-to-day and the importance of this regarding mood and mood management. Therapist validated Jamie Finley's experience and feelings and provided supportive therapy.  A follow-up was scheduled for continued treatment.   ? ?Interventions: Cognitive Behavioral Therapy ? ?Diagnosis:   ?Adjustment disorder with mixed anxiety and depressed mood ? ?Treatment Plan: ? ?Client Abilities/Strengths ?Jamie Finley is i intelligent, forthcoming, and motivated for change. ? ?Support System: ?Friends and church members. ? ?Client Treatment Preferences ?Outpatient therapy ? ?Client Statement of Needs ?Jamie Finley discussed his goals for treatment including improving his self-esteem, improving his overall outlook, managing his overall symptoms, becoming more self-accepting, identify source for mood changes, and improve overall functioning. ? ?Treatment Level ?Weekly ? ?Symptoms ? ?Depression: Feeling down, loss of interest, insomnia (medical/health), lethargy, overeating, difficulty concentrating, feeling bad about self.   (Status: improved) ?Anxiety: feeling anxious, difficulty managing worry (not being married again, dying alone, trouble relaxing, restlessness, irritability, feeling afraid that something awful might happen.    (Status: maintained) ? ?Goals:  ? ?Jamie Finley experiences symptoms of depression and anxiety/ ? ? ?Target Date: 08-02-22 Frequency: Weekly  ?Progress: 0 Modality: individual  ? ? ?Therapist will provide referrals for additional resources as appropriate.  ?Therapist will provide psycho-education regarding Jamie Finley's diagnosis and corresponding treatment approaches and interventions. ?Licensed Clinical Social Worker, Buena Irish, LCSW will support the patient's ability to achieve the goals identified. will employ CBT, BA, Problem-solving, Solution Focused, Mindfulness,  coping skills, & other evidenced-based practices will be used to promote progress towards healthy functioning to help manage decrease symptoms associated with his diagnosis.  ? Reduce overall level, frequency, and intensity of the feelings of depression & anxiety as evidenced by decreased overall symptoms from 6 to 7 days/week to 0 to 1 days/week per client report  for at least 3 consecutive months. ?Verbally express understanding of the relationship between feelings  of depression, anxiety and their impact on thinking patterns and behaviors. ?Verbalize an understanding of the role that distorted thinking plays in creating fears, excessive worry, and ruminations. ?Increase engagement in self-care including consistent exercise. ? ?(Jamie Finley participated in the creation of the treatment plan) ? ? ?Buena Irish, LCSW ?

## 2022-01-25 NOTE — Telephone Encounter (Signed)
FYI:  Medicare does not cover any medications for ED.  Patient will need to pay out of pocket.  ?

## 2022-02-02 ENCOUNTER — Other Ambulatory Visit: Payer: Self-pay | Admitting: Family Medicine

## 2022-02-10 ENCOUNTER — Other Ambulatory Visit: Payer: Self-pay | Admitting: Family Medicine

## 2022-02-10 DIAGNOSIS — E785 Hyperlipidemia, unspecified: Secondary | ICD-10-CM

## 2022-02-11 ENCOUNTER — Emergency Department: Payer: Medicare PPO

## 2022-02-11 ENCOUNTER — Emergency Department
Admission: EM | Admit: 2022-02-11 | Discharge: 2022-02-12 | Disposition: A | Discharge status: Home / Self Care | Payer: Medicare PPO | Attending: Emergency Medicine | Admitting: Emergency Medicine

## 2022-02-11 ENCOUNTER — Other Ambulatory Visit: Payer: Self-pay

## 2022-02-11 ENCOUNTER — Encounter: Payer: Self-pay | Admitting: Emergency Medicine

## 2022-02-11 DIAGNOSIS — R531 Weakness: Secondary | ICD-10-CM | POA: Diagnosis not present

## 2022-02-11 DIAGNOSIS — B349 Viral infection, unspecified: Secondary | ICD-10-CM

## 2022-02-11 DIAGNOSIS — Z20822 Contact with and (suspected) exposure to covid-19: Secondary | ICD-10-CM | POA: Insufficient documentation

## 2022-02-11 DIAGNOSIS — I10 Essential (primary) hypertension: Secondary | ICD-10-CM | POA: Diagnosis not present

## 2022-02-11 DIAGNOSIS — R110 Nausea: Secondary | ICD-10-CM

## 2022-02-11 DIAGNOSIS — R509 Fever, unspecified: Secondary | ICD-10-CM

## 2022-02-11 DIAGNOSIS — E119 Type 2 diabetes mellitus without complications: Secondary | ICD-10-CM | POA: Insufficient documentation

## 2022-02-11 DIAGNOSIS — R059 Cough, unspecified: Secondary | ICD-10-CM | POA: Diagnosis not present

## 2022-02-11 DIAGNOSIS — E162 Hypoglycemia, unspecified: Secondary | ICD-10-CM | POA: Diagnosis not present

## 2022-02-11 LAB — URINALYSIS, COMPLETE (UACMP) WITH MICROSCOPIC
Bilirubin Urine: NEGATIVE
Glucose, UA: NEGATIVE mg/dL
Hgb urine dipstick: NEGATIVE
Ketones, ur: NEGATIVE mg/dL
Leukocytes,Ua: NEGATIVE
Nitrite: NEGATIVE
Protein, ur: NEGATIVE mg/dL
Specific Gravity, Urine: 1.017 (ref 1.005–1.030)
Squamous Epithelial / HPF: NONE SEEN (ref 0–5)
pH: 5 (ref 5.0–8.0)

## 2022-02-11 LAB — CBC
HCT: 41.8 % (ref 39.0–52.0)
Hemoglobin: 13.3 g/dL (ref 13.0–17.0)
MCH: 27.2 pg (ref 26.0–34.0)
MCHC: 31.8 g/dL (ref 30.0–36.0)
MCV: 85.5 fL (ref 80.0–100.0)
Platelets: 258 10*3/uL (ref 150–400)
RBC: 4.89 MIL/uL (ref 4.22–5.81)
RDW: 14.7 % (ref 11.5–15.5)
WBC: 8.5 10*3/uL (ref 4.0–10.5)
nRBC: 0 % (ref 0.0–0.2)

## 2022-02-11 LAB — BASIC METABOLIC PANEL
Anion gap: 10 (ref 5–15)
BUN: 25 mg/dL — ABNORMAL HIGH (ref 8–23)
CO2: 28 mmol/L (ref 22–32)
Calcium: 9.6 mg/dL (ref 8.9–10.3)
Chloride: 99 mmol/L (ref 98–111)
Creatinine, Ser: 1.26 mg/dL — ABNORMAL HIGH (ref 0.61–1.24)
GFR, Estimated: >60 mL/min (ref >60)
Glucose, Bld: 85 mg/dL (ref 70–99)
Potassium: 3.5 mmol/L (ref 3.5–5.1)
Sodium: 137 mmol/L (ref 135–145)

## 2022-02-11 LAB — CBG MONITORING, ED
Glucose-Capillary: 79 mg/dL (ref 70–99)
Glucose-Capillary: 168 mg/dL — ABNORMAL HIGH (ref 70–99)

## 2022-02-11 LAB — TROPONIN I (HIGH SENSITIVITY): Troponin I (High Sensitivity): 5 ng/L (ref <18)

## 2022-02-11 MED ORDER — ONDANSETRON 4 MG PO TBDP
4.0000 mg | ORAL_TABLET | Freq: Once | ORAL | Status: AC
  Administered 2022-02-11: 4 mg via ORAL
  Filled 2022-02-11: qty 1

## 2022-02-11 NOTE — ED Notes (Signed)
Provided pt with some grape juice

## 2022-02-11 NOTE — ED Notes (Signed)
Pt stating that at 3pm they laid down to rest when the developed a sudden tremor/tingling over entire body "inside my skin, internally" Pt stating it has since resolved for the most part. Pt stating they "my blood sugar was low when I got here, but they gave me juice and I had a poptard and feel better"  Pt give urine cup for UA collection

## 2022-02-11 NOTE — ED Triage Notes (Signed)
Pt reports he has not been feeling well all day, reports feeling short of breath, weak, tingling sensation on his skin. Pt denies coughing, chest pain. Pt talks in complete sentences no respiratory distress noted

## 2022-02-12 DIAGNOSIS — B349 Viral infection, unspecified: Secondary | ICD-10-CM | POA: Diagnosis not present

## 2022-02-12 LAB — RESP PANEL BY RT-PCR (FLU A&B, COVID) ARPGX2
Influenza A by PCR: NEGATIVE
Influenza B by PCR: NEGATIVE
SARS Coronavirus 2 by RT PCR: NEGATIVE

## 2022-02-12 LAB — CBG MONITORING, ED: Glucose-Capillary: 136 mg/dL — ABNORMAL HIGH (ref 70–99)

## 2022-02-12 MED ORDER — LACTATED RINGERS IV BOLUS
1000.0000 mL | Freq: Once | INTRAVENOUS | Status: AC
  Administered 2022-02-12: 1000 mL via INTRAVENOUS

## 2022-02-12 MED ORDER — ACETAMINOPHEN 500 MG PO TABS
1000.0000 mg | ORAL_TABLET | Freq: Once | ORAL | Status: DC
  Filled 2022-02-12: qty 2

## 2022-02-12 NOTE — ED Provider Notes (Signed)
[facility] Provider Note    Event Date/Time   First MD Initiated Contact with Patient 02/11/22 2255     (approximate)   History   Weakness   HPI  Jamie Finley is a 61 y.o. male with a history of diabetes, hypertension, hyperlipidemia who presents for evaluation of generalized weakness.  Patient reports that he has been feeling unwell since yesterday evening with body aches, feeling jittery, and nausea.  He denies chest pain or shortness of breath, cough or congestion, fever, dysuria or hematuria, abdominal pain, vomiting or diarrhea.  No known sick contact exposures.  Has had decreased oral intake because is not feeling well.     Past Medical History:  Diagnosis Date   Arthritis    Depression    Diabetes mellitus without complication ([place])    Frequent headaches    Gout    Hyperlipidemia    Hypertension    Stroke [facility])     Past Surgical History:  Procedure Laterality Date   BACK SURGERY  2007   [facility]   COLONOSCOPY WITH PROPOFOL N/A 01/27/2020   Procedure: COLONOSCOPY WITH PROPOFOL;  Surgeon: Jonathon Bellows, MD;  Location: [facility] ENDOSCOPY;  Service: Gastroenterology;  Laterality: N/A;   COLONOSCOPY WITH PROPOFOL N/A 01/28/2020   Procedure: COLONOSCOPY WITH PROPOFOL;  Surgeon: Jonathon Bellows, MD;  Location: [facility] ENDOSCOPY;  Service: Gastroenterology;  Laterality: N/A;   TONSILLECTOMY       Physical Exam   Triage Vital Signs: ED Triage Vitals  Enc Vitals Group     BP 02/11/22 1951 (!) 110/59     Pulse Rate 02/11/22 1951 81     Resp 02/11/22 1951 16     Temp 02/11/22 1951 98 F (36.7 C)     Temp Source 02/11/22 1951 Oral     SpO2 02/11/22 1951 97 %     Weight 02/11/22 1952 (!) 330 lb (149.7 kg)     Height 02/11/22 1952 '6\' 2"'$  (1.88 m)     Head Circumference --      Peak Flow --      Pain Score 02/11/22 1951 0     Pain Loc --      Pain Edu? --      Excl. in [place]? --     Most recent vital signs: Vitals:   02/12/22 0152 02/12/22 0306  BP:  97/70 103/61  Pulse: 72 64  Resp: 16 16  Temp:    SpO2: 98% 99%     Constitutional: Alert and oriented. Well appearing and in no apparent distress. HEENT:      Head: Normocephalic and atraumatic.         Eyes: Conjunctivae are normal. Sclera is non-icteric.       Mouth/Throat: Mucous membranes are moist.       Neck: Supple with no signs of meningismus. Cardiovascular: Regular rate and rhythm. No murmurs, gallops, or rubs. 2+ symmetrical distal pulses are present in all extremities.  Respiratory: Normal respiratory effort. Lungs are clear to auscultation bilaterally.  Gastrointestinal: Soft, non tender, and non distended with positive bowel sounds. No rebound or guarding. Genitourinary: No CVA tenderness. Musculoskeletal:  No edema, cyanosis, or erythema of extremities. Neurologic: Normal speech and language. Face is symmetric. Moving all extremities. No gross focal neurologic deficits are appreciated. Skin: Skin is warm, dry and intact. No rash noted. Psychiatric: Mood and affect are normal. Speech and behavior are normal.  ED Results / Procedures / Treatments   Labs (all labs ordered are  listed, but only abnormal results are displayed) Labs Reviewed  BASIC METABOLIC PANEL - Abnormal; Notable for the following components:      Result Value   BUN 25 (*)    Creatinine, Ser 1.26 (*)    All other components within normal limits  URINALYSIS, COMPLETE (UACMP) WITH MICROSCOPIC - Abnormal; Notable for the following components:   Color, Urine YELLOW (*)    APPearance CLEAR (*)    Bacteria, UA RARE (*)    All other components within normal limits  CBG MONITORING, ED - Abnormal; Notable for the following components:   Glucose-Capillary 168 (*)    All other components within normal limits  CBG MONITORING, ED - Abnormal; Notable for the following components:   Glucose-Capillary 136 (*)    All other components within normal limits  RESP PANEL BY RT-PCR (FLU A&B, COVID) ARPGX2  CBC   URINALYSIS, ROUTINE W REFLEX MICROSCOPIC  CBG MONITORING, ED  TROPONIN I (HIGH SENSITIVITY)     EKG  ED ECG REPORT I, Rudene Re, the attending physician, personally viewed and interpreted this ECG.  Sinus rhythm with a rate of 76, normal intervals, left axis deviation, low voltage QRS, unchanged when compared to prior  RADIOLOGY I, Rudene Re, attending MD, have personally viewed and interpreted the images obtained during this visit as below:  Chest x-ray negative for pneumonia   ___________________________________________________ Interpretation by Radiologist:  DG Chest Portable 1 View  Result Date: 02/11/2022 CLINICAL DATA:  Low blood sugar with weakness and cough. EXAM: PORTABLE CHEST 1 VIEW COMPARISON:  February 11, 2019 FINDINGS: The heart size and mediastinal contours are within normal limits. Both lungs are clear. The visualized skeletal structures are unremarkable. IMPRESSION: No active disease. Electronically Signed   By: Virgina Norfolk M.D.   On: 02/11/2022 23:42       PROCEDURES:  Critical Care performed: No  Procedures    IMPRESSION / MDM / ASSESSMENT AND PLAN / ED COURSE  I reviewed the triage vital signs and the nursing notes.  61 y.o. male with a history of diabetes, hypertension, hyperlipidemia who presents for evaluation of generalized weakness.  On exam patient is well-appearing in no distress, has a low-grade temp of 20F, no tachycardia, no tachypnea, no hypoxia, lungs are clear to auscultation, abdomen is soft, no signs of skin infection.  Ddx: Viral syndrome such as COVID or flu, pneumonia, UTI, sepsis, myocarditis   Plan: EKG, troponin, CBC, BMP, COVID and flu swab, urinalysis, chest x-ray.  We will give Zofran for nausea   MEDICATIONS GIVEN IN ED: Medications  acetaminophen (TYLENOL) tablet 1,000 mg (0 mg Oral Hold 02/12/22 0151)  ondansetron (ZOFRAN-ODT) disintegrating tablet 4 mg (4 mg Oral Given 02/11/22 2001)  lactated ringers  bolus 1,000 mL (0 mLs Intravenous Stopped 02/12/22 0306)     ED COURSE: EKG and troponin with no signs of myocarditis or pericarditis.  Since symptoms have been ongoing for greater than 24 hours no need for repeat troponin.  COVID and flu negative.  UA negative for UTI.  Metabolic panel with no significant abnormalities, no signs of sepsis, no leukocytosis, no anemia.  Chest x-ray negative for pneumonia.  Patient was given fluids and Zofran and feels markedly improved.  Also given Tylenol for low-grade fever.  Presentation most likely consistent with a viral syndrome.  Discussed supportive care follow-up with primary care doctor.  Discussed my standard return precautions.   Consults: None   EMR reviewed including records from his last visit with his primary  care doctor from 3 months ago for diabetes    FINAL CLINICAL IMPRESSION(S) / ED DIAGNOSES   Final diagnoses:  Generalized weakness  Nausea  Fever, unspecified fever cause  Viral illness     Rx / DC Orders   ED Discharge Orders     None        Note:  This document was prepared using Dragon voice recognition software and may include unintentional dictation errors.   Please note:  Patient was evaluated in Emergency Department today for the symptoms described in the history of present illness. Patient was evaluated in the context of the global COVID-19 pandemic, which necessitated consideration that the patient might be at risk for infection with the SARS-CoV-2 virus that causes COVID-19. Institutional protocols and algorithms that pertain to the evaluation of patients at risk for COVID-19 are in a state of rapid change based on information released by regulatory bodies including the CDC and federal and state organizations. These policies and algorithms were followed during the patient's care in the ED.  Some ED evaluations and interventions may be delayed as a result of limited staffing during the pandemic.       Alfred Levins,  [place], MD 02/12/22 [phone]

## 2022-02-16 ENCOUNTER — Ambulatory Visit (INDEPENDENT_AMBULATORY_CARE_PROVIDER_SITE_OTHER): Payer: Medicare PPO | Admitting: Family Medicine

## 2022-02-16 ENCOUNTER — Encounter: Payer: Self-pay | Admitting: Family Medicine

## 2022-02-16 VITALS — BP 92/54 | HR 69 | Temp 98.1°F | Resp 16 | Ht 74.0 in | Wt 340.4 lb

## 2022-02-16 DIAGNOSIS — R109 Unspecified abdominal pain: Secondary | ICD-10-CM

## 2022-02-16 DIAGNOSIS — I152 Hypertension secondary to endocrine disorders: Secondary | ICD-10-CM

## 2022-02-16 DIAGNOSIS — E1159 Type 2 diabetes mellitus with other circulatory complications: Secondary | ICD-10-CM

## 2022-02-16 DIAGNOSIS — M545 Low back pain, unspecified: Secondary | ICD-10-CM

## 2022-02-16 DIAGNOSIS — E1143 Type 2 diabetes mellitus with diabetic autonomic (poly)neuropathy: Secondary | ICD-10-CM

## 2022-02-16 DIAGNOSIS — F331 Major depressive disorder, recurrent, moderate: Secondary | ICD-10-CM | POA: Diagnosis not present

## 2022-02-16 LAB — POCT GLYCOSYLATED HEMOGLOBIN (HGB A1C): Hemoglobin A1C: 5.7 % — AB (ref 4.0–5.6)

## 2022-02-16 NOTE — Assessment & Plan Note (Signed)
Chronic, stable at this time. Continue visits with therapist.

## 2022-02-16 NOTE — Patient Instructions (Addendum)
Remain off glipizide.  Continue pioglitazone 30 mg daily.  Continue metformin  500 mg  twice daily.  Stop lisinopril.  Keep up with fluids.  Check blood pressure cuff at home.Marland Kitchen goal > 90/60-140/90.  Start low back stretching.  I will place a nutritionist referral.  Look into continuous blood glucose meter coverage.Aretta Nip LIbre or Dexcom

## 2022-02-16 NOTE — Progress Notes (Signed)
Patient ID: Jamie Finley, male    DOB: 07/09/1961, 61 y.o.   MRN: [id number]  This visit was conducted in person.  BP (!) 92/54   Pulse 69   Temp 98.1 F (36.7 C)   Resp 16   Ht _0  (1.88 m)   Wt (!) 340 lb 6 oz (154.4 kg)   SpO2 98%   BMI 43.70 kg/m    CC:  Chief Complaint  Patient presents with   Diabetes    Subjective:   HPI: Jamie Finley is a 61 y.o. male presenting on 02/16/2022 for Diabetes   ED visit on 02/11/2022 for generalized weakness.. reviewed ED visit notes. EKG and troponin with no signs of myocarditis or pericarditis.  Since symptoms have been ongoing for greater than 24 hours no need for repeat troponin.  COVID and flu negative.  UA negative for UTI.  Metabolic panel with no significant abnormalities, no signs of sepsis, no leukocytosis, no anemia.  Chest x-ray negative for pneumonia.  Patient was given fluids and Zofran and feels markedly improved.  Also given Tylenol for low-grade fever.  Presentation most likely consistent with a viral syndrome. BP and glucose was low at ED.   Diabetes:  A1C 5.7 now on glipizide, metformin and actos Since blood sugars have been low.. he has stopped the glipizide,  on actos 30 mg daily, metformin 1000 mg twice daily. Using medications without difficulties: Hypoglycemic episodes: yes Hyperglycemic episodes: none Feet problems: no ulcers Blood Sugars averaging: 100-114 fasting , post prandial 149 eye exam within last year:   He is still feeling jittery some. Drinking 48-72 oz a day  Has lost 4 lbs in last 3 months Wt Readings from Last 3 Encounters:  02/16/22 (!) 340 lb 6 oz (154.4 kg)  02/11/22 (!) 330 lb (149.7 kg)  11/15/21 (!) 344 lb 2 oz (156.1 kg)     Hypertension:   Well controlled on HCTZ, lisinopril 2.5 mg daily   Using medication without problems or lightheadedness:  none Chest pain with exertion: none Edema: none Short of breath: none Average home BPs: Other issues:  BP Readings from Last 3  Encounters:  02/16/22 (!) 92/54  02/12/22 103/61  11/15/21 104/60     MDD, at last OV had worsening control.. was not interested in medications to treat.  Seeing counselor for adjustment disorder    02/16/2022    2:22 PM 08/09/2021    4:29 PM 07/23/2020    2:12 PM  Depression screen PHQ 2/9  Decreased Interest 2 3 0  Down, Depressed, Hopeless 0 1 0  PHQ - 2 Score 2 4 0  Altered sleeping 3 3 0  Tired, decreased energy 3 2 0  Change in appetite 1 3 0  Feeling bad or failure about yourself  0 3 0  Trouble concentrating 0 2 0  Moving slowly or fidgety/restless 3 0 0  Suicidal thoughts 0 3 0  PHQ-9 Score 12 20 0  Difficult doing work/chores Not difficult at all Very difficult Not difficult at all     Relevant past medical, surgical, family and social history reviewed and updated as indicated. Interim medical history since our last visit reviewed. Allergies and medications reviewed and updated. Outpatient Medications Prior to Visit  Medication Sig Dispense Refill   Accu-Chek Softclix Lancets lancets Use to check fasting blood sugar daily 100 each 3   acetaminophen (TYLENOL) 325 MG tablet Take 325 mg by mouth every 4 (four) hours as needed.  aspirin 81 MG tablet Take 81 mg by mouth daily.     blood glucose meter kit and supplies Dispense based on patient and insurance preference. Use to check blood sugar two times a day. 1 each 0   Blood Glucose Monitoring Suppl (ACCU-CHEK GUIDE) w/Device KIT Use to check blood fasting blood sugar daily 1 kit 0   Cholecalciferol (VITAMIN D3) 50 MCG (2000 UT) TABS Take 5-7 tablets by mouth daily.     colchicine 0.6 MG tablet TAKE 1 TABLET BY MOUTH DAILY AS NEEDED 30 tablet 0   cyclobenzaprine (FLEXERIL) 10 MG tablet Take 1 tablet (10 mg total) by mouth 3 (three) times daily as needed for muscle spasms. 30 tablet 0   gabapentin (NEURONTIN) 400 MG capsule Take 1 capsule (400 mg total) by mouth 2 (two) times daily. 60 capsule 3   glipiZIDE  (GLUCOTROL XL) 10 MG 24 hr tablet Take 1 tablet by mouth once daily with breakfast 90 tablet 1   glucose blood (ACCU-CHEK GUIDE) test strip Use to check blood fasting blood sugar daily 100 each 3   hydrochlorothiazide (HYDRODIURIL) 25 MG tablet Take 1 tablet (25 mg total) by mouth daily. 30 tablet 0   lisinopril (ZESTRIL) 2.5 MG tablet Take 1 tablet (2.5 mg total) by mouth daily. 30 tablet 0   metFORMIN (GLUCOPHAGE-XR) 500 MG 24 hr tablet Take 2 tablets (1,000 mg total) by mouth in the morning and at bedtime. 360 tablet 1   methocarbamol (ROBAXIN) 750 MG tablet Take 1 tablet (750 mg total) by mouth daily as needed for muscle spasms. 30 tablet 1   Multiple Vitamins-Minerals (ZINC PO) Take 1 tablet by mouth daily.     pioglitazone (ACTOS) 30 MG tablet Take 1 tablet by mouth once daily 90 tablet 3   rosuvastatin (CRESTOR) 20 MG tablet TAKE 1 TABLET BY MOUTH AT BEDTIME 90 tablet 0   sildenafil (REVATIO) 20 MG tablet Take 5 tablets prior to sexual activity 20 tablet 11   No facility-administered medications prior to visit.     Per HPI unless specifically indicated in ROS section below Review of Systems  Constitutional:  Negative for fatigue and fever.  HENT:  Negative for ear pain.   Eyes:  Negative for pain.  Respiratory:  Negative for cough and shortness of breath.   Cardiovascular:  Negative for chest pain, palpitations and leg swelling.  Gastrointestinal:  Negative for abdominal pain.  Genitourinary:  Negative for dysuria.  Musculoskeletal:  Negative for arthralgias.  Neurological:  Negative for syncope, light-headedness and headaches.  Psychiatric/Behavioral:  Negative for dysphoric mood.    Objective:  BP (!) 92/54   Pulse 69   Temp 98.1 F (36.7 C)   Resp 16   Ht _0  (1.88 m)   Wt (!) 340 lb 6 oz (154.4 kg)   SpO2 98%   BMI 43.70 kg/m   Wt Readings from Last 3 Encounters:  02/16/22 (!) 340 lb 6 oz (154.4 kg)  02/11/22 (!) 330 lb (149.7 kg)  11/15/21 (!) 344 lb 2 oz (156.1  kg)      Physical Exam Constitutional:      Appearance: He is well-developed. He is obese.     Comments: Wheelchair-bound  HENT:     Head: Normocephalic.     Right Ear: Hearing normal.     Left Ear: Hearing normal.     Nose: Nose normal.  Neck:     Thyroid: No thyroid mass or thyromegaly.     Vascular: No  carotid bruit.     Trachea: Trachea normal.  Cardiovascular:     Rate and Rhythm: Normal rate and regular rhythm.     Pulses: Normal pulses.     Heart sounds: Heart sounds not distant. No murmur heard.    No friction rub. No gallop.     Comments: No peripheral edema Pulmonary:     Effort: Pulmonary effort is normal. No respiratory distress.     Breath sounds: Normal breath sounds.  Skin:    General: Skin is warm and dry.     Findings: No rash.  Neurological:     Sensory: Sensory deficit present.     Motor: Weakness, atrophy and abnormal muscle tone present.     Coordination: Coordination abnormal.  Psychiatric:        Speech: Speech normal.        Behavior: Behavior normal.        Thought Content: Thought content normal.       Results for orders placed or performed during the hospital encounter of 02/11/22  Resp Panel by RT-PCR (Flu A&B, Covid) Anterior Nasal Swab   Specimen: Anterior Nasal Swab  Result Value Ref Range   SARS Coronavirus 2 by RT PCR NEGATIVE NEGATIVE   Influenza A by PCR NEGATIVE NEGATIVE   Influenza B by PCR NEGATIVE NEGATIVE  Basic metabolic panel  Result Value Ref Range   Sodium 137 135 - 145 mmol/L   Potassium 3.5 3.5 - 5.1 mmol/L   Chloride 99 98 - 111 mmol/L   CO2 28 22 - 32 mmol/L   Glucose, Bld 85 70 - 99 mg/dL   BUN 25 (H) 8 - 23 mg/dL   Creatinine, Ser 1.26 (H) 0.61 - 1.24 mg/dL   Calcium 9.6 8.9 - 10.3 mg/dL   GFR, Estimated >60 >60 mL/min   Anion gap 10 5 - 15  CBC  Result Value Ref Range   WBC 8.5 4.0 - 10.5 K/uL   RBC 4.89 4.22 - 5.81 MIL/uL   Hemoglobin 13.3 13.0 - 17.0 g/dL   HCT 41.8 39.0 - 52.0 %   MCV 85.5 80.0 -  100.0 fL   MCH 27.2 26.0 - 34.0 pg   MCHC 31.8 30.0 - 36.0 g/dL   RDW 14.7 11.5 - 15.5 %   Platelets 258 150 - 400 K/uL   nRBC 0.0 0.0 - 0.2 %  Urinalysis, Complete w Microscopic Anterior Nasal Swab  Result Value Ref Range   Color, Urine YELLOW (A) YELLOW   APPearance CLEAR (A) CLEAR   Specific Gravity, Urine 1.017 1.005 - 1.030   pH 5.0 5.0 - 8.0   Glucose, UA NEGATIVE NEGATIVE mg/dL   Hgb urine dipstick NEGATIVE NEGATIVE   Bilirubin Urine NEGATIVE NEGATIVE   Ketones, ur NEGATIVE NEGATIVE mg/dL   Protein, ur NEGATIVE NEGATIVE mg/dL   Nitrite NEGATIVE NEGATIVE   Leukocytes,Ua NEGATIVE NEGATIVE   RBC / HPF 0-5 0 - 5 RBC/hpf   WBC, UA 0-5 0 - 5 WBC/hpf   Bacteria, UA RARE (A) NONE SEEN   Squamous Epithelial / LPF NONE SEEN 0 - 5   Mucus PRESENT   CBG monitoring, ED  Result Value Ref Range   Glucose-Capillary 79 70 - 99 mg/dL  CBG monitoring, ED  Result Value Ref Range   Glucose-Capillary 168 (H) 70 - 99 mg/dL  POC CBG, ED  Result Value Ref Range   Glucose-Capillary 136 (H) 70 - 99 mg/dL  Troponin I (High Sensitivity)  Result Value Ref Range  Troponin I (High Sensitivity) 5 <18 ng/L     COVID 19 screen:  No recent travel or known exposure to COVID19 The patient denies respiratory symptoms of COVID 19 at this time. The importance of social distancing was discussed today.   Assessment and Plan    Problem List Items Addressed This Visit     Hypertension associated with diabetes ([place]) (Chronic)    Chronic, some possible hypotension   He continues to require hydrochlorothiazide given peripheral edema in setting of wheelchair use and hemiaplegia. We will hold the lisinopril low-dose that has been used for kidney protection.  He will start following blood pressure at home.  He will increase his water intake.      Relevant Orders   Ambulatory referral to diabetic education   Abdominal pressure    Acute  He reports intermittent abdominal pressure and some groin pain.   Notes most when he is going from sitting to standing.  He denies dysuria, frequency or urgency changes.  His symptoms are most likely musculoskeletal versus secondary to his chronic issues with constipation.        Acute midline low back pain without sciatica    Acute flare of chronic issue  He continues to have some intermittent low back pain that feels like a pinched disc.  It interferes minimally at this time.  He will start low back stretches which were printed for his review.  If it is progressing we can look into it at the next office visit.      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  A1c today is 5.7.  He has already held glipizide and half this dose of metformin while continuing Actos 30 mg daily.  I agree with this change.  We discussed looking into a continuous blood glucose monitor for insurance coverage.  He will continue trying to be more regular with his eating habits, not skipping meals and following blood sugars closely.  He will follow-up in 2 weeks to make sure hypoglycemia has resolved.      Relevant Orders   POCT glycosylated hemoglobin (Hb A1C) (Completed)   Ambulatory referral to diabetic education   MDD (major depressive disorder), recurrent episode, moderate (HCC)    Chronic, stable at this time. Continue visits with therapist.       Other Visit Diagnoses     Diabetic autonomic neuropathy associated with type 2 diabetes mellitus ([place])            [name], MD

## 2022-02-16 NOTE — Assessment & Plan Note (Signed)
Acute flare of chronic issue  He continues to have some intermittent low back pain that feels like a pinched disc.  It interferes minimally at this time.  He will start low back stretches which were printed for his review.  If it is progressing we can look into it at the next office visit.

## 2022-02-16 NOTE — Assessment & Plan Note (Signed)
Acute  He reports intermittent abdominal pressure and some groin pain.  Notes most when he is going from sitting to standing.  He denies dysuria, frequency or urgency changes.  His symptoms are most likely musculoskeletal versus secondary to his chronic issues with constipation.

## 2022-02-16 NOTE — Assessment & Plan Note (Signed)
Chronic, likely overtreated  Recent low blood sugars, jitteriness and weakness may potentially be related to low blood sugars.  A1c today is 5.7.  He has already held glipizide and half this dose of metformin while continuing Actos 30 mg daily.  I agree with this change.  We discussed looking into a continuous blood glucose monitor for insurance coverage.  He will continue trying to be more regular with his eating habits, not skipping meals and following blood sugars closely.  He will follow-up in 2 weeks to make sure hypoglycemia has resolved.

## 2022-02-16 NOTE — Assessment & Plan Note (Signed)
Chronic, some possible hypotension   He continues to require hydrochlorothiazide given peripheral edema in setting of wheelchair use and hemiaplegia. We will hold the lisinopril low-dose that has been used for kidney protection.  He will start following blood pressure at home.  He will increase his water intake.

## 2022-02-18 DIAGNOSIS — G834 Cauda equina syndrome: Secondary | ICD-10-CM | POA: Diagnosis not present

## 2022-02-18 DIAGNOSIS — Z8673 Personal history of transient ischemic attack (TIA), and cerebral infarction without residual deficits: Secondary | ICD-10-CM | POA: Diagnosis not present

## 2022-02-21 ENCOUNTER — Ambulatory Visit (INDEPENDENT_AMBULATORY_CARE_PROVIDER_SITE_OTHER): Payer: Medicare PPO | Admitting: Psychology

## 2022-02-21 DIAGNOSIS — F4323 Adjustment disorder with mixed anxiety and depressed mood: Secondary | ICD-10-CM

## 2022-02-21 NOTE — Progress Notes (Signed)
[facility] Counselor/Therapist Progress Note  Patient ID: Jamie Finley, MRN: [id number]    Date: 02/21/22  Time Spent: 11:03 am - 11:47 am : 44 Minutes  Treatment Type: Individual Therapy.  Reported Symptoms: Anxiety, interpersonal stressors.   Mental Status Exam: Appearance:  Fairly Groomed     Behavior: Appropriate  Motor: Normal  Speech/Language:  Clear and Coherent  Affect: Appropriate  Mood: normal  Thought process: normal  Thought content:   WNL  Sensory/Perceptual disturbances:   WNL  Orientation: oriented to person, place, and time/date  Attention: Good  Concentration: Good  Memory: WNL  Fund of knowledge:  Good  Insight:   Good  Judgment:  Good  Impulse Control: Good   Risk Assessment: Danger to Self:  No Self-injurious Behavior: No Danger to Others: No Duty to Warn:no Physical Aggression / Violence:No  Access to Firearms a concern: No  Gang Involvement:No   Subjective:   Jamie Finley participated in the session, in person in the office with the therapist, and consented to treatment. Jamie Finley reviewed the events of the past week. Jamie Finley noted continued improvement in his mood. He noted his continued struggles to accept help from others. He noted often helping others in a similar manner in the past and having difficulty accepting similar help. He continues to plan and has a small vegetable garden that is producing for him. He noted feeling more productive and accomplished, which he believes plays a part in his overall mood improvement. Jamie Finley noted interest in having a male companion but noted difficulty in the past in regards to online dating.  We began processing this and his need for companionship. Therapist encouraged Jamie Finley to identify additional opportunities, in this area, going forward. He would like to revisit the topic of his daughter. We will work on processing this, going forward. Therapist validated Jamie Finley's experience and  feelings and provided supportive therapy.  A follow-up was scheduled for continued treatment.   Interventions: Interpersonal  Diagnosis:   Adjustment disorder with mixed anxiety and depressed mood  Treatment Plan:  Client Abilities/Strengths Jamie Finley is intelligent, forthcoming, and motivated for change.  Support System: Friends and church members.  Client Treatment Preferences Outpatient therapy  Client Statement of Needs Jamie Finley discussed his goals for treatment including improving his self-esteem, improving his overall outlook, managing his overall symptoms, becoming more self-accepting, identify source for mood changes, and improve overall functioning.  Treatment Level Weekly  Symptoms  Depression: Feeling down, loss of interest, insomnia (medical/health), lethargy, overeating, difficulty concentrating, feeling bad about self.   (Status: improved) Anxiety: feeling anxious, difficulty managing worry (not being married again, dying alone, trouble relaxing, restlessness, irritability, feeling afraid that something awful might happen.    (Status: maintained)  Goals:   Jamie Finley experiences symptoms of depression and anxiety/   Target Date: 08-02-22 Frequency: Weekly  Progress: 10 Modality: individual    Therapist will provide referrals for additional resources as appropriate.  Therapist will provide psycho-education regarding Jamie Finley's diagnosis and corresponding treatment approaches and interventions. Licensed Clinical Social Worker, [place], LCSW will support the patient's ability to achieve the goals identified. will employ CBT, BA, Problem-solving, Solution Focused, Mindfulness,  coping skills, & other evidenced-based practices will be used to promote progress towards healthy functioning to help manage decrease symptoms associated with his diagnosis.   Reduce overall level, frequency, and intensity of the feelings of depression & anxiety as evidenced by decreased  overall symptoms from 6 to 7 days/week to 0 to 1 days/week per client report for  at least 3 consecutive months. Verbally express understanding of the relationship between feelings of depression, anxiety and their impact on thinking patterns and behaviors. Verbalize an understanding of the role that distorted thinking plays in creating fears, excessive worry, and ruminations. Increase engagement in self-care including consistent exercise.  Jamie Finley participated in the creation of the treatment plan)   Buena Irish, LCSW

## 2022-02-22 DIAGNOSIS — D3132 Benign neoplasm of left choroid: Secondary | ICD-10-CM | POA: Diagnosis not present

## 2022-02-22 DIAGNOSIS — H524 Presbyopia: Secondary | ICD-10-CM | POA: Diagnosis not present

## 2022-02-22 DIAGNOSIS — H2513 Age-related nuclear cataract, bilateral: Secondary | ICD-10-CM | POA: Diagnosis not present

## 2022-02-22 DIAGNOSIS — E119 Type 2 diabetes mellitus without complications: Secondary | ICD-10-CM | POA: Diagnosis not present

## 2022-02-22 DIAGNOSIS — Z7984 Long term (current) use of oral hypoglycemic drugs: Secondary | ICD-10-CM | POA: Diagnosis not present

## 2022-02-22 DIAGNOSIS — H52223 Regular astigmatism, bilateral: Secondary | ICD-10-CM | POA: Diagnosis not present

## 2022-02-22 DIAGNOSIS — H5203 Hypermetropia, bilateral: Secondary | ICD-10-CM | POA: Diagnosis not present

## 2022-02-22 LAB — HM DIABETES EYE EXAM

## 2022-02-23 ENCOUNTER — Encounter: Payer: Self-pay | Admitting: Family Medicine

## 2022-02-25 ENCOUNTER — Other Ambulatory Visit: Payer: Self-pay | Admitting: Family Medicine

## 2022-03-09 ENCOUNTER — Other Ambulatory Visit: Payer: Self-pay | Admitting: Family Medicine

## 2022-03-10 ENCOUNTER — Ambulatory Visit (INDEPENDENT_AMBULATORY_CARE_PROVIDER_SITE_OTHER): Payer: Medicare PPO | Admitting: Family Medicine

## 2022-03-10 ENCOUNTER — Encounter: Payer: Self-pay | Admitting: Family Medicine

## 2022-03-10 VITALS — BP 90/64 | HR 70 | Temp 98.0°F | Ht 74.0 in | Wt 339.1 lb

## 2022-03-10 DIAGNOSIS — E1159 Type 2 diabetes mellitus with other circulatory complications: Secondary | ICD-10-CM

## 2022-03-10 DIAGNOSIS — K5909 Other constipation: Secondary | ICD-10-CM | POA: Diagnosis not present

## 2022-03-10 DIAGNOSIS — I152 Hypertension secondary to endocrine disorders: Secondary | ICD-10-CM | POA: Diagnosis not present

## 2022-03-10 DIAGNOSIS — M10071 Idiopathic gout, right ankle and foot: Secondary | ICD-10-CM | POA: Diagnosis not present

## 2022-03-10 LAB — GLUCOSE, POCT (MANUAL RESULT ENTRY): POC Glucose: 142 mg/dl — AB (ref 70–99)

## 2022-03-10 MED ORDER — HYDROCHLOROTHIAZIDE 25 MG PO TABS
25.0000 mg | ORAL_TABLET | Freq: Every day | ORAL | 0 refills | Status: DC | PRN
Start: 2022-03-10 — End: 2022-03-19

## 2022-03-10 NOTE — Patient Instructions (Addendum)
Try to add Miralax 17gm once to two times daily until regular BM.   If not improving constipation.. call for GI referral.  Return to metformin XR 500 mg  2 tablet in AM and 2 tabs in PM.  Stop pioglitazone since that can cause swelling.  Change HCTZ to using just as needed for swelling.

## 2022-03-10 NOTE — Assessment & Plan Note (Signed)
Chronic, fairly well controlled with no significant lows on the lower dose medication.  We discussed either continuing his current regimen or making adjustments to possibly continue good sugar control but avoid side effects.  We have decided to have him remain off glipizide but to restart metformin at the previous dose of XR 500 mg 2 tabs p.o. twice daily.  He will instead stop pioglitazone as it could be contributing to his peripheral edema.

## 2022-03-10 NOTE — Progress Notes (Signed)
Patient ID: Jamie Finley, male    DOB: September 21, 1960, 61 y.o.   MRN: [id number]  This visit was conducted in person.  BP 90/64   Pulse 70   Temp 98 F (36.7 C) (Oral)   Ht '6\' 2"'  (1.88 m)   Wt (!) 339 lb 1 oz (153.8 kg)   SpO2 95%   BMI 43.53 kg/m    CC:  Chief Complaint  Patient presents with   Follow-up    BP and sugar    Subjective:   HPI: Jamie Finley is a 61 y.o. male presenting on 03/10/2022 for Follow-up (BP and sugar)   At last OV he had been noting generalized weakness  Blood sugars and blood pressures had been low.. held glipizide, taking 1/2 dose of metformin and low dose lisinopril( on for microalbuminuria) Continued HCTZ as requires for peripheral edema, continue actos 30 mg daily.     He reports FBS:  125-130   He feels improvement of weakness, no lightheadedness.  He continues to have issues with constipation...  BMs are solid and dry... he has been drinking a lot of water. Taking daily Benefiber.   He has tried stool softeners.. small BM.  Using herbal tabs for constipation.   BP Readings from Last 3 Encounters:  03/10/22 90/64  02/16/22 (!) 92/54  02/12/22 103/61   Wt Readings from Last 3 Encounters:  03/10/22 (!) 339 lb 1 oz (153.8 kg)  02/16/22 (!) 340 lb 6 oz (154.4 kg)  02/11/22 (!) 330 lb (149.7 kg)    Relevant past medical, surgical, family and social history reviewed and updated as indicated. Interim medical history since our last visit reviewed. Allergies and medications reviewed and updated. Outpatient Medications Prior to Visit  Medication Sig Dispense Refill   Accu-Chek Softclix Lancets lancets Use to check fasting blood sugar daily 100 each 3   acetaminophen (TYLENOL) 325 MG tablet Take 325 mg by mouth every 4 (four) hours as needed.      aspirin 81 MG tablet Take 81 mg by mouth daily.     blood glucose meter kit and supplies Dispense based on patient and insurance preference. Use to check blood sugar two times a day. 1 each  0   Blood Glucose Monitoring Suppl (ACCU-CHEK GUIDE) w/Device KIT Use to check blood fasting blood sugar daily 1 kit 0   Cholecalciferol (VITAMIN D3) 50 MCG (2000 UT) TABS Take 5-7 tablets by mouth daily.     colchicine 0.6 MG tablet TAKE 1 TABLET BY MOUTH ONCE DAILY AS NEEDED 30 tablet 2   cyclobenzaprine (FLEXERIL) 10 MG tablet Take 1 tablet (10 mg total) by mouth 3 (three) times daily as needed for muscle spasms. 30 tablet 0   gabapentin (NEURONTIN) 400 MG capsule Take 1 capsule (400 mg total) by mouth 2 (two) times daily. 60 capsule 3   glucose blood (ACCU-CHEK GUIDE) test strip Use to check blood fasting blood sugar daily 100 each 3   hydrochlorothiazide (HYDRODIURIL) 25 MG tablet Take 1 tablet (25 mg total) by mouth daily. 30 tablet 0   metFORMIN (GLUCOPHAGE-XR) 500 MG 24 hr tablet Take 2 tablets (1,000 mg total) by mouth in the morning and at bedtime. 360 tablet 1   methocarbamol (ROBAXIN) 750 MG tablet Take 1 tablet (750 mg total) by mouth daily as needed for muscle spasms. 30 tablet 1   Multiple Vitamins-Minerals (ZINC PO) Take 1 tablet by mouth daily.     pioglitazone (ACTOS) 30 MG tablet Take 1 tablet  by mouth once daily 90 tablet 3   rosuvastatin (CRESTOR) 20 MG tablet TAKE 1 TABLET BY MOUTH AT BEDTIME 90 tablet 0   sildenafil (REVATIO) 20 MG tablet Take 5 tablets prior to sexual activity 20 tablet 11   No facility-administered medications prior to visit.     Per HPI unless specifically indicated in ROS section below Review of Systems  Constitutional:  Negative for fatigue and fever.  HENT:  Negative for ear pain.   Eyes:  Negative for pain.  Respiratory:  Negative for cough and shortness of breath.   Cardiovascular:  Negative for chest pain, palpitations and leg swelling.  Gastrointestinal:  Negative for abdominal pain.  Genitourinary:  Negative for dysuria.  Musculoskeletal:  Negative for arthralgias.  Neurological:  Negative for syncope, light-headedness and headaches.   Psychiatric/Behavioral:  Negative for dysphoric mood.    Objective:  BP 90/64   Pulse 70   Temp 98 F (36.7 C) (Oral)   Ht '6\' 2"'  (1.88 m)   Wt (!) 339 lb 1 oz (153.8 kg)   SpO2 95%   BMI 43.53 kg/m   Wt Readings from Last 3 Encounters:  03/10/22 (!) 339 lb 1 oz (153.8 kg)  02/16/22 (!) 340 lb 6 oz (154.4 kg)  02/11/22 (!) 330 lb (149.7 kg)      Physical Exam Constitutional:      Appearance: He is well-developed. He is obese.     Comments: Wheelchair-bound  HENT:     Head: Normocephalic.     Right Ear: Hearing normal.     Left Ear: Hearing normal.     Nose: Nose normal.  Neck:     Thyroid: No thyroid mass or thyromegaly.     Vascular: No carotid bruit.     Trachea: Trachea normal.  Cardiovascular:     Rate and Rhythm: Normal rate and regular rhythm.     Pulses: Normal pulses.     Heart sounds: Heart sounds not distant. No murmur heard.    No friction rub. No gallop.     Comments: No peripheral edema Pulmonary:     Effort: Pulmonary effort is normal. No respiratory distress.     Breath sounds: Normal breath sounds.  Skin:    General: Skin is warm and dry.     Findings: No rash.  Neurological:     Sensory: Sensory deficit present.     Motor: Weakness, atrophy and abnormal muscle tone present.     Coordination: Coordination abnormal.  Psychiatric:        Speech: Speech normal.        Behavior: Behavior normal.        Thought Content: Thought content normal.       Results for orders placed or performed in visit on 03/10/22  POCT glucose (manual entry)  Result Value Ref Range   POC Glucose 142 (A) 70 - 99 mg/dl     COVID 19 screen:  No recent travel or known exposure to COVID19 The patient denies respiratory symptoms of COVID 19 at this time. The importance of social distancing was discussed today.   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  I wonder if low blood pressure is contributing to his weakness.  I will have him stop pioglitazone as it may be contributing to peripheral edema and hopefully this will enable him  to change the hydrochlorothiazide 25 mg to as needed as opposed to daily.  Hopefully this will help with any chronic dehydration from the diuretic. In addition, this could potentially help if dehydration is contributing to chronic constipation and firm stool.      Chronic constipation    Acute worsening of chronic issue  This continues to be a big issue for the patient and may be contributing to his low back pain. He will increase water, continue fiber and will start MiraLAX 1-2 times daily.  If there is no improvement given his spinal cord damage history, I would recommend referral to GI for their specialty recommendations on the best medication to use in this setting.      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  We discussed either continuing his current regimen or making adjustments to possibly continue good sugar control but avoid side effects.  We have decided to have him remain off glipizide but to restart metformin at the previous dose of XR 500 mg 2 tabs p.o. twice daily.  He will instead stop pioglitazone as it could be contributing to his peripheral edema.      Relevant Orders   POCT glucose (manual entry) (Completed)   Gout of right foot    Acute, some recent issues with pain in right knee, now resolved.  There is a nodule below the knee joint over the tibial tuberosity that could be associated with a bone spur or less likely tophi.  We will check uric acid level at his next lab check.      Meds ordered this encounter  Medications   hydrochlorothiazide (HYDRODIURIL) 25 MG tablet    Sig: Take 1 tablet (25 mg total) by mouth daily as needed  (edema).    Dispense:  30 tablet    Refill:  0      [name], MD

## 2022-03-10 NOTE — Assessment & Plan Note (Signed)
Acute worsening of chronic issue  This continues to be a big issue for the patient and may be contributing to his low back pain. He will increase water, continue fiber and will start MiraLAX 1-2 times daily.  If there is no improvement given his spinal cord damage history, I would recommend referral to GI for their specialty recommendations on the best medication to use in this setting.

## 2022-03-10 NOTE — Assessment & Plan Note (Signed)
Acute, some recent issues with pain in right knee, now resolved.  There is a nodule below the knee joint over the tibial tuberosity that could be associated with a bone spur or less likely tophi.  We will check uric acid level at his next lab check.

## 2022-03-10 NOTE — Assessment & Plan Note (Signed)
His blood pressure remains low despite holding lisinopril.  I wonder if low blood pressure is contributing to his weakness.  I will have him stop pioglitazone as it may be contributing to peripheral edema and hopefully this will enable him to change the hydrochlorothiazide 25 mg to as needed as opposed to daily.  Hopefully this will help with any chronic dehydration from the diuretic. In addition, this could potentially help if dehydration is contributing to chronic constipation and firm stool.

## 2022-03-11 ENCOUNTER — Other Ambulatory Visit: Payer: Self-pay | Admitting: Family Medicine

## 2022-03-16 ENCOUNTER — Encounter: Payer: Medicare PPO | Attending: Family Medicine | Admitting: *Deleted

## 2022-03-16 ENCOUNTER — Encounter: Payer: Self-pay | Admitting: *Deleted

## 2022-03-16 VITALS — BP 112/68 | Ht 72.0 in | Wt 334.2 lb

## 2022-03-16 DIAGNOSIS — Z6841 Body Mass Index (BMI) 40.0 and over, adult: Secondary | ICD-10-CM | POA: Insufficient documentation

## 2022-03-16 DIAGNOSIS — I152 Hypertension secondary to endocrine disorders: Secondary | ICD-10-CM | POA: Insufficient documentation

## 2022-03-16 DIAGNOSIS — E1159 Type 2 diabetes mellitus with other circulatory complications: Secondary | ICD-10-CM | POA: Diagnosis not present

## 2022-03-16 DIAGNOSIS — E1142 Type 2 diabetes mellitus with diabetic polyneuropathy: Secondary | ICD-10-CM

## 2022-03-16 DIAGNOSIS — Z713 Dietary counseling and surveillance: Secondary | ICD-10-CM | POA: Insufficient documentation

## 2022-03-16 NOTE — Progress Notes (Signed)
Diabetes Self-Management Education  Visit Type: First/Initial  Appt. Start Time: 1320 Appt. End Time: 1440  03/16/2022  Mr. Jamie Finley, identified by name and date of birth, is a 61 y.o. male with a diagnosis of Diabetes: Type 2.   ASSESSMENT  Blood pressure 112/68, height 6' (1.829 m), weight (!) 334 lb 3.2 oz (151.6 kg). Body mass index is 45.33 kg/m.   Diabetes Self-Management Education - 03/16/22 1531       Visit Information   Visit Type First/Initial      Initial Visit   Diabetes Type Type 2    Date Diagnosed 13-14 years    Are you currently following a meal plan? No    Are you taking your medications as prescribed? Yes      Health Coping   How would you rate your overall health? Fair      Psychosocial Assessment   Patient Belief/Attitude about Diabetes Other (comment)   "Don't usually think about it though I probably should more often"   What is the hardest part about your diabetes right now, causing you the most concern, or is the most worrisome to you about your diabetes?   Making healty food and beverage choices    Self-care barriers Unsteady gait/risk for falls    Self-management support Doctor's office;Friends    Patient Concerns Nutrition/Meal planning;Glycemic Control;Medication;Monitoring;Weight Control    Special Needs None    Preferred Learning Style Hands on;Other (comment)   talking/discussion   Learning Readiness Contemplating    How often do you need to have someone help you when you read instructions, pamphlets, or other written materials from your doctor or pharmacy? 1 - Never    What is the last grade level you completed in school? BA degree      Pre-Education Assessment   Patient understands the diabetes disease and treatment process. Needs Review    Patient understands incorporating nutritional management into lifestyle. Needs Instruction    Patient undertands incorporating physical activity into lifestyle. N/A (comment)    Patient  understands using medications safely. Needs Review    Patient understands monitoring blood glucose, interpreting and using results Comprehends key points    Patient understands prevention, detection, and treatment of acute complications. Needs Instruction    Patient understands prevention, detection, and treatment of chronic complications. Needs Review    Patient understands how to develop strategies to address psychosocial issues. Needs Instruction    Patient understands how to develop strategies to promote health/change behavior. Needs Instruction      Complications   Last HgB A1C per patient/outside source 5.7 %   02/16/2022   How often do you check your blood sugar? 1-2 times/day    Fasting Blood glucose range (mg/dL) 70-129;130-179   Pt reports FBG's of 120-145 mg/dL.   Have you had a dilated eye exam in the past 12 months? Yes    Have you had a dental exam in the past 12 months? Yes    Are you checking your feet? Yes    How many days per week are you checking your feet? 7      Dietary Intake   Breakfast usually eats out for most meals - pork chop biscuit at [place] (morning) reports snacking all day - nuts, seeds, ice cream at night    Lunch skips or eats burger, fries; tacos; roast beef; pizza    Dinner fried chicken, beef, pork; salads at [facility]  or [employer]; reports he loves veggies but  doesn't cook them now    Beverage(s) water, soda      Activity / Exercise   Activity / Exercise Type ADL's      Patient Education   Previous Diabetes Education Yes (please comment)   He can't remember when or if he came to education in the past   Disease Pathophysiology Factors that contribute to the development of diabetes;Explored patient's options for treatment of their diabetes    Healthy Eating Role of diet in the treatment of diabetes and the relationship between the three main macronutrients and blood glucose level;Food label reading, portion sizes and measuring  food.;Reviewed blood glucose goals for pre and post meals and how to evaluate the patients' food intake on their blood glucose level.;Information on hints to eating out and maintain blood glucose control.    Being Active Role of exercise on diabetes management, blood pressure control and cardiac health.    Medications Reviewed patients medication for diabetes, action, purpose, timing of dose and side effects.    Monitoring Purpose and frequency of SMBG.;Taught/discussed recording of test results and interpretation of SMBG.;Identified appropriate SMBG and/or A1C goals.    Chronic complications Relationship between chronic complications and blood glucose control    Diabetes Stress and Support Identified and addressed patients feelings and concerns about diabetes;Worked with patient to identify barriers to care and solutions      Individualized Goals (developed by patient)   Reducing Risk Other (comment)   improve blood sugars, decrease medications, prevent diabetes complications, lose weight     Outcomes   Expected Outcomes Demonstrated interest in learning but significant barriers to change    Future DMSE 4-6 wks         Individualized Plan for Diabetes Self-Management Training:   Learning Objective:  Patient will have a greater understanding of diabetes self-management. Patient education plan is to attend individual and/or group sessions per assessed needs and concerns.   Plan:   Patient Instructions  Check blood sugars before breakfast or 2 hrs after one meal every day Bring blood sugar records to the next appointment  Eat 3 meals day,   2  snacks a day Space meals 4-6 hours apart Allow 2-3 hours between meals and snacks Don't skip meals - eat at least 1 protein and 1 carbohydrate serving Avoid sugar sweetened drinks (soda) Limit fried foods and desserts/sweets  Return for appointment on:  Monday April 17, 2022 at 1:15 pm with Freda Munro (nurse)  Expected Outcomes:  Demonstrated  interest in learning but significant barriers to change  Education material provided:  General Meal Planning Guidelines Simple Meal Plan Healthy Snack Choices (ADA)  If problems or questions, patient to contact team via:   Johny Drilling, RN, [place] [phone]  Future DSME appointment: 4-6 wks April 17, 2022 with this educator

## 2022-03-16 NOTE — Patient Instructions (Addendum)
Check blood sugars before breakfast or 2 hrs after one meal every day Bring blood sugar records to the next appointment  Eat 3 meals day,   2  snacks a day Space meals 4-6 hours apart Allow 2-3 hours between meals and snacks Don't skip meals - eat at least 1 protein and 1 carbohydrate serving Avoid sugar sweetened drinks (soda) Limit fried foods and desserts/sweets  Return for appointment on:  Monday April 17, 2022 at 1:15 pm with Freda Munro (nurse)

## 2022-03-17 ENCOUNTER — Other Ambulatory Visit: Payer: Self-pay | Admitting: Family Medicine

## 2022-03-20 DIAGNOSIS — Z8673 Personal history of transient ischemic attack (TIA), and cerebral infarction without residual deficits: Secondary | ICD-10-CM | POA: Diagnosis not present

## 2022-03-20 DIAGNOSIS — G834 Cauda equina syndrome: Secondary | ICD-10-CM | POA: Diagnosis not present

## 2022-03-21 ENCOUNTER — Ambulatory Visit: Payer: Medicare PPO | Admitting: Psychology

## 2022-04-13 ENCOUNTER — Encounter: Payer: Self-pay | Admitting: Family Medicine

## 2022-04-14 ENCOUNTER — Other Ambulatory Visit: Payer: Self-pay | Admitting: Family Medicine

## 2022-04-14 DIAGNOSIS — G834 Cauda equina syndrome: Secondary | ICD-10-CM

## 2022-04-14 DIAGNOSIS — K5909 Other constipation: Secondary | ICD-10-CM

## 2022-04-17 ENCOUNTER — Encounter: Payer: Self-pay | Admitting: *Deleted

## 2022-04-17 ENCOUNTER — Encounter: Payer: Medicare PPO | Attending: Family Medicine | Admitting: *Deleted

## 2022-04-17 VITALS — BP 110/70 | Wt 332.6 lb

## 2022-04-17 DIAGNOSIS — E1142 Type 2 diabetes mellitus with diabetic polyneuropathy: Secondary | ICD-10-CM | POA: Diagnosis not present

## 2022-04-17 NOTE — Patient Instructions (Addendum)
Check blood sugars before breakfast or 2 hrs after one meal every day  Eat 3 meals day,   2  snacks a day Space meals 4-6 hours apart Allow 2-3 hours between meals and snacks Pay attention to serving sizes and portion control Don't skip meals - eat at least 1 protein and 1 carbohydrate serving Limit desserts/sweets and fried foods  Avoid sugar sweetened drinks (soda)

## 2022-04-17 NOTE — Progress Notes (Signed)
Diabetes Self-Management Education  Visit Type: Follow-up  Appt. Start Time: 1315 Appt. End Time: 1430  04/17/2022  Mr. Jamie Finley, identified by name and date of birth, is a 61 y.o. male with a diagnosis of Diabetes: Type 2.   ASSESSMENT  Blood pressure 110/70, weight (!) 332 lb 9.6 oz (150.9 kg). Body mass index is 45.11 kg/m.   Diabetes Self-Management Education - 04/17/22 1450       Visit Information   Visit Type Follow-up      Initial Visit   Diabetes Type Type 2      Complications   How often do you check your blood sugar? 0 times/day (not testing)   not tested in the last few weeks   Have you had a dilated eye exam in the past 12 months? Yes    Have you had a dental exam in the past 12 months? Yes    Are you checking your feet? Yes    How many days per week are you checking your feet? 7      Dietary Intake   Breakfast 2 meals and multiple snacks/day    Beverage(s) water, soda      Activity / Exercise   Activity / Exercise Type ADL's      Patient Education   Disease Pathophysiology Factors that contribute to the development of diabetes;Explored patient's options for treatment of their diabetes    Healthy Eating Role of diet in the treatment of diabetes and the relationship between the three main macronutrients and blood glucose level;Food label reading, portion sizes and measuring food.;Carbohydrate counting;Reviewed blood glucose goals for pre and post meals and how to evaluate the patients' food intake on their blood glucose level.;Information on hints to eating out and maintain blood glucose control.    Being Active Role of exercise on diabetes management, blood pressure control and cardiac health.    Medications Reviewed patients medication for diabetes, action, purpose, timing of dose and side effects.;Other (comment)   Discussed use of GLP-1 via injectable or oral form   Monitoring Purpose and frequency of SMBG.;Interpreting lab values - A1C, lipid, urine  microalbumina.;Identified appropriate SMBG and/or A1C goals.;Daily foot exams    Chronic complications Relationship between chronic complications and blood glucose control    Diabetes Stress and Support Identified and addressed patients feelings and concerns about diabetes;Worked with patient to identify barriers to care and solutions      Individualized Goals (developed by patient)   Nutrition Follow meal plan discussed;General guidelines for healthy choices and portions discussed    Monitoring  Test my blood glucose as discussed    Problem Solving Eating Pattern    Reducing Risk do foot checks daily      Post-Education Assessment   Patient understands the diabetes disease and treatment process. Demonstrates understanding / competency    Patient understands incorporating nutritional management into lifestyle. Needs Review    Patient undertands incorporating physical activity into lifestyle. N/A    Patient understands using medications safely. Comphrehends key points    Patient understands monitoring blood glucose, interpreting and using results Comprehends key points    Patient understands prevention, detection, and treatment of acute complications. N/A    Patient understands prevention, detection, and treatment of chronic complications. Demonstrates understanding / competency    Patient understands how to develop strategies to address psychosocial issues. Comprehends key points    Patient understands how to develop strategies to promote health/change behavior. Comprehends key points      Outcomes  Expected Outcomes Demonstrated interest in learning. Expect positive outcomes    Program Status Completed      Subsequent Visit   Since your last visit have you continued or begun to take your medications as prescribed? Yes    Since your last visit have you had your blood pressure checked? No    Since your last visit have you experienced any weight changes? Loss    Weight Loss (lbs) 1.6     Since your last visit, are you checking your blood glucose at least once a day? No         Individualized Plan for Diabetes Self-Management Training:   Learning Objective:  Patient will have a greater understanding of diabetes self-management. Patient education plan is to attend individual and/or group sessions per assessed needs and concerns.   Plan:   Patient Instructions  Check blood sugars before breakfast or 2 hrs after one meal every day  Eat 3 meals day,   2  snacks a day Space meals 4-6 hours apart Allow 2-3 hours between meals and snacks Pay attention to serving sizes and portion control Don't skip meals - eat at least 1 protein and 1 carbohydrate serving Limit desserts/sweets and fried foods  Avoid sugar sweetened drinks (soda)  Expected Outcomes:  Demonstrated interest in learning but significant barriers to change  Education material provided:  Simple Meal Plan Planning a Balanced Meal Quick and Balanced Meals Combination and Fast Food Lists Nutrition Prescription Dietary Fiber Facts Food High in Fiber Tips for Successful Weight Loss  If problems or questions, patient to contact team via:   Johny Drilling, RN, CCM, [place] [phone]  Future DSME appointment:  PRN

## 2022-04-18 ENCOUNTER — Ambulatory Visit (INDEPENDENT_AMBULATORY_CARE_PROVIDER_SITE_OTHER): Payer: Medicare PPO | Admitting: Psychology

## 2022-04-18 DIAGNOSIS — F4323 Adjustment disorder with mixed anxiety and depressed mood: Secondary | ICD-10-CM | POA: Diagnosis not present

## 2022-04-18 NOTE — Progress Notes (Signed)
[facility] Counselor/Therapist Progress Note  Patient ID: Jamie Finley, MRN: [id number]    Date: 04/18/22  Time Spent: 2:58 pm - 3:40 pm :  42 Minutes  Treatment Type: Individual Therapy.  Reported Symptoms: Anxiety, interpersonal stressors.   Mental Status Exam: Appearance:  Fairly Groomed     Behavior: Appropriate  Motor: Normal  Speech/Language:  Clear and Coherent  Affect: Appropriate  Mood: normal  Thought process: normal  Thought content:   WNL  Sensory/Perceptual disturbances:   WNL  Orientation: oriented to person, place, and time/date  Attention: Good  Concentration: Good  Memory: WNL  Fund of knowledge:  Good  Insight:   Good  Judgment:  Good  Impulse Control: Good   Risk Assessment: Danger to Self:  No Self-injurious Behavior: No Danger to Others: No Duty to Warn:no Physical Aggression / Violence:No  Access to Firearms a concern: No  Gang Involvement:No   Subjective:   Kainan Patty participated in the session, in person in the office with the therapist, and consented to treatment. Jen reviewed the events of the past week. Thai noted physical issues including intestinal, knee, and thigh issues. He noted his efforts to manage his health, particularly his blood sugar, which has recently improved. He is working with a nutritionist to positively affect his health and weight. He noted efforts to reduce his sugar intake. We engaged in problem-solving and planning to identify ways to address consistency and follow through. He noted beginning to work part-time and noted looking forward to this. He noted a need for continued socializing. He did noted continued efforts to socialize when the opportunity arises. Therapist validated Rockland's experience and feelings and provided supportive therapy.  A follow-up was scheduled for continued treatment.   Interventions:  Problem-solving  Diagnosis:   Adjustment disorder with mixed anxiety and  depressed mood  Treatment Plan:  Client Abilities/Strengths Telesforo is intelligent, forthcoming, and motivated for change.  Support System: Friends and church members.  Client Treatment Preferences Outpatient therapy  Client Statement of Needs Stylianos discussed his goals for treatment including improving his self-esteem, improving his overall outlook, managing his overall symptoms, becoming more self-accepting, identify source for mood changes, and improve overall functioning.  Treatment Level Weekly  Symptoms  Depression: Feeling down, loss of interest, insomnia (medical/health), lethargy, overeating, difficulty concentrating, feeling bad about self.   (Status: improved) Anxiety: feeling anxious, difficulty managing worry (not being married again, dying alone, trouble relaxing, restlessness, irritability, feeling afraid that something awful might happen.    (Status: maintained)  Goals:   [name] experiences symptoms of depression and anxiety/   Target Date: 08-02-22 Frequency: Weekly  Progress: 10 Modality: individual    Therapist will provide referrals for additional resources as appropriate.  Therapist will provide psycho-education regarding Zyheir's diagnosis and corresponding treatment approaches and interventions. Licensed Clinical Social Worker, [place], LCSW will support the patient's ability to achieve the goals identified. will employ CBT, BA, Problem-solving, Solution Focused, Mindfulness,  coping skills, & other evidenced-based practices will be used to promote progress towards healthy functioning to help manage decrease symptoms associated with his diagnosis.   Reduce overall level, frequency, and intensity of the feelings of depression & anxiety as evidenced by decreased overall symptoms from 6 to 7 days/week to 0 to 1 days/week per client report for at least 3 consecutive months. Verbally express understanding of the relationship between feelings of  depression, anxiety and their impact on thinking patterns and behaviors. Verbalize an understanding of the role that distorted thinking  plays in creating fears, excessive worry, and ruminations. Increase engagement in self-care including consistent exercise.  [name] participated in the creation of the treatment plan)   Buena Irish, LCSW

## 2022-04-20 DIAGNOSIS — Z8673 Personal history of transient ischemic attack (TIA), and cerebral infarction without residual deficits: Secondary | ICD-10-CM | POA: Diagnosis not present

## 2022-04-20 DIAGNOSIS — G834 Cauda equina syndrome: Secondary | ICD-10-CM | POA: Diagnosis not present

## 2022-05-04 ENCOUNTER — Other Ambulatory Visit: Payer: Self-pay | Admitting: Family Medicine

## 2022-05-04 DIAGNOSIS — E785 Hyperlipidemia, unspecified: Secondary | ICD-10-CM

## 2022-05-06 ENCOUNTER — Telehealth: Payer: Self-pay | Admitting: Family Medicine

## 2022-05-06 DIAGNOSIS — E1159 Type 2 diabetes mellitus with other circulatory complications: Secondary | ICD-10-CM

## 2022-05-06 NOTE — Telephone Encounter (Signed)
-----   Message from Ellamae Sia sent at 05/01/2022  4:24 PM EDT ----- Regarding: Lab orders for Thursday, 8.31.23 Lab orders for a 3 month follow up appt.

## 2022-05-11 ENCOUNTER — Other Ambulatory Visit (INDEPENDENT_AMBULATORY_CARE_PROVIDER_SITE_OTHER): Payer: Medicare PPO

## 2022-05-11 DIAGNOSIS — E1159 Type 2 diabetes mellitus with other circulatory complications: Secondary | ICD-10-CM

## 2022-05-11 LAB — LIPID PANEL
Cholesterol: 149 mg/dL (ref 0–200)
HDL: 40.3 mg/dL (ref >39.00)
NonHDL: 108.25
Total CHOL/HDL Ratio: 4
Triglycerides: 236 mg/dL — ABNORMAL HIGH (ref 0.0–149.0)
VLDL: 47.2 mg/dL — ABNORMAL HIGH (ref 0.0–40.0)

## 2022-05-11 LAB — HEMOGLOBIN A1C: Hgb A1c MFr Bld: 9.2 % — ABNORMAL HIGH (ref 4.6–6.5)

## 2022-05-11 LAB — COMPREHENSIVE METABOLIC PANEL
ALT: 14 U/L (ref 0–53)
AST: 15 U/L (ref 0–37)
Albumin: 4 g/dL (ref 3.5–5.2)
Alkaline Phosphatase: 77 U/L (ref 39–117)
BUN: 13 mg/dL (ref 6–23)
CO2: 31 mEq/L (ref 19–32)
Calcium: 9.4 mg/dL (ref 8.4–10.5)
Chloride: 95 mEq/L — ABNORMAL LOW (ref 96–112)
Creatinine, Ser: 1.21 mg/dL (ref 0.40–1.50)
GFR: 64.62 mL/min (ref >60.00)
Glucose, Bld: 248 mg/dL — ABNORMAL HIGH (ref 70–99)
Potassium: 3.5 mEq/L (ref 3.5–5.1)
Sodium: 134 mEq/L — ABNORMAL LOW (ref 135–145)
Total Bilirubin: 0.8 mg/dL (ref 0.2–1.2)
Total Protein: 7.5 g/dL (ref 6.0–8.3)

## 2022-05-11 LAB — LDL CHOLESTEROL, DIRECT: Direct LDL: 81 mg/dL

## 2022-05-11 NOTE — Addendum Note (Signed)
Addended by: Ellamae Sia on: 05/11/2022 11:21 AM   Modules accepted: Orders

## 2022-05-11 NOTE — Progress Notes (Signed)
No critical labs need to be addressed urgently. We will discuss labs in detail at upcoming office visit.   

## 2022-05-12 LAB — MICROALBUMIN / CREATININE URINE RATIO
Creatinine,U: 85.8 mg/dL
Microalb Creat Ratio: 3.2 mg/g (ref 0.0–30.0)
Microalb, Ur: 2.7 mg/dL — ABNORMAL HIGH (ref 0.0–1.9)

## 2022-05-12 NOTE — Progress Notes (Signed)
No critical labs need to be addressed urgently. We will discuss labs in detail at upcoming office visit.   

## 2022-05-19 ENCOUNTER — Encounter: Payer: Self-pay | Admitting: Family Medicine

## 2022-05-19 ENCOUNTER — Ambulatory Visit (INDEPENDENT_AMBULATORY_CARE_PROVIDER_SITE_OTHER): Payer: Medicare PPO | Admitting: Family Medicine

## 2022-05-19 VITALS — BP 96/60 | HR 77 | Temp 98.3°F | Ht 74.0 in | Wt 332.2 lb

## 2022-05-19 DIAGNOSIS — E1143 Type 2 diabetes mellitus with diabetic autonomic (poly)neuropathy: Secondary | ICD-10-CM

## 2022-05-19 DIAGNOSIS — E1159 Type 2 diabetes mellitus with other circulatory complications: Secondary | ICD-10-CM

## 2022-05-19 DIAGNOSIS — K5909 Other constipation: Secondary | ICD-10-CM

## 2022-05-19 DIAGNOSIS — L02211 Cutaneous abscess of abdominal wall: Secondary | ICD-10-CM | POA: Diagnosis not present

## 2022-05-19 DIAGNOSIS — M10071 Idiopathic gout, right ankle and foot: Secondary | ICD-10-CM | POA: Diagnosis not present

## 2022-05-19 DIAGNOSIS — E785 Hyperlipidemia, unspecified: Secondary | ICD-10-CM

## 2022-05-19 DIAGNOSIS — E1149 Type 2 diabetes mellitus with other diabetic neurological complication: Secondary | ICD-10-CM | POA: Diagnosis not present

## 2022-05-19 DIAGNOSIS — E1169 Type 2 diabetes mellitus with other specified complication: Secondary | ICD-10-CM

## 2022-05-19 DIAGNOSIS — I152 Hypertension secondary to endocrine disorders: Secondary | ICD-10-CM

## 2022-05-19 MED ORDER — DOXYCYCLINE HYCLATE 100 MG PO TABS: 100.0000 mg | ORAL_TABLET | Freq: Two times a day (BID) | ORAL | 0 refills | Status: DC

## 2022-05-19 NOTE — Assessment & Plan Note (Signed)
Chronic, stable Associated with diabetes.

## 2022-05-19 NOTE — Patient Instructions (Addendum)
Continue daily fiber regularly. Use Miralax as needed when you become constipation. Work on portion size, low carb cooking.  Continue metformin and HCTZ.  Complete doxy x 7 days for abscess in left lower abdomen.. call if redness spreading, new fever. Start warm compresses 2-3 times daily and apply topical steroid cream.

## 2022-05-19 NOTE — Progress Notes (Signed)
Patient ID: Jamie Finley, male    DOB: 09/28/1960, 61 y.o.   MRN: [id number]  This visit was conducted in person.  Pulse 77   Temp 98.3 F (36.8 C) (Oral)   Ht $R'6\' 2"'Ce$  (1.88 m)   Wt (!) 332 lb 4 oz (150.7 kg)   SpO2 96%   BMI 42.66 kg/m    CC:  Chief Complaint  Patient presents with  . Diabetes  . Mass    Left Lower Abdomen    Subjective:   HPI: Jamie Finley is a 61 y.o. male presenting on 05/19/2022 for Diabetes and Mass (Left Lower Abdomen)  He has noted a mass in his left lower abdomen over the last few days. Feels closer to the surface of skin then deep.  Area is slightly tender, no redness.  Below area where he has 2 skin tags.   Diabetes:  At last OV  03/10/22 stopped actos as possibly causing peripheral edema and  change HCTZ to prn.  Off glipizide  Restarted metformin XR 500 mg 2 tabs BID  Has been seeing the nutritionist.. only paid for 2 visits... he is having trouble cutting back on soda, increasing water.  He has started part-time job... more income.. eating more but not as much better.  Lab Results  Component Value Date   HGBA1C 9.2 (H) 05/11/2022  Using medications without difficulties: Hypoglycemic episodes: Hyperglycemic episodes: Feet problems: none Blood Sugars averaging: eye exam within last year: yes  Hypertension:   Well controlled on  HCTZ  daily ( was not able to stop  and use as needed given swelling returned), less lows.. no further weakness BP Readings from Last 3 Encounters:  04/17/22 110/70  03/16/22 112/68  03/10/22 90/64  Using medication without problems or lightheadedness:  none Chest pain with exertion: Edema: Short of breath: Average home BPs: Other issues:  Chronic constipation: Now having BM  soft stool.   Some issues with urine flow.. off and on  some difficulty getting urine out given neurogenic bladder.     Gout:  Elevated Cholesterol:  LDL almost at goal < 70 on rosuvastatin 20 mg daily. Lab Results   Component Value Date   CHOL 149 05/11/2022   HDL 40.30 05/11/2022   LDLCALC 62 03/02/2020   LDLDIRECT 81.0 05/11/2022   TRIG 236.0 (H) 05/11/2022   CHOLHDL 4 05/11/2022  Using medications without problems: Muscle aches:  Diet compliance: Exercise: Other complaints:   Relevant past medical, surgical, family and social history reviewed and updated as indicated. Interim medical history since our last visit reviewed. Allergies and medications reviewed and updated. Outpatient Medications Prior to Visit  Medication Sig Dispense Refill  . Accu-Chek Softclix Lancets lancets Use to check fasting blood sugar daily 100 each 3  . acetaminophen (TYLENOL) 325 MG tablet Take 325 mg by mouth every 4 (four) hours as needed.     Marland Kitchen aspirin 81 MG tablet Take 81 mg by mouth daily.    . blood glucose meter kit and supplies Dispense based on patient and insurance preference. Use to check blood sugar two times a day. 1 each 0  . Blood Glucose Monitoring Suppl (ACCU-CHEK GUIDE) w/Device KIT Use to check blood fasting blood sugar daily 1 kit 0  . Cholecalciferol (VITAMIN D3) 50 MCG (2000 UT) TABS Take 5-7 tablets by mouth daily.    . colchicine 0.6 MG tablet TAKE 1 TABLET BY MOUTH ONCE DAILY AS NEEDED 30 tablet 2  . cyclobenzaprine (FLEXERIL) 10 MG  tablet Take 1 tablet (10 mg total) by mouth 3 (three) times daily as needed for muscle spasms. 30 tablet 0  . gabapentin (NEURONTIN) 400 MG capsule Take 1 capsule (400 mg total) by mouth 2 (two) times daily. 60 capsule 3  . glucose blood (ACCU-CHEK GUIDE) test strip Use to check blood fasting blood sugar daily 100 each 3  . hydrochlorothiazide (HYDRODIURIL) 25 MG tablet TAKE 1 TABLET BY MOUTH ONCE DAILY AS NEEDED 90 tablet 0  . metFORMIN (GLUCOPHAGE-XR) 500 MG 24 hr tablet Take 2 tablets (1,000 mg total) by mouth in the morning and at bedtime. 360 tablet 1  . methocarbamol (ROBAXIN) 750 MG tablet Take 1 tablet (750 mg total) by mouth daily as needed for muscle  spasms. 30 tablet 1  . Multiple Vitamins-Minerals (ZINC PO) Take 1 tablet by mouth daily.    . polyethylene glycol (MIRALAX / GLYCOLAX) 17 g packet Take 17 g by mouth daily.    . rosuvastatin (CRESTOR) 20 MG tablet TAKE 1 TABLET BY MOUTH AT BEDTIME 90 tablet 0  . sildenafil (REVATIO) 20 MG tablet Take 5 tablets prior to sexual activity 20 tablet 11  . Wheat Dextrin (BENEFIBER DRINK MIX PO) Take 1 packet by mouth daily.     No facility-administered medications prior to visit.     Per HPI unless specifically indicated in ROS section below Review of Systems Objective:  Pulse 77   Temp 98.3 F (36.8 C) (Oral)   Ht $R'6\' 2"'TD$  (1.88 m)   Wt (!) 332 lb 4 oz (150.7 kg)   SpO2 96%   BMI 42.66 kg/m   Wt Readings from Last 3 Encounters:  05/19/22 (!) 332 lb 4 oz (150.7 kg)  04/17/22 (!) 332 lb 9.6 oz (150.9 kg)  03/16/22 (!) 334 lb 3.2 oz (151.6 kg)      Physical Exam    Results for orders placed or performed in visit on 05/11/22  Comprehensive metabolic panel  Result Value Ref Range   Sodium 134 (L) 135 - 145 mEq/L   Potassium 3.5 3.5 - 5.1 mEq/L   Chloride 95 (L) 96 - 112 mEq/L   CO2 31 19 - 32 mEq/L   Glucose, Bld 248 (H) 70 - 99 mg/dL   BUN 13 6 - 23 mg/dL   Creatinine, Ser 1.21 0.40 - 1.50 mg/dL   Total Bilirubin 0.8 0.2 - 1.2 mg/dL   Alkaline Phosphatase 77 39 - 117 U/L   AST 15 0 - 37 U/L   ALT 14 0 - 53 U/L   Total Protein 7.5 6.0 - 8.3 g/dL   Albumin 4.0 3.5 - 5.2 g/dL   GFR 64.62 >60.00 mL/min   Calcium 9.4 8.4 - 10.5 mg/dL  Lipid panel  Result Value Ref Range   Cholesterol 149 0 - 200 mg/dL   Triglycerides 236.0 (H) 0.0 - 149.0 mg/dL   HDL 40.30 >39.00 mg/dL   VLDL 47.2 (H) 0.0 - 40.0 mg/dL   Total CHOL/HDL Ratio 4    NonHDL 108.25   Hemoglobin A1c  Result Value Ref Range   Hgb A1c MFr Bld 9.2 (H) 4.6 - 6.5 %  LDL cholesterol, direct  Result Value Ref Range   Direct LDL 81.0 mg/dL  Microalbumin / creatinine urine ratio  Result Value Ref Range   Microalb, Ur  2.7 (H) 0.0 - 1.9 mg/dL   Creatinine,U 85.8 mg/dL   Microalb Creat Ratio 3.2 0.0 - 30.0 mg/g     COVID 19 screen:  No recent  travel or known exposure to [place] The patient denies respiratory symptoms of COVID 19 at this time. The importance of social distancing was discussed today.   Assessment and Plan     [name], MD

## 2022-05-19 NOTE — Assessment & Plan Note (Signed)
Stable, chronic.  Continue current medication.   Crestor 20 mg p.o. daily

## 2022-05-19 NOTE — Assessment & Plan Note (Signed)
Stable, chronic.  Continue current medication.   HCTZ 25 mg p.o. daily as needed swelling

## 2022-05-21 DIAGNOSIS — Z8673 Personal history of transient ischemic attack (TIA), and cerebral infarction without residual deficits: Secondary | ICD-10-CM | POA: Diagnosis not present

## 2022-05-21 DIAGNOSIS — G834 Cauda equina syndrome: Secondary | ICD-10-CM | POA: Diagnosis not present

## 2022-05-22 NOTE — Assessment & Plan Note (Signed)
Acute, area noted as a mass per patient appears to be a small abscess.  We will treat with warm compresses 2-3 times daily and initiate oral antibiotics. Will treat with doxycycline 100 mg p.o. twice daily for coverage of possible MRSA.

## 2022-05-22 NOTE — Assessment & Plan Note (Signed)
Chronic, worsened control  He is feeling significantly better with no further presyncopal or dizzy spells on less diabetes medication.  At this point though we have tip to the other side of the spectrum and he is now having elevated blood sugars. He will remain off Actos and glipizide but continue metformin XR 500 mg 2 tabs p.o. twice daily.  He has been seeing a nutritionist and is making significant strides with improvement in his diet.  He is unable to exercise.  We will reevaluate A1c in 3 months and if not improving we can consider a medication such as GLP-1. If needed he would be a candidate for pharmacy referral for medication assistance.

## 2022-05-22 NOTE — Assessment & Plan Note (Signed)
Continue daily fiber regularly. Use Miralax as needed when you become constipation.

## 2022-05-29 ENCOUNTER — Ambulatory Visit: Payer: Medicare PPO | Admitting: Psychology

## 2022-06-06 ENCOUNTER — Other Ambulatory Visit: Payer: Self-pay | Admitting: Family Medicine

## 2022-06-07 ENCOUNTER — Telehealth: Payer: Self-pay | Admitting: Family Medicine

## 2022-06-07 NOTE — Telephone Encounter (Signed)
Patient called and stated he has gotten an automated voicemail about getting his A1C checked but he stated he already has had in done in office. Call back number [phone].

## 2022-06-07 NOTE — Telephone Encounter (Addendum)
I am sure it was some type of automated call.  Patient had office visit on 05/19/22 and A1c was done at that time.   Patient notified by telephone that he is up to date on his A1c.

## 2022-06-20 ENCOUNTER — Other Ambulatory Visit: Payer: Self-pay | Admitting: Family Medicine

## 2022-06-20 DIAGNOSIS — Z8673 Personal history of transient ischemic attack (TIA), and cerebral infarction without residual deficits: Secondary | ICD-10-CM | POA: Diagnosis not present

## 2022-06-20 DIAGNOSIS — G834 Cauda equina syndrome: Secondary | ICD-10-CM | POA: Diagnosis not present

## 2022-07-21 DIAGNOSIS — G834 Cauda equina syndrome: Secondary | ICD-10-CM | POA: Diagnosis not present

## 2022-07-21 DIAGNOSIS — Z8673 Personal history of transient ischemic attack (TIA), and cerebral infarction without residual deficits: Secondary | ICD-10-CM | POA: Diagnosis not present

## 2022-07-31 ENCOUNTER — Other Ambulatory Visit: Payer: Self-pay | Admitting: Family Medicine

## 2022-07-31 DIAGNOSIS — E785 Hyperlipidemia, unspecified: Secondary | ICD-10-CM

## 2022-08-10 ENCOUNTER — Ambulatory Visit: Payer: Medicare PPO

## 2022-08-17 ENCOUNTER — Ambulatory Visit: Payer: Medicare PPO

## 2022-08-18 ENCOUNTER — Encounter: Payer: Self-pay | Admitting: Family Medicine

## 2022-08-18 ENCOUNTER — Ambulatory Visit (INDEPENDENT_AMBULATORY_CARE_PROVIDER_SITE_OTHER): Payer: Medicare PPO | Admitting: Family Medicine

## 2022-08-18 VITALS — BP 90/60 | HR 81 | Temp 98.4°F | Ht 74.0 in | Wt 326.2 lb

## 2022-08-18 DIAGNOSIS — E1149 Type 2 diabetes mellitus with other diabetic neurological complication: Secondary | ICD-10-CM

## 2022-08-18 DIAGNOSIS — L602 Onychogryphosis: Secondary | ICD-10-CM

## 2022-08-18 LAB — POCT GLYCOSYLATED HEMOGLOBIN (HGB A1C): Hemoglobin A1C: 12.4 % — AB (ref 4.0–5.6)

## 2022-08-18 MED ORDER — COLCHICINE 0.6 MG PO TABS: 0.6000 mg | ORAL_TABLET | Freq: Every day | ORAL | 2 refills | Status: DC | PRN

## 2022-08-18 NOTE — Patient Instructions (Addendum)
Restart glipizide  XL 10 mg daily.. take with food.Marland Kitchen at least until we can get you set up with the pharmacist.  We will make referral pharmacy to discuss coverage of Ozempic or other GLP1 meds

## 2022-08-18 NOTE — Progress Notes (Signed)
Patient ID: Jamie Finley, male    DOB: Feb 11, 1961, 61 y.o.   MRN: [id number]  This visit was conducted in person.  BP 90/60   Pulse 81   Temp 98.4 F (36.9 C) (Oral)   Ht _0  (1.88 m)   Wt (!) 326 lb 4 oz (148 kg)   SpO2 93%   BMI 41.89 kg/m    CC:  Chief Complaint  Patient presents with   Diabetes    Subjective:   HPI: Jamie Finley is a 61 y.o. male presenting on 08/18/2022 for Diabetes  Diabetes:  Significant worsening of A1C in last 3 months. He had been feeling dizzy and unwell with ACTOS and glipizide. Only taking metformin XR 500 mg 2 tabs BID S/P nutritionist  He has been having issues with diet control... trying to decrease bread but still eating sweets. Drinking more water but still drinking Cheerwine. Lab Results  Component Value Date   HGBA1C 12.4 (A) 08/18/2022  Using medications without difficulties: Hypoglycemic episodes: Hyperglycemic episodes: Feet problems: Blood Sugars averaging: eye exam within last year:       Relevant past medical, surgical, family and social history reviewed and updated as indicated. Interim medical history since our last visit reviewed. Allergies and medications reviewed and updated. Outpatient Medications Prior to Visit  Medication Sig Dispense Refill   Accu-Chek Softclix Lancets lancets Use to check fasting blood sugar daily 100 each 3   acetaminophen (TYLENOL) 325 MG tablet Take 325 mg by mouth every 4 (four) hours as needed.      aspirin 81 MG tablet Take 81 mg by mouth daily.     blood glucose meter kit and supplies Dispense based on patient and insurance preference. Use to check blood sugar two times a day. 1 each 0   Blood Glucose Monitoring Suppl (ACCU-CHEK GUIDE) w/Device KIT Use to check blood fasting blood sugar daily 1 kit 0   Cholecalciferol (VITAMIN D3) 50 MCG (2000 UT) TABS Take 5-7 tablets by mouth daily.     glucose blood (ACCU-CHEK GUIDE) test strip Use to check blood fasting blood sugar daily  100 each 3   metFORMIN (GLUCOPHAGE-XR) 500 MG 24 hr tablet TAKE 2 TABLETS BY MOUTH IN THE MORNING AND AT BEDTIME 360 tablet 3   methocarbamol (ROBAXIN) 750 MG tablet Take 1 tablet (750 mg total) by mouth daily as needed for muscle spasms. 30 tablet 1   Multiple Vitamins-Minerals (ZINC PO) Take 1 tablet by mouth daily.     polyethylene glycol (MIRALAX / GLYCOLAX) 17 g packet Take 17 g by mouth daily.     rosuvastatin (CRESTOR) 20 MG tablet TAKE 1 TABLET BY MOUTH AT BEDTIME 90 tablet 3   sildenafil (REVATIO) 20 MG tablet Take 5 tablets prior to sexual activity 20 tablet 11   colchicine 0.6 MG tablet TAKE 1 TABLET BY MOUTH ONCE DAILY AS NEEDED 30 tablet 2   gabapentin (NEURONTIN) 400 MG capsule Take 400 mg by mouth 2 (two) times daily as needed.     hydrochlorothiazide (HYDRODIURIL) 25 MG tablet TAKE 1 TABLET BY MOUTH ONCE DAILY AS NEEDED 90 tablet 0   cyclobenzaprine (FLEXERIL) 10 MG tablet Take 1 tablet (10 mg total) by mouth 3 (three) times daily as needed for muscle spasms. 30 tablet 0   doxycycline (VIBRA-TABS) 100 MG tablet Take 1 tablet (100 mg total) by mouth 2 (two) times daily. 14 tablet 0   No facility-administered medications prior to visit.     Per HPI  unless specifically indicated in ROS section below Review of Systems  Constitutional:  Negative for fatigue and fever.  HENT:  Negative for ear pain.   Eyes:  Negative for pain.  Respiratory:  Negative for cough and shortness of breath.   Cardiovascular:  Negative for chest pain, palpitations and leg swelling.  Gastrointestinal:  Negative for abdominal pain.  Genitourinary:  Negative for dysuria.  Musculoskeletal:  Negative for arthralgias.  Neurological:  Negative for syncope, light-headedness and headaches.  Psychiatric/Behavioral:  Negative for dysphoric mood.    Objective:  BP 90/60   Pulse 81   Temp 98.4 F (36.9 C) (Oral)   Ht _0  (1.88 m)   Wt (!) 326 lb 4 oz (148 kg)   SpO2 93%   BMI 41.89 kg/m   Wt Readings  from Last 3 Encounters:  08/29/22 (!) 326 lb (147.9 kg)  08/18/22 (!) 326 lb 4 oz (148 kg)  05/19/22 (!) 332 lb 4 oz (150.7 kg)      Physical Exam Constitutional:      Appearance: He is well-developed.     Comments: In wheelchair  HENT:     Head: Normocephalic.     Right Ear: Hearing normal.     Left Ear: Hearing normal.     Nose: Nose normal.  Neck:     Thyroid: No thyroid mass or thyromegaly.     Vascular: No carotid bruit.     Trachea: Trachea normal.  Cardiovascular:     Rate and Rhythm: Normal rate and regular rhythm.     Pulses: Normal pulses.     Heart sounds: Heart sounds not distant. No murmur heard.    No friction rub. No gallop.     Comments:  Discolored bilateral legs with venous insufficiency Pulmonary:     Effort: Pulmonary effort is normal. No respiratory distress.     Breath sounds: Normal breath sounds.  Musculoskeletal:     Right lower leg: 1+ Pitting Edema present.     Left lower leg: 1+ Pitting Edema present.  Skin:    General: Skin is warm and dry.     Findings: No rash.          Comments: 1 cm area of indurated erythematous tissue under left pannus on abdominal wall with central pustule, draining  Psychiatric:        Speech: Speech normal.        Behavior: Behavior normal.        Thought Content: Thought content normal.       Diabetic foot exam: Normal inspection No skin breakdown Calluses  on heels Normal DP pulses Normal sensation to light touch and monofilament Nails thickened  Results for orders placed or performed in visit on 08/18/22  POCT glycosylated hemoglobin (Hb A1C)  Result Value Ref Range   Hemoglobin A1C 12.4 (A) 4.0 - 5.6 %   HbA1c POC (<> result, manual entry)     HbA1c, POC (prediabetic range)     HbA1c, POC (controlled diabetic range)       COVID 19 screen:  No recent travel or known exposure to COVID19 The patient denies respiratory symptoms of COVID 19 at this time. The importance of social distancing was  discussed today.   Assessment and Plan Type 2 diabetes mellitus with neurological complications [facility]) Assessment & Plan:  Chronic, inadequate control. Restart glipizide  XL 10 mg daily.. take with food.Marland Kitchen at least until we can get you set up with the pharmacist.  We will make  referral pharmacy to discuss coverage of Ozempic or other GLP1 meds  Orders: -     POCT glycosylated hemoglobin (Hb A1C) -     Ambulatory referral to Podiatry  Thickened nails Assessment & Plan: Chronic, secondary to DM.  Cannot cut nails.  Refer to podiatry.  Orders: -     Ambulatory referral to Podiatry  Other orders -     Colchicine; Take 1 tablet (0.6 mg total) by mouth daily as needed.  Dispense: 30 tablet; Refill: 2       [name], MD

## 2022-08-20 DIAGNOSIS — Z8673 Personal history of transient ischemic attack (TIA), and cerebral infarction without residual deficits: Secondary | ICD-10-CM | POA: Diagnosis not present

## 2022-08-20 DIAGNOSIS — G834 Cauda equina syndrome: Secondary | ICD-10-CM | POA: Diagnosis not present

## 2022-08-21 ENCOUNTER — Encounter: Payer: Self-pay | Admitting: Family Medicine

## 2022-08-22 NOTE — Telephone Encounter (Signed)
Agree with immediate office visit at [facility].

## 2022-08-22 NOTE — Telephone Encounter (Signed)
I spoke with pt;pt said area on buttock began to get sore on 08/19/22. Pt said he lives alone and cannot see if area is swollen or where the area is actually at. Pt said the entire area is so tender he cannot touch the area. Pt said the skin at top of buttock is extremely tight. Pt said about 1/2 hour ago the area has begun to drain. Pt cannot see what color the drainage is. Pt said has had cyst before on and off. Pt said once was a pilonidal cyst and pt had to have surgically removed. Pt is not sure if this cyst is in that area or not. Now that is draining the pain level is 4 - 5. No available appts at [facility] or [facility] and no available appts at [facility]. Pt said he will go to [facility] in [place]. Sending note to Dr Diona Browner and [facility] pool.

## 2022-08-24 ENCOUNTER — Telehealth: Payer: Self-pay | Admitting: Pharmacist

## 2022-08-24 ENCOUNTER — Ambulatory Visit: Payer: Medicare PPO | Admitting: Pharmacist

## 2022-08-24 ENCOUNTER — Other Ambulatory Visit: Payer: Self-pay | Admitting: Pharmacist

## 2022-08-24 DIAGNOSIS — E1149 Type 2 diabetes mellitus with other diabetic neurological complication: Secondary | ICD-10-CM

## 2022-08-24 DIAGNOSIS — E1159 Type 2 diabetes mellitus with other circulatory complications: Secondary | ICD-10-CM

## 2022-08-24 DIAGNOSIS — E1169 Type 2 diabetes mellitus with other specified complication: Secondary | ICD-10-CM

## 2022-08-24 MED ORDER — GABAPENTIN 400 MG PO CAPS: 400.0000 mg | ORAL_CAPSULE | Freq: Two times a day (BID) | ORAL | 3 refills | Status: DC | PRN

## 2022-08-24 NOTE — Progress Notes (Unsigned)
Chronic Care Management Pharmacy Note  08/31/2022 Name:  Jamie Finley MRN:  [id number] DOB:  06-26-1961  Summary: Initial visit -DM: A1c 12.4% (08/2022); discussed GLP-1 RA and pt agreed to start Ozempic; he will also switch from regular Cheerwine to diet  Recommendations/Changes made from today's visit: -Start Ozempic 0.25 mg weekly x 4 weeks, then increase to 0.5 mg as tolerated  Plan: -Pharmacist follow up televisit scheduled for 1 month    Subjective: Jamie Finley is an 61 y.o. year old male who is a primary patient of Bedsole, Amy E, MD.  The CCM team was consulted for assistance with disease management and care coordination needs.    Engaged with patient face to face for initial visit in response to provider referral for pharmacy case management and/or care coordination services.   Consent to Services:  The patient was given the following information about Chronic Care Management services today, agreed to services, and gave verbal consent: 1. CCM service includes personalized support from designated clinical staff supervised by the primary care provider, including individualized plan of care and coordination with other care providers 2. 24/7 contact phone numbers for assistance for urgent and routine care needs. 3. Service will only be billed when office clinical staff spend 20 minutes or more in a month to coordinate care. 4. Only one practitioner may furnish and bill the service in a calendar month. 5.The patient may stop CCM services at any time (effective at the end of the month) by phone call to the office staff. 6. The patient will be responsible for cost sharing (co-pay) of up to 20% of the service fee (after annual deductible is met). Patient agreed to services and consent obtained.  Patient Care Team: [name], MD as PCP - General (Family Medicine) Takari Haws, [facility] as Pharmacist (Pharmacist)  Recent office visits: 08/18/22 Dr Diona Browner OV: A1c 12.5%;  restart glipizide 10 mg. Pursue GLP-1 RA  05/19/22 Dr Diona Browner OV: A1c 9.2% off of glipizide/Actos. F/u 3 months.  Recent consult visits: 04/17/22 Nutrition - diabetes counseling.  Hospital visits: None in previous 6 months   Objective:  Lab Results  Component Value Date   CREATININE 1.21 05/11/2022   BUN 13 05/11/2022   GFR 64.62 05/11/2022   GFRNONAA >60 02/11/2022   GFRAA >60 02/11/2019   NA 134 (L) 05/11/2022   K 3.5 05/11/2022   CALCIUM 9.4 05/11/2022   CO2 31 05/11/2022   GLUCOSE 248 (H) 05/11/2022    Lab Results  Component Value Date/Time   HGBA1C 12.4 (A) 08/18/2022 10:42 AM   HGBA1C 9.2 (H) 05/11/2022 09:26 AM   HGBA1C 5.7 (A) 02/16/2022 03:03 PM   HGBA1C 6.7 (H) 11/09/2021 10:06 AM   GFR 64.62 05/11/2022 09:26 AM   GFR 76.30 04/26/2021 09:53 AM   MICROALBUR 2.7 (H) 05/11/2022 04:57 PM   MICROALBUR <0.7 02/21/2019 09:45 AM    Last diabetic Eye exam:  Lab Results  Component Value Date/Time   HMDIABEYEEXA No Retinopathy 02/22/2022 12:00 AM    Last diabetic Foot exam:  Lab Results  Component Value Date/Time   HMDIABFOOTEX done 07/18/2021 12:00 AM     Lab Results  Component Value Date   CHOL 149 05/11/2022   HDL 40.30 05/11/2022   LDLCALC 62 03/02/2020   LDLDIRECT 81.0 05/11/2022   TRIG 236.0 (H) 05/11/2022   CHOLHDL 4 05/11/2022       Latest Ref Rng & Units 05/11/2022    9:26 AM 07/13/2021    4:09 AM  04/26/2021    9:53 AM  Hepatic Function  Total Protein 6.0 - 8.3 g/dL 7.5  8.0  7.5   Albumin 3.5 - 5.2 g/dL 4.0  4.2  4.2   AST 0 - 37 U/L _0 ALT 0 - 53 U/L _1 Alk Phosphatase 39 - 117 U/L 77  62  54   Total Bilirubin 0.2 - 1.2 mg/dL 0.8  1.1  0.8     Lab Results  Component Value Date/Time   TSH 6.680 (H) 08/28/2022 02:47 PM   TSH 4.22 07/17/2017 11:34 AM       Latest Ref Rng & Units 02/11/2022    7:53 PM 07/13/2021    4:09 AM 06/04/2021    6:35 AM  CBC  WBC 4.0 - 10.5 K/uL 8.5  10.1  8.0   Hemoglobin 13.0 - 17.0 g/dL  13.3  13.2  13.3   Hematocrit 39.0 - 52.0 % 41.8  38.4  39.1   Platelets 150 - 400 K/uL 258  277  245     Lab Results  Component Value Date/Time   VD25OH 63 07/17/2017 11:34 AM   VD25OH 62 06/13/2017 03:12 PM   VITAMINB12 351 06/13/2017 03:12 PM    Clinical ASCVD: Yes  The ASCVD Risk score (Arnett DK, et al., 2019) failed to calculate for the following reasons:   The systolic blood pressure is missing       08/29/2022    2:54 PM 08/18/2022   11:13 AM 03/16/2022    1:28 PM  Depression screen PHQ 2/9  Decreased Interest 0 1 0  Down, Depressed, Hopeless 1 2 0  PHQ - 2 Score 1 3 0  Altered sleeping 1 3   Tired, decreased energy 1 1   Change in appetite 0 3   Feeling bad or failure about yourself  1 3   Trouble concentrating 0 2   Moving slowly or fidgety/restless 0 0   Suicidal thoughts 0 1   PHQ-9 Score 4 16   Difficult doing work/chores  Very difficult      Social History   Tobacco Use  Smoking Status Former   Packs/day: 1.00   Years: 5.00   Total pack years: 5.00   Types: Cigarettes   Quit date: 09/11/1989   Years since quitting: 32.9  Smokeless Tobacco Never   BP Readings from Last 3 Encounters:  08/29/22 100/70  08/28/22 120/78  08/18/22 90/60   Pulse Readings from Last 3 Encounters:  08/29/22 78  08/28/22 71  08/18/22 81   Wt Readings from Last 3 Encounters:  08/29/22 (!) 326 lb (147.9 kg)  08/18/22 (!) 326 lb 4 oz (148 kg)  05/19/22 (!) 332 lb 4 oz (150.7 kg)   BMI Readings from Last 3 Encounters:  08/29/22 41.86 kg/m  08/29/22 41.89 kg/m  08/18/22 41.89 kg/m    Assessment/Interventions: Review of patient past medical history, allergies, medications, health status, including review of consultants reports, laboratory and other test data, was performed as part of comprehensive evaluation and provision of chronic care management services.   SDOH:  (Social Determinants of Health) assessments and interventions performed: Yes SDOH Interventions     Flowsheet Row Chronic Care Management from 08/24/2022 in [place] at [facility] Visit from 02/16/2022 in [place] at [place] Interventions    Food Insecurity Interventions AMB Referral  [coordinate referral for SDOH] --  Housing Interventions Intervention Not Indicated --  Transportation Interventions Intervention Not Indicated --  Depression Interventions/Treatment  -- Counseling  Financial Strain Interventions Intervention Not Indicated  [patient has Medicare/Medicaid] --  Physical Activity Interventions Other (Comments)  [patient is wheelchair bound] --      [place]: No Food Insecurity (08/29/2022)  Housing: Low Risk  (08/29/2022)  Transportation Needs: No Transportation Needs (08/29/2022)  Utilities: Not At Risk (08/29/2022)  Alcohol Screen: Low Risk  (08/29/2022)  Depression (PHQ2-9): Low Risk  (08/29/2022)  Recent Concern: Depression (PHQ2-9) - High Risk (08/18/2022)  Financial Resource Strain: Low Risk  (08/29/2022)  Recent Concern: Financial Resource Strain - Medium Risk (08/24/2022)  Physical Activity: Inactive (08/29/2022)  Social Connections: Moderately Isolated (08/29/2022)  Stress: No Stress Concern Present (08/29/2022)  Tobacco Use: Medium Risk (08/29/2022)    CCM Care Plan  No Known Allergies  Medications Reviewed Today     Reviewed by Dionisio David, LPN (Licensed Practical Nurse) on 08/29/22 at [place] List Status: <None>   Medication Order Taking? Sig Documenting Provider Last Dose Status Informant  Accu-Chek Softclix Lancets lancets [id number] Yes Use to check fasting blood sugar daily Bedsole, Amy E, MD Taking Active   acetaminophen (TYLENOL) 325 MG tablet [id number] Yes Take 325 mg by mouth every 4 (four) hours as needed.  [provider] Taking Active   aspirin 81 MG tablet [id number] Yes Take 81 mg by mouth daily. Roselee Nova, MD Taking Active Self  blood glucose meter kit  and supplies [id number] Yes Dispense based on patient and insurance preference. Use to check blood sugar two times a day. [name], MD Taking Active   Blood Glucose Monitoring Suppl (ACCU-CHEK GUIDE) w/Device KIT [id number] Yes Use to check blood fasting blood sugar daily Bedsole, Amy E, MD Taking Active   Cholecalciferol (VITAMIN D3) 50 MCG (2000 UT) TABS [id number] Yes Take 5-7 tablets by mouth daily. [provider] Taking Active   colchicine 0.6 MG tablet [id number] Yes Take 1 tablet (0.6 mg total) by mouth daily as needed. [name], MD Taking Active   doxycycline (VIBRA-TABS) 100 MG tablet [id number] Yes Take 1 tablet (100 mg total) by mouth 2 (two) times daily. [name], MD Taking Active   gabapentin (NEURONTIN) 400 MG capsule [id number] Yes Take 1 capsule (400 mg total) by mouth 2 (two) times daily as needed. [name], MD Taking Active   glucose blood (ACCU-CHEK GUIDE) test strip [id number] Yes Use to check blood fasting blood sugar daily Bedsole, Amy E, MD Taking Active   hydrochlorothiazide (HYDRODIURIL) 25 MG tablet [id number] Yes TAKE 1 TABLET BY MOUTH ONCE DAILY AS NEEDED Bedsole, Amy E, MD Taking Active            Med Note Dionisio David   Tue Aug 29, 2022  2:52 PM) Dewaine Conger every a.m.  metFORMIN (GLUCOPHAGE-XR) 500 MG 24 hr tablet [id number] Yes TAKE 2 TABLETS BY MOUTH IN THE MORNING AND AT BEDTIME Bedsole, Amy E, MD Taking Active   methocarbamol (ROBAXIN) 750 MG tablet [id number] Yes Take 1 tablet (750 mg total) by mouth daily as needed for muscle spasms. [name], MD Taking Active   Multiple Vitamins-Minerals (ZINC PO) [id number] Yes Take 1 tablet by mouth daily. [provider] Taking Active   Plecanatide (TRULANCE) 3 MG TABS [id number] Yes Take 1 tablet by mouth daily before breakfast. [provider] Taking Active   polyethylene glycol (MIRALAX / GLYCOLAX) 17 g packet [id number] Yes Take 17  g by mouth daily. [provider] Taking  Active   rosuvastatin (CRESTOR) 20 MG tablet [id number] Yes TAKE 1 TABLET BY MOUTH AT BEDTIME Bedsole, Amy E, MD Taking Active   Semaglutide,0.25 or 0.5MG/DOS, (OZEMPIC, 0.25 OR 0.5 MG/DOSE,) 2 MG/3ML SOPN [id number] Yes 0.25 mg injection weekly  x 4 weeks, then increase to 0.5 mg weekly thereafter. [name], MD Taking Active   sildenafil (REVATIO) 20 MG tablet [id number] Yes Take 5 tablets prior to sexual activity [name], MD Taking Active             Patient Active Problem List   Diagnosis Date Noted   Abscess, gluteal cleft 08/29/2022   Cutaneous abscess of abdominal wall 05/19/2022   Dysthymia 05/03/2021   Decreased mobility of joint 01/28/2021   Peripheral edema 01/05/2021   Erectile dysfunction 07/23/2020   Cardiomegaly 02/25/2019   History of DVT (deep vein thrombosis) 12/24/2018   Hx of Cauda equina syndrome with neurogenic bladder ([place]) 12/10/2018   Chronic constipation 12/10/2018   Acute idiopathic gout of right foot 09/25/2017   Bilateral knee pain 08/24/2017   MDD (major depressive disorder), recurrent episode, moderate ([place]) 08/24/2017   Acute midline low back pain without sciatica 02/27/2017   History of CVA (cerebrovascular accident) 01/15/2016   Hyperlipidemia associated with type 2 diabetes mellitus ([place]) 02/18/2015   Peripheral autonomic neuropathy due to diabetes mellitus ([place]) 02/18/2015   Type 2 diabetes mellitus with neurological complications ([place]) 96/28/3662   Hypertension associated with diabetes ([place]) 12/15/2014   Morbid obesity ([place]) 12/15/2014    Immunization History  Administered Date(s) Administered   Moderna Sars-Covid-2 Vaccination 05/03/2020, 06/07/2020    Conditions to be addressed/monitored:  Hypertension, Hyperlipidemia, and Diabetes  Care Plan : [place]  Updates made by Antuane Haws, [place] since 08/31/2022 12:00 AM     Problem: Hypertension, Hyperlipidemia, and Diabetes   Priority: High      Long-Range Goal: Disease mgmt   Start Date: 08/31/2022  Expected End Date: 09/01/2023  This Visit's Progress: On track  Priority: High  Note:   Current Barriers:  Unable to achieve control of DM   Pharmacist Clinical Goal(s):  Patient will achieve control of DM as evidenced by A1c < 8% through collaboration with PharmD and provider.   Interventions: 1:1 collaboration with [name], MD regarding development and update of comprehensive plan of care as evidenced by provider attestation and co-signature Inter-disciplinary care team collaboration (see longitudinal plan of care) Comprehensive medication review performed; medication list updated in electronic medical record  Hypertension (BP goal <130/80) -Controlled - BP low end of normal sometimes -Current treatment: HCTZ 25 mg daily PRN -Medications previously tried: n/a  -Denies hypotensive/hypertensive symptoms -Counseled to monitor BP at home periodically -Recommended to continue current medication  Hyperlipidemia: (LDL goal < 70) -Not ideally controlled - LDL 81 (04/2022) above goal -Hx CVA, previously on clopiodgrel -Current treatment: Rosuvastatin 20 mg daily Aspirin 81 mg daily -Medications previously tried: n/a -Educated on Cholesterol goals; Benefits of statin for ASCVD risk reduction; Importance of limiting foods high in cholesterol; -Recommended to continue current medication; consider ezetimibe in future  Diabetes (A1c goal <7%) -Uncontrolled - A1c 12.4%, up from 9.2% after stopping glipziide/Actos. -DM Dx ~2016 -Current home glucose readings: none available -Denies hypoglycemic/hyperglycemic symptoms -Current medications: Metformin ER 500 mg - 2 tab BID - Appropriate, Query Effective Glipizide XL 10 mg daily -Appropriate, Query Effective Testing supplies -Medications previously tried: Actos, glipizide   -Current meal  patterns: in wheelchair he cannot reach stove to cook, so he eats out often (fat food);  drinks: 3 Cheerwines per day -Current exercise: none (wheelchair bound) -Educated on A1c and blood sugar goals; Complications of diabetes including kidney damage, retinal damage, and cardiovascular disease; Benefits of weight loss;  -Discussed internal processes that regulate blood sugar (ie, why sugar can be elevated without eating anything); discussed importance of dietary changes to improve A1c -Reviewed GLP-1 RA class and benefits/side effects; pt would benefit from GLP-1 RA weight loss and CAD prevention as well; demonstrated how to use Ozempic pen using demo device; discussed nausea is most common side effect, discussed diet changes can mitigate nausea -Recommend to start Ozempic 0.25 mg weekly x 4 weeks, then increase to 0.5 mg weekly -Advised to switch Cheerwine to Diet version  Health Maintenance -Vaccine gaps: TDAP, Shingrix -Hx DVT, 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  Patient Goals/Self-Care Activities Patient will:  - take medications as prescribed as evidenced by patient report and record review focus on medication adherence by routine check glucose daily, document, and provide at future appointments engage in dietary modifications by reducing carbs/sweets       Medication Assistance: None required.  Patient affirms current coverage meets needs. (Pt has Medicare/Medicaid dual coverage)  Compliance/Adherence/Medication fill history: Care Gaps: Foot exam (due 07/18/22) AWV (due 08/09/22)  Star-Rating Drugs: Rosuvastatin - PDC 100% Metformin - PDC 100%  Medication Access: Within the past 30 days, how often has patient missed a dose of medication? 0 Is a pillbox or other method used to improve adherence? No  Factors that may affect medication adherence? financial need Are meds synced by current pharmacy? No  Are meds delivered by current pharmacy? No  Does patient experience delays in picking up medications due to transportation concerns? No   Upstream Services Reviewed: Is  patient disadvantaged to use [facility]?: No  Current Rx insurance plan: [employer]/Medicaid Name and location of Current pharmacy:  [facility] [address] ([address]), [place]) [place] [address] Phone: [phone] Fax: [phone]  [facility] services reviewed with patient today?: No  Patient requests to transfer care to [facility] Pharmacy?: No  Reason patient declined to change pharmacies: Not mentioned at this visit   Care Plan and Follow Up Patient Decision:  Patient agrees to Care Plan and Follow-up.  Plan: Telephone follow up appointment with care management team member scheduled for:  1 month  Charlene Brooke, PharmD, [facility] Clinical Pharmacist [facility] at [facility] [phone]

## 2022-08-24 NOTE — Telephone Encounter (Signed)
Discussed diabetes options with patient. Ozempic would be a great option to help with DM control and weight loss. Discussed Ozempic benefits/side effects at length, pt would like to start medication. Also discussed diet improvements like switching Cheerwine (3 per day) to diet. He is also interested to hear from a care guide regading healthy meal resources (Meals on Wheels or similar).  Patient has Medicare/Medicaid dual coverage so brand name drugs should not be more than ~$11 per month, and pt reports this would be reasonable for him. If he has cost issues patient was advised to contact PharmD directly.   Recommendation: -Start Ozempic 0.25 mg x 4 weeks, then increase to 0.5 mg weekly thereafter. May titrate dose up every 4 weeks as needed. -Refer to [facility] care guide for meal resources  Preferred pharmacy: [facility] [address] ([address]), [place] [address] [place]) [place] [address] Phone: [phone] Fax: [phone]

## 2022-08-24 NOTE — Telephone Encounter (Signed)
Patient requests refill for gabapentin 400 mg.   Last eRx 11/15/21 #60 with 3 refills.  Pharmacy: [facility] [address] ([address]), [place] [address] [place] [address] Phone: [phone] Fax: [phone]

## 2022-08-25 MED ORDER — OZEMPIC (0.25 OR 0.5 MG/DOSE) 2 MG/3ML ~~LOC~~ SOPN: PEN_INJECTOR | SUBCUTANEOUS | 11 refills | Status: DC

## 2022-08-28 ENCOUNTER — Ambulatory Visit: Payer: Medicare PPO | Admitting: Gastroenterology

## 2022-08-28 ENCOUNTER — Encounter: Payer: Self-pay | Admitting: Gastroenterology

## 2022-08-28 ENCOUNTER — Ambulatory Visit (INDEPENDENT_AMBULATORY_CARE_PROVIDER_SITE_OTHER): Payer: Medicare PPO | Admitting: Gastroenterology

## 2022-08-28 VITALS — BP 120/78 | HR 71 | Temp 98.7°F

## 2022-08-28 DIAGNOSIS — K5909 Other constipation: Secondary | ICD-10-CM | POA: Diagnosis not present

## 2022-08-28 NOTE — Patient Instructions (Signed)
Please let us know if Trulance works for you so Dr. Vicente Males could write you a prescription.

## 2022-08-28 NOTE — Progress Notes (Signed)
Jonathon Bellows MD, MRCP(U.K) [facility]  [place], [place] [address]  Main: [phone]  Fax: [phone]   Gastroenterology Consultation  Referring Provider:     [name], MD Primary Care Physician:  [name], MD Primary Gastroenterologist:  Dr. Jonathon Bellows  Reason for Consultation:     Chronic constipation        HPI:   Jamie Finley is a 61 y.o. y/o male referred for consultation & management  by Dr. Diona Browner, Mervyn Gay, MD.    He says he is always suffer from chronic constipation but recently has been a bit worse when he passes stool consistency of pebbles, due to his back surgery has difficulty straining.  Tried over-the-counter MiraLAX not helped completely has added some Benefiber in his diet.  No blood in the stool.  Commenced on Ozempic recently He has been referred for const patient: 01/28/2020 colonoscopy: 4 sessile polyps subcentimeter resected there were tubular adenomas.  For this procedure 2 he had difficulty doing the bowel prep he had to come on 2 days for the same.  05/11/2022: HbA1c 9.2 no recent TSH.    Past Medical History:  Diagnosis Date   Arthritis    Depression    Diabetes mellitus without complication ([place])    Frequent headaches    Gout    Hyperlipidemia    Hypertension    Stroke [facility])     Past Surgical History:  Procedure Laterality Date   BACK SURGERY  2007   [facility]   COLONOSCOPY WITH PROPOFOL N/A 01/27/2020   Procedure: COLONOSCOPY WITH PROPOFOL;  Surgeon: Jonathon Bellows, MD;  [facility] ENDOSCOPY;  Service: Gastroenterology;  Laterality: N/A;   COLONOSCOPY WITH PROPOFOL N/A 01/28/2020   Procedure: COLONOSCOPY WITH PROPOFOL;  Surgeon: Jonathon Bellows, MD;  Location: [facility] ENDOSCOPY;  Service: Gastroenterology;  Laterality: N/A;   TONSILLECTOMY      Prior to Admission medications   Medication Sig Start Date End Date Taking? Authorizing Provider  Accu-Chek Softclix Lancets lancets Use to check fasting blood sugar daily  03/01/21   Bedsole, Amy E, MD  acetaminophen (TYLENOL) 325 MG tablet Take 325 mg by mouth every 4 (four) hours as needed.     [provider]  aspirin 81 MG tablet Take 81 mg by mouth daily.    Roselee Nova, MD  blood glucose meter kit and supplies Dispense based on patient and insurance preference. Use to check blood sugar two times a day. 12/12/18   Bedsole, Amy E, MD  Blood Glucose Monitoring Suppl (ACCU-CHEK GUIDE) w/Device KIT Use to check blood fasting blood sugar daily 03/01/21   Bedsole, Amy E, MD  Cholecalciferol (VITAMIN D3) 50 MCG (2000 UT) TABS Take 5-7 tablets by mouth daily.    [provider]  colchicine 0.6 MG tablet Take 1 tablet (0.6 mg total) by mouth daily as needed. 08/18/22   Bedsole, Amy E, MD  gabapentin (NEURONTIN) 400 MG capsule Take 1 capsule (400 mg total) by mouth 2 (two) times daily as needed. 08/24/22   Bedsole, Amy E, MD  glucose blood (ACCU-CHEK GUIDE) test strip Use to check blood fasting blood sugar daily 03/01/21   Bedsole, Amy E, MD  hydrochlorothiazide (HYDRODIURIL) 25 MG tablet TAKE 1 TABLET BY MOUTH ONCE DAILY AS NEEDED 06/20/22   Bedsole, Amy E, MD  metFORMIN (GLUCOPHAGE-XR) 500 MG 24 hr tablet TAKE 2 TABLETS BY MOUTH IN THE MORNING AND AT BEDTIME 06/06/22   Bedsole, Amy E,  MD  methocarbamol (ROBAXIN) 750 MG tablet Take 1 tablet (750 mg total) by mouth daily as needed for muscle spasms. 08/09/21   Bedsole, Amy E, MD  Multiple Vitamins-Minerals (ZINC PO) Take 1 tablet by mouth daily.    [provider]  polyethylene glycol (MIRALAX / GLYCOLAX) 17 g packet Take 17 g by mouth daily.    [provider]  rosuvastatin (CRESTOR) 20 MG tablet TAKE 1 TABLET BY MOUTH AT BEDTIME 08/01/22   Bedsole, Amy E, MD  Semaglutide,0.25 or 0.5MG/DOS, (OZEMPIC, 0.25 OR 0.5 MG/DOSE,) 2 MG/3ML SOPN 0.25 mg injection weekly  x 4 weeks, then increase to 0.5 mg weekly thereafter. 08/25/22   [name], MD  sildenafil (REVATIO) 20 MG tablet Take 5  tablets prior to sexual activity 01/23/22   [name], MD    Family History  Problem Relation Age of Onset   Cancer Mother        face and jaw   Cancer Father        kidney   Early death Father    Diabetes Maternal Grandmother    Depression Maternal Grandfather    Diabetes Maternal Grandfather    Hearing loss Maternal Grandfather      Social History   Tobacco Use   Smoking status: Former    Packs/day: 1.00    Years: 5.00    Total pack years: 5.00    Types: Cigarettes    Quit date: 09/11/1989    Years since quitting: 32.9   Smokeless tobacco: Never  Vaping Use   Vaping Use: Never used  Substance Use Topics   Alcohol use: Yes    Comment: rarely    Drug use: No    Allergies as of 08/28/2022   (No Known Allergies)    Review of Systems:    All systems reviewed and negative except where noted in HPI.   Physical Exam:  BP 120/78   Pulse 71   Temp 98.7 F (37.1 C) (Oral)  No LMP for male patient. Psych:  Alert and cooperative. Normal mood and affect. General:   Alert,  Well-developed, well-nourished, pleasant and cooperative in NAD\   Neurologic:  Alert and oriented x3;  grossly normal neurologically. Psych:  Alert and cooperative. Normal mood and affect.  Imaging Studies: No results found.  Assessment and Plan:   Jamie Finley is a 61 y.o. y/o male has been referred for constipation, HbA1c 9.2.  Even during his colonoscopy in 2021 he had difficulty doing the bowel prep as he was not cleaned out had to repeat the prep on the second day.  Likely chronic constipation or combination due to diabetic neuropathy as well as related to his back problem  Plan 1.  Plan to increase dietary fiber to 35 g of fiber per day patient information provided 2.  Commence on Trulance samples 2 weeks provided if it helps he will call me for prescription 3.  Check TSH  Follow up in 4 weeks to discuss how he is doing and change therapy if required  Dr Jonathon Bellows  MD,MRCP(U.K)

## 2022-08-29 ENCOUNTER — Ambulatory Visit: Payer: Medicare PPO | Admitting: Family Medicine

## 2022-08-29 ENCOUNTER — Encounter: Payer: Self-pay | Admitting: Family Medicine

## 2022-08-29 ENCOUNTER — Ambulatory Visit (INDEPENDENT_AMBULATORY_CARE_PROVIDER_SITE_OTHER): Payer: Medicare PPO

## 2022-08-29 VITALS — BP 100/70 | HR 78 | Temp 97.9°F | Ht 74.0 in

## 2022-08-29 VITALS — Wt 326.0 lb

## 2022-08-29 DIAGNOSIS — L0231 Cutaneous abscess of buttock: Secondary | ICD-10-CM

## 2022-08-29 DIAGNOSIS — Z0000 Encounter for general adult medical examination without abnormal findings: Secondary | ICD-10-CM | POA: Diagnosis not present

## 2022-08-29 DIAGNOSIS — E1149 Type 2 diabetes mellitus with other diabetic neurological complication: Secondary | ICD-10-CM | POA: Diagnosis not present

## 2022-08-29 LAB — TSH: TSH: 6.68 u[IU]/mL — ABNORMAL HIGH (ref 0.450–4.500)

## 2022-08-29 MED ORDER — DOXYCYCLINE HYCLATE 100 MG PO TABS: 100.0000 mg | ORAL_TABLET | Freq: Two times a day (BID) | ORAL | 0 refills | Status: DC

## 2022-08-29 NOTE — Assessment & Plan Note (Signed)
Acute, will treat with warm compresses.  Wound culture sent.  Treat with doxycycline 100 mg p.o. twice daily given diabetes and history of abscess, unknown if past MRSA infection. He will follow closely and let me know if it does not improve as expected. ER and return precautions provided in detail.

## 2022-08-29 NOTE — Patient Instructions (Signed)
We will call with culture.  Warm compresses three times daily.  Complete antibiotics doxycycline 100 mg  twice daily

## 2022-08-29 NOTE — Progress Notes (Signed)
Patient ID: Jamie Finley, male    DOB: Mar 18, 1961, 61 y.o.   MRN: [id number]  This visit was conducted in person.  BP 100/70   Pulse 78   Temp 97.9 F (36.6 C) (Oral)   Ht _0  (1.88 m)   SpO2 95%   BMI 41.89 kg/m    CC:  Chief Complaint  Patient presents with   Area on Buttocks    "Top of Butt Crack"    Subjective:   HPI: Jamie Finley is a 61 y.o. male presenting on 08/29/2022 for Area on Buttocks ("Top of Butt Crack")   In the last  he has noted sore area 1 week ago.  Felt tenderness that increased.  Seems to possibly improving at this time. He has noted wetness there... pus like discharge.   No fever. No flu like symptoms.   He has history of abscess in groin area.  No history of MRSA.  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     Relevant past medical, surgicl, family and social history reviewed and updated as indicated. Interim medical history since our last visit reviewed. Allergies and medications reviewed and updated. Outpatient Medications Prior to Visit  Medication Sig Dispense Refill   Accu-Chek Softclix Lancets lancets Use to check fasting blood sugar daily 100 each 3   acetaminophen (TYLENOL) 325 MG tablet Take 325 mg by mouth every 4 (four) hours as needed.      aspirin 81 MG tablet Take 81 mg by mouth daily.     blood glucose meter kit and supplies Dispense based on patient and insurance preference. Use to check blood sugar two times a day. 1 each 0   Blood Glucose Monitoring Suppl (ACCU-CHEK GUIDE) w/Device KIT Use to check blood fasting blood sugar daily 1 kit 0   Cholecalciferol (VITAMIN D3) 50 MCG (2000 UT) TABS Take 5-7 tablets by mouth daily.     colchicine 0.6 MG tablet Take 1 tablet (0.6 mg total) by mouth daily as needed. 30 tablet 2   gabapentin (NEURONTIN) 400 MG capsule Take 1 capsule (400 mg total) by mouth 2 (two) times daily as needed. 60 capsule 3   glucose blood (ACCU-CHEK GUIDE) test strip Use to check blood fasting blood sugar daily  100 each 3   hydrochlorothiazide (HYDRODIURIL) 25 MG tablet TAKE 1 TABLET BY MOUTH ONCE DAILY AS NEEDED 90 tablet 0   metFORMIN (GLUCOPHAGE-XR) 500 MG 24 hr tablet TAKE 2 TABLETS BY MOUTH IN THE MORNING AND AT BEDTIME 360 tablet 3   methocarbamol (ROBAXIN) 750 MG tablet Take 1 tablet (750 mg total) by mouth daily as needed for muscle spasms. 30 tablet 1   Multiple Vitamins-Minerals (ZINC PO) Take 1 tablet by mouth daily.     Plecanatide (TRULANCE) 3 MG TABS Take 1 tablet by mouth daily before breakfast.     polyethylene glycol (MIRALAX / GLYCOLAX) 17 g packet Take 17 g by mouth daily.     rosuvastatin (CRESTOR) 20 MG tablet TAKE 1 TABLET BY MOUTH AT BEDTIME 90 tablet 3   Semaglutide,0.25 or 0.5MG/DOS, (OZEMPIC, 0.25 OR 0.5 MG/DOSE,) 2 MG/3ML SOPN 0.25 mg injection weekly  x 4 weeks, then increase to 0.5 mg weekly thereafter. 3 mL 11   sildenafil (REVATIO) 20 MG tablet Take 5 tablets prior to sexual activity 20 tablet 11   No facility-administered medications prior to visit.     Per HPI unless specifically indicated in ROS section below Review of Systems  Constitutional:  Negative for  fatigue and fever.  HENT:  Negative for ear pain.   Eyes:  Negative for pain.  Respiratory:  Negative for cough and shortness of breath.   Cardiovascular:  Negative for chest pain, palpitations and leg swelling.  Gastrointestinal:  Negative for abdominal pain.  Genitourinary:  Negative for dysuria.  Musculoskeletal:  Negative for arthralgias.  Neurological:  Negative for syncope, light-headedness and headaches.  Psychiatric/Behavioral:  Negative for dysphoric mood.    Objective:  BP 100/70   Pulse 78   Temp 97.9 F (36.6 C) (Oral)   Ht _0  (1.88 m)   SpO2 95%   BMI 41.89 kg/m   Wt Readings from Last 3 Encounters:  08/18/22 (!) 326 lb 4 oz (148 kg)  05/19/22 (!) 332 lb 4 oz (150.7 kg)  04/17/22 (!) 332 lb 9.6 oz (150.9 kg)      Physical Exam Constitutional:      Appearance: He is  well-developed.  HENT:     Head: Normocephalic.     Right Ear: Hearing normal.     Left Ear: Hearing normal.     Nose: Nose normal.  Neck:     Thyroid: No thyroid mass or thyromegaly.     Vascular: No carotid bruit.     Trachea: Trachea normal.  Cardiovascular:     Rate and Rhythm: Normal rate and regular rhythm.     Pulses: Normal pulses.     Heart sounds: Heart sounds not distant. No murmur heard.    No friction rub. No gallop.     Comments: No peripheral edema Pulmonary:     Effort: Pulmonary effort is normal. No respiratory distress.     Breath sounds: Normal breath sounds.  Skin:    General: Skin is warm and dry.     Findings: No rash.     Comments: 2 pustules with firm skin behind, mild fluctuance with minimal erythema top of gluteal crease on left buttock  Psychiatric:        Speech: Speech normal.        Behavior: Behavior normal.        Thought Content: Thought content normal.       Results for orders placed or performed in visit on 08/28/22  TSH  Result Value Ref Range   TSH 6.680 (H) 0.450 - 4.500 uIU/mL     COVID 19 screen:  No recent travel or known exposure to [place] The patient denies respiratory symptoms of COVID 19 at this time. The importance of social distancing was discussed today.   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  Wound culture sent.  Treat with doxycycline 100 mg p.o. twice daily given diabetes and history of abscess, unknown if past MRSA infection. He will follow closely and let me know if it does not improve as expected. ER and return precautions provided in detail.      Relevant Orders   WOUND CULTURE   Meds ordered this encounter  Medications   doxycycline (VIBRA-TABS) 100 MG tablet    Sig: Take 1 tablet (100 mg total) by mouth 2 (two) times daily.     Dispense:  20 tablet    Refill:  0     [name], MD

## 2022-08-29 NOTE — Progress Notes (Signed)
Maritza inform his thyroid is abnormal get Free t4/t3 - a low thyroid can cause constipation   Dr Jonathon Bellows MD,MRCP [facility]) Gastroenterology/Hepatology Pager: [phone]

## 2022-08-29 NOTE — Patient Instructions (Signed)
Jamie Finley , Thank you for taking time to come for your Medicare Wellness Visit. I appreciate your ongoing commitment to your health goals. Please review the following plan we discussed and let me know if I can assist you in the future.   Screening recommendations/referrals: Colonoscopy: 01/28/20 Recommended yearly ophthalmology/optometry visit for glaucoma screening and checkup Recommended yearly dental visit for hygiene and checkup  Vaccinations: Influenza vaccine: n/d Pneumococcal vaccine: n/d Tdap vaccine: n/d Shingles vaccine: n/d   Covid-19: 05/03/20, 06/07/20  Advanced directives: no  Conditions/risks identified: none  Next appointment: Follow up in one year for your annual wellness visit 08/31/23 @ 10:15 am by phone  Preventive Care 40-64 Years, Male Preventive care refers to lifestyle choices and visits with your health care provider that can promote health and wellness. What does preventive care include? A yearly physical exam. This is also called an annual well check. Dental exams once or twice a year. Routine eye exams. Ask your health care provider how often you should have your eyes checked. Personal lifestyle choices, including: Daily care of your teeth and gums. Regular physical activity. Eating a healthy diet. Avoiding tobacco and drug use. Limiting alcohol use. Practicing safe sex. Taking low-dose aspirin every day starting at age 49. What happens during an annual well check? The services and screenings done by your health care provider during your annual well check will depend on your age, overall health, lifestyle risk factors, and family history of disease. Counseling  Your health care provider may ask you questions about your: Alcohol use. Tobacco use. Drug use. Emotional well-being. Home and relationship well-being. Sexual activity. Eating habits. Work and work Statistician. Screening  You may have the following tests or measurements: Height,  weight, and BMI. Blood pressure. Lipid and cholesterol levels. These may be checked every 5 years, or more frequently if you are over 8 years old. Skin check. Lung cancer screening. You may have this screening every year starting at age 20 if you have a 30-pack-year history of smoking and currently smoke or have quit within the past 15 years. Fecal occult blood test (FOBT) of the stool. You may have this test every year starting at age 31. Flexible sigmoidoscopy or colonoscopy. You may have a sigmoidoscopy every 5 years or a colonoscopy every 10 years starting at age 48. Prostate cancer screening. Recommendations will vary depending on your family history and other risks. Hepatitis C blood test. Hepatitis B blood test. Sexually transmitted disease (STD) testing. Diabetes screening. This is done by checking your blood sugar (glucose) after you have not eaten for a while (fasting). You may have this done every 1-3 years. Discuss your test results, treatment options, and if necessary, the need for more tests with your health care provider. Vaccines  Your health care provider may recommend certain vaccines, such as: Influenza vaccine. This is recommended every year. Tetanus, diphtheria, and acellular pertussis (Tdap, Td) vaccine. You may need a Td booster every 10 years. Zoster vaccine. You may need this after age 14. Pneumococcal 13-valent conjugate (PCV13) vaccine. You may need this if you have certain conditions and have not been vaccinated. Pneumococcal polysaccharide (PPSV23) vaccine. You may need one or two doses if you smoke cigarettes or if you have certain conditions. Talk to your health care provider about which screenings and vaccines you need and how often you need them. This information is not intended to replace advice given to you by your health care provider. Make sure you discuss any questions you have  with your health care provider. Document Released: 09/24/2015 Document Revised:  05/17/2016 Document Reviewed: 06/29/2015 Elsevier Interactive Patient Education  2017 [employer] Prevention in the Home Falls can cause injuries. They can happen to people of all ages. There are many things you can do to make your home safe and to help prevent falls. What can I do on the outside of my home? Regularly fix the edges of walkways and driveways and fix any cracks. Remove anything that might make you trip as you walk through a door, such as a raised step or threshold. Trim any bushes or trees on the path to your home. Use bright outdoor lighting. Clear any walking paths of anything that might make someone trip, such as rocks or tools. Regularly check to see if handrails are loose or broken. Make sure that both sides of any steps have handrails. Any raised decks and porches should have guardrails on the edges. Have any leaves, snow, or ice cleared regularly. Use sand or salt on walking paths during winter. Clean up any spills in your garage right away. This includes oil or grease spills. What can I do in the bathroom? Use night lights. Install grab bars by the toilet and in the tub and shower. Do not use towel bars as grab bars. Use non-skid mats or decals in the tub or shower. If you need to sit down in the shower, use a plastic, non-slip stool. Keep the floor dry. Clean up any water that spills on the floor as soon as it happens. Remove soap buildup in the tub or shower regularly. Attach bath mats securely with double-sided non-slip rug tape. Do not have throw rugs and other things on the floor that can make you trip. What can I do in the bedroom? Use night lights. Make sure that you have a light by your bed that is easy to reach. Do not use any sheets or blankets that are too big for your bed. They should not hang down onto the floor. Have a firm chair that has side arms. You can use this for support while you get dressed. Do not have throw rugs and other things  on the floor that can make you trip. What can I do in the kitchen? Clean up any spills right away. Avoid walking on wet floors. Keep items that you use a lot in easy-to-reach places. If you need to reach something above you, use a strong step stool that has a grab bar. Keep electrical cords out of the way. Do not use floor polish or wax that makes floors slippery. If you must use wax, use non-skid floor wax. Do not have throw rugs and other things on the floor that can make you trip. What can I do with my stairs? Do not leave any items on the stairs. Make sure that there are handrails on both sides of the stairs and use them. Fix handrails that are broken or loose. Make sure that handrails are as long as the stairways. Check any carpeting to make sure that it is firmly attached to the stairs. Fix any carpet that is loose or worn. Avoid having throw rugs at the top or bottom of the stairs. If you do have throw rugs, attach them to the floor with carpet tape. Make sure that you have a light switch at the top of the stairs and the bottom of the stairs. If you do not have them, ask someone to add them for you. What  else can I do to help prevent falls? Wear shoes that: Do not have high heels. Have rubber bottoms. Are comfortable and fit you well. Are closed at the toe. Do not wear sandals. If you use a stepladder: Make sure that it is fully opened. Do not climb a closed stepladder. Make sure that both sides of the stepladder are locked into place. Ask someone to hold it for you, if possible. Clearly mark and make sure that you can see: Any grab bars or handrails. First and last steps. Where the edge of each step is. Use tools that help you move around (mobility aids) if they are needed. These include: Canes. Walkers. Scooters. Crutches. Turn on the lights when you go into a dark area. Replace any light bulbs as soon as they burn out. Set up your furniture so you have a clear path. Avoid  moving your furniture around. If any of your floors are uneven, fix them. If there are any pets around you, be aware of where they are. Review your medicines with your doctor. Some medicines can make you feel dizzy. This can increase your chance of falling. Ask your doctor what other things that you can do to help prevent falls. This information is not intended to replace advice given to you by your health care provider. Make sure you discuss any questions you have with your health care provider. Document Released: 06/24/2009 Document Revised: 02/03/2016 Document Reviewed: 10/02/2014 Elsevier Interactive Patient Education  2017 [employer].

## 2022-08-29 NOTE — Progress Notes (Signed)
Virtual Visit via Telephone Note  I connected with  Jamie Finley on 08/29/22 at  2:45 PM EST by telephone and verified that I am speaking with the correct person using two identifiers.  Location: Patient: home Provider: [place] Persons participating in the virtual visit: [place]   I discussed the limitations, risks, security and privacy concerns of performing an evaluation and management service by telephone and the availability of in person appointments. The patient expressed understanding and agreed to proceed.  Interactive audio and video telecommunications were attempted between this nurse and patient, however failed, due to patient having technical difficulties OR patient did not have access to video capability.  We continued and completed visit with audio only.  Some vital signs may be absent or patient reported.   Jamie David, LPN  Subjective:   Jamie Finley is a 61 y.o. male who presents for Medicare Annual/Subsequent preventive examination.  Review of Systems     Cardiac Risk Factors include: advanced age (>34mn, >>24women);diabetes mellitus;male gender     Objective:    There were no vitals filed for this visit. There is no height or weight on file to calculate BMI.     08/29/2022    2:56 PM 03/16/2022    1:27 PM 02/11/2022    7:53 PM 07/13/2021    3:53 AM 02/08/2021   12:23 AM 01/28/2020   12:37 PM 01/28/2020   12:32 PM  Advanced Directives  Does Patient Have a Medical Advance Directive? _0  No No  Would patient like information on creating a medical advance directive? No - Patient declined No - Patient declined  No - Patient declined  No - Guardian declined No - Guardian declined    Current Medications (verified) Outpatient Encounter Medications as of 08/29/2022  Medication Sig   Accu-Chek Softclix Lancets lancets Use to check fasting blood sugar daily   acetaminophen (TYLENOL) 325 MG tablet Take 325 mg by  mouth every 4 (four) hours as needed.    aspirin 81 MG tablet Take 81 mg by mouth daily.   blood glucose meter kit and supplies Dispense based on patient and insurance preference. Use to check blood sugar two times a day.   Blood Glucose Monitoring Suppl (ACCU-CHEK GUIDE) w/Device KIT Use to check blood fasting blood sugar daily   Cholecalciferol (VITAMIN D3) 50 MCG (2000 UT) TABS Take 5-7 tablets by mouth daily.   colchicine 0.6 MG tablet Take 1 tablet (0.6 mg total) by mouth daily as needed.   doxycycline (VIBRA-TABS) 100 MG tablet Take 1 tablet (100 mg total) by mouth 2 (two) times daily.   gabapentin (NEURONTIN) 400 MG capsule Take 1 capsule (400 mg total) by mouth 2 (two) times daily as needed.   glucose blood (ACCU-CHEK GUIDE) test strip Use to check blood fasting blood sugar daily   hydrochlorothiazide (HYDRODIURIL) 25 MG tablet TAKE 1 TABLET BY MOUTH ONCE DAILY AS NEEDED   metFORMIN (GLUCOPHAGE-XR) 500 MG 24 hr tablet TAKE 2 TABLETS BY MOUTH IN THE MORNING AND AT BEDTIME   methocarbamol (ROBAXIN) 750 MG tablet Take 1 tablet (750 mg total) by mouth daily as needed for muscle spasms.   Multiple Vitamins-Minerals (ZINC PO) Take 1 tablet by mouth daily.   Plecanatide (TRULANCE) 3 MG TABS Take 1 tablet by mouth daily before breakfast.   polyethylene glycol (MIRALAX / GLYCOLAX) 17 g packet Take 17 g by mouth daily.   rosuvastatin (CRESTOR) 20 MG tablet TAKE 1 TABLET  BY MOUTH AT BEDTIME   Semaglutide,0.25 or 0.5MG/DOS, (OZEMPIC, 0.25 OR 0.5 MG/DOSE,) 2 MG/3ML SOPN 0.25 mg injection weekly  x 4 weeks, then increase to 0.5 mg weekly thereafter.   sildenafil (REVATIO) 20 MG tablet Take 5 tablets prior to sexual activity   No facility-administered encounter medications on file as of 08/29/2022.    Allergies (verified) Patient has no known allergies.   History: Past Medical History:  Diagnosis Date   Arthritis    Depression    Diabetes mellitus without complication ([place])    Frequent  headaches    Gout    Hyperlipidemia    Hypertension    Stroke [facility])    Past Surgical History:  Procedure Laterality Date   BACK SURGERY  2007   [facility]   COLONOSCOPY WITH PROPOFOL N/A 01/27/2020   Procedure: COLONOSCOPY WITH PROPOFOL;  Surgeon: Jonathon Bellows, MD;  Location[facility] END[facility]COPY;  Service: Gastroenterology;  Laterality: N/A;   COLONOSCOPY WITH PROPOFOL N/A 01/28/2020   Procedure: COLONOSCOPY WITH PROPOFOL;  Surgeon: Jonathon Bellows, MD;  Location: [facility] ENDOSCOPY;  Service: Gastroenterology;  Laterality: N/A;   TONSILLECTOMY     Family History  Problem Relation Age of Onset   Cancer Mother        face and jaw   Cancer Father        kidney   Early death Father    Diabetes Maternal Grandmother    Depression Maternal Grandfather    Diabetes Maternal Grandfather    Hearing loss Maternal Grandfather    Social History   Socioeconomic History   Marital status: Single    Spouse name: Not on file   Number of children: Not on file   Years of education: Not on file   Highest education level: Not on file  Occupational History   Not on file  Tobacco Use   Smoking status: Former    Packs/day: 1.00    Years: 5.00    Total pack years: 5.00    Types: Cigarettes    Quit date: 09/11/1989    Years since quitting: 32.9   Smokeless tobacco: Never  Vaping Use   Vaping Use: Never used  Substance and Sexual Activity   Alcohol use: Yes    Comment: rarely    Drug use: No   Sexual activity: Not Currently  Other Topics Concern   Not on file  Social History Narrative   Not on file   Social Determinants of Health   Financial Resource Strain: Low Risk  (08/29/2022)   Overall Financial Resource Strain (CARDIA)    Difficulty of Paying Living Expenses: Not very hard  Recent Concern: Financial Resource Strain - Medium Risk (08/24/2022)   Overall Financial Resource Strain (CARDIA)    Difficulty of Paying Living Expenses: Somewhat hard  Food Insecurity: No Food Insecurity (08/29/2022)    Hunger Vital Sign    Worried About Running Out of Food in the Last Year: Never true    Ran Out of Food in the Last Year: Never true  Transportation Needs: No Transportation Needs (08/29/2022)   PRAPARE - Hydrologist (Medical): No    Lack of Transportation (Non-Medical): No  Physical Activity: Inactive (08/29/2022)   Exercise Vital Sign    Days of Exercise per Week: 0 days    Minutes of Exercise per Session: 0 min  Stress: No Stress Concern Present (08/29/2022)   [place]    Feeling of Stress :  Only a little  Social Connections: Moderately Isolated (08/29/2022)   Social Connection and Isolation Panel [NHANES]    Frequency of Communication with Friends and Family: More than three times a week    Frequency of Social Gatherings with Friends and Family: Three times a week    Attends Religious Services: More than 4 times per year    Active Member of Clubs or Organizations: No    Attends Archivist Meetings: Never    Marital Status: Divorced    Tobacco Counseling Counseling given: Not Answered   Clinical Intake:  Pre-visit preparation completed: Yes  Pain : No/denies pain     Nutritional Risks: None Diabetes: Yes CBG done?: No Did pt. bring in CBG monitor from home?: No  How often do you need to have someone help you when you read instructions, pamphlets, or other written materials from your doctor or pharmacy?: 1 - Never  Diabetic?yes Nutrition Risk Assessment:  Has the patient had any N/V/D within the last 2 months?  No  Does the patient have any non-healing wounds?  No  Has the patient had any unintentional weight loss or weight gain?  No   Diabetes:  Is the patient diabetic?  Yes  If diabetic, was a CBG obtained today?  No  Did the patient bring in their glucometer from home?  No  How often do you monitor your CBG's? occasionally.   Financial Strains and  Diabetes Management:  Are you having any financial strains with the device, your supplies or your medication? No .  Does the patient want to be seen by Chronic Care Management for management of their diabetes?  No  Would the patient like to be referred to a Nutritionist or for Diabetic Management?  No   Diabetic Exams:  Diabetic Eye Exam: Completed 02/22/22.  Pt has been advised about the importance in completing this exam.  Diabetic Foot Exam: Completed 07/18/21. Pt has been advised about the importance in completing this exam. .    Interpreter Needed?: No  Information entered by :: Kirke Shaggy, LPN   Activities of Daily Living    08/29/2022    2:57 PM  In your present state of health, do you have any difficulty performing the following activities:  Hearing? 0  Vision? 0  Difficulty concentrating or making decisions? 0  Walking or climbing stairs? 1  Dressing or bathing? 0  Doing errands, shopping? 0  Preparing Food and eating ? N  Using the Toilet? N  In the past six months, have you accidently leaked urine? N  Do you have problems with loss of bowel control? N  Managing your Medications? N  Managing your Finances? N  Housekeeping or managing your Housekeeping? N    Patient Care Team: [name], MD as PCP - General (Family Medicine)  Indicate any recent Medical Services you may have received from other than [facility] providers in the past year (date may be approximate).     Assessment:   This is a routine wellness examination for Jamie Finley.  Hearing/Vision screen Hearing Screening - Comments:: No aids Vision Screening - Comments:: Wears glasses- Dr.Nice  Dietary issues and exercise activities discussed: Current Exercise Habits: The patient does not participate in regular exercise at present   Goals Addressed             This Visit's Progress    DIET - EAT MORE FRUITS AND VEGETABLES         Depression Screen  08/29/2022    2:54 PM 08/18/2022    11:13 AM 03/16/2022    1:28 PM 02/16/2022    2:22 PM 08/09/2021    4:29 PM 07/23/2020    2:12 PM 07/17/2017   10:38 AM  PHQ 2/9 Scores  PHQ - 2 Score 1 3 0 2 4 0 2  PHQ- 9 Score _0 0 9    Fall Risk    08/29/2022    2:57 PM 04/17/2022    1:17 PM 03/16/2022    1:28 PM 08/09/2021    3:22 PM 07/17/2017   10:38 AM  Fall Risk   Falls in the past year? 0 _1 Yes  Number falls in past yr: 1 1  0 2 or more  Injury with Fall? 0 0 0 0 No  Risk for fall due to : No Fall Risks;History of fall(s) History of fall(s);Impaired balance/gait;Impaired mobility History of fall(s);Impaired balance/gait;Impaired mobility    Follow up Falls prevention discussed;Falls evaluation completed        FALL RISK PREVENTION PERTAINING TO THE HOME:  Any stairs in or around the home? No  If so, are there any without handrails? No  Home free of loose throw rugs in walkways, pet beds, electrical cords, etc? Yes  Adequate lighting in your home to reduce risk of falls? Yes   ASSISTIVE DEVICES UTILIZED TO PREVENT FALLS:  Life alert? No  Use of a cane, walker or w/c? Yes  Grab bars in the bathroom? No  Shower chair or bench in shower? Yes  Elevated toilet seat or a handicapped toilet? No    Cognitive Function:        Immunizations Immunization History  Administered Date(s) Administered   Moderna Sars-Covid-2 Vaccination 05/03/2020, 06/07/2020    TDAP status: Due, Education has been provided regarding the importance of this vaccine. Advised may receive this vaccine at local pharmacy or Health Dept. Aware to provide a copy of the vaccination record if obtained from local pharmacy or Health Dept. Verbalized acceptance and understanding.  Flu Vaccine status: Declined, Education has been provided regarding the importance of this vaccine but patient still declined. Advised may receive this vaccine at local pharmacy or Health Dept. Aware to provide a copy of the vaccination record if obtained from local  pharmacy or Health Dept. Verbalized acceptance and understanding.  Pneumococcal vaccine status: Declined,  Education has been provided regarding the importance of this vaccine but patient still declined. Advised may receive this vaccine at local pharmacy or Health Dept. Aware to provide a copy of the vaccination record if obtained from local pharmacy or Health Dept. Verbalized acceptance and understanding.   Covid-19 vaccine status: Completed vaccines  Qualifies for Shingles Vaccine? Yes   Zostavax completed No   Shingrix Completed?: No.    Education has been provided regarding the importance of this vaccine. Patient has been advised to call insurance company to determine out of pocket expense if they have not yet received this vaccine. Advised may also receive vaccine at local pharmacy or Health Dept. Verbalized acceptance and understanding.  Screening Tests Health Maintenance  Topic Date Due   DTaP/Tdap/Td (1 - Tdap) Never done   Zoster Vaccines- Shingrix (1 of 2) Never done   COVID-19 Vaccine (3 - 2023-24 season) 05/12/2022   FOOT EXAM  07/18/2022   INFLUENZA VACCINE  12/10/2022 (Originally 04/11/2022)   COLONOSCOPY (Pts 45-69yr Insurance coverage will need to be confirmed)  01/28/2023   HEMOGLOBIN A1C  02/17/2023  OPHTHALMOLOGY EXAM  02/23/2023   Diabetic kidney evaluation - eGFR measurement  05/12/2023   Diabetic kidney evaluation - Urine ACR  05/12/2023   Medicare Annual Wellness (AWV)  08/30/2023   Hepatitis C Screening  Completed   HIV Screening  Completed   HPV VACCINES  Aged Out    Health Maintenance  Health Maintenance Due  Topic Date Due   DTaP/Tdap/Td (1 - Tdap) Never done   Zoster Vaccines- Shingrix (1 of 2) Never done   COVID-19 Vaccine (3 - 2023-24 season) 05/12/2022   FOOT EXAM  07/18/2022    Colorectal cancer screening: Type of screening: Colonoscopy. Completed 01/28/20. Repeat every 3 years  Lung Cancer Screening: (Low Dose CT Chest recommended if Age 57-80  years, 30 pack-year currently smoking OR have quit w/in 15years.) does not qualify.   Additional Screening:  Hepatitis C Screening: does qualify; Completed 07/17/17  Vision Screening: Recommended annual ophthalmology exams for early detection of glaucoma and other disorders of the eye. Is the patient up to date with their annual eye exam?  Yes  Who is the provider or what is the name of the office in which the patient attends annual eye exams? Dr.Nice If pt is not established with a provider, would they like to be referred to a provider to establish care? No .   Dental Screening: Recommended annual dental exams for proper oral hygiene  Community Resource Referral / Chronic Care Management: CRR required this visit?  No   CCM required this visit?  No      Plan:     I have personally reviewed and noted the following in the patient's chart:   Medical and social history Use of alcohol, tobacco or illicit drugs  Current medications and supplements including opioid prescriptions. Patient is not currently taking opioid prescriptions. Functional ability and status Nutritional status Physical activity Advanced directives List of other physicians Hospitalizations, surgeries, and ER visits in previous 12 months Vitals Screenings to include cognitive, depression, and falls Referrals and appointments  In addition, I have reviewed and discussed with patient certain preventive protocols, quality metrics, and best practice recommendations. A written personalized care plan for preventive services as well as general preventive health recommendations were provided to patient.     Jamie David, LPN   [id number]   Nurse Notes: none

## 2022-08-31 NOTE — Patient Instructions (Signed)
Visit Information  Phone number for Pharmacist: [phone]   Goals Addressed   None     Care Plan : [place]  Updates made by Jourdyn Haws, [place] since 08/31/2022 12:00 AM     Problem: Hypertension, Hyperlipidemia, and Diabetes   Priority: High     Long-Range Goal: Disease mgmt   Start Date: 08/31/2022  Expected End Date: 09/01/2023  This Visit's Progress: On track  Priority: High  Note:   Current Barriers:  Unable to achieve control of DM   Pharmacist Clinical Goal(s):  Patient will achieve control of DM as evidenced by A1c < 8% through collaboration with PharmD and provider.   Interventions: 1:1 collaboration with [name], MD regarding development and update of comprehensive plan of care as evidenced by provider attestation and co-signature Inter-disciplinary care team collaboration (see longitudinal plan of care) Comprehensive medication review performed; medication list updated in electronic medical record  Hypertension (BP goal <130/80) -Controlled - BP low end of normal sometimes -Current treatment: HCTZ 25 mg daily PRN -Medications previously tried: n/a  -Denies hypotensive/hypertensive symptoms -Counseled to monitor BP at home periodically -Recommended to continue current medication  Hyperlipidemia: (LDL goal < 70) -Not ideally controlled - LDL 81 (04/2022) above goal -Hx CVA, previously on clopiodgrel -Current treatment: Rosuvastatin 20 mg daily Aspirin 81 mg daily -Medications previously tried: n/a -Educated on Cholesterol goals; Benefits of statin for ASCVD risk reduction; Importance of limiting foods high in cholesterol; -Recommended to continue current medication; consider ezetimibe in future  Diabetes (A1c goal <7%) -Uncontrolled - A1c 12.4%, up from 9.2% after stopping glipziide/Actos. -DM Dx ~2016 -Current home glucose readings: none available -Denies hypoglycemic/hyperglycemic symptoms -Current  medications: Metformin ER 500 mg - 2 tab BID - Appropriate, Query Effective Glipizide XL 10 mg daily -Appropriate, Query Effective Testing supplies -Medications previously tried: Actos, glipizide   -Current meal patterns: in wheelchair he cannot reach stove to cook, so he eats out often (fat food); drinks: 3 Cheerwines per day -Current exercise: none (wheelchair bound) -Educated on A1c and blood sugar goals; Complications of diabetes including kidney damage, retinal damage, and cardiovascular disease; Benefits of weight loss;  -Discussed internal processes that regulate blood sugar (ie, why sugar can be elevated without eating anything); discussed importance of dietary changes to improve A1c -Reviewed GLP-1 RA class and benefits/side effects; pt would benefit from GLP-1 RA weight loss and CAD prevention as well; demonstrated how to use Ozempic pen using demo device; discussed nausea is most common side effect, discussed diet changes can mitigate nausea -Recommend to start Ozempic 0.25 mg weekly x 4 weeks, then increase to 0.5 mg weekly -Advised to switch Cheerwine to Diet version  Health Maintenance -Vaccine gaps: TDAP, Shingrix -Hx DVT, 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  Patient Goals/Self-Care Activities Patient will:  - take medications as prescribed as evidenced by patient report and record review focus on medication adherence by routine check glucose daily, document, and provide at future appointments engage in dietary modifications by reducing carbs/sweets       Patient verbalizes understanding of instructions and care plan provided today and agrees to view in [place]. Active MyChart status and patient understanding of how to access instructions and care plan via MyChart confirmed with patient.    Telephone follow up appointment with pharmacy team member scheduled for: 1 month  Charlene Brooke, PharmD, [facility] Clinical Pharmacist [facility] [phone]

## 2022-09-01 ENCOUNTER — Encounter: Payer: Self-pay | Admitting: Family Medicine

## 2022-09-01 DIAGNOSIS — Z8614 Personal history of Methicillin resistant Staphylococcus aureus infection: Secondary | ICD-10-CM | POA: Insufficient documentation

## 2022-09-01 LAB — WOUND CULTURE
MICRO NUMBER:: 14334671
SPECIMEN QUALITY:: ADEQUATE

## 2022-09-05 ENCOUNTER — Telehealth: Payer: Self-pay

## 2022-09-05 NOTE — Progress Notes (Signed)
  Care Coordination  Note  09/05/2022 Name: Collen Vincent MRN: [id number] DOB: [birth year]  Amaury Kuzel is a 61 y.o. year old male who is a primary care patient of Bedsole, Amy E, MD. I reached out to [name] by phone today to offer care coordination services.      Mr. Dagher was given information about Care Coordination services today including:  The Care Coordination services include support from the care team which includes your Nurse Coordinator, Clinical Social Worker, or Pharmacist.  The Care Coordination team is here to help remove barriers to the health concerns and goals most important to you. Care Coordination services are voluntary and the patient may decline or stop services at any time by request to their care team member.   Patient agreed to services and verbal consent obtained.   Follow up plan: Telephone appointment with care coordination team member scheduled for:09/12/2022  Noreene Larsson, [place], [place] [address] Direct Dial: [phone] [email or web address]

## 2022-09-05 NOTE — Progress Notes (Signed)
  Chronic Care Management   Note  09/05/2022 Name: Jamie Finley MRN: [id number] DOB: [birth year]  Jamie Finley is a 61 y.o. year old male who is a primary care patient of Bedsole, Amy E, MD. I reached out to [name] by phone today in response to a referral sent by Jamie Finley's PCP.  Jamie Finley was given information about Chronic Care Management services today including:  CCM service includes personalized support from designated clinical staff supervised by the physician, including individualized plan of care and coordination with other care providers 24/7 contact phone numbers for assistance for urgent and routine care needs. Service will only be billed when office clinical staff spend 20 minutes or more in a month to coordinate care. Only one practitioner may furnish and bill the service in a calendar month. The patient may stop CCM services at amy time (effective at the end of the month) by phone call to the office staff. The patient will be responsible for cost sharing (co-pay) or up to 20% of the service fee (after annual deductible is met)  Jamie Finley  agreedto scheduling an appointment with the CCM RN Case Manager   Follow up plan: Patient agreed to scheduled appointment with RN Case Manager on 09/08/2022(date/time).   Noreene Larsson, [place], [place] [address] Direct Dial: [phone] [email or web address]

## 2022-09-08 ENCOUNTER — Encounter: Payer: Self-pay | Admitting: Gastroenterology

## 2022-09-08 ENCOUNTER — Ambulatory Visit (INDEPENDENT_AMBULATORY_CARE_PROVIDER_SITE_OTHER): Payer: Medicare PPO | Admitting: *Deleted

## 2022-09-08 DIAGNOSIS — E1149 Type 2 diabetes mellitus with other diabetic neurological complication: Secondary | ICD-10-CM

## 2022-09-08 DIAGNOSIS — E1159 Type 2 diabetes mellitus with other circulatory complications: Secondary | ICD-10-CM

## 2022-09-08 DIAGNOSIS — R7989 Other specified abnormal findings of blood chemistry: Secondary | ICD-10-CM

## 2022-09-08 MED ORDER — TRULANCE 3 MG PO TABS: 1.0000 | ORAL_TABLET | Freq: Every day | ORAL | 3 refills | Status: DC

## 2022-09-10 DIAGNOSIS — I10 Essential (primary) hypertension: Secondary | ICD-10-CM

## 2022-09-10 DIAGNOSIS — Z7984 Long term (current) use of oral hypoglycemic drugs: Secondary | ICD-10-CM

## 2022-09-10 DIAGNOSIS — E1159 Type 2 diabetes mellitus with other circulatory complications: Secondary | ICD-10-CM

## 2022-09-10 DIAGNOSIS — E785 Hyperlipidemia, unspecified: Secondary | ICD-10-CM

## 2022-09-12 ENCOUNTER — Ambulatory Visit (INDEPENDENT_AMBULATORY_CARE_PROVIDER_SITE_OTHER): Payer: Medicare PPO | Admitting: Podiatry

## 2022-09-12 ENCOUNTER — Ambulatory Visit: Payer: Self-pay | Admitting: *Deleted

## 2022-09-12 ENCOUNTER — Telehealth: Payer: Self-pay | Admitting: Family Medicine

## 2022-09-12 VITALS — BP 132/74 | HR 80

## 2022-09-12 DIAGNOSIS — E1149 Type 2 diabetes mellitus with other diabetic neurological complication: Secondary | ICD-10-CM

## 2022-09-12 DIAGNOSIS — E119 Type 2 diabetes mellitus without complications: Secondary | ICD-10-CM | POA: Diagnosis not present

## 2022-09-12 DIAGNOSIS — E1159 Type 2 diabetes mellitus with other circulatory complications: Secondary | ICD-10-CM

## 2022-09-12 DIAGNOSIS — L853 Xerosis cutis: Secondary | ICD-10-CM

## 2022-09-12 DIAGNOSIS — G834 Cauda equina syndrome: Secondary | ICD-10-CM

## 2022-09-12 MED ORDER — AMMONIUM LACTATE 12 % EX LOTN: 1.0000 | TOPICAL_LOTION | CUTANEOUS | 0 refills | Status: DC | PRN

## 2022-09-12 NOTE — Progress Notes (Signed)
Subjective: Jamie Finley presents today referred by [name], MD for diabetic foot evaluation.  Patient relates many year history of diabetes.  Patient denies any history of foot wounds.  Patient some history of numbness, tingling, burning, pins/needles sensations.  Past Medical History:  Diagnosis Date   Arthritis    Depression    Diabetes mellitus without complication ([place])    Frequent headaches    Gout    Hyperlipidemia    Hypertension    Stroke [facility])     Patient Active Problem List   Diagnosis Date Noted   History of methicillin resistant Staph aureus 09/01/2022   Abscess, gluteal cleft 08/29/2022   Cutaneous abscess of abdominal wall 05/19/2022   Dysthymia 05/03/2021   Decreased mobility of joint 01/28/2021   Peripheral edema 01/05/2021   Erectile dysfunction 07/23/2020   Cardiomegaly 02/25/2019   History of DVT (deep vein thrombosis) 12/24/2018   Hx of Cauda equina syndrome with neurogenic bladder (Jamie Finley) 12/10/2018   Chronic constipation 12/10/2018   Acute idiopathic gout of right foot 09/25/2017   Bilateral knee pain 08/24/2017   MDD (major depressive disorder), recurrent episode, moderate (Jamie Finley) 08/24/2017   Acute midline low back pain without sciatica 02/27/2017   History of CVA (cerebrovascular accident) 01/15/2016   Hyperlipidemia associated with type 2 diabetes mellitus ([name]) 02/18/2015   Peripheral autonomic neuropathy due to diabetes mellitus (Jamie Finley) 02/18/2015   Type 2 diabetes mellitus with neurological complications (Jamie Finley) 71/24/5809   Hypertension associated with diabetes (Jamie Finley) 12/15/2014   Morbid obesity (Jamie Finley) 12/15/2014    Past Surgical History:  Procedure Laterality Date   BACK SURGERY  2007   [facility]   COLONOSCOPY WITH PROPOFOL N/A 01/27/2020   Procedure: COLONOSCOPY WITH PROPOFOL;  Surgeon: Jonathon Bellows, MD;  Location: [facility] ENDOSCOPY;  Service: Gastroenterology;  Laterality: N/A;   COLONOSCOPY WITH PROPOFOL N/A 01/28/2020   Procedure:  COLONOSCOPY WITH PROPOFOL;  Surgeon: Jonathon Bellows, MD;  Location: [facility] ENDOSCOPY;  Service: Gastroenterology;  Laterality: N/A;   TONSILLECTOMY      Current Outpatient Medications on File Prior to Visit  Medication Sig Dispense Refill   Accu-Chek Softclix Lancets lancets Use to check fasting blood sugar daily 100 each 3   acetaminophen (TYLENOL) 325 MG tablet Take 325 mg by mouth every 4 (four) hours as needed.      aspirin 81 MG tablet Take 81 mg by mouth daily.     blood glucose meter kit and supplies Dispense based on patient and insurance preference. Use to check blood sugar two times a day. 1 each 0   Blood Glucose Monitoring Suppl (ACCU-CHEK GUIDE) w/Device KIT Use to check blood fasting blood sugar daily 1 kit 0   Cholecalciferol (VITAMIN D3) 50 MCG (2000 UT) TABS Take 5-7 tablets by mouth daily.     colchicine 0.6 MG tablet Take 1 tablet (0.6 mg total) by mouth daily as needed. 30 tablet 2   doxycycline (VIBRA-TABS) 100 MG tablet Take 1 tablet (100 mg total) by mouth 2 (two) times daily. 20 tablet 0   gabapentin (NEURONTIN) 400 MG capsule Take 1 capsule (400 mg total) by mouth 2 (two) times daily as needed. 60 capsule 3   glucose blood (ACCU-CHEK GUIDE) test strip Use to check blood fasting blood sugar daily 100 each 3   hydrochlorothiazide (HYDRODIURIL) 25 MG tablet TAKE 1 TABLET BY MOUTH ONCE DAILY AS NEEDED 90 tablet 0   metFORMIN (GLUCOPHAGE-XR) 500 MG 24 hr tablet TAKE 2 TABLETS BY MOUTH IN THE MORNING AND AT  BEDTIME 360 tablet 3   methocarbamol (ROBAXIN) 750 MG tablet Take 1 tablet (750 mg total) by mouth daily as needed for muscle spasms. 30 tablet 1   Multiple Vitamins-Minerals (ZINC PO) Take 1 tablet by mouth daily.     Plecanatide (TRULANCE) 3 MG TABS Take 1 tablet by mouth daily before breakfast.     Plecanatide (TRULANCE) 3 MG TABS Take 1 tablet by mouth daily. 90 tablet 3   polyethylene glycol (MIRALAX / GLYCOLAX) 17 g packet Take 17 g by mouth daily.     rosuvastatin  (CRESTOR) 20 MG tablet TAKE 1 TABLET BY MOUTH AT BEDTIME 90 tablet 3   Semaglutide,0.25 or 0.5MG/DOS, (OZEMPIC, 0.25 OR 0.5 MG/DOSE,) 2 MG/3ML SOPN 0.25 mg injection weekly  x 4 weeks, then increase to 0.5 mg weekly thereafter. 3 mL 11   sildenafil (REVATIO) 20 MG tablet Take 5 tablets prior to sexual activity 20 tablet 11   No current facility-administered medications on file prior to visit.     No Known Allergies  Social History   Occupational History   Not on file  Tobacco Use   Smoking status: Former    Packs/day: 1.00    Years: 5.00    Total pack years: 5.00    Types: Cigarettes    Quit date: 09/11/1989    Years since quitting: 33.0   Smokeless tobacco: Never  Vaping Use   Vaping Use: Never used  Substance and Sexual Activity   Alcohol use: Yes    Comment: rarely    Drug use: No   Sexual activity: Not Currently    Family History  Problem Relation Age of Onset   Cancer Mother        face and jaw   Cancer Father        kidney   Early death Father    Diabetes Maternal Grandmother    Depression Maternal Grandfather    Diabetes Maternal Grandfather    Hearing loss Maternal Grandfather     Immunization History  Administered Date(s) Administered   [employer] Vaccination 05/03/2020, 06/07/2020    Review of systems: Positive Findings in bold print.  Constitutional:  chills, fatigue, fever, sweats, weight change Communication: Optometrist, sign Ecologist, hand writing, iPad/Android device Head: headaches, head injury Eyes: changes in vision, eye pain, glaucoma, cataracts, macular degeneration, diplopia, glare,  light sensitivity, eyeglasses or contacts, blindness Ears nose mouth throat: hearing impaired, hearing aids,  ringing in ears, deaf, sign language,  vertigo, nosebleeds,  rhinitis,  cold sores, snoring, swollen glands Cardiovascular: HTN, edema, arrhythmia, pacemaker in place, defibrillator in place, chest pain/tightness, chronic  anticoagulation, blood clot, heart failure, MI Peripheral Vascular: leg cramps, varicose veins, blood clots, lymphedema, varicosities Respiratory:  asthma, difficulty breathing, denies congestion, SOB, wheezing, cough, emphysema Gastrointestinal: change in appetite or weight, abdominal pain, constipation, diarrhea, nausea, vomiting, vomiting blood, change in bowel habits, abdominal pain, jaundice, rectal bleeding, hemorrhoids, GERD Genitourinary:  nocturia,  pain on urination, polyuria,  blood in urine, Foley catheter, urinary urgency, ESRD on hemodialysis Musculoskeletal: amputation, cramping, stiff joints, painful joints, decreased joint motion, fractures, OA, gout, hemiplegia, paraplegia, uses cane, wheelchair bound, uses walker, uses rollator Skin: +changes in toenails, color change, dryness, itching, mole changes,  rash, wound(s) Neurological: headaches, numbness in feet, paresthesias in feet, burning in feet, fainting,  seizures, change in speech, migraines, memory problems/poor historian, cerebral palsy, weakness, paralysis, CVA, TIA Endocrine: diabetes, hypothyroidism, hyperthyroidism,  goiter, dry mouth, flushing, heat intolerance, cold intolerance,  excessive thirst, denies  polyuria,  nocturia Hematological:  easy bleeding, excessive bleeding, easy bruising, enlarged lymph nodes, on long term blood thinner, history of past transusions Allergy/immunological:  hives, eczema, frequent infections, multiple drug allergies, seasonal allergies, transplant recipient, multiple food allergies Psychiatric:  anxiety, depression, mood disorder, suicidal ideations, hallucinations, insomnia  Objective: Vitals:   09/12/22 1336  BP: 132/74  Pulse: 80   Vascular Examination: Capillary refill time less than 3 seconds x 10 digits.  Dorsalis pedis pulses palpable 2 out of 4.  Posterior tibial pulses palpable 2 out of 4.  Digital hair not present x 10 digits.  Skin temperature gradient WNL  b/l.  Dermatological Examination: Skin with normal turgor, texture and tone b/l  Toenails 1-5 b/l discolored, thick, dystrophic with subungual debris and pain with palpation to nailbeds due to thickness of nails.  Musculoskeletal: Muscle strength 5/5 to all LE muscle groups.  Neurological: Sensation diminished with 10 gram monofilament.  Vibratory sensation diminished  Assessment: NIDDM Encounter for diabetic foot examination Xerosis skin  Plan: Discussed diabetic foot care principles. Literature dispensed on today. Patient to continue soft, supportive shoe gear daily. Patient to report any pedal injuries to medical professional immediately. Follow up one year. Patient/POA to call should there be a concern in the interim. Ammonium lactate was sent to the pharmacy advised him apply twice a day he states understanding

## 2022-09-12 NOTE — Patient Instructions (Addendum)
Please call the care guide team at [phone] if you need to cancel or reschedule your appointment.   If you are experiencing a Mental Health or [place] or need someone to talk to, please call the Suicide and Crisis Lifeline: 988 call the [place] National Suicide Prevention Lifeline: 954-722-1145 or TTY: 8436530352 TTY 5594695665) to talk to a trained counselor call 1-800-273-TALK (toll free, 24 hour hotline) go to [facility] [address] [place] [phone]) call 911   Following is a copy of the CCM Program Consent:  CCM service includes personalized support from designated clinical staff supervised by the physician, including individualized plan of care and coordination with other care providers 24/7 contact phone numbers for assistance for urgent and routine care needs. Service will only be billed when office clinical staff spend 20 minutes or more in a month to coordinate care. Only one practitioner may furnish and bill the service in a calendar month. The patient may stop CCM services at amy time (effective at the end of the month) by phone call to the office staff. The patient will be responsible for cost sharing (co-pay) or up to 20% of the service fee (after annual deductible is met)  Following is a copy of your full provider care plan:   Goals Addressed             This Visit's Progress    CCM (DIABETES) EXPECTED OUTCOME:  MONITOR, SELF-MANAGE AND REDUCE SYMPTOMS OF DIABETES       Current Barriers:  Knowledge Deficits related to Diabetes management Chronic Disease Management support and education needs related to Diabetes, diet No Advanced Directives in place- pt declines information Patient reports he lives alone, has pastor from his church he can call on if needed, states overall independent with ADL, IADL's, continues to drive, has difficulty sometimes standing up too long for cooking and washing dishes, has  WC, ramp, cane walker and always uses DME when ambulating, states continues to get out and be active. Patient reports he had stopped checking CBG and started back yesterday with reading last night 334 and random reading today 227 after breakfast, pt states he does not always follow a special diet, was drinking sugary drinks, recently switched to diet drinks. PHQ9=5, pt states he saw counselor this year but no longer goes, social work/ counseling services offered, pt declined. Patient reports 3 falls this past year with no injury  Planned Interventions: Provided education to patient about basic DM disease process; Reviewed medications with patient and discussed importance of medication adherence;        Reviewed prescribed diet with patient carbohydrate modified; Counseled on importance of regular laboratory monitoring as prescribed;        Discussed plans with patient for ongoing care management follow up and provided patient with direct contact information for care management team;      Provided patient with written educational materials related to hypo and hyperglycemia and importance of correct treatment;       Advised patient, providing education and rationale, to check cbg per MD order  and record        Review of patient status, including review of consultants reports, relevant laboratory and other test results, and medications completed;       Screening for signs and symptoms of depression related to chronic disease state;        Assessed social determinant of health barriers;        Safety precautions reviewed  Symptom Management: Take medications as prescribed   Attend all scheduled provider appointments Call pharmacy for medication refills 3-7 days in advance of running out of medications Attend church or other social activities Perform all self care activities independently  Perform IADL's (shopping, preparing meals, housekeeping, managing finances) independently Call provider  office for new concerns or questions  check blood sugar at prescribed times: per doctor's order  check feet daily for cuts, sores or redness enter blood sugar readings and medication or insulin into daily log take the blood sugar log to all doctor visits take the blood sugar meter to all doctor visits trim toenails straight across fill half of plate with vegetables limit fast food meals to no more than 1 per week manage portion size prepare main meal at home 3 to 5 days each week read food labels for fat, fiber, carbohydrates and portion size Look over education sent via my chart- hypoglycemia fall prevention strategies: change position slowly, use assistive device such as walker or cane (per provider recommendations) when walking, keep walkways clear, have good lighting in room. It is important to contact your provider if you have any falls, maintain muscle strength/tone by exercise per provider recommendations.  Follow Up Plan: Telephone follow up appointment with care management team member scheduled for:  10/31/22 at 215 pm       CCM (HYPERTENSION) EXPECTED OUTCOME: MONITOR, SELF-MANAGE AND REDUCE SYMPTOMS OF HYPERTENSION       Current Barriers:  Knowledge Deficits related to Hypertension management Chronic Disease Management support and education needs related to Hypertension, diet No Advanced Directives in place- pt declines Patient reports he has a wrist cuff but does not monitor blood pressure at home, states " it's always usually good"  Planned Interventions: Evaluation of current treatment plan related to hypertension self management and patient's adherence to plan as established by provider;   Reviewed prescribed diet low sodium Reviewed medications with patient and discussed importance of compliance;  Counseled on the importance of exercise goals with target of 150 minutes per week Discussed plans with patient for ongoing care management follow up and provided patient with  direct contact information for care management team; Advised patient, providing education and rationale, to monitor blood pressure daily and record, calling PCP for findings outside established parameters;  Advised patient to discuss any issues with blood pressure, medications with provider; Provided education on prescribed diet low sodium;  Discussed complications of poorly controlled blood pressure such as heart disease, stroke, circulatory complications, vision complications, kidney impairment, sexual dysfunction;  Screening for signs and symptoms of depression related to chronic disease state;  Assessed social determinant of health barriers;   Symptom Management: Take medications as prescribed   Attend all scheduled provider appointments Call pharmacy for medication refills 3-7 days in advance of running out of medications Attend church or other social activities Perform all self care activities independently  Perform IADL's (shopping, preparing meals, housekeeping, managing finances) independently Call provider office for new concerns or questions  check blood pressure weekly choose a place to take my blood pressure (home, clinic or office, retail store) write blood pressure results in a log or diary learn about high blood pressure keep a blood pressure log take blood pressure log to all doctor appointments keep all doctor appointments take medications for blood pressure exactly as prescribed eat more whole grains, fruits and vegetables, lean meats and healthy fats Look over education sent via my chart- low sodium diet  Follow Up Plan: Telephone follow up appointment  with care management team member scheduled for:  10/31/22 at 215 pm          Patient verbalizes understanding of instructions and care plan provided today and agrees to view in [place]. Active MyChart status and patient understanding of how to access instructions and care plan via MyChart confirmed with patient.      Telephone follow up appointment with care management team member scheduled for:  10/31/22 at 215 pm  Low-Sodium Eating Plan Sodium, which is an element that makes up salt, helps you maintain a healthy balance of fluids in your body. Too much sodium can increase your blood pressure and cause fluid and waste to be held in your body. Your health care provider or dietitian may recommend following this plan if you have high blood pressure (hypertension), kidney disease, liver disease, or heart failure. Eating less sodium can help lower your blood pressure, reduce swelling, and protect your heart, liver, and kidneys. What are tips for following this plan? Reading food labels The Nutrition Facts label lists the amount of sodium in one serving of the food. If you eat more than one serving, you must multiply the listed amount of sodium by the number of servings. Choose foods with less than 140 mg of sodium per serving. Avoid foods with 300 mg of sodium or more per serving. Shopping  Look for lower-sodium products, often labeled as "low-sodium" or "no salt added." Always check the sodium content, even if foods are labeled as "unsalted" or "no salt added." Buy fresh foods. Avoid canned foods and pre-made or frozen meals. Avoid canned, cured, or processed meats. Buy breads that have less than 80 mg of sodium per slice. Cooking  Eat more home-cooked food and less restaurant, buffet, and fast food. Avoid adding salt when cooking. Use salt-free seasonings or herbs instead of table salt or sea salt. Check with your health care provider or pharmacist before using salt substitutes. Cook with plant-based oils, such as canola, sunflower, or olive oil. Meal planning When eating at a restaurant, ask that your food be prepared with less salt or no salt, if possible. Avoid dishes labeled as brined, pickled, cured, smoked, or made with soy sauce, miso, or teriyaki sauce. Avoid foods that contain MSG (monosodium  glutamate). MSG is sometimes added to [place] food, bouillon, and some canned foods. Make meals that can be grilled, baked, poached, roasted, or steamed. These are generally made with less sodium. General information Most people on this plan should limit their sodium intake to 1,500-2,000 mg (milligrams) of sodium each day. What foods should I eat? Fruits Fresh, frozen, or canned fruit. Fruit juice. Vegetables Fresh or frozen vegetables. "No salt added" canned vegetables. "No salt added" tomato sauce and paste. Low-sodium or reduced-sodium tomato and vegetable juice. Grains Low-sodium cereals, including oats, puffed wheat and rice, and shredded wheat. Low-sodium crackers. Unsalted rice. Unsalted pasta. Low-sodium bread. Whole-grain breads and whole-grain pasta. Meats and other proteins Fresh or frozen (no salt added) meat, poultry, seafood, and fish. Low-sodium canned tuna and salmon. Unsalted nuts. Dried peas, beans, and lentils without added salt. Unsalted canned beans. Eggs. Unsalted nut butters. Dairy Milk. Soy milk. Cheese that is naturally low in sodium, such as ricotta cheese, fresh mozzarella, or Swiss cheese. Low-sodium or reduced-sodium cheese. Cream cheese. Yogurt. Seasonings and condiments Fresh and dried herbs and spices. Salt-free seasonings. Low-sodium mustard and ketchup. Sodium-free salad dressing. Sodium-free light mayonnaise. Fresh or refrigerated horseradish. Lemon juice. Vinegar. Other foods Homemade, reduced-sodium, or low-sodium soups. Unsalted  popcorn and pretzels. Low-salt or salt-free chips. The items listed above may not be a complete list of foods and beverages you can eat. Contact a dietitian for more information. What foods should I avoid? Vegetables Sauerkraut, pickled vegetables, and relishes. Olives. [place] fries. Onion rings. Regular canned vegetables (not low-sodium or reduced-sodium). Regular canned tomato sauce and paste (not low-sodium or reduced-sodium).  Regular tomato and vegetable juice (not low-sodium or reduced-sodium). Frozen vegetables in sauces. Grains Instant hot cereals. Bread stuffing, pancake, and biscuit mixes. Croutons. Seasoned rice or pasta mixes. Noodle soup cups. Boxed or frozen macaroni and cheese. Regular salted crackers. Self-rising flour. Meats and other proteins Meat or fish that is salted, canned, smoked, spiced, or pickled. Precooked or cured meat, such as sausages or meat loaves. Berniece Salines. Ham. Pepperoni. Hot dogs. Corned beef. Chipped beef. Salt pork. Jerky. Pickled herring. Anchovies and sardines. Regular canned tuna. Salted nuts. Dairy Processed cheese and cheese spreads. Hard cheeses. Cheese curds. Blue cheese. Feta cheese. String cheese. Regular cottage cheese. Buttermilk. Canned milk. Fats and oils Salted butter. Regular margarine. Ghee. Bacon fat. Seasonings and condiments Onion salt, garlic salt, seasoned salt, table salt, and sea salt. Canned and packaged gravies. Worcestershire sauce. Tartar sauce. Barbecue sauce. Teriyaki sauce. Soy sauce, including reduced-sodium. Steak sauce. Fish sauce. Oyster sauce. Cocktail sauce. Horseradish that you find on the shelf. Regular ketchup and mustard. Meat flavorings and tenderizers. Bouillon cubes. Hot sauce. Pre-made or packaged marinades. Pre-made or packaged taco seasonings. Relishes. Regular salad dressings. Salsa. Other foods Salted popcorn and pretzels. Corn chips and puffs. Potato and tortilla chips. Canned or dried soups. Pizza. Frozen entrees and pot pies. The items listed above may not be a complete list of foods and beverages you should avoid. Contact a dietitian for more information. Summary Eating less sodium can help lower your blood pressure, reduce swelling, and protect your heart, liver, and kidneys. Most people on this plan should limit their sodium intake to 1,500-2,000 mg (milligrams) of sodium each day. Canned, boxed, and frozen foods are high in sodium.  Restaurant foods, fast foods, and pizza are also very high in sodium. You also get sodium by adding salt to food. Try to cook at home, eat more fresh fruits and vegetables, and eat less fast food and canned, processed, or prepared foods. This information is not intended to replace advice given to you by your health care provider. Make sure you discuss any questions you have with your health care provider. Document Revised: 10/03/2019 Document Reviewed: 07/30/2019 Elsevier Patient Education  [place]. Hypoglycemia Hypoglycemia is when the sugar (glucose) level in your blood is too low. Low blood sugar can happen to people who have diabetes and people who do not have diabetes. Low blood sugar can happen quickly, and it can be an emergency. What are the causes? This condition happens most often in people who have diabetes. It may be caused by: Diabetes medicine. Not eating enough, or not eating often enough. Doing more physical activity. Drinking alcohol on an empty stomach. If you do not have diabetes, this condition may be caused by: A tumor in the pancreas. Not eating enough, or not eating for long periods at a time (fasting). A very bad infection or illness. Problems after having weight loss (bariatric) surgery. Kidney failure or liver failure. Certain medicines. What increases the risk? This condition is more likely to develop in people who: Have diabetes and take medicines to lower their blood sugar. Abuse alcohol. Have a very bad illness. What  are the signs or symptoms? Mild Hunger. Sweating and feeling clammy. Feeling dizzy or light-headed. Being sleepy or having trouble sleeping. Feeling like you may vomit (nauseous). A fast heartbeat. A headache. Blurry vision. Mood changes, such as: Being grouchy. Feeling worried or nervous (anxious). Tingling or loss of feeling (numbness) around your mouth, lips, or tongue. Moderate Confusion and poor judgment. Behavior  changes. Weakness. Uneven heartbeat. Trouble with moving (coordination). Very low Very low blood sugar (severe hypoglycemia) is a medical emergency. It can cause: Fainting. Seizures. Loss of consciousness (coma). Death. How is this treated? Treating low blood sugar Low blood sugar is often treated by eating or drinking something that has sugar in it right away. The food or drink should contain 15 grams of a fast-acting carb (carbohydrate). Options include: 4 oz (120 mL) of fruit juice. 4 oz (120 mL) of regular soda (not diet soda). A few pieces of hard candy. Check food labels to see how many pieces to eat for 15 grams. 1 Tbsp (15 mL) of sugar or honey. 4 glucose tablets. 1 tube of glucose gel. Treating low blood sugar if you have diabetes If you can think clearly and swallow safely, follow the 15:15 rule: Take 15 grams of a fast-acting carb. Talk with your doctor about how much you should take. Always keep a source of fast-acting carb with you, such as: Glucose tablets (take 4 tablets). A few pieces of hard candy. Check food labels to see how many pieces to eat for 15 grams. 4 oz (120 mL) of fruit juice. 4 oz (120 mL) of regular soda (not diet soda). 1 Tbsp (15 mL) of honey or sugar. 1 tube of glucose gel. Check your blood sugar 15 minutes after you take the carb. If your blood sugar is still at or below 70 mg/dL (3.9 mmol/L), take 15 grams of a carb again. If your blood sugar does not go above 70 mg/dL (3.9 mmol/L) after 3 tries, get help right away. After your blood sugar goes back to normal, eat a meal or a snack within 1 hour.  Treating very low blood sugar If your blood sugar is below 54 mg/dL (3 mmol/L), you have very low blood sugar, or severe hypoglycemia. This is an emergency. Get medical help right away. If you have very low blood sugar and you cannot eat or drink, you will need to be given a hormone called glucagon. A family member or friend should learn how to check  your blood sugar and how to give you glucagon. Ask your doctor if you need to have an emergency glucagon kit at home. Very low blood sugar may also need to be treated in a hospital. Follow these instructions at home: General instructions Take over-the-counter and prescription medicines only as told by your doctor. Stay aware of your blood sugar as told by your doctor. If you drink alcohol: Limit how much you have to: 0-1 drink a day for women who are not pregnant. 0-2 drinks a day for men. Know how much alcohol is in your drink. In the [place] one drink equals one 12 oz bottle of beer (355 mL), one 5 oz glass of wine (148 mL), or one 1 oz glass of hard liquor (44 mL). Be sure to eat food when you drink alcohol. Know that your body absorbs alcohol quickly. This may lead to low blood sugar later. Be sure to keep checking your blood sugar. Keep all follow-up visits. If you have diabetes:  Always have a fast-acting  carb (15 grams) with you to treat low blood sugar. Follow your diabetes care plan as told by your doctor. Make sure you: Know the symptoms of low blood sugar. Check your blood sugar as often as told. Always check it before and after exercise. Always check your blood sugar before you drive. Take your medicines as told. Follow your meal plan. Eat on time. Do not skip meals. Share your diabetes care plan with: Your work or school. People you live with. Carry a card or wear jewelry that says you have diabetes. Where to find more information American Diabetes Association: www.diabetes.org Contact a doctor if: You have trouble keeping your blood sugar in your target range. You have low blood sugar often. Get help right away if: You still have symptoms after you eat or drink something that contains 15 grams of fast-acting carb, and you cannot get your blood sugar above 70 mg/dL by following the 15:15 rule. Your blood sugar is below 54 mg/dL (3 mmol/L). You have a seizure. You  faint. These symptoms may be an emergency. Get help right away. Call your local emergency services (911 in the [place] Do not wait to see if the symptoms will go away. Do not drive yourself to the hospital. Summary Hypoglycemia happens when the level of sugar (glucose) in your blood is too low. Low blood sugar can happen to people who have diabetes and people who do not have diabetes. Low blood sugar can happen quickly, and it can be an emergency. Make sure you know the symptoms of low blood sugar and know how to treat it. Always keep a source of sugar (fast-acting carb) with you to treat low blood sugar. This information is not intended to replace advice given to you by your health care provider. Make sure you discuss any questions you have with your health care provider. Document Revised: 07/29/2020 Document Reviewed: 07/29/2020 Elsevier Patient Education  [place].

## 2022-09-12 NOTE — Chronic Care Management (AMB) (Signed)
Chronic Care Management   CCM RN Visit Note  09/12/2022 Name: Jamie Finley MRN: [id number] DOB: April 09, 1961  Subjective: Jamie Finley is a 62 y.o. year old male who is a primary care patient of Bedsole, Amy E, MD. The patient was referred to the Chronic Care Management team for assistance with care management needs subsequent to provider initiation of CCM services and plan of care.    Today's Visit:  Engaged with patient by telephone for initial visit.     SDOH Interventions Today    Flowsheet Row Most Recent Value  SDOH Interventions   Food Insecurity Interventions Intervention Not Indicated  Housing Interventions Intervention Not Indicated  Transportation Interventions Intervention Not Indicated  Utilities Interventions Intervention Not Indicated  Depression Interventions/Treatment  --  [pt went to counseling this year, has now stopped now, pt refuses social work]  Financial Strain Interventions Intervention Not Indicated  Physical Activity Interventions Patient Refused  Stress Interventions Intervention Not Indicated  Social Connections Interventions Intervention Not Indicated         Goals Addressed             This Visit's Progress    CCM (DIABETES) EXPECTED OUTCOME:  MONITOR, SELF-MANAGE AND REDUCE SYMPTOMS OF DIABETES       Current Barriers:  Knowledge Deficits related to Diabetes management Chronic Disease Management support and education needs related to Diabetes, diet No Advanced Directives in place- pt declines information Patient reports he lives alone, has pastor from his church he can call on if needed, states overall independent with ADL, IADL's, continues to drive, has difficulty sometimes standing up too long for cooking and washing dishes, has WC, ramp, cane walker and always uses DME when ambulating, states continues to get out and be active. Patient reports he had stopped checking CBG and started back yesterday with reading last night 334 and random  reading today 227 after breakfast, pt states he does not always follow a special diet, was drinking sugary drinks, recently switched to diet drinks. PHQ9=5, pt states he saw counselor this year but no longer goes, social work/ counseling services offered, pt declined. Patient reports 3 falls this past year with no injury  Planned Interventions: Provided education to patient about basic DM disease process; Reviewed medications with patient and discussed importance of medication adherence;        Reviewed prescribed diet with patient carbohydrate modified; Counseled on importance of regular laboratory monitoring as prescribed;        Discussed plans with patient for ongoing care management follow up and provided patient with direct contact information for care management team;      Provided patient with written educational materials related to hypo and hyperglycemia and importance of correct treatment;       Advised patient, providing education and rationale, to check cbg per MD order  and record        Review of patient status, including review of consultants reports, relevant laboratory and other test results, and medications completed;       Screening for signs and symptoms of depression related to chronic disease state;        Assessed social determinant of health barriers;        Safety precautions reviewed  Symptom Management: Take medications as prescribed   Attend all scheduled provider appointments Call pharmacy for medication refills 3-7 days in advance of running out of medications Attend church or other social activities Perform all self care activities independently  Perform IADL's (shopping, preparing meals,  housekeeping, managing finances) independently Call provider office for new concerns or questions  check blood sugar at prescribed times: per doctor's order  check feet daily for cuts, sores or redness enter blood sugar readings and medication or insulin into daily  log take the blood sugar log to all doctor visits take the blood sugar meter to all doctor visits trim toenails straight across fill half of plate with vegetables limit fast food meals to no more than 1 per week manage portion size prepare main meal at home 3 to 5 days each week read food labels for fat, fiber, carbohydrates and portion size Look over education sent via my chart- hypoglycemia fall prevention strategies: change position slowly, use assistive device such as walker or cane (per provider recommendations) when walking, keep walkways clear, have good lighting in room. It is important to contact your provider if you have any falls, maintain muscle strength/tone by exercise per provider recommendations.  Follow Up Plan: Telephone follow up appointment with care management team member scheduled for:  10/31/22 at 215 pm       CCM (HYPERTENSION) EXPECTED OUTCOME: MONITOR, SELF-MANAGE AND REDUCE SYMPTOMS OF HYPERTENSION       Current Barriers:  Knowledge Deficits related to Hypertension management Chronic Disease Management support and education needs related to Hypertension, diet No Advanced Directives in place- pt declines Patient reports he has a wrist cuff but does not monitor blood pressure at home, states " it's always usually good"  Planned Interventions: Evaluation of current treatment plan related to hypertension self management and patient's adherence to plan as established by provider;   Reviewed prescribed diet low sodium Reviewed medications with patient and discussed importance of compliance;  Counseled on the importance of exercise goals with target of 150 minutes per week Discussed plans with patient for ongoing care management follow up and provided patient with direct contact information for care management team; Advised patient, providing education and rationale, to monitor blood pressure daily and record, calling PCP for findings outside established parameters;   Advised patient to discuss any issues with blood pressure, medications with provider; Provided education on prescribed diet low sodium;  Discussed complications of poorly controlled blood pressure such as heart disease, stroke, circulatory complications, vision complications, kidney impairment, sexual dysfunction;  Screening for signs and symptoms of depression related to chronic disease state;  Assessed social determinant of health barriers;   Symptom Management: Take medications as prescribed   Attend all scheduled provider appointments Call pharmacy for medication refills 3-7 days in advance of running out of medications Attend church or other social activities Perform all self care activities independently  Perform IADL's (shopping, preparing meals, housekeeping, managing finances) independently Call provider office for new concerns or questions  check blood pressure weekly choose a place to take my blood pressure (home, clinic or office, retail store) write blood pressure results in a log or diary learn about high blood pressure keep a blood pressure log take blood pressure log to all doctor appointments keep all doctor appointments take medications for blood pressure exactly as prescribed eat more whole grains, fruits and vegetables, lean meats and healthy fats Look over education sent via my chart- low sodium diet  Follow Up Plan: Telephone follow up appointment with care management team member scheduled for:  10/31/22 at 215 pm          Plan:Telephone follow up appointment with care management team member scheduled for:  10/31/22 at 215 pm  Jacqlyn Larsen [facility], BSN RN Case Manager  Therapist, music at [place]

## 2022-09-12 NOTE — Patient Outreach (Unsigned)
  Care Coordination   Initial Visit Note   09/13/2022 Name: Patric Buckhalter MRN: [id number] DOB: 10-14-1960  Ketan Renz is a 62 y.o. year old male who sees [name], MD for primary care. I spoke with  [name] by phone today.  What matters to the patients health and wellness today?  Meals on Wheels referral    Goals Addressed             This Visit's Progress    Care management activities       Care Coordination Interventions: Community resource needs assessment completed Patient states that he lives alone, has limited support and is wheelchair bound Patient confirms that due to mobility challenges he has difficulty standing/holding his balance to cook-eats out a lot as a result of this-patient agreeable to referral for Meals on Wheels-VM left to complete referral Patient  requesting resources for housekeeping, however it was explained that housekeeping is typically a out of pocket expense Patient discussed  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, has a history of mental health treatment, currently denies  suicide or homicide ideation-confirms that mood has improved with the passing of the holidays-denies need for continued mental health follow up at this time Depression screen reviewed  Solution-Focused Strategies employed:  Active listening / Reflection utilized  Emotional Support Provided         SDOH assessments and interventions completed:  Yes  SDOH Interventions Today    Flowsheet Row Most Recent Value  SDOH Interventions   Food Insecurity Interventions Other (Comment)  [agreeable to meals on wheels referral]  Housing Interventions Intervention Not Indicated        Care Coordination Interventions:  Yes, provided   Follow up plan: No further intervention required.   Encounter Outcome:  Pt. Visit Completed

## 2022-09-12 NOTE — Plan of Care (Signed)
Chronic Care Management Provider Comprehensive Care Plan    09/12/2022 Name: Jamie Finley MRN: [id number] DOB: January 06, 1961  Referral to Chronic Care Management (CCM) services was placed by Provider:  [name] MD on Date: 08/25/22 and 09/12/22.  Chronic Condition 1: DIABETES Provider Assessment and Plan   Restart glipizide  XL 10 mg daily.. take with food.Marland Kitchen at least until we can get you set up with the pharmacist.  We will make referral pharmacy to discuss coverage of Ozempic or other GLP1 meds       Expected Outcome/Goals Addressed This Visit (Provider CCM goals/Provider Assessment and plan  CCM (DIABETES) EXPECTED OUTCOME:  MONITOR, SELF-MANAGE AND REDUCE SYMPTOMS OF DIABETES  Symptom Management Condition 1: Take medications as prescribed   Attend all scheduled provider appointments Call pharmacy for medication refills 3-7 days in advance of running out of medications Attend church or other social activities Perform all self care activities independently  Perform IADL's (shopping, preparing meals, housekeeping, managing finances) independently Call provider office for new concerns or questions  check blood sugar at prescribed times: per doctor's order  check feet daily for cuts, sores or redness enter blood sugar readings and medication or insulin into daily log take the blood sugar log to all doctor visits take the blood sugar meter to all doctor visits trim toenails straight across fill half of plate with vegetables limit fast food meals to no more than 1 per week manage portion size prepare main meal at home 3 to 5 days each week read food labels for fat, fiber, carbohydrates and portion size Look over education sent via my chart- hypoglycemia fall prevention strategies: change position slowly, use assistive device such as walker or cane (per provider recommendations) when walking, keep walkways clear, have good lighting in room. It is important to contact your provider  if you have any falls, maintain muscle strength/tone by exercise per provider recommendations.  Chronic Condition 2: HYPERTENSION Provider Assessment and Plan  Hypertension associated with diabetes (HCC) (Chronic)       Stable, chronic.  Continue current medication.       Expected Outcome/Goals Addressed This Visit (Provider CCM goals/Provider Assessment and plan  CCM (HYPERTENSION) EXPECTED OUTCOME: MONITOR, SELF-MANAGE AND REDUCE SYMPTOMS OF HYPERTENSION  Symptom Management Condition 2: Take medications as prescribed   Attend all scheduled provider appointments Call pharmacy for medication refills 3-7 days in advance of running out of medications Attend church or other social activities Perform all self care activities independently  Perform IADL's (shopping, preparing meals, housekeeping, managing finances) independently Call provider office for new concerns or questions  check blood pressure weekly choose a place to take my blood pressure (home, clinic or office, retail store) write blood pressure results in a log or diary learn about high blood pressure keep a blood pressure log take blood pressure log to all doctor appointments keep all doctor appointments take medications for blood pressure exactly as prescribed eat more whole grains, fruits and vegetables, lean meats and healthy fats Look over education sent via my chart- low sodium diet  Problem List Patient Active Problem List   Diagnosis Date Noted   History of methicillin resistant Staph aureus 09/01/2022   Abscess, gluteal cleft 08/29/2022   Cutaneous abscess of abdominal wall 05/19/2022   Dysthymia 05/03/2021   Decreased mobility of joint 01/28/2021   Peripheral edema 01/05/2021   Erectile dysfunction 07/23/2020   Cardiomegaly 02/25/2019   History of DVT (deep vein thrombosis) 12/24/2018   Hx of Cauda equina syndrome  with neurogenic bladder ([place]) 12/10/2018   Chronic constipation 12/10/2018   Acute  idiopathic gout of right foot 09/25/2017   Bilateral knee pain 08/24/2017   MDD (major depressive disorder), recurrent episode, moderate ([place]) 08/24/2017   Acute midline low back pain without sciatica 02/27/2017   History of CVA (cerebrovascular accident) 01/15/2016   Hyperlipidemia associated with type 2 diabetes mellitus ([place]) 02/18/2015   Peripheral autonomic neuropathy due to diabetes mellitus ([place]) 02/18/2015   Type 2 diabetes mellitus with neurological complications ([place]) 35/57/3220   Hypertension associated with diabetes ([place]) 12/15/2014   Morbid obesity ([place]) 12/15/2014    Medication Management  Current Outpatient Medications:    Accu-Chek Softclix Lancets lancets, Use to check fasting blood sugar daily, Disp: 100 each, Rfl: 3   acetaminophen (TYLENOL) 325 MG tablet, Take 325 mg by mouth every 4 (four) hours as needed. , Disp: , Rfl:    aspirin 81 MG tablet, Take 81 mg by mouth daily., Disp: , Rfl:    blood glucose meter kit and supplies, Dispense based on patient and insurance preference. Use to check blood sugar two times a day., Disp: 1 each, Rfl: 0   Blood Glucose Monitoring Suppl (ACCU-CHEK GUIDE) w/Device KIT, Use to check blood fasting blood sugar daily, Disp: 1 kit, Rfl: 0   Cholecalciferol (VITAMIN D3) 50 MCG (2000 UT) TABS, Take 5-7 tablets by mouth daily., Disp: , Rfl:    colchicine 0.6 MG tablet, Take 1 tablet (0.6 mg total) by mouth daily as needed., Disp: 30 tablet, Rfl: 2   doxycycline (VIBRA-TABS) 100 MG tablet, Take 1 tablet (100 mg total) by mouth 2 (two) times daily., Disp: 20 tablet, Rfl: 0   gabapentin (NEURONTIN) 400 MG capsule, Take 1 capsule (400 mg total) by mouth 2 (two) times daily as needed., Disp: 60 capsule, Rfl: 3   glucose blood (ACCU-CHEK GUIDE) test strip, Use to check blood fasting blood sugar daily, Disp: 100 each, Rfl: 3   hydrochlorothiazide (HYDRODIURIL) 25 MG tablet, TAKE 1 TABLET BY MOUTH ONCE DAILY AS NEEDED, Disp: 90 tablet, Rfl: 0    metFORMIN (GLUCOPHAGE-XR) 500 MG 24 hr tablet, TAKE 2 TABLETS BY MOUTH IN THE MORNING AND AT BEDTIME, Disp: 360 tablet, Rfl: 3   methocarbamol (ROBAXIN) 750 MG tablet, Take 1 tablet (750 mg total) by mouth daily as needed for muscle spasms., Disp: 30 tablet, Rfl: 1   Multiple Vitamins-Minerals (ZINC PO), Take 1 tablet by mouth daily., Disp: , Rfl:    Plecanatide (TRULANCE) 3 MG TABS, Take 1 tablet by mouth daily before breakfast., Disp: , Rfl:    polyethylene glycol (MIRALAX / GLYCOLAX) 17 g packet, Take 17 g by mouth daily., Disp: , Rfl:    rosuvastatin (CRESTOR) 20 MG tablet, TAKE 1 TABLET BY MOUTH AT BEDTIME, Disp: 90 tablet, Rfl: 3   Semaglutide,0.25 or 0.5MG/DOS, (OZEMPIC, 0.25 OR 0.5 MG/DOSE,) 2 MG/3ML SOPN, 0.25 mg injection weekly  x 4 weeks, then increase to 0.5 mg weekly thereafter., Disp: 3 mL, Rfl: 11   sildenafil (REVATIO) 20 MG tablet, Take 5 tablets prior to sexual activity, Disp: 20 tablet, Rfl: 11   ammonium lactate (AMLACTIN) 12 % lotion, Apply 1 Application topically as needed for dry skin., Disp: 400 g, Rfl: 0   Plecanatide (TRULANCE) 3 MG TABS, Take 1 tablet by mouth daily., Disp: 90 tablet, Rfl: 3  Cognitive Assessment Identity Confirmed: : Name; DOB Cognitive Status: Normal   Functional Assessment Hearing Difficulty or Deaf: no Wear Glasses or Blind: yes Vision Management:  reading glasses Concentrating, Remembering or Making Decisions Difficulty (CP): no Difficulty Communicating: no Difficulty Eating/Swallowing: no Walking or Climbing Stairs Difficulty: yes Walking or Climbing Stairs: ambulation difficulty, requires equipment Mobility Management: has walker, WC, cane,  ramp, is able to walk but requires DME   pt is able to drive Dressing/Bathing Difficulty: no Doing Errands Independently Difficulty (such as shopping) (CP): no   Caregiver Assessment  Primary Source of Support/Comfort: nonrelative caregiver Name of Support/Comfort Primary Source: no one to call on  pastor People in Home: alone   Planned Interventions  Provided education to patient about basic DM disease process; Reviewed medications with patient and discussed importance of medication adherence;        Reviewed prescribed diet with patient carbohydrate modified; Counseled on importance of regular laboratory monitoring as prescribed;        Discussed plans with patient for ongoing care management follow up and provided patient with direct contact information for care management team;      Provided patient with written educational materials related to hypo and hyperglycemia and importance of correct treatment;       Advised patient, providing education and rationale, to check cbg per MD order  and record        Review of patient status, including review of consultants reports, relevant laboratory and other test results, and medications completed;       Screening for signs and symptoms of depression related to chronic disease state;        Assessed social determinant of health barriers;        Safety precautions reviewed Evaluation of current treatment plan related to hypertension self management and patient's adherence to plan as established by provider;   Reviewed prescribed diet low sodium Reviewed medications with patient and discussed importance of compliance;  Counseled on the importance of exercise goals with target of 150 minutes per week Discussed plans with patient for ongoing care management follow up and provided patient with direct contact information for care management team; Advised patient, providing education and rationale, to monitor blood pressure daily and record, calling PCP for findings outside established parameters;  Advised patient to discuss any issues with blood pressure, medications with provider; Provided education on prescribed diet low sodium;  Discussed complications of poorly controlled blood pressure such as heart disease, stroke, circulatory complications,  vision complications, kidney impairment, sexual dysfunction;  Screening for signs and symptoms of depression related to chronic disease state;  Assessed social determinant of health barriers;   Interaction and coordination with outside resources, practitioners, and providers See CCM Referral  Care Plan: Available in MyChart

## 2022-09-12 NOTE — Telephone Encounter (Signed)
-----   Message from Kassie Mends, RN sent at 09/08/2022  1:45 PM EST ----- Regarding: new order needed I rec'd a REF [id number] order on 08/25/22 but there is only one dx DM, can you please add another dx (HTN, HLD would be billable), please send new order or you may be able to edit the existing order.   thanks

## 2022-09-12 NOTE — Patient Instructions (Addendum)
Visit Information  Thank you for taking time to visit with me today. Please don't hesitate to contact me if I can be of assistance to you.   Following are the goals we discussed today:   Goals Addressed             This Visit's Progress    Care management activities       Care Coordination Interventions: Community resource needs assessment completed Patient states that he lives alone, has limited support and is wheelchair bound Patient confirms that due to mobility challenges he has difficulty standing/holding his balance to cook-eats out a lot as a result of this-patient agreeable to referral for Meals on Wheels-VM left to complete referral Patient  requesting resources for housekeeping, however it was explained that housekeeping is typically a out of pocket expense Patient discussed  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, has a history of mental health treatment, currently denies  suicide or homicide ideation-confirms that mood has improved with the passing of the holidays-denies need for continued mental health follow up at this time Depression screen reviewed  Solution-Focused Strategies employed:  Active listening / Reflection utilized  Emotional Support Provided           Please call the care guide team at [phone] if you need to cancel or reschedule your appointment.   If you are experiencing a Mental Health or [place] or need someone to talk to, please call the Suicide and Crisis Lifeline: 988   Patient verbalizes understanding of instructions and care plan provided today and agrees to view in [place]. Active MyChart status and patient understanding of how to access instructions and care plan via MyChart confirmed with patient.     No further follow up required: patient to call this Education officer, museum with any additional community resource needs  [employer], [place] Worker  [facility] Care Management [phone]

## 2022-09-14 ENCOUNTER — Telehealth: Payer: Self-pay | Admitting: *Deleted

## 2022-09-14 NOTE — Patient Outreach (Signed)
  Care Coordination   Follow Up Visit Note   09/14/2022 Name: Jamie Finley MRN: [id number] DOB: 07/10/1961  Jamie Finley is a 62 y.o. year old male who sees [name], MD for primary care. I  spoke with [employer] to complete a referral  What matters to the patients health and wellness today?  Meals on Wheels referral    Goals Addressed             This Visit's Progress    Care management activities       Care Coordination Interventions: Referral completed for Meals on Wheels Patient placed on waiting list and will be contacted by the social worker to complete the in home assessment once his name comes up on the list        SDOH assessments and interventions completed:  Yes     Care Coordination Interventions:  Yes, provided   Follow up plan: No further intervention required.   Encounter Outcome:  Pt. Visit Completed

## 2022-09-17 ENCOUNTER — Encounter: Payer: Self-pay | Admitting: Family Medicine

## 2022-09-17 DIAGNOSIS — H543 Unqualified visual loss, both eyes: Secondary | ICD-10-CM

## 2022-09-17 DIAGNOSIS — H539 Unspecified visual disturbance: Secondary | ICD-10-CM

## 2022-09-18 ENCOUNTER — Telehealth: Payer: Self-pay | Admitting: *Deleted

## 2022-09-18 ENCOUNTER — Encounter: Payer: Self-pay | Admitting: Gastroenterology

## 2022-09-18 ENCOUNTER — Other Ambulatory Visit: Payer: Self-pay

## 2022-09-18 ENCOUNTER — Other Ambulatory Visit: Payer: Self-pay | Admitting: Family Medicine

## 2022-09-18 MED ORDER — LUBIPROSTONE 24 MCG PO CAPS: 24.0000 ug | ORAL_CAPSULE | Freq: Two times a day (BID) | ORAL | 3 refills | Status: DC

## 2022-09-18 NOTE — Patient Outreach (Signed)
  Care Coordination   Collaboration phone call  Visit Note   09/18/2022 Name: Avion Kutzer MRN: [id number] DOB: 12-31-1960  Nyzier Boivin is a 62 y.o. year old male who sees [name], MD for primary care. I  spoke with Veronia Beets with [employer]  What matters to the patients health and wellness today?  Meals on Wheels    Goals Addressed             This Visit's Progress    Care management activities       Care Coordination Interventions: Return call from [employer], Lincoln Maxin , Radio broadcast assistant who states that she did not receive the referral for patient for Meals on Wheels Referral for Meals on Wheels re-submitted Patient placed on waiting list and will be contacted by the social worker to complete the in home assessment once his name comes up on the list        SDOH assessments and interventions completed:  No     Care Coordination Interventions:  Yes, provided   Follow up plan: No further intervention required.   Encounter Outcome:  Pt. Visit Completed

## 2022-09-19 ENCOUNTER — Other Ambulatory Visit: Payer: Self-pay

## 2022-09-19 MED ORDER — LUBIPROSTONE 24 MCG PO CAPS: 24.0000 ug | ORAL_CAPSULE | Freq: Two times a day (BID) | ORAL | 3 refills | Status: DC

## 2022-09-20 DIAGNOSIS — G834 Cauda equina syndrome: Secondary | ICD-10-CM | POA: Diagnosis not present

## 2022-09-20 DIAGNOSIS — Z8673 Personal history of transient ischemic attack (TIA), and cerebral infarction without residual deficits: Secondary | ICD-10-CM | POA: Diagnosis not present

## 2022-09-20 DIAGNOSIS — Z7689 Persons encountering health services in other specified circumstances: Secondary | ICD-10-CM | POA: Diagnosis not present

## 2022-09-21 DIAGNOSIS — E1165 Type 2 diabetes mellitus with hyperglycemia: Secondary | ICD-10-CM | POA: Diagnosis not present

## 2022-09-21 DIAGNOSIS — H16223 Keratoconjunctivitis sicca, not specified as Sjogren's, bilateral: Secondary | ICD-10-CM | POA: Diagnosis not present

## 2022-09-21 DIAGNOSIS — H2513 Age-related nuclear cataract, bilateral: Secondary | ICD-10-CM | POA: Diagnosis not present

## 2022-09-22 ENCOUNTER — Telehealth: Payer: Medicare PPO

## 2022-09-26 ENCOUNTER — Encounter: Payer: Self-pay | Admitting: Podiatry

## 2022-09-27 ENCOUNTER — Telehealth: Payer: Self-pay | Admitting: *Deleted

## 2022-09-27 NOTE — Telephone Encounter (Signed)
Patient has been scheduled w/ physician 09/29/22 '@7'$ :30 am, confirmed this with patient. Left heel has a possible contusion on the left heel(bruised/and soft spot), denies any pain but concerned because of his diabetes.

## 2022-09-29 ENCOUNTER — Ambulatory Visit (INDEPENDENT_AMBULATORY_CARE_PROVIDER_SITE_OTHER): Payer: Medicare PPO | Admitting: Podiatry

## 2022-09-29 VITALS — BP 132/74

## 2022-09-29 DIAGNOSIS — L89622 Pressure ulcer of left heel, stage 2: Secondary | ICD-10-CM

## 2022-09-29 DIAGNOSIS — E1149 Type 2 diabetes mellitus with other diabetic neurological complication: Secondary | ICD-10-CM

## 2022-09-29 NOTE — Progress Notes (Signed)
Subjective:  Patient ID: Jamie Finley, male    DOB: 12/07/1960,  MRN: [id number]  Chief Complaint  Patient presents with   Callouses    62 y.o. male presents with the above complaint.  Patient presents with concern of left heel dry blister formation.  Patient states that this came out of nowhere and has progressive gotten worse he wanted to get it evaluated.  He wants to make sure that there is nothing going on he is a diabetic.  He has never had a pressure sore.  He denies any other acute complaints.   Review of Systems: Negative except as noted in the HPI. Denies N/V/F/Ch.  Past Medical History:  Diagnosis Date   Arthritis    Depression    Diabetes mellitus without complication (HCC)    Frequent headaches    Gout    Hyperlipidemia    Hypertension    Stroke [facility])     Current Outpatient Medications:    Accu-Chek Softclix Lancets lancets, Use to check fasting blood sugar daily, Disp: 100 each, Rfl: 3   acetaminophen (TYLENOL) 325 MG tablet, Take 325 mg by mouth every 4 (four) hours as needed. , Disp: , Rfl:    ammonium lactate (AMLACTIN) 12 % lotion, Apply 1 Application topically as needed for dry skin., Disp: 400 g, Rfl: 0   aspirin 81 MG tablet, Take 81 mg by mouth daily., Disp: , Rfl:    blood glucose meter kit and supplies, Dispense based on patient and insurance preference. Use to check blood sugar two times a day., Disp: 1 each, Rfl: 0   Blood Glucose Monitoring Suppl (ACCU-CHEK GUIDE) w/Device KIT, Use to check blood fasting blood sugar daily, Disp: 1 kit, Rfl: 0   Cholecalciferol (VITAMIN D3) 50 MCG (2000 UT) TABS, Take 5-7 tablets by mouth daily., Disp: , Rfl:    colchicine 0.6 MG tablet, Take 1 tablet (0.6 mg total) by mouth daily as needed., Disp: 30 tablet, Rfl: 2   doxycycline (VIBRA-TABS) 100 MG tablet, Take 1 tablet (100 mg total) by mouth 2 (two) times daily., Disp: 20 tablet, Rfl: 0   gabapentin (NEURONTIN) 400 MG capsule, Take 1 capsule (400 mg total) by mouth  2 (two) times daily as needed., Disp: 60 capsule, Rfl: 3   glucose blood (ACCU-CHEK GUIDE) test strip, Use to check blood fasting blood sugar daily, Disp: 100 each, Rfl: 3   hydrochlorothiazide (HYDRODIURIL) 25 MG tablet, Take 1 tablet (25 mg total) by mouth daily as needed., Disp: 90 tablet, Rfl: 1   lubiprostone (AMITIZA) 24 MCG capsule, Take 1 capsule (24 mcg total) by mouth 2 (two) times daily with a meal., Disp: 60 capsule, Rfl: 3   metFORMIN (GLUCOPHAGE-XR) 500 MG 24 hr tablet, TAKE 2 TABLETS BY MOUTH IN THE MORNING AND AT BEDTIME, Disp: 360 tablet, Rfl: 3   methocarbamol (ROBAXIN) 750 MG tablet, Take 1 tablet (750 mg total) by mouth daily as needed for muscle spasms., Disp: 30 tablet, Rfl: 1   Multiple Vitamins-Minerals (ZINC PO), Take 1 tablet by mouth daily., Disp: , Rfl:    polyethylene glycol (MIRALAX / GLYCOLAX) 17 g packet, Take 17 g by mouth daily., Disp: , Rfl:    rosuvastatin (CRESTOR) 20 MG tablet, TAKE 1 TABLET BY MOUTH AT BEDTIME, Disp: 90 tablet, Rfl: 3   Semaglutide,0.25 or 0.'5MG'$ /DOS, (OZEMPIC, 0.25 OR 0.5 MG/DOSE,) 2 MG/3ML SOPN, 0.25 mg injection weekly  x 4 weeks, then increase to 0.5 mg weekly thereafter., Disp: 3 mL, Rfl: 11  sildenafil (REVATIO) 20 MG tablet, Take 5 tablets prior to sexual activity, Disp: 20 tablet, Rfl: 11  Social History   Tobacco Use  Smoking Status Former   Packs/day: 1.00   Years: 5.00   Total pack years: 5.00   Types: Cigarettes   Quit date: 09/11/1989   Years since quitting: 33.0  Smokeless Tobacco Never    No Known Allergies Objective:   Vitals:   09/29/22 0734  BP: 132/74   There is no height or weight on file to calculate BMI. Constitutional Well developed. Well nourished.  Vascular Dorsalis pedis pulses palpable bilaterally. Posterior tibial pulses palpable bilaterally. Capillary refill normal to all digits.  No cyanosis or clubbing noted. Pedal hair growth normal.  Neurologic Normal speech. Oriented to person, place, and  time. Epicritic sensation to light touch grossly present bilaterally.  Dermatologic Left heel stage I pressure ulceration noted.  Underlying skin is intact.  Hematoma/dried blistering noted.  No signs of infection noted no open wounds or lesion noted  Orthopedic: Normal joint ROM without pain or crepitus bilaterally. No visible deformities. No bony tenderness.   Radiographs: None Assessment:   1. Type 2 diabetes mellitus with neurological complications ([place])   2. Pressure injury of left heel, stage 2 ([place])    Plan:  Patient was evaluated and treated and all questions answered.  Left heel beginning pressure ulceration stage I -All questions and concerns were discussed with the patient in extensive detail -I discussed with the patient the importance of shoe gear modification and offloading of the heel.  I discussed with him when he is sleeping to aggressively offload with pillows or offloading boots.  He states understanding -He will come back and see me if it continues to regress.  At this time I am not clinically concerning for wound but I did encourage him to continue monitoring he states understanding  No follow-ups on file.

## 2022-09-30 DIAGNOSIS — L602 Onychogryphosis: Secondary | ICD-10-CM | POA: Insufficient documentation

## 2022-09-30 NOTE — Assessment & Plan Note (Signed)
Chronic, inadequate control. Restart glipizide  XL 10 mg daily.. take with food.Marland Kitchen at least until we can get you set up with the pharmacist.  We will make referral pharmacy to discuss coverage of Ozempic or other GLP1 meds

## 2022-09-30 NOTE — Assessment & Plan Note (Signed)
Chronic, secondary to DM.  Cannot cut nails.  Refer to podiatry.

## 2022-10-03 ENCOUNTER — Telehealth (INDEPENDENT_AMBULATORY_CARE_PROVIDER_SITE_OTHER): Payer: Medicare PPO | Admitting: Gastroenterology

## 2022-10-03 ENCOUNTER — Encounter: Payer: Self-pay | Admitting: Family Medicine

## 2022-10-03 ENCOUNTER — Ambulatory Visit (INDEPENDENT_AMBULATORY_CARE_PROVIDER_SITE_OTHER): Payer: Medicare PPO | Admitting: Family Medicine

## 2022-10-03 VITALS — BP 118/66 | HR 76 | Temp 98.0°F | Resp 16 | Ht 74.0 in | Wt 310.0 lb

## 2022-10-03 DIAGNOSIS — L84 Corns and callosities: Secondary | ICD-10-CM | POA: Insufficient documentation

## 2022-10-03 DIAGNOSIS — L03115 Cellulitis of right lower limb: Secondary | ICD-10-CM

## 2022-10-03 DIAGNOSIS — Z8614 Personal history of Methicillin resistant Staphylococcus aureus infection: Secondary | ICD-10-CM | POA: Diagnosis not present

## 2022-10-03 DIAGNOSIS — E1149 Type 2 diabetes mellitus with other diabetic neurological complication: Secondary | ICD-10-CM

## 2022-10-03 DIAGNOSIS — K5909 Other constipation: Secondary | ICD-10-CM

## 2022-10-03 DIAGNOSIS — L0202 Furuncle of face: Secondary | ICD-10-CM | POA: Diagnosis not present

## 2022-10-03 DIAGNOSIS — E1159 Type 2 diabetes mellitus with other circulatory complications: Secondary | ICD-10-CM | POA: Diagnosis not present

## 2022-10-03 MED ORDER — DOXYCYCLINE HYCLATE 100 MG PO TABS: 100.0000 mg | ORAL_TABLET | Freq: Two times a day (BID) | ORAL | 0 refills | Status: DC

## 2022-10-03 MED ORDER — OZEMPIC (0.25 OR 0.5 MG/DOSE) 2 MG/3ML ~~LOC~~ SOPN: 0.5000 mg | PEN_INJECTOR | SUBCUTANEOUS | 11 refills | Status: DC

## 2022-10-03 NOTE — Assessment & Plan Note (Signed)
Acute, small pustule on right upper lip possibly due to bacterial folliculitis/boil.  Given right lower leg issue as well we will treat with doxycycline.  He will start warm compresses 2-3 times a day on right upper lip.   Return and ER precautions provided.

## 2022-10-03 NOTE — Progress Notes (Signed)
Jamie Finley , MD [address]  [place]  [place], [place] [address]  Main: [phone]  Fax: [phone]   Primary Care Physician: [name], MD  Virtual Visit via Video Note  I connected with patient on 10/03/22 at  3:30 PM EST by video and verified that I am speaking with the correct person using two identifiers.   I discussed the limitations, risks, security and privacy concerns of performing an evaluation and management service by video  and the availability of in person appointments. I also discussed with the patient that there may be a patient responsible charge related to this service. The patient expressed understanding and agreed to proceed.  Location of Patient: Home Location of Provider: Home Persons involved: Patient and provider only   History of Present Illness: Chief Complaint  Patient presents with   Constipation    HPI: Jamie Finley is a 62 y.o. male  Summary of history :  He says he is always suffer from chronic constipation but recently has been a bit worse when he passes stool consistency of pebbles, due to his back surgery has difficulty straining.  Tried over-the-counter MiraLAX not helped completely has added some Benefiber in his diet.  No blood in the stool.  Commenced on Ozempic recently  01/28/2020 colonoscopy: 4 sessile polyps subcentimeter resected there were tubular adenomas.  For this procedure 2 he had difficulty doing the bowel prep he had to come on 2 days for the same.   05/11/2022: HbA1c 9.2 no recent TSH.    Interval history 08/28/2022-10/03/2022   08/28/2022: TSH high free t4,t3 ordered.  Trulance declined  He is taking Amitiza and seems to be helping him with the constipation.    Current Outpatient Medications  Medication Sig Dispense Refill   Accu-Chek Softclix Lancets lancets Use to check fasting blood sugar daily 100 each 3   acetaminophen (TYLENOL) 325 MG tablet Take 325 mg by mouth every 4 (four) hours as  needed.      ammonium lactate (AMLACTIN) 12 % lotion Apply 1 Application topically as needed for dry skin. 400 g 0   aspirin 81 MG tablet Take 81 mg by mouth daily.     blood glucose meter kit and supplies Dispense based on patient and insurance preference. Use to check blood sugar two times a day. 1 each 0   Blood Glucose Monitoring Suppl (ACCU-CHEK GUIDE) w/Device KIT Use to check blood fasting blood sugar daily 1 kit 0   Cholecalciferol (VITAMIN D3) 50 MCG (2000 UT) TABS Take 5-7 tablets by mouth daily.     colchicine 0.6 MG tablet Take 1 tablet (0.6 mg total) by mouth daily as needed. 30 tablet 2   doxycycline (VIBRA-TABS) 100 MG tablet Take 1 tablet (100 mg total) by mouth 2 (two) times daily. 20 tablet 0   gabapentin (NEURONTIN) 400 MG capsule Take 1 capsule (400 mg total) by mouth 2 (two) times daily as needed. 60 capsule 3   glucose blood (ACCU-CHEK GUIDE) test strip Use to check blood fasting blood sugar daily 100 each 3   hydrochlorothiazide (HYDRODIURIL) 25 MG tablet Take 1 tablet (25 mg total) by mouth daily as needed. 90 tablet 1   lubiprostone (AMITIZA) 24 MCG capsule Take 1 capsule (24 mcg total) by mouth 2 (two) times daily with a meal. 60 capsule 3   metFORMIN (GLUCOPHAGE-XR) 500 MG 24 hr tablet TAKE 2 TABLETS BY MOUTH IN THE MORNING AND AT BEDTIME 360 tablet 3  methocarbamol (ROBAXIN) 750 MG tablet Take 1 tablet (750 mg total) by mouth daily as needed for muscle spasms. 30 tablet 1   Multiple Vitamins-Minerals (ZINC PO) Take 1 tablet by mouth daily.     polyethylene glycol (MIRALAX / GLYCOLAX) 17 g packet Take 17 g by mouth daily.     rosuvastatin (CRESTOR) 20 MG tablet TAKE 1 TABLET BY MOUTH AT BEDTIME 90 tablet 3   Semaglutide,0.25 or 0.'5MG'$ /DOS, (OZEMPIC, 0.25 OR 0.5 MG/DOSE,) 2 MG/3ML SOPN Inject 0.5 mg as directed once a week. 3 mL 11   sildenafil (REVATIO) 20 MG tablet Take 5 tablets prior to sexual activity 20 tablet 11   No current facility-administered medications for  this visit.    Allergies as of 10/03/2022   (No Known Allergies)    Review of Systems:    All systems reviewed and negative except where noted in HPI.  General Appearance:    Alert, cooperative, no distress, appears stated age  Head:    Normocephalic, without obvious abnormality, atraumatic  Eyes:    PERRL, conjunctiva/corneas clear,  Ears:    Grossly normal hearing    Neurologic:  Grossly normal    Observations/Objective:  Labs: CMP     Component Value Date/Time   NA 134 (L) 05/11/2022 0926   NA 141 01/06/2015 0411   K 3.5 05/11/2022 0926   K 4.0 01/06/2015 0411   CL 95 (L) 05/11/2022 0926   CL 104 01/06/2015 0411   CO2 31 05/11/2022 0926   CO2 30 01/06/2015 0411   GLUCOSE 248 (H) 05/11/2022 0926   GLUCOSE 115 (H) 01/06/2015 0411   BUN 13 05/11/2022 0926   BUN 20 01/06/2015 0411   CREATININE 1.21 05/11/2022 0926   CREATININE 0.92 06/13/2017 1512   CALCIUM 9.4 05/11/2022 0926   CALCIUM 9.4 01/06/2015 0411   PROT 7.5 05/11/2022 0926   PROT 7.7 01/06/2015 0411   ALBUMIN 4.0 05/11/2022 0926   ALBUMIN 4.3 01/06/2015 0411   AST 15 05/11/2022 0926   AST 30 01/06/2015 0411   ALT 14 05/11/2022 0926   ALT 35 01/06/2015 0411   ALKPHOS 77 05/11/2022 0926   ALKPHOS 55 01/06/2015 0411   BILITOT 0.8 05/11/2022 0926   BILITOT 0.8 01/06/2015 0411   GFRNONAA >60 02/11/2022 1953   GFRNONAA 93 06/13/2017 1512   GFRAA >60 02/11/2019 2336   GFRAA 107 06/13/2017 1512   Lab Results  Component Value Date   WBC 8.5 02/11/2022   HGB 13.3 02/11/2022   HCT 41.8 02/11/2022   MCV 85.5 02/11/2022   PLT 258 02/11/2022    Imaging Studies: No results found.  Assessment and Plan:   Jamie Finley is a 62 y.o. y/o male for for constipation, HbA1c 9.2.  Even during his colonoscopy in 2021 he had difficulty doing the bowel prep as he was not cleaned out had to repeat the prep on the second day.  Likely chronic constipation or combination due to diabetic neuropathy as well as related  to his back problem   Plan 1. Continue amitiza  2. TSH is high needs to follow up with [name], MD     I discussed the assessment and treatment plan with the patient. The patient was provided an opportunity to ask questions and all were answered. The patient agreed with the plan and demonstrated an understanding of the instructions.   The patient was advised to call back or seek an in-person evaluation if the symptoms worsen or if the condition fails  to improve as anticipated.  I provided 15 minutes of face-to-face time during this encounter.  Dr Jamie Bellows MD,MRCP [facility]) Gastroenterology/Hepatology Pager: [phone]   Speech recognition software was used to dictate this note.

## 2022-10-03 NOTE — Progress Notes (Signed)
Patient ID: Jamie Finley, male    DOB: 1960-09-13, 62 y.o.   MRN: [id number]  This visit was conducted in person.  BP 118/66   Pulse 76   Temp 98 F (36.7 C)   Resp 16   Ht '6\' 2"'$  (1.88 m)   Wt (!) 310 lb (140.6 kg)   SpO2 97%   BMI 39.80 kg/m    CC:  Chief Complaint  Patient presents with   Blister    On lips X 1 month   Cellulitis    Right leg   Foot Swelling    Left foot    Subjective:   HPI: Jamie Finley is a 62 y.o. male with history of diabetes and wheelchair-bound status given history of cauda equina, bilateral lower extremity partial paralysis.  Presenting on 10/03/2022 for Blister (On lips X 1 month), Cellulitis (Right leg), and Foot Swelling (Left foot)    Knot above lip, white head above.. noted around 12/25... washing with dial soap. No drainage.  Slightly painful. May have decreased in size in last 1-2 days.  2.right leg redness and rash: He reports he has noted increasing pain in right lower leg redness, streaky. Has noted for several weeks. More warm  than other.  No flu like symptoms, no fever. Has had similar in past.   3. Has seen podiatrist 09/29/2021 for  callus.. shaved callus that day, has had some resulting bleeding Dr. Posey Pronto. Left foot has not unwrapped since then, has been putting it in a bag when he showers.  No pain      16 lb weight loss on ozempic 0.5 mg weekly now.  CBS 100-122 fasting. Wt Readings from Last 3 Encounters:  10/03/22 (!) 310 lb (140.6 kg)  08/29/22 (!) 326 lb (147.9 kg)  08/18/22 (!) 326 lb 4 oz (148 kg)     Relevant past medical, surgical, family and social history reviewed and updated as indicated. Interim medical history since our last visit reviewed. Allergies and medications reviewed and updated. Outpatient Medications Prior to Visit  Medication Sig Dispense Refill   Accu-Chek Softclix Lancets lancets Use to check fasting blood sugar daily 100 each 3   acetaminophen (TYLENOL) 325 MG tablet Take 325 mg  by mouth every 4 (four) hours as needed.      ammonium lactate (AMLACTIN) 12 % lotion Apply 1 Application topically as needed for dry skin. 400 g 0   aspirin 81 MG tablet Take 81 mg by mouth daily.     blood glucose meter kit and supplies Dispense based on patient and insurance preference. Use to check blood sugar two times a day. 1 each 0   Blood Glucose Monitoring Suppl (ACCU-CHEK GUIDE) w/Device KIT Use to check blood fasting blood sugar daily 1 kit 0   Cholecalciferol (VITAMIN D3) 50 MCG (2000 UT) TABS Take 5-7 tablets by mouth daily.     colchicine 0.6 MG tablet Take 1 tablet (0.6 mg total) by mouth daily as needed. 30 tablet 2   gabapentin (NEURONTIN) 400 MG capsule Take 1 capsule (400 mg total) by mouth 2 (two) times daily as needed. 60 capsule 3   glucose blood (ACCU-CHEK GUIDE) test strip Use to check blood fasting blood sugar daily 100 each 3   hydrochlorothiazide (HYDRODIURIL) 25 MG tablet Take 1 tablet (25 mg total) by mouth daily as needed. 90 tablet 1   lubiprostone (AMITIZA) 24 MCG capsule Take 1 capsule (24 mcg total) by mouth 2 (two) times daily with a  meal. 60 capsule 3   metFORMIN (GLUCOPHAGE-XR) 500 MG 24 hr tablet TAKE 2 TABLETS BY MOUTH IN THE MORNING AND AT BEDTIME 360 tablet 3   methocarbamol (ROBAXIN) 750 MG tablet Take 1 tablet (750 mg total) by mouth daily as needed for muscle spasms. 30 tablet 1   Multiple Vitamins-Minerals (ZINC PO) Take 1 tablet by mouth daily.     polyethylene glycol (MIRALAX / GLYCOLAX) 17 g packet Take 17 g by mouth daily.     rosuvastatin (CRESTOR) 20 MG tablet TAKE 1 TABLET BY MOUTH AT BEDTIME 90 tablet 3   sildenafil (REVATIO) 20 MG tablet Take 5 tablets prior to sexual activity 20 tablet 11   Semaglutide,0.25 or 0.'5MG'$ /DOS, (OZEMPIC, 0.25 OR 0.5 MG/DOSE,) 2 MG/3ML SOPN 0.25 mg injection weekly  x 4 weeks, then increase to 0.5 mg weekly thereafter. 3 mL 11   doxycycline (VIBRA-TABS) 100 MG tablet Take 1 tablet (100 mg total) by mouth 2 (two) times  daily. 20 tablet 0   No facility-administered medications prior to visit.     Per HPI unless specifically indicated in ROS section below Review of Systems  Constitutional:  Negative for fatigue and fever.  HENT:  Negative for ear pain.   Eyes:  Negative for pain.  Respiratory:  Negative for cough and shortness of breath.   Cardiovascular:  Negative for chest pain, palpitations and leg swelling.  Gastrointestinal:  Negative for abdominal pain.  Genitourinary:  Negative for dysuria.  Musculoskeletal:  Negative for arthralgias.  Neurological:  Negative for syncope, light-headedness and headaches.  Psychiatric/Behavioral:  Negative for dysphoric mood.    Objective:  BP 118/66   Pulse 76   Temp 98 F (36.7 C)   Resp 16   Ht '6\' 2"'$  (1.88 m)   Wt (!) 310 lb (140.6 kg)   SpO2 97%   BMI 39.80 kg/m   Wt Readings from Last 3 Encounters:  10/03/22 (!) 310 lb (140.6 kg)  08/29/22 (!) 326 lb (147.9 kg)  08/18/22 (!) 326 lb 4 oz (148 kg)      Physical Exam Constitutional:      Appearance: He is well-developed.  HENT:     Head: Normocephalic.     Right Ear: Hearing normal.     Left Ear: Hearing normal.     Nose: Nose normal.  Neck:     Thyroid: No thyroid mass or thyromegaly.     Vascular: No carotid bruit.     Trachea: Trachea normal.  Cardiovascular:     Rate and Rhythm: Normal rate and regular rhythm.     Pulses: Normal pulses.     Heart sounds: Heart sounds not distant. No murmur heard.    No friction rub. No gallop.     Comments: No peripheral edema Pulmonary:     Effort: Pulmonary effort is normal. No respiratory distress.     Breath sounds: Normal breath sounds.  Skin:    General: Skin is warm and dry.     Findings: No rash.     Comments: Above right upper lip, firm pustule, minimal associated erythema. Bilateral lower legs chronic venous changes... right leg slightly more red than left, increased warmth and pain to palpation per pt  Left lower leg > swelling than  right  Left posterior heel with bruising and callus, scab s/p debridement  Psychiatric:        Speech: Speech normal.        Behavior: Behavior normal.  Thought Content: Thought content normal.       Results for orders placed or performed in visit on 08/29/22  WOUND CULTURE   Specimen: Wound  Result Value Ref Range   MICRO NUMBER: [id number]    SPECIMEN QUALITY: Adequate    SOURCE: WOUND (SITE NOT SPECIFIED)    STATUS: FINAL    GRAM STAIN:      Few epithelial cells No white blood cells seen Moderate Gram positive cocci in clusters   ISOLATE 1: methicillin resistant Staphylococcus aureus (A)       Susceptibility   Methicillin resistant staphylococcus aureus - AEROBIC CULT, GRAM STAIN POSITIVE 1    VANCOMYCIN 1 Sensitive     CIPROFLOXACIN <=0.5 Sensitive     CLINDAMYCIN <=0.25 Sensitive     LEVOFLOXACIN 0.25 Sensitive     ERYTHROMYCIN <=0.25 Sensitive     GENTAMICIN <=0.5 Sensitive     OXACILLIN* NR Resistant      * Oxacillin-resistant staphylococci are resistant to all currently available beta-lactam antimicrobial agents with the possible exception of ceftaroline.     TETRACYCLINE <=1 Sensitive     TRIMETH/SULFA* <=10 Sensitive      * Oxacillin-resistant staphylococci are resistant to all currently available beta-lactam antimicrobial agents with the possible exception of ceftaroline. Legend: S = Susceptible  I = Intermediate R = Resistant  NS = Not susceptible * = Not tested  NR = Not reported **NN = See antimicrobic comments     Assessment and Plan  Cellulitis of right lower leg Assessment & Plan: Acute, possibly early cellulitis right lower leg.  Will treat with doxycycline given history of MRSA.  Encourage patient to elevate leg as able.   Callus of foot Assessment & Plan: Status postdebridement with podiatry.  Well-healing area.  Recommended continuing to wash with warm soapy water.  No clear need for bandage but if he prefers extra support he can use a  heel cup or Band-Aid with Neosporin.   Boil, face Assessment & Plan: Acute, small pustule on right upper lip possibly due to bacterial folliculitis/boil.  Given right lower leg issue as well we will treat with doxycycline.  He will start warm compresses 2-3 times a day on right upper lip.   Return and ER precautions provided.   Controlled type 2 diabetes mellitus with other circulatory complication, without long-term current use of insulin (HCC)  History of MRSA infection  Type 2 diabetes mellitus with neurological complications ([place]) Assessment & Plan: Chronic Significant improvement with start of Ozempic, now on 0.5 mg weekly.  Refilled medication at this dose.  Patient tolerating well.  He has lost 16 pounds in the last month.  Fasting blood sugars at goal less than 120   Other orders -     Doxycycline Hyclate; Take 1 tablet (100 mg total) by mouth 2 (two) times daily.  Dispense: 20 tablet; Refill: 0 -     Ozempic (0.25 or 0.5 MG/DOSE); Inject 0.5 mg as directed once a week.  Dispense: 3 mL; Refill: 11    Keep follow-up as scheduled for diabetes and labs in March.  [name], MD

## 2022-10-03 NOTE — Patient Instructions (Signed)
Complete a course of doxy x 1-0 days for possible right leg cellulitis.  Use warm compresses on right upper lip.  Can use heel cups on heels if needed.  Wash area daily with warm soapy water. Can apply topical antibiotic cream and bandaid to left heel.

## 2022-10-03 NOTE — Assessment & Plan Note (Signed)
Chronic Significant improvement with start of Ozempic, now on 0.5 mg weekly.  Refilled medication at this dose.  Patient tolerating well.  He has lost 16 pounds in the last month.  Fasting blood sugars at goal less than 120

## 2022-10-03 NOTE — Assessment & Plan Note (Signed)
Acute, possibly early cellulitis right lower leg.  Will treat with doxycycline given history of MRSA.  Encourage patient to elevate leg as able.

## 2022-10-03 NOTE — Assessment & Plan Note (Signed)
Status postdebridement with podiatry.  Well-healing area.  Recommended continuing to wash with warm soapy water.  No clear need for bandage but if he prefers extra support he can use a heel cup or Band-Aid with Neosporin.

## 2022-10-04 NOTE — Progress Notes (Signed)
I see free t3 and t4 ordered.. will await results.

## 2022-10-10 ENCOUNTER — Telehealth: Payer: Self-pay

## 2022-10-10 NOTE — Progress Notes (Signed)
Care Management & Coordination Services Pharmacy Team  Reason for Encounter: Appointment Reminder  Contacted patient to confirm telephone appointment with Charlene Brooke,   PharmD, on 10/13/22 at 11:00. Unsuccessful outreach. Left voicemail for patient to return call.  Have you seen any other providers since your last visit with PCP? Yes- Telemedicine-Gastroenterology 10/03/22   Hospital visits:  None in previous 6 months   Star Rating Drugs:  Medication:  Last Fill: Day Supply Metformin '500mg'$  09/07/22 90 Rosuvastatin '20mg'$  08/06/22 90 Ozempic  10/03/22 42   Care Gaps: Annual wellness visit in last year? Yes  If Diabetic: Last eye exam / retinopathy screening:   sched 02/2023 Last diabetic foot exam:2023   Charlene Brooke, PharmD notified  Avel Sensor, [place] Assistant [phone]

## 2022-10-12 ENCOUNTER — Encounter: Payer: Self-pay | Admitting: Gastroenterology

## 2022-10-13 ENCOUNTER — Other Ambulatory Visit: Payer: Self-pay

## 2022-10-13 ENCOUNTER — Ambulatory Visit: Payer: Medicare PPO | Admitting: Pharmacist

## 2022-10-13 MED ORDER — TRULANCE 3 MG PO TABS: 1.0000 | ORAL_TABLET | Freq: Every day | ORAL | 3 refills | Status: DC

## 2022-10-13 NOTE — Progress Notes (Signed)
Encounter details: CCM Time Spent     None       As the supervising physician/advanced practice provider, I certify that I have collaborated with the care management team and personally reviewed and directed the care plan for this patient's chronic conditions.

## 2022-10-13 NOTE — Patient Instructions (Signed)
Visit Information  Phone number for Pharmacist: [phone]  Thank you for meeting with me to discuss your medications! Below is a summary of what we talked about during the visit:   Recommendations/Changes made from today's visit: -Advised to check BG 2-hrs after a meal as well -Advised it is reasonable to continue metformin 1000 mg/day and Ozempic 0.5 mg weekly until PCP f/u/repeat A1c  Follow up plan: -Health Concierge will call patient 2 months for DM update -Pharmacist follow up televisit scheduled for 3 months -PCP appt 11/17/22   Charlene Brooke, PharmD, BCACP Clinical Pharmacist [facility] at [facility] [phone]

## 2022-10-13 NOTE — Progress Notes (Signed)
Care Management & Coordination Services Pharmacy Note  10/13/2022 Name:  Jamie Finley MRN:  [id number] DOB:  04/18/1961  Summary: F/U visit -DM: A1c 12.4% (08/2022); pt has started Ozempic and made dietary changes, he reports fasting BG is much improved and he has cut back on metformin from 4 to 2 tablets daily; fasting BG 107 today, he is not checking other times   Recommendations/Changes made from today's visit: -Advised to check BG 2-hrs after a meal as well -Advised it is reasonable to continue metformin 1000 mg/day and Ozempic 0.5 mg weekly until PCP f/u/repeat A1c  Follow up plan: -Health Concierge will call patient 2 months for DM update -Pharmacist follow up televisit scheduled for 3 months -PCP appt 11/17/22    Subjective: Jamie Finley is an 62 y.o. year old male who is a primary patient of Bedsole, Amy E, MD.  The care coordination team was consulted for assistance with disease management and care coordination needs.    Engaged with patient by telephone for follow up visit.  Recent office visits: 10/03/22 Dr Diona Browner OV: cellultis of R lower leg - rx doxy. Update Ozempic to 0.5 mg.  08/29/22 Dr Diona Browner OV: abscess - rx doxycycline.  08/18/22 Dr Diona Browner OV: A1c 12.5%; restart glipizide 10 mg. Pursue GLP-1 RA   05/19/22 Dr Diona Browner OV: A1c 9.2% off of glipizide/Actos. F/u 3 months.  Recent consult visits: 10/12/21 GI pt message - Amitiza failed, Trulance now approved. 10/03/22 Dr Vicente Males [facility] (GI): f/u - continue Amitiza. F/u with PCP for TSH elevation.  09/29/22 Podiatry - L heel ulceration. Discussed offloading pressure with footwear.  09/12/22 LCSW - Meals on Wheels referral. 09/12/22 Podiatry - DM foot eval.  08/28/22 Dr Vicente Males (GI): chronic constipation - tried Miralax, Benefiber. Increase dietary fiber to 35 g. Trial Trulance samples. TSH 6.6, need T3/T4.  04/17/22 Nutrition - diabetes counseling.   Hospital visits: None in previous 6 months   Objective:  Lab Results   Component Value Date   CREATININE 1.21 05/11/2022   BUN 13 05/11/2022   GFR 64.62 05/11/2022   GFRNONAA >60 02/11/2022   GFRAA >60 02/11/2019   NA 134 (L) 05/11/2022   K 3.5 05/11/2022   CALCIUM 9.4 05/11/2022   CO2 31 05/11/2022   GLUCOSE 248 (H) 05/11/2022    Lab Results  Component Value Date/Time   HGBA1C 12.4 (A) 08/18/2022 10:42 AM   HGBA1C 9.2 (H) 05/11/2022 09:26 AM   HGBA1C 5.7 (A) 02/16/2022 03:03 PM   HGBA1C 6.7 (H) 11/09/2021 10:06 AM   GFR 64.62 05/11/2022 09:26 AM   GFR 76.30 04/26/2021 09:53 AM   MICROALBUR 2.7 (H) 05/11/2022 04:57 PM   MICROALBUR <0.7 02/21/2019 09:45 AM    Last diabetic Eye exam:  Lab Results  Component Value Date/Time   HMDIABEYEEXA No Retinopathy 02/22/2022 12:00 AM    Last diabetic Foot exam:  Lab Results  Component Value Date/Time   HMDIABFOOTEX done 07/18/2021 12:00 AM     Lab Results  Component Value Date   CHOL 149 05/11/2022   HDL 40.30 05/11/2022   LDLCALC 62 03/02/2020   LDLDIRECT 81.0 05/11/2022   TRIG 236.0 (H) 05/11/2022   CHOLHDL 4 05/11/2022       Latest Ref Rng & Units 05/11/2022    9:26 AM 07/13/2021    4:09 AM 04/26/2021    9:53 AM  Hepatic Function  Total Protein 6.0 - 8.3 g/dL 7.5  8.0  7.5   Albumin 3.5 - 5.2 g/dL 4.0  4.2  4.2   AST 0 - 37 U/L '15  20  17   '$ ALT 0 - 53 U/L '14  15  14   '$ Alk Phosphatase 39 - 117 U/L 77  62  54   Total Bilirubin 0.2 - 1.2 mg/dL 0.8  1.1  0.8     Lab Results  Component Value Date/Time   TSH 6.680 (H) 08/28/2022 02:47 PM   TSH 4.22 07/17/2017 11:34 AM       Latest Ref Rng & Units 02/11/2022    7:53 PM 07/13/2021    4:09 AM 06/04/2021    6:35 AM  CBC  WBC 4.0 - 10.5 K/uL 8.5  10.1  8.0   Hemoglobin 13.0 - 17.0 g/dL 13.3  13.2  13.3   Hematocrit 39.0 - 52.0 % 41.8  38.4  39.1   Platelets 150 - 400 K/uL 258  277  245     Lab Results  Component Value Date/Time   VD25OH 63 07/17/2017 11:34 AM   VD25OH 62 06/13/2017 03:12 PM   VITAMINB12 351 06/13/2017 03:12 PM     Clinical ASCVD: No  The 10-year ASCVD risk score (Arnett DK, et al., 2019) is: 15.8%   Values used to calculate the score:     Age: 23 years     Sex: Male     Is Non-Hispanic African American: No     Diabetic: Yes     Tobacco smoker: No     Systolic Blood Pressure: 893 mmHg     Is BP treated: Yes     HDL Cholesterol: 40.3 mg/dL     Total Cholesterol: 149 mg/dL        10/03/2022   10:47 AM 09/12/2022    3:15 PM 09/08/2022    2:12 PM  Depression screen PHQ 2/9  Decreased Interest 1 0 1  Down, Depressed, Hopeless 1 0 1  PHQ - 2 Score 2 0 2  Altered sleeping 0  2  Tired, decreased energy 1  1  Change in appetite 0  0  Feeling bad or failure about yourself  0  0  Trouble concentrating 1  0  Moving slowly or fidgety/restless 0  0  Suicidal thoughts 0  0  PHQ-9 Score 4  5  Difficult doing work/chores Somewhat difficult  Somewhat difficult     Social History   Tobacco Use  Smoking Status Former   Packs/day: 1.00   Years: 5.00   Total pack years: 5.00   Types: Cigarettes   Quit date: 09/11/1989   Years since quitting: 33.1  Smokeless Tobacco Never   BP Readings from Last 3 Encounters:  10/03/22 118/66  09/29/22 132/74  09/12/22 132/74   Pulse Readings from Last 3 Encounters:  10/03/22 76  09/12/22 80  08/29/22 78   Wt Readings from Last 3 Encounters:  10/03/22 (!) 310 lb (140.6 kg)  08/29/22 (!) 326 lb (147.9 kg)  08/18/22 (!) 326 lb 4 oz (148 kg)   BMI Readings from Last 3 Encounters:  10/03/22 39.80 kg/m  08/29/22 41.86 kg/m  08/29/22 41.89 kg/m    No Known Allergies  Medications Reviewed Today     Reviewed by Zackory Haws, RPH (Pharmacist) on 10/13/22 at 1154  Med List Status: <None>   Medication Order Taking? Sig Documenting Provider Last Dose Status Informant  Accu-Chek Softclix Lancets lancets [id number] Yes Use to check fasting blood sugar daily Bedsole, Amy E, MD Taking Active   acetaminophen (TYLENOL) 325 MG tablet [id number] Yes  Take 325 mg by mouth every 4 (four) hours as needed.  [provider] Taking Active   ammonium lactate (AMLACTIN) 12 % lotion [id number] Yes Apply 1 Application topically as needed for dry skin. Felipa Furnace, DPM Taking Active   aspirin 81 MG tablet [id number] Yes Take 81 mg by mouth daily. Roselee Nova, MD Taking Active Self  blood glucose meter kit and supplies [id number] Yes Dispense based on patient and insurance preference. Use to check blood sugar two times a day. [name], MD Taking Active   Blood Glucose Monitoring Suppl (ACCU-CHEK GUIDE) w/Device KIT [id number] Yes Use to check blood fasting blood sugar daily Bedsole, Amy E, MD Taking Active   Cholecalciferol (VITAMIN D3) 50 MCG (2000 UT) TABS [id number] Yes Take 5-7 tablets by mouth daily. [provider] Taking Active   colchicine 0.6 MG tablet [id number] Yes Take 1 tablet (0.6 mg total) by mouth daily as needed. [name], MD Taking Active   doxycycline (VIBRA-TABS) 100 MG tablet [id number] Yes Take 1 tablet (100 mg total) by mouth 2 (two) times daily. [name], MD Taking Active   gabapentin (NEURONTIN) 400 MG capsule [id number] Yes Take 1 capsule (400 mg total) by mouth 2 (two) times daily as needed. [name], MD Taking Active   glucose blood (ACCU-CHEK GUIDE) test strip [id number] Yes Use to check blood fasting blood sugar daily Bedsole, Amy E, MD Taking Active   hydrochlorothiazide (HYDRODIURIL) 25 MG tablet [id number] Yes Take 1 tablet (25 mg total) by mouth daily as needed. [name], MD Taking Active   metFORMIN (GLUCOPHAGE-XR) 500 MG 24 hr tablet [id number] Yes TAKE 2 TABLETS BY MOUTH IN THE MORNING AND AT BEDTIME  Patient taking differently: Take 500 mg by mouth 2 (two) times daily with a meal.   Bedsole, Amy E, MD Taking Active   methocarbamol (ROBAXIN) 750 MG tablet [id number] Yes Take 1 tablet (750 mg total) by mouth daily as needed for muscle spasms. [name], MD Taking Active    Multiple Vitamins-Minerals (ZINC PO) [id number] Yes Take 1 tablet by mouth daily. [provider] Taking Active   Plecanatide (TRULANCE) 3 MG TABS [id number] Yes Take 1 tablet (3 mg total) by mouth daily. Jonathon Bellows, MD Taking Active   polyethylene glycol (MIRALAX / GLYCOLAX) 17 g packet [id number] Yes Take 17 g by mouth daily. [provider] Taking Active   rosuvastatin (CRESTOR) 20 MG tablet [id number] Yes TAKE 1 TABLET BY MOUTH AT BEDTIME Bedsole, Amy E, MD Taking Active   Semaglutide,0.25 or 0.'5MG'$ /DOS, (OZEMPIC, 0.25 OR 0.5 MG/DOSE,) 2 MG/3ML SOPN [id number] Yes Inject 0.5 mg as directed once a week. [name], MD Taking Active   sildenafil (REVATIO) 20 MG tablet [id number] Yes Take 5 tablets prior to sexual activity Diona Browner, Amy E, MD Taking Active             SDOH:  (Social Determinants of Health) assessments and interventions performed: No SDOH Interventions    Flowsheet Row Care Coordination from 09/12/2022 in [place] Management from 09/08/2022 in [place] at [place] from 08/29/2022 in [place] at [place] Management from 08/24/2022 in [place] at [facility] Visit from 02/16/2022 in [place] at [place]  SDOH Interventions       Food Insecurity Interventions Other (Comment)  Derald Macleod to meals on wheels  referral] Intervention Not Indicated Intervention Not Indicated AMB Referral  [coordinate referral for SDOH] --  Housing Interventions Intervention Not Indicated Intervention Not Indicated Intervention Not Indicated Intervention Not Indicated --  Transportation Interventions -- Intervention Not Indicated Intervention Not Indicated Intervention Not Indicated --  Utilities Interventions -- Intervention Not Indicated Intervention Not Indicated -- --  Alcohol Usage Interventions  -- -- Intervention Not Indicated (Score <7) -- --  Depression Interventions/Treatment  -- --  [pt went to counseling this year, has now stopped now, pt refuses social work] -- -- Counseling  Financial Strain Interventions -- Intervention Not Indicated Intervention Not Indicated Intervention Not Indicated  [patient has Medicare/Medicaid] --  Physical Activity Interventions -- Patient Refused Patient Refused Other (Comments)  [patient is wheelchair bound] --  Stress Interventions -- Intervention Not Indicated Intervention Not Indicated -- --  Social Connections Interventions -- Intervention Not Indicated Intervention Not Indicated -- --       Medication Assistance: None required.  Patient affirms current coverage meets needs. - Medicare/Medicaid  Medication Access: Within the past 30 days, how often has patient missed a dose of medication? 0 Is a pillbox or other method used to improve adherence? Yes  Factors that may affect medication adherence? no barriers identified Are meds synced by current pharmacy? No  Are meds delivered by current pharmacy? No  Does patient experience delays in picking up medications due to transportation concerns? No   [facility] Services Reviewed: Is patient disadvantaged to use [facility]?: No  Current Rx insurance plan: [employer]/Medicaid Name and location of Current pharmacy:  [facility] [address] [place]) [place] [address] Phone: [phone] Fax: [phone]  [facility] services reviewed with patient today?: No  Patient requests to transfer care to [facility] Pharmacy?: No  Reason patient declined to change pharmacies: Not mentioned at this visit  Compliance/Adherence/Medication fill history: Care Gaps: Foot exam (due 07/2022, podiatry referral 08/18/22)  Star-Rating Drugs: Metformin - PDC Rosuvastatin - PDC Ozempic - PDC   Assessment/Plan   Hypertension  (BP goal <130/80) -Controlled - BP low end of normal sometimes -Current treatment: HCTZ 25 mg daily PRN - Appropriate, Effective, Safe, Accessible -Medications previously tried: n/a  -Denies hypotensive/hypertensive symptoms -Counseled to monitor BP at home periodically -Recommended to continue current medication  Hyperlipidemia: (LDL goal < 70) -Not ideally controlled - LDL 81 (04/2022) above goal -Hx CVA, previously on clopiodgrel -Current treatment: Rosuvastatin 20 mg daily -Appropriate, Query Effective Aspirin 81 mg daily - Appropriate, Effective, Safe, Accessible -Medications previously tried: n/a -Educated on Cholesterol goals; Benefits of statin for ASCVD risk reduction; Importance of limiting foods high in cholesterol; -Recommended to continue current medication; consider ezetimibe in future  Diabetes (A1c goal <7%) -Uncontrolled - A1c 12.4% (08/2022); pt has started Ozempic and changed diet significantly since then; he reports eating much less than he used to, he has lost 16 lbs; he did reduce metformin dose from 4 to 2 tablets daily due to lower glucose readings -DM Dx ~2016 -Current home glucose readings: fasting 107 -Denies hypoglycemic/hyperglycemic symptoms -Current medications: Metformin ER 500 mg - 1 tab BID - Appropriate, Query Effective Ozempic 0.5 mg weekly - Appropriate, Query effective Testing supplies -Medications previously tried: Actos, glipizide   -Current meal patterns: in wheelchair he cannot reach stove to cook, so he eats out often (fast food); drinks: water, unsweet tea; he has cut back significantly on Cheerwine/sodas -Current exercise: none (wheelchair bound) -Educated on A1c and blood  sugar goals; Complications of diabetes including kidney damage, retinal damage, and cardiovascular disease; Benefits of weight loss;  -Discussed internal processes that regulate blood sugar (ie, why sugar can be elevated without eating anything); discussed importance of  dietary changes to improve A1c -Reviewed GLP-1 RA; discussed nausea is most common side effect, discussed diet changes can mitigate nausea -Recommend to continue current medication; repeat A1c at next [place] Maintenance -Vaccine gaps: TDAP, Shingrix -Hx DVT, 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

## 2022-10-16 IMAGING — CT CT HEAD W/O CM
3 series · 16 of 47 positions shown, 19 images · non-contrast
Comparison: 01/15/2016

CLINICAL DATA: Head pressure, hypertension, altered vision

EXAM:
CT HEAD WITHOUT CONTRAST
TECHNIQUE: Contiguous axial images were obtained from the base of the skull
through the vertex without intravenous contrast.

[Series 3: head wo · axial · 0.47mm/px · z∈[-122,+23]mm · 10 of 35 slices shown, 13 images]
[im 3/35  brain]
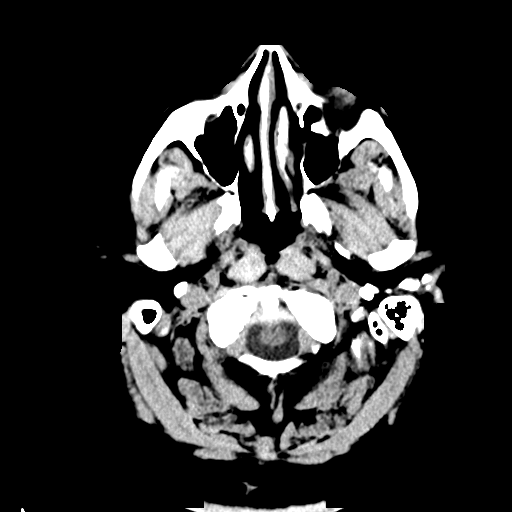
[im 3/35  bone]
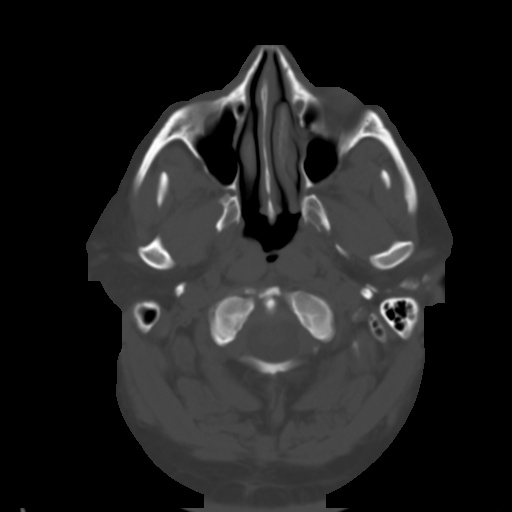
[im 6/35  brain]
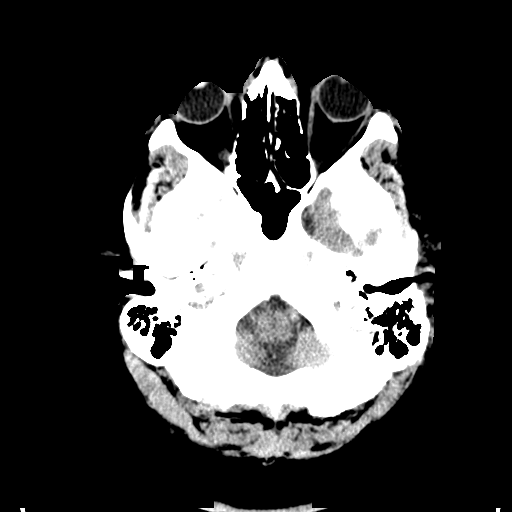
[im 10/35  brain]
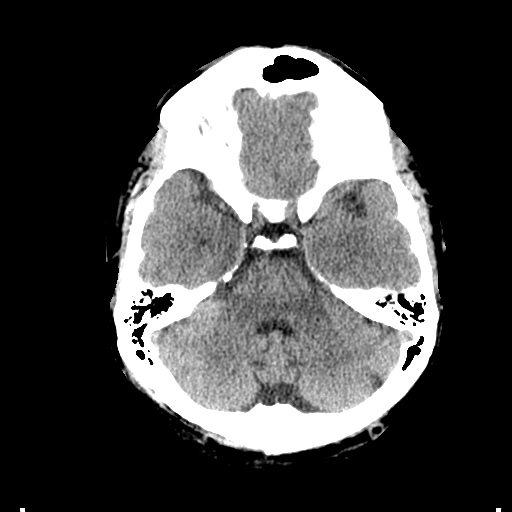
[im 12/35  brain]
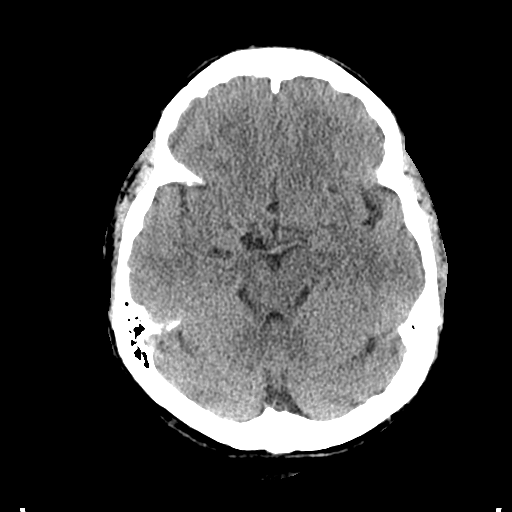
[im 16/35  brain]
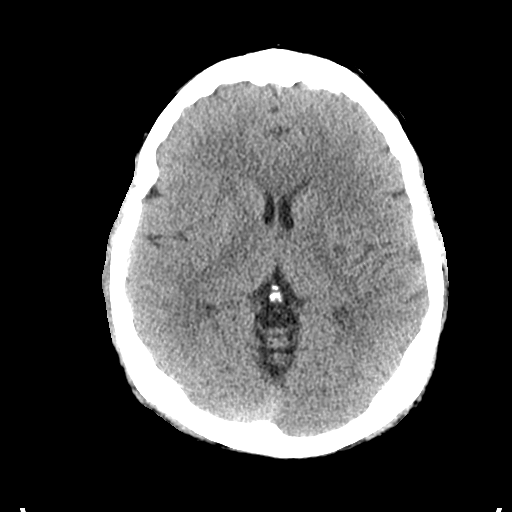
[im 16/35  bone]
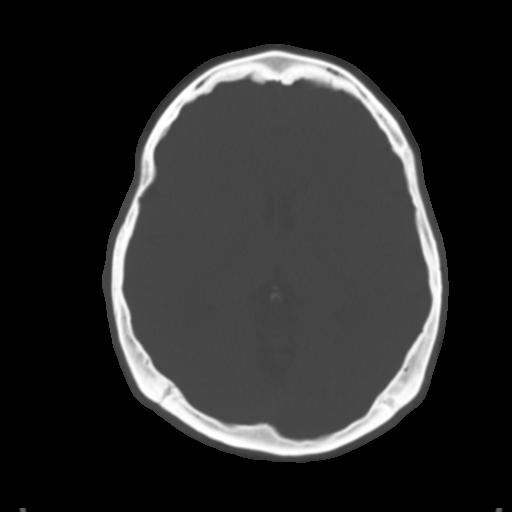
[im 19/35  brain]
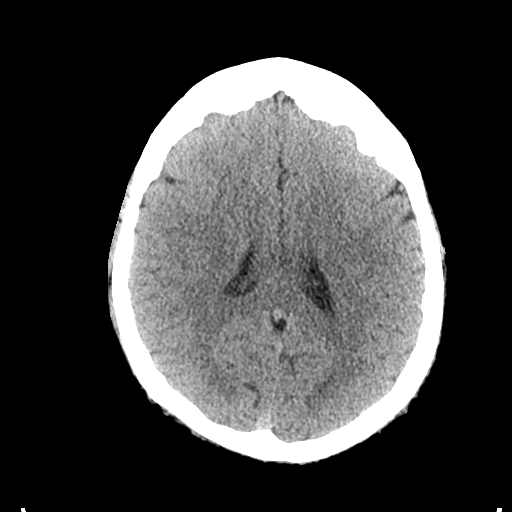
[im 23/35  brain]
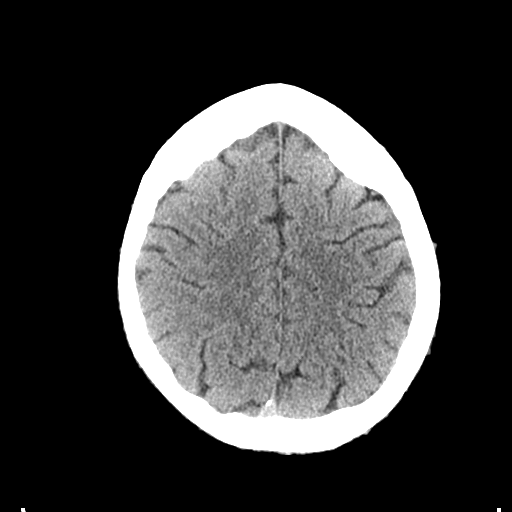
[im 26/35  brain]
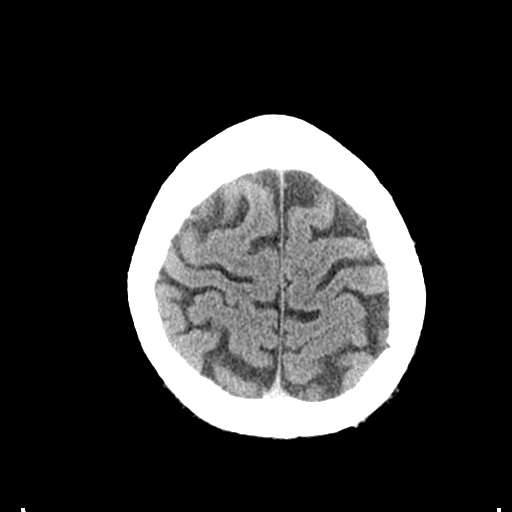
[im 29/35  brain]
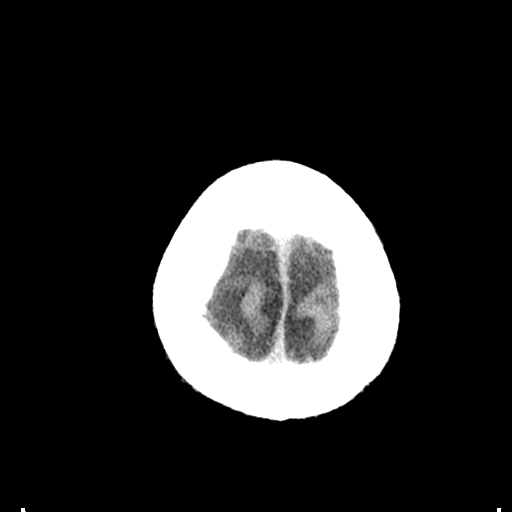
[im 29/35  bone]
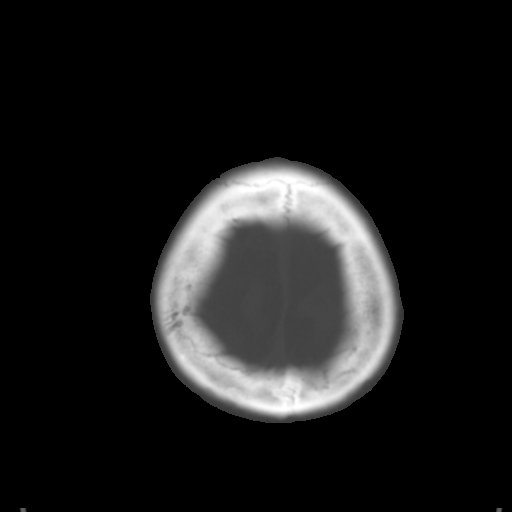
[im 32/35  brain]
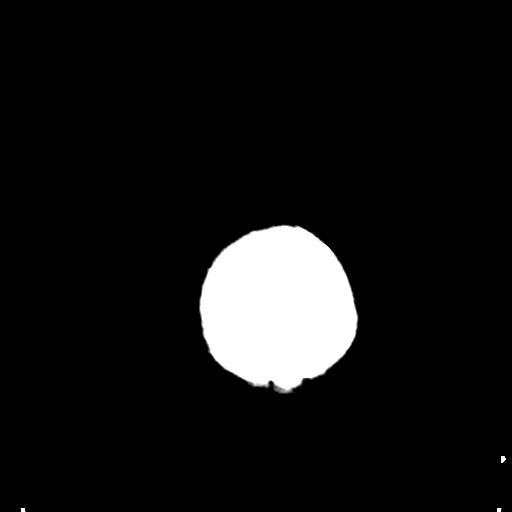

[Series 4: coronal soft tissue · coronal · 0.35mm/px · 3 of 76 slices shown]
[im 26/76  brain]
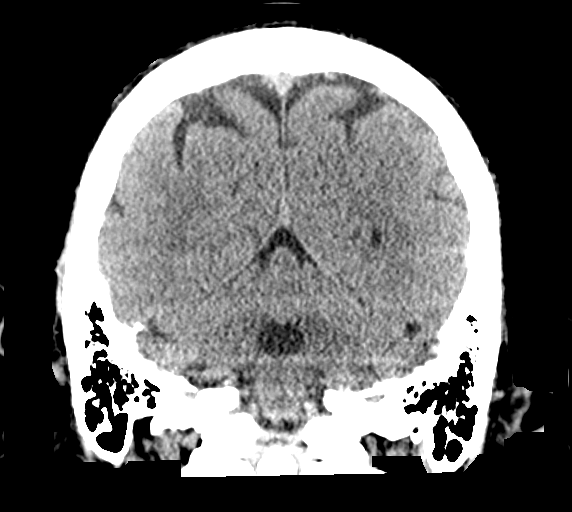
[im 34/76  brain]
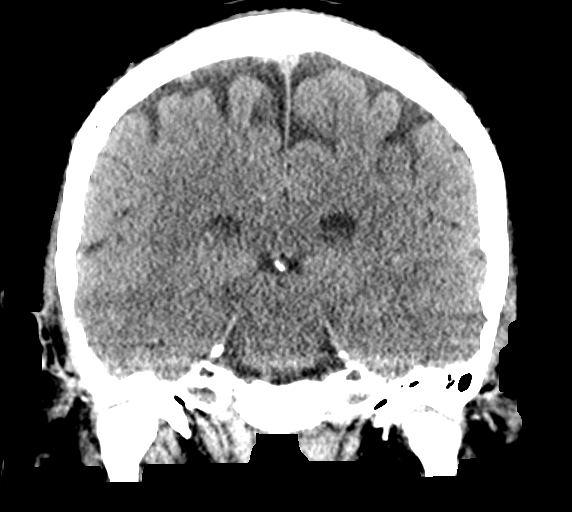
[im 42/76  brain]
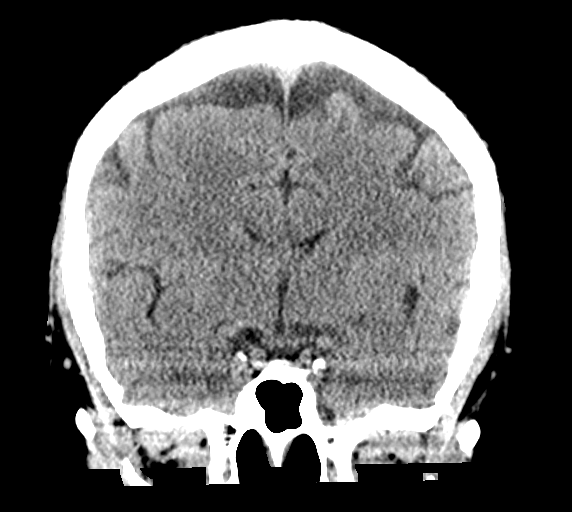

[Series 5: sagittal soft tissue · sagittal · 0.35mm/px · 3 of 67 slices shown]
[im 23/67  brain]
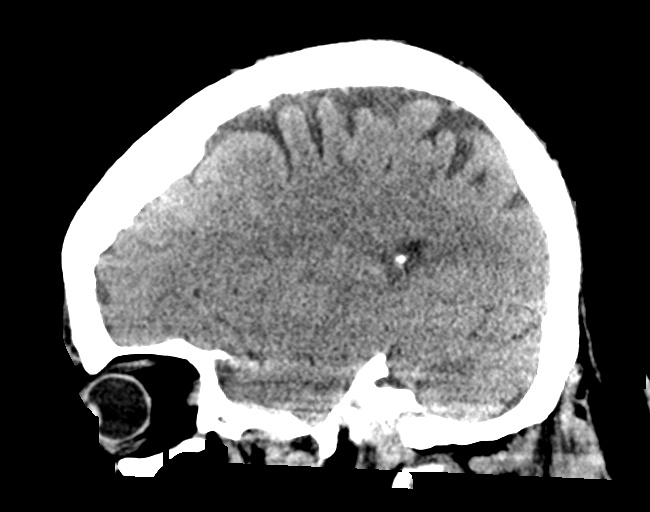
[im 34/67  brain]
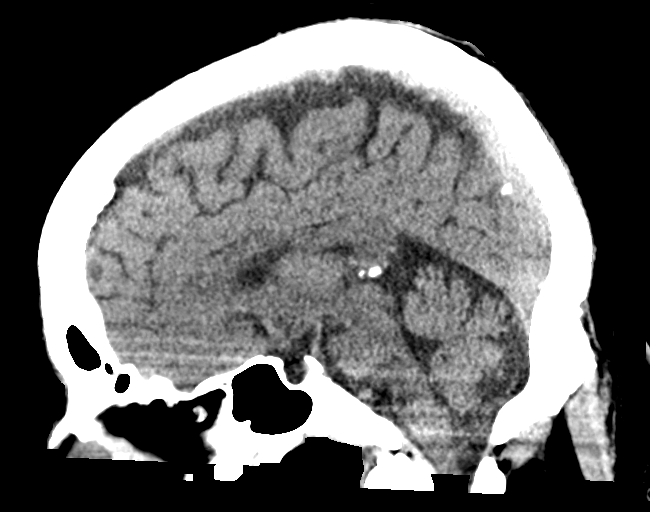
[im 45/67  brain]
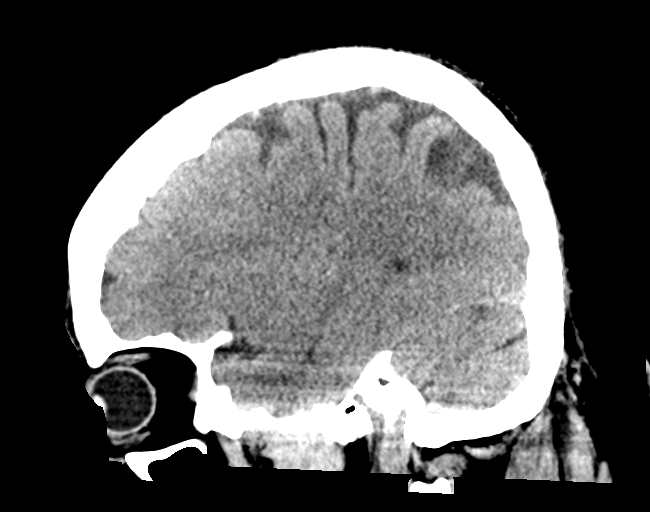

[16 of 47 positions shown; findings below may reference images not displayed]

FINDINGS: Brain: No acute infarct or hemorrhage. Lateral ventricles and
midline structures are unremarkable. No acute extra-axial fluid
collections. No mass effect.

Vascular: No hyperdense vessel or unexpected calcification.

Skull: Normal. Negative for fracture or focal lesion.

Sinuses/Orbits: No acute finding.

Other: None.
IMPRESSION: 1. No acute intracranial process.

## 2022-10-21 DIAGNOSIS — Z8673 Personal history of transient ischemic attack (TIA), and cerebral infarction without residual deficits: Secondary | ICD-10-CM | POA: Diagnosis not present

## 2022-10-21 DIAGNOSIS — Z7689 Persons encountering health services in other specified circumstances: Secondary | ICD-10-CM | POA: Diagnosis not present

## 2022-10-21 DIAGNOSIS — G834 Cauda equina syndrome: Secondary | ICD-10-CM | POA: Diagnosis not present

## 2022-10-24 DIAGNOSIS — H16223 Keratoconjunctivitis sicca, not specified as Sjogren's, bilateral: Secondary | ICD-10-CM | POA: Diagnosis not present

## 2022-10-31 ENCOUNTER — Ambulatory Visit (INDEPENDENT_AMBULATORY_CARE_PROVIDER_SITE_OTHER): Payer: Medicare PPO | Admitting: *Deleted

## 2022-10-31 DIAGNOSIS — E1149 Type 2 diabetes mellitus with other diabetic neurological complication: Secondary | ICD-10-CM

## 2022-10-31 DIAGNOSIS — I152 Hypertension secondary to endocrine disorders: Secondary | ICD-10-CM

## 2022-10-31 NOTE — Patient Instructions (Signed)
Please call the care guide team at [phone] if you need to cancel or reschedule your appointment.   If you are experiencing a Mental Health or [place] or need someone to talk to, please call the Suicide and Crisis Lifeline: 988 call the [place] National Suicide Prevention Lifeline: (937) 136-0907 or TTY: 308-104-4773 TTY 701-835-1707) to talk to a trained counselor call 1-800-273-TALK (toll free, 24 hour hotline) go to [facility] [address] [place] [phone]) call 911   Following is a copy of the CCM Program Consent:  CCM service includes personalized support from designated clinical staff supervised by the physician, including individualized plan of care and coordination with other care providers 24/7 contact phone numbers for assistance for urgent and routine care needs. Service will only be billed when office clinical staff spend 20 minutes or more in a month to coordinate care. Only one practitioner may furnish and bill the service in a calendar month. The patient may stop CCM services at amy time (effective at the end of the month) by phone call to the office staff. The patient will be responsible for cost sharing (co-pay) or up to 20% of the service fee (after annual deductible is met)  Following is a copy of your full provider care plan:   Goals Addressed             This Visit's Progress    CCM (DIABETES) EXPECTED OUTCOME:  MONITOR, SELF-MANAGE AND REDUCE SYMPTOMS OF DIABETES       Current Barriers:  Knowledge Deficits related to Diabetes management Chronic Disease Management support and education needs related to Diabetes, diet No Advanced Directives in place- pt declines information Patient reports he lives alone, has pastor from his church he can call on if needed, states overall independent with ADL, IADL's, continues to drive, has difficulty sometimes standing up too long for cooking and washing dishes, has  WC, ramp, cane walker and always uses DME when ambulating, states continues to get out and be active. Patient reports he has been checking CBG fasting once daily with readings 92-121, reports "ozempic has helped more than anything" pt states he does not always follow a special diet, was drinking sugary drinks, recently switched to diet drinks. Pt reports he has lost 16 pounds while taking ozempic and would like to lose more. PHQ9=5, pt states he saw counselor this year but no longer goes, social work/ counseling services offered, pt declined. Patient reports 3 falls this past year with no injury  Planned Interventions: Reviewed medications with patient and discussed importance of medication adherence;        Reviewed prescribed diet with patient carbohydrate modified; Counseled on importance of regular laboratory monitoring as prescribed;        Advised patient, providing education and rationale, to check cbg per MD order  and record        Review of patient status, including review of consultants reports, relevant laboratory and other test results, and medications completed;       Advised patient to discuss any issues with blood sugar, medications/ side effects with provider;      Safety precautions reinforced Reviewed importance of exercise  Symptom Management: Take medications as prescribed   Attend all scheduled provider appointments Call pharmacy for medication refills 3-7 days in advance of running out of medications Attend church or other social activities Perform all self care activities independently  Perform IADL's (shopping, preparing meals, housekeeping, managing finances) independently Call provider office for  new concerns or questions  check blood sugar at prescribed times: per doctor's order  check feet daily for cuts, sores or redness enter blood sugar readings and medication or insulin into daily log take the blood sugar log to all doctor visits take the blood sugar meter  to all doctor visits set goal weight trim toenails straight across fill half of plate with vegetables limit fast food meals to no more than 1 per week manage portion size prepare main meal at home 3 to 5 days each week read food labels for fat, fiber, carbohydrates and portion size wash and dry feet carefully every day wear comfortable, cotton socks Continue to work towards your goal of weight loss fall prevention strategies: change position slowly, use assistive device such as walker or cane (per provider recommendations) when walking, keep walkways clear, have good lighting in room. It is important to contact your provider if you have any falls, maintain muscle strength/tone by exercise per provider recommendations.  Follow Up Plan: Telephone follow up appointment with care management team member scheduled for:  01/09/23 at 130 pm       CCM (HYPERTENSION) EXPECTED OUTCOME: MONITOR, SELF-MANAGE AND REDUCE SYMPTOMS OF HYPERTENSION       Current Barriers:  Knowledge Deficits related to Hypertension management Chronic Disease Management support and education needs related to Hypertension, diet No Advanced Directives in place- pt declines Patient reports he has a wrist cuff but does not monitor blood pressure at home, states " it's always usually good"  Planned Interventions: Evaluation of current treatment plan related to hypertension self management and patient's adherence to plan as established by provider;   Reviewed prescribed diet low sodium Reviewed medications with patient and discussed importance of compliance;  Counseled on the importance of exercise goals with target of 150 minutes per week Advised patient, providing education and rationale, to monitor blood pressure daily and record, calling PCP for findings outside established parameters;  Advised patient to discuss any issues with blood pressure, medications with provider; Discussed complications of poorly controlled blood  pressure such as heart disease, stroke, circulatory complications, vision complications, kidney impairment, sexual dysfunction;  Reinforced importance of adherence to low sodium diet  Symptom Management: Take medications as prescribed   Attend all scheduled provider appointments Call pharmacy for medication refills 3-7 days in advance of running out of medications Attend church or other social activities Perform all self care activities independently  Perform IADL's (shopping, preparing meals, housekeeping, managing finances) independently Call provider office for new concerns or questions  check blood pressure weekly choose a place to take my blood pressure (home, clinic or office, retail store) write blood pressure results in a log or diary learn about high blood pressure keep a blood pressure log take blood pressure log to all doctor appointments keep all doctor appointments take medications for blood pressure exactly as prescribed eat more whole grains, fruits and vegetables, lean meats and healthy fats Follow low sodium diet Read labels for sodium content  Follow Up Plan: Telephone follow up appointment with care management team member scheduled for:   01/09/23 at 130 pm          Patient verbalizes understanding of instructions and care plan provided today and agrees to view in [place]. Active MyChart status and patient understanding of how to access instructions and care plan via MyChart confirmed with patient.     Telephone follow up appointment with care management team member scheduled for: 01/09/23 at 430 pm

## 2022-10-31 NOTE — Chronic Care Management (AMB) (Signed)
Chronic Care Management   CCM RN Visit Note  10/31/2022 Name: Jamie Finley MRN: [id number] DOB: [birth year]  Subjective: Jamie Finley is a 62 y.o. year old male who is a primary care patient of Bedsole, Amy E, MD. The patient was referred to the Chronic Care Management team for assistance with care management needs subsequent to provider initiation of CCM services and plan of care.    Today's Visit:  Engaged with patient by telephone for follow up visit.        Goals Addressed             This Visit's Progress    CCM (DIABETES) EXPECTED OUTCOME:  MONITOR, SELF-MANAGE AND REDUCE SYMPTOMS OF DIABETES       Current Barriers:  Knowledge Deficits related to Diabetes management Chronic Disease Management support and education needs related to Diabetes, diet No Advanced Directives in place- pt declines information Patient reports he lives alone, has pastor from his church he can call on if needed, states overall independent with ADL, IADL's, continues to drive, has difficulty sometimes standing up too long for cooking and washing dishes, has WC, ramp, cane walker and always uses DME when ambulating, states continues to get out and be active. Patient reports he has been checking CBG fasting once daily with readings 92-121, reports "ozempic has helped more than anything" pt states he does not always follow a special diet, was drinking sugary drinks, recently switched to diet drinks. Pt reports he has lost 16 pounds while taking ozempic and would like to lose more. PHQ9=5, pt states he saw counselor this year but no longer goes, social work/ counseling services offered, pt declined. Patient reports 3 falls this past year with no injury  Planned Interventions: Reviewed medications with patient and discussed importance of medication adherence;        Reviewed prescribed diet with patient carbohydrate modified; Counseled on importance of regular laboratory monitoring as prescribed;         Advised patient, providing education and rationale, to check cbg per MD order  and record        Review of patient status, including review of consultants reports, relevant laboratory and other test results, and medications completed;       Advised patient to discuss any issues with blood sugar, medications/ side effects with provider;      Safety precautions reinforced Reviewed importance of exercise  Symptom Management: Take medications as prescribed   Attend all scheduled provider appointments Call pharmacy for medication refills 3-7 days in advance of running out of medications Attend church or other social activities Perform all self care activities independently  Perform IADL's (shopping, preparing meals, housekeeping, managing finances) independently Call provider office for new concerns or questions  check blood sugar at prescribed times: per doctor's order  check feet daily for cuts, sores or redness enter blood sugar readings and medication or insulin into daily log take the blood sugar log to all doctor visits take the blood sugar meter to all doctor visits set goal weight trim toenails straight across fill half of plate with vegetables limit fast food meals to no more than 1 per week manage portion size prepare main meal at home 3 to 5 days each week read food labels for fat, fiber, carbohydrates and portion size wash and dry feet carefully every day wear comfortable, cotton socks Continue to work towards your goal of weight loss fall prevention strategies: change position slowly, use assistive device such as walker or cane (per  provider recommendations) when walking, keep walkways clear, have good lighting in room. It is important to contact your provider if you have any falls, maintain muscle strength/tone by exercise per provider recommendations.  Follow Up Plan: Telephone follow up appointment with care management team member scheduled for:  01/09/23 at 130 pm        CCM (HYPERTENSION) EXPECTED OUTCOME: MONITOR, SELF-MANAGE AND REDUCE SYMPTOMS OF HYPERTENSION       Current Barriers:  Knowledge Deficits related to Hypertension management Chronic Disease Management support and education needs related to Hypertension, diet No Advanced Directives in place- pt declines Patient reports he has a wrist cuff but does not monitor blood pressure at home, states " it's always usually good"  Planned Interventions: Evaluation of current treatment plan related to hypertension self management and patient's adherence to plan as established by provider;   Reviewed prescribed diet low sodium Reviewed medications with patient and discussed importance of compliance;  Counseled on the importance of exercise goals with target of 150 minutes per week Advised patient, providing education and rationale, to monitor blood pressure daily and record, calling PCP for findings outside established parameters;  Advised patient to discuss any issues with blood pressure, medications with provider; Discussed complications of poorly controlled blood pressure such as heart disease, stroke, circulatory complications, vision complications, kidney impairment, sexual dysfunction;  Reinforced importance of adherence to low sodium diet  Symptom Management: Take medications as prescribed   Attend all scheduled provider appointments Call pharmacy for medication refills 3-7 days in advance of running out of medications Attend church or other social activities Perform all self care activities independently  Perform IADL's (shopping, preparing meals, housekeeping, managing finances) independently Call provider office for new concerns or questions  check blood pressure weekly choose a place to take my blood pressure (home, clinic or office, retail store) write blood pressure results in a log or diary learn about high blood pressure keep a blood pressure log take blood pressure log to all doctor  appointments keep all doctor appointments take medications for blood pressure exactly as prescribed eat more whole grains, fruits and vegetables, lean meats and healthy fats Follow low sodium diet Read labels for sodium content  Follow Up Plan: Telephone follow up appointment with care management team member scheduled for:   01/09/23 at 130 pm          Plan:Telephone follow up appointment with care management team member scheduled for:  01/09/23 at 130 pm  Jacqlyn Larsen [facility], BSN RN Case Manager [place] [phone]

## 2022-11-03 ENCOUNTER — Encounter: Payer: Medicare PPO | Admitting: Pharmacist

## 2022-11-09 DIAGNOSIS — I10 Essential (primary) hypertension: Secondary | ICD-10-CM

## 2022-11-09 DIAGNOSIS — E1159 Type 2 diabetes mellitus with other circulatory complications: Secondary | ICD-10-CM | POA: Diagnosis not present

## 2022-11-17 ENCOUNTER — Encounter: Payer: Self-pay | Admitting: Family Medicine

## 2022-11-17 ENCOUNTER — Ambulatory Visit (INDEPENDENT_AMBULATORY_CARE_PROVIDER_SITE_OTHER): Payer: Medicare PPO | Admitting: Family Medicine

## 2022-11-17 VITALS — BP 100/74 | HR 72 | Temp 97.4°F | Ht 74.0 in | Wt 306.2 lb

## 2022-11-17 DIAGNOSIS — E1159 Type 2 diabetes mellitus with other circulatory complications: Secondary | ICD-10-CM

## 2022-11-17 DIAGNOSIS — E1149 Type 2 diabetes mellitus with other diabetic neurological complication: Secondary | ICD-10-CM | POA: Diagnosis not present

## 2022-11-17 DIAGNOSIS — G834 Cauda equina syndrome: Secondary | ICD-10-CM

## 2022-11-17 DIAGNOSIS — G8220 Paraplegia, unspecified: Secondary | ICD-10-CM | POA: Diagnosis not present

## 2022-11-17 DIAGNOSIS — I152 Hypertension secondary to endocrine disorders: Secondary | ICD-10-CM | POA: Diagnosis not present

## 2022-11-17 LAB — POCT GLYCOSYLATED HEMOGLOBIN (HGB A1C): Hemoglobin A1C: 7.6 % — AB (ref 4.0–5.6)

## 2022-11-17 MED ORDER — METFORMIN HCL ER 500 MG PO TB24: 500.0000 mg | ORAL_TABLET | Freq: Two times a day (BID) | ORAL | Status: DC

## 2022-11-17 NOTE — Progress Notes (Signed)
Patient ID: Jamie Finley, male    DOB: 27-Nov-1960, 62 y.o.   MRN: [id number]  This visit was conducted in person.  BP 100/74   Pulse 72   Temp (!) 97.4 F (36.3 C) (Temporal)   Ht '6\' 2"'$  (1.88 m)   Wt (!) 306 lb 4 oz (138.9 kg)   SpO2 97%   BMI 39.32 kg/m    CC:  Chief Complaint  Patient presents with   Diabetes    Subjective:   HPI: Jamie Finley is a 62 y.o. male presenting on 11/17/2022 for Diabetes  Diabetes:  Only taking metformin XR 500 mg 1 tablet BID S/P nutritionist  On semaglutide 0.5 mg weekly.Marland Kitchen dramatic improvement in A1C! Has noted some plateauing out in decreased appetite. He has been somewhat hungrier, smaller portion size.  Trying to make better coices. HAs cut back on soda.  No SE of  nausea   Some slower transit of stool.. BM every 3-4 days despite trulance for IBS C. Lab Results  Component Value Date   HGBA1C 7.6 (A) 11/17/2022  Using medications without difficulties: Hypoglycemic episodes: Hyperglycemic episodes: Feet problems: Blood Sugars averaging: eye exam within last year:   Planning on starting silver sneakers... plans pool cactivity.  He has lost 20 lbs in last 3 months. Wt Readings from Last 3 Encounters:  11/17/22 (!) 306 lb 4 oz (138.9 kg)  10/03/22 (!) 310 lb (140.6 kg)  08/29/22 (!) 326 lb (147.9 kg)     Hypertension:    HCTZ 25 mg daily prn BP Readings from Last 3 Encounters:  11/17/22 100/74  10/03/22 118/66  09/29/22 132/74  Using medication without problems or lightheadedness:  Chest pain with exertion: Edema: Short of breath: Average home BPs: Other issues:      Relevant past medical, surgical, family and social history reviewed and updated as indicated. Interim medical history since our last visit reviewed. Allergies and medications reviewed and updated. Outpatient Medications Prior to Visit  Medication Sig Dispense Refill   Accu-Chek Softclix Lancets lancets Use to check fasting blood sugar daily 100  each 3   acetaminophen (TYLENOL) 325 MG tablet Take 325 mg by mouth every 4 (four) hours as needed.      ammonium lactate (AMLACTIN) 12 % lotion Apply 1 Application topically as needed for dry skin. 400 g 0   aspirin 81 MG tablet Take 81 mg by mouth daily.     blood glucose meter kit and supplies Dispense based on patient and insurance preference. Use to check blood sugar two times a day. 1 each 0   Blood Glucose Monitoring Suppl (ACCU-CHEK GUIDE) w/Device KIT Use to check blood fasting blood sugar daily 1 kit 0   Cholecalciferol (VITAMIN D3) 50 MCG (2000 UT) TABS Take 5-7 tablets by mouth daily.     colchicine 0.6 MG tablet Take 1 tablet (0.6 mg total) by mouth daily as needed. 30 tablet 2   fluorometholone (FML) 0.1 % ophthalmic suspension Place 1 drop into both eyes 4 (four) times daily.     gabapentin (NEURONTIN) 400 MG capsule Take 1 capsule (400 mg total) by mouth 2 (two) times daily as needed. 60 capsule 3   glucose blood (ACCU-CHEK GUIDE) test strip Use to check blood fasting blood sugar daily 100 each 3   hydrochlorothiazide (HYDRODIURIL) 25 MG tablet Take 1 tablet (25 mg total) by mouth daily as needed. 90 tablet 1   methocarbamol (ROBAXIN) 750 MG tablet Take 1 tablet (750 mg total) by  mouth daily as needed for muscle spasms. 30 tablet 1   Multiple Vitamins-Minerals (ZINC PO) Take 1 tablet by mouth daily.     Plecanatide (TRULANCE) 3 MG TABS Take 1 tablet (3 mg total) by mouth daily. 90 tablet 3   polyethylene glycol (MIRALAX / GLYCOLAX) 17 g packet Take 17 g by mouth daily.     rosuvastatin (CRESTOR) 20 MG tablet TAKE 1 TABLET BY MOUTH AT BEDTIME 90 tablet 3   Semaglutide,0.25 or 0.'5MG'$ /DOS, (OZEMPIC, 0.25 OR 0.5 MG/DOSE,) 2 MG/3ML SOPN Inject 0.5 mg as directed once a week. 3 mL 11   sildenafil (REVATIO) 20 MG tablet Take 5 tablets prior to sexual activity 20 tablet 11   metFORMIN (GLUCOPHAGE-XR) 500 MG 24 hr tablet TAKE 2 TABLETS BY MOUTH IN THE MORNING AND AT BEDTIME (Patient taking  differently: Take 500 mg by mouth 2 (two) times daily with a meal.) 360 tablet 3   doxycycline (VIBRA-TABS) 100 MG tablet Take 1 tablet (100 mg total) by mouth 2 (two) times daily. 20 tablet 0   No facility-administered medications prior to visit.     Per HPI unless specifically indicated in ROS section below Review of Systems  Constitutional:  Negative for fatigue and fever.  HENT:  Negative for ear pain.   Eyes:  Negative for pain.  Respiratory:  Negative for cough and shortness of breath.   Cardiovascular:  Negative for chest pain, palpitations and leg swelling.  Gastrointestinal:  Negative for abdominal pain.  Genitourinary:  Negative for dysuria.  Musculoskeletal:  Negative for arthralgias.  Neurological:  Negative for syncope, light-headedness and headaches.  Psychiatric/Behavioral:  Negative for dysphoric mood.    Objective:  BP 100/74   Pulse 72   Temp (!) 97.4 F (36.3 C) (Temporal)   Ht '6\' 2"'$  (1.88 m)   Wt (!) 306 lb 4 oz (138.9 kg)   SpO2 97%   BMI 39.32 kg/m   Wt Readings from Last 3 Encounters:  11/17/22 (!) 306 lb 4 oz (138.9 kg)  10/03/22 (!) 310 lb (140.6 kg)  08/29/22 (!) 326 lb (147.9 kg)      Physical Exam Constitutional:      Appearance: He is well-developed.     Comments: In wheelchair  HENT:     Head: Normocephalic.     Right Ear: Hearing normal.     Left Ear: Hearing normal.     Nose: Nose normal.  Neck:     Thyroid: No thyroid mass or thyromegaly.     Vascular: No carotid bruit.     Trachea: Trachea normal.  Cardiovascular:     Rate and Rhythm: Normal rate and regular rhythm.     Pulses: Normal pulses.     Heart sounds: Heart sounds not distant. No murmur heard.    No friction rub. No gallop.     Comments:  Discolored bilateral legs with venous insufficiency Pulmonary:     Effort: Pulmonary effort is normal. No respiratory distress.     Breath sounds: Normal breath sounds.  Musculoskeletal:     Right lower leg: 1+ Pitting Edema  present.     Left lower leg: 1+ Pitting Edema present.  Skin:    General: Skin is warm and dry.     Findings: No rash.          Comments: 1 cm area of indurated erythematous tissue under left pannus on abdominal wall with central pustule, draining  Psychiatric:        Speech: Speech normal.  Behavior: Behavior normal.        Thought Content: Thought content normal.       Diabetic foot exam: Normal inspection No skin breakdown Calluses  on heels Normal DP pulses Normal sensation to light touch and monofilament Nails thickened  Results for orders placed or performed in visit on 11/17/22  POCT glycosylated hemoglobin (Hb A1C)  Result Value Ref Range   Hemoglobin A1C 7.6 (A) 4.0 - 5.6 %   HbA1c POC (<> result, manual entry)     HbA1c, POC (prediabetic range)     HbA1c, POC (controlled diabetic range)       COVID 19 screen:  No recent travel or known exposure to COVID19 The patient denies respiratory symptoms of COVID 19 at this time. The importance of social distancing was discussed today.   Assessment and Plan Type 2 diabetes mellitus with neurological complications [facility]) Assessment & Plan: Chronic, significant improvement with addition of semaglutide at 0.5 mg weekly.  A1c has dropped from 12.4 to 7.6. He is now only taking metformin XR 500 mg.  He has made significant changes with his eating habits and has cut back significantly on soda.  Of note he does have some side effects to semaglutide slower transit time of stool through colon.  He is currently being treated by GI for constipation.  We will hold off on titrating up the semaglutide to avoid possible further constipation side effect unless he plateaus out with improvement in sugar control and weight loss.  Orders: -     POCT glycosylated hemoglobin (Hb A1C)  Hypertension associated with diabetes ([place]) Assessment & Plan: Stable, chronic.  Continue current medication.   HCTZ 25 mg p.o. daily as needed  swelling   Paraparesis of both lower limbs (HCC) Assessment & Plan: Chronic, discussed how he would be able to be more active if he had bracing of left lower leg to help keep his foot straight.  His left foot tends to invert at the ankle due to weakness of lateral calf muscles.  Order for evaluation for orthotics faxed to [facility].   Hx of Cauda equina syndrome with neurogenic bladder [facility]) Assessment & Plan: Chronic, he has resulting paresis bilaterally   Other orders -     metFORMIN HCl ER; Take 1 tablet (500 mg total) by mouth 2 (two) times daily with a meal.       [name], MD

## 2022-11-17 NOTE — Assessment & Plan Note (Signed)
Chronic, he has resulting paresis bilaterally

## 2022-11-17 NOTE — Patient Instructions (Signed)
Keep up the great work! Continue current regimen.  I will look into orthotic for left lower leg.

## 2022-11-17 NOTE — Assessment & Plan Note (Signed)
Stable, chronic.  Continue current medication.   HCTZ 25 mg p.o. daily as needed swelling 

## 2022-11-17 NOTE — Assessment & Plan Note (Addendum)
Chronic, discussed how he would be able to be more active if he had bracing of left lower leg to help keep his foot straight.  His left foot tends to invert at the ankle due to weakness of lateral calf muscles.  Order for evaluation for orthotics faxed to [facility].

## 2022-11-17 NOTE — Assessment & Plan Note (Signed)
Chronic, significant improvement with addition of semaglutide at 0.5 mg weekly.  A1c has dropped from 12.4 to 7.6. He is now only taking metformin XR 500 mg.  He has made significant changes with his eating habits and has cut back significantly on soda.  Of note he does have some side effects to semaglutide slower transit time of stool through colon.  He is currently being treated by GI for constipation.  We will hold off on titrating up the semaglutide to avoid possible further constipation side effect unless he plateaus out with improvement in sugar control and weight loss.

## 2022-11-19 DIAGNOSIS — G834 Cauda equina syndrome: Secondary | ICD-10-CM | POA: Diagnosis not present

## 2022-11-19 DIAGNOSIS — Z7689 Persons encountering health services in other specified circumstances: Secondary | ICD-10-CM | POA: Diagnosis not present

## 2022-11-19 DIAGNOSIS — Z8673 Personal history of transient ischemic attack (TIA), and cerebral infarction without residual deficits: Secondary | ICD-10-CM | POA: Diagnosis not present

## 2022-11-20 DIAGNOSIS — H16233 Neurotrophic keratoconjunctivitis, bilateral: Secondary | ICD-10-CM | POA: Diagnosis not present

## 2022-11-20 DIAGNOSIS — H16223 Keratoconjunctivitis sicca, not specified as Sjogren's, bilateral: Secondary | ICD-10-CM | POA: Diagnosis not present

## 2022-11-20 DIAGNOSIS — H18891 Other specified disorders of cornea, right eye: Secondary | ICD-10-CM | POA: Diagnosis not present

## 2022-11-23 DIAGNOSIS — H18891 Other specified disorders of cornea, right eye: Secondary | ICD-10-CM | POA: Diagnosis not present

## 2022-11-23 DIAGNOSIS — H16223 Keratoconjunctivitis sicca, not specified as Sjogren's, bilateral: Secondary | ICD-10-CM | POA: Diagnosis not present

## 2022-11-23 DIAGNOSIS — H16233 Neurotrophic keratoconjunctivitis, bilateral: Secondary | ICD-10-CM | POA: Diagnosis not present

## 2022-11-27 DIAGNOSIS — H16223 Keratoconjunctivitis sicca, not specified as Sjogren's, bilateral: Secondary | ICD-10-CM | POA: Diagnosis not present

## 2022-11-27 DIAGNOSIS — H16233 Neurotrophic keratoconjunctivitis, bilateral: Secondary | ICD-10-CM | POA: Diagnosis not present

## 2022-12-07 ENCOUNTER — Other Ambulatory Visit: Payer: Self-pay | Admitting: Podiatry

## 2022-12-11 ENCOUNTER — Other Ambulatory Visit: Payer: Self-pay | Admitting: Family Medicine

## 2022-12-11 NOTE — Telephone Encounter (Signed)
Last office visit 11/17/2022 for DM.  Last refilled 08/24/2022 for #60 with 3 refills.  Next Appt: 02/20/23 for DM.

## 2022-12-14 ENCOUNTER — Encounter: Payer: Self-pay | Admitting: Podiatry

## 2022-12-14 ENCOUNTER — Ambulatory Visit (INDEPENDENT_AMBULATORY_CARE_PROVIDER_SITE_OTHER): Payer: Medicare PPO | Admitting: Podiatry

## 2022-12-14 DIAGNOSIS — B351 Tinea unguium: Secondary | ICD-10-CM | POA: Diagnosis not present

## 2022-12-14 DIAGNOSIS — E1149 Type 2 diabetes mellitus with other diabetic neurological complication: Secondary | ICD-10-CM | POA: Diagnosis not present

## 2022-12-14 DIAGNOSIS — M79675 Pain in left toe(s): Secondary | ICD-10-CM

## 2022-12-14 DIAGNOSIS — M79674 Pain in right toe(s): Secondary | ICD-10-CM

## 2022-12-14 NOTE — Progress Notes (Signed)
This patient returns to my office for at risk foot care.  This patient requires this care by a professional since this patient will be at risk due to having diabetes.  This patient is unable to cut nails himself since the patient cannot reach his nails.These nails are painful walking and wearing shoes.  This patient presents for at risk foot care today.  General Appearance  Alert, conversant and in no acute stress.  Vascular  Dorsalis pedis and posterior tibial  pulses are palpable  bilaterally.  Capillary return is within normal limits  bilaterally. Temperature is within normal limits  bilaterally.  Neurologic  Senn-Weinstein monofilament wire test diminished  bilaterally. Muscle power within normal limits bilaterally.  Nails Thick disfigured discolored nails with subungual debris  hallux nails bilaterally. No evidence of bacterial infection or drainage bilaterally.  Orthopedic  No limitations of motion  feet .  No crepitus or effusions noted.  No bony pathology or digital deformities noted.  Skin  normotropic skin with no porokeratosis noted bilaterally.  No signs of infections or ulcers noted.     Onychomycosis  Pain in right toes  Pain in left toes  Consent was obtained for treatment procedures.   Mechanical debridement of nails 1-5  bilaterally performed with a nail nipper.  Filed with dremel without incident.    Return office visit    3 months                  Told patient to return for periodic foot care and evaluation due to potential at risk complications.   Gardiner Barefoot DPM

## 2022-12-20 DIAGNOSIS — Z7689 Persons encountering health services in other specified circumstances: Secondary | ICD-10-CM | POA: Diagnosis not present

## 2022-12-20 DIAGNOSIS — Z8673 Personal history of transient ischemic attack (TIA), and cerebral infarction without residual deficits: Secondary | ICD-10-CM | POA: Diagnosis not present

## 2022-12-20 DIAGNOSIS — G834 Cauda equina syndrome: Secondary | ICD-10-CM | POA: Diagnosis not present

## 2023-01-08 ENCOUNTER — Telehealth: Payer: Self-pay

## 2023-01-08 NOTE — Progress Notes (Signed)
Care Management & Coordination Services Pharmacy Team  Reason for Encounter: Appointment Reminder  Contacted patient to confirm telephone appointment with Al Corpus , PharmD on 01/11/23 at 10:00. Unsuccessful outreach. Left voicemail for patient to return call.  Have you seen any other providers since your last visit with PCP? [facility] visits:  None in previous 6 months   Star Rating Drugs:  Medication:  Last Fill: Day Supply Metformin xr  500mg  12/06/22 90 Rosuvastatin 20mg  11/02/22 90 Ozempic  12/01/22 28   Care Gaps: Annual wellness visit in last year? Yes  If Diabetic: Last eye exam / retinopathy screening: UTD Last diabetic foot exam:UTD  Al Corpus, PharmD notified  Burt Knack, [facility] Clinical Pharmacy Assistant [phone]

## 2023-01-09 ENCOUNTER — Ambulatory Visit (INDEPENDENT_AMBULATORY_CARE_PROVIDER_SITE_OTHER): Payer: Medicare PPO | Admitting: *Deleted

## 2023-01-09 ENCOUNTER — Other Ambulatory Visit: Payer: Self-pay | Admitting: Family Medicine

## 2023-01-09 DIAGNOSIS — E1159 Type 2 diabetes mellitus with other circulatory complications: Secondary | ICD-10-CM | POA: Diagnosis not present

## 2023-01-09 DIAGNOSIS — Z7984 Long term (current) use of oral hypoglycemic drugs: Secondary | ICD-10-CM | POA: Diagnosis not present

## 2023-01-09 DIAGNOSIS — I152 Hypertension secondary to endocrine disorders: Secondary | ICD-10-CM

## 2023-01-09 DIAGNOSIS — E1149 Type 2 diabetes mellitus with other diabetic neurological complication: Secondary | ICD-10-CM

## 2023-01-09 DIAGNOSIS — I10 Essential (primary) hypertension: Secondary | ICD-10-CM

## 2023-01-09 NOTE — Telephone Encounter (Signed)
Last office visit 11/17/22 for DM.  Last refilled 12/12/22 for #60 with no refills.  Next Appt: 02/20/23 for DM.

## 2023-01-09 NOTE — Chronic Care Management (AMB) (Signed)
Chronic Care Management   CCM RN Visit Note  01/09/2023 Name: Jamie Finley MRN: [id number] DOB: 1961/04/15  Subjective: Jamie Finley is a 62 y.o. year old male who is a primary care patient of Finley, Jamie E, MD. The patient was referred to the Chronic Care Management team for assistance with care management needs subsequent to provider initiation of CCM services and plan of care.    Today's Visit:  Engaged with patient by telephone for follow up visit.        Goals Addressed             This Visit's Progress    CCM (DIABETES) EXPECTED OUTCOME:  MONITOR, SELF-MANAGE AND REDUCE SYMPTOMS OF DIABETES       Current Barriers:  Knowledge Deficits related to Diabetes management Chronic Disease Management support and education needs related to Diabetes, diet No Advanced Directives in place- pt declines information Patient reports he lives alone, has pastor from his church he can call on if needed, states overall independent with ADL, IADL's, continues to drive, has difficulty sometimes standing up too long for cooking and washing dishes, has WC, ramp, cane walker and always uses DME when ambulating, states continues to get out and be active. Patient reports he has been checking CBG fasting once daily with readings 122-140, reports "ozempic has helped more than anything" pt states he does not always follow a special diet, was drinking sugary drinks, recently switched to diet drinks. Pt reports he has lost 16 pounds while taking ozempic and would like to lose more, pt states he is not sure if he has lost more weight. Patient pleased with decrease in AIC down to 7.6 on 11/17/22 PHQ9=5, pt states he saw counselor this year but no longer goes, social work/ counseling services offered, pt declined. Patient reports 3 falls this past year with no injury  Planned Interventions: Reviewed medications with patient and discussed importance of medication adherence;        Reviewed prescribed diet  with patient carbohydrate modified; Counseled on importance of regular laboratory monitoring as prescribed;        Advised patient, providing education and rationale, to check cbg per MD order  and record        call provider for findings outside established parameters;       Review of patient status, including review of consultants reports, relevant laboratory and other test results, and medications completed;       Advised patient to discuss any issues with blood sugar, medications/ side effects with provider;      Safety precautions reviewed Reinforced importance of exercise and continuing at [facility] / swimming  Symptom Management: Take medications as prescribed   Attend all scheduled provider appointments Call pharmacy for medication refills 3-7 days in advance of running out of medications Attend church or other social activities Perform all self care activities independently  Perform IADL's (shopping, preparing meals, housekeeping, managing finances) independently Call provider office for new concerns or questions  check blood sugar at prescribed times: per doctor's order  check feet daily for cuts, sores or redness enter blood sugar readings and medication or insulin into daily log take the blood sugar log to all doctor visits take the blood sugar meter to all doctor visits set goal weight trim toenails straight across drink 6 to 8 glasses of water each day fill half of plate with vegetables limit fast food meals to no more than 1 per week manage portion size prepare main meal at home  3 to 5 days each week read food labels for fat, fiber, carbohydrates and portion size keep feet up while sitting wash and dry feet carefully every day wear comfortable, cotton socks Continue to work towards your goal of weight loss, consider getting a digital scale Congratulations on AIC of 7.6- keep up the good work! fall prevention strategies: change position slowly, use assistive device such as  walker or cane (per provider recommendations) when walking, keep walkways clear, have good lighting in room. It is important to contact your provider if you have any falls, maintain muscle strength/tone by exercise per provider recommendations.  Follow Up Plan: Telephone follow up appointment with care management team member scheduled for:  04/03/23 at 215 pm       CCM (HYPERTENSION) EXPECTED OUTCOME: MONITOR, SELF-MANAGE AND REDUCE SYMPTOMS OF HYPERTENSION       Current Barriers:  Knowledge Deficits related to Hypertension management Chronic Disease Management support and education needs related to Hypertension, diet No Advanced Directives in place- pt declines Patient reports he has a wrist cuff but does not monitor blood pressure at home, states " it's always usually good" Patient reports he swims at [facility] at least once per week Patient states he did not hear from the referral for [employer] for orthotic evaluation for left foot (states his foot will not stay straight)  Planned Interventions: Evaluation of current treatment plan related to hypertension self management and patient's adherence to plan as established by provider;   Reviewed medications with patient and discussed importance of compliance;  Counseled on the importance of exercise goals with target of 150 minutes per week Advised patient, providing education and rationale, to monitor blood pressure daily and record, calling PCP for findings outside established parameters;  Advised patient to discuss any issues with blood pressure, medications with provider; Discussed complications of poorly controlled blood pressure such as heart disease, stroke, circulatory complications, vision complications, kidney impairment, sexual dysfunction;  Reviewed importance of adherence to low sodium diet In basket message sent to primary care provider reporting pt did not hear anything from [employer] referral  Symptom Management: Take medications as  prescribed   Attend all scheduled provider appointments Call pharmacy for medication refills 3-7 days in advance of running out of medications Attend church or other social activities Perform all self care activities independently  Perform IADL's (shopping, preparing meals, housekeeping, managing finances) independently Call provider office for new concerns or questions  check blood pressure weekly choose a place to take my blood pressure (home, clinic or office, retail store) write blood pressure results in a log or diary learn about high blood pressure keep a blood pressure log take blood pressure log to all doctor appointments keep all doctor appointments take medications for blood pressure exactly as prescribed eat more whole grains, fruits and vegetables, lean meats and healthy fats Follow low sodium diet- limit fast food Read labels for sodium content Continue swimming at [facility], any exercise is helpful  Follow Up Plan: Telephone follow up appointment with care management team member scheduled for:   04/03/23 at 215 pm          Plan:Telephone follow up appointment with care management team member scheduled for:  04/03/23 at 215 pm  Irving Shows [facility], BSN RN Case Manager [facility] [phone]

## 2023-01-09 NOTE — Patient Instructions (Signed)
Please call the care guide team at [phone] if you need to cancel or reschedule your appointment.   If you are experiencing a Mental Health or Behavioral Health Crisis or need someone to talk to, please call the Suicide and Crisis Lifeline: 988 call the [place] National Suicide Prevention Lifeline: (959) 550-9858 or TTY: 682-416-0966 TTY (713)594-9868) to talk to a trained counselor call 1-800-273-TALK (toll free, 24 hour hotline) go to [facility] [address] [place] [phone]) call 911   Following is a copy of the CCM Program Consent:  CCM service includes personalized support from designated clinical staff supervised by the physician, including individualized plan of care and coordination with other care providers 24/7 contact phone numbers for assistance for urgent and routine care needs. Service will only be billed when office clinical staff spend 20 minutes or more in a month to coordinate care. Only one practitioner may furnish and bill the service in a calendar month. The patient may stop CCM services at amy time (effective at the end of the month) by phone call to the office staff. The patient will be responsible for cost sharing (co-pay) or up to 20% of the service fee (after annual deductible is met)  Following is a copy of your full provider care plan:   Goals Addressed             This Visit's Progress    CCM (DIABETES) EXPECTED OUTCOME:  MONITOR, SELF-MANAGE AND REDUCE SYMPTOMS OF DIABETES       Current Barriers:  Knowledge Deficits related to Diabetes management Chronic Disease Management support and education needs related to Diabetes, diet No Advanced Directives in place- pt declines information Patient reports he lives alone, has pastor from his church he can call on if needed, states overall independent with ADL, IADL's, continues to drive, has difficulty sometimes standing up too long for cooking and washing dishes, has  WC, ramp, cane walker and always uses DME when ambulating, states continues to get out and be active. Patient reports he has been checking CBG fasting once daily with readings 122-140, reports "ozempic has helped more than anything" pt states he does not always follow a special diet, was drinking sugary drinks, recently switched to diet drinks. Pt reports he has lost 16 pounds while taking ozempic and would like to lose more, pt states he is not sure if he has lost more weight. Patient pleased with decrease in AIC down to 7.6 on 11/17/22 PHQ9=5, pt states he saw counselor this year but no longer goes, social work/ counseling services offered, pt declined. Patient reports 3 falls this past year with no injury  Planned Interventions: Reviewed medications with patient and discussed importance of medication adherence;        Reviewed prescribed diet with patient carbohydrate modified; Counseled on importance of regular laboratory monitoring as prescribed;        Advised patient, providing education and rationale, to check cbg per MD order  and record        call provider for findings outside established parameters;       Review of patient status, including review of consultants reports, relevant laboratory and other test results, and medications completed;       Advised patient to discuss any issues with blood sugar, medications/ side effects with provider;      Safety precautions reviewed Reinforced importance of exercise and continuing at [facility] / swimming  Symptom Management: Take medications as prescribed   Attend all scheduled  provider appointments Call pharmacy for medication refills 3-7 days in advance of running out of medications Attend church or other social activities Perform all self care activities independently  Perform IADL's (shopping, preparing meals, housekeeping, managing finances) independently Call provider office for new concerns or questions  check blood sugar at prescribed  times: per doctor's order  check feet daily for cuts, sores or redness enter blood sugar readings and medication or insulin into daily log take the blood sugar log to all doctor visits take the blood sugar meter to all doctor visits set goal weight trim toenails straight across drink 6 to 8 glasses of water each day fill half of plate with vegetables limit fast food meals to no more than 1 per week manage portion size prepare main meal at home 3 to 5 days each week read food labels for fat, fiber, carbohydrates and portion size keep feet up while sitting wash and dry feet carefully every day wear comfortable, cotton socks Continue to work towards your goal of weight loss, consider getting a digital scale Congratulations on AIC of 7.6- keep up the good work! fall prevention strategies: change position slowly, use assistive device such as walker or cane (per provider recommendations) when walking, keep walkways clear, have good lighting in room. It is important to contact your provider if you have any falls, maintain muscle strength/tone by exercise per provider recommendations.  Follow Up Plan: Telephone follow up appointment with care management team member scheduled for:  04/03/23 at 215 pm       CCM (HYPERTENSION) EXPECTED OUTCOME: MONITOR, SELF-MANAGE AND REDUCE SYMPTOMS OF HYPERTENSION       Current Barriers:  Knowledge Deficits related to Hypertension management Chronic Disease Management support and education needs related to Hypertension, diet No Advanced Directives in place- pt declines Patient reports he has a wrist cuff but does not monitor blood pressure at home, states " it's always usually good" Patient reports he swims at [facility] at least once per week Patient states he did not hear from the referral for [employer] for orthotic evaluation for left foot (states his foot will not stay straight)  Planned Interventions: Evaluation of current treatment plan related to hypertension  self management and patient's adherence to plan as established by provider;   Reviewed medications with patient and discussed importance of compliance;  Counseled on the importance of exercise goals with target of 150 minutes per week Advised patient, providing education and rationale, to monitor blood pressure daily and record, calling PCP for findings outside established parameters;  Advised patient to discuss any issues with blood pressure, medications with provider; Discussed complications of poorly controlled blood pressure such as heart disease, stroke, circulatory complications, vision complications, kidney impairment, sexual dysfunction;  Reviewed importance of adherence to low sodium diet In basket message sent to primary care provider reporting pt did not hear anything from [employer] referral  Symptom Management: Take medications as prescribed   Attend all scheduled provider appointments Call pharmacy for medication refills 3-7 days in advance of running out of medications Attend church or other social activities Perform all self care activities independently  Perform IADL's (shopping, preparing meals, housekeeping, managing finances) independently Call provider office for new concerns or questions  check blood pressure weekly choose a place to take my blood pressure (home, clinic or office, retail store) write blood pressure results in a log or diary learn about high blood pressure keep a blood pressure log take blood pressure log to all doctor appointments keep all doctor  appointments take medications for blood pressure exactly as prescribed eat more whole grains, fruits and vegetables, lean meats and healthy fats Follow low sodium diet- limit fast food Read labels for sodium content Continue swimming at [facility], any exercise is helpful  Follow Up Plan: Telephone follow up appointment with care management team member scheduled for:   04/03/23 at 215 pm          Patient  verbalizes understanding of instructions and care plan provided today and agrees to view in MyChart. Active MyChart status and patient understanding of how to access instructions and care plan via MyChart confirmed with patient.  Telephone follow up appointment with care management team member scheduled for:  04/03/23 at 215 pm

## 2023-01-10 ENCOUNTER — Other Ambulatory Visit: Payer: Self-pay | Admitting: Family Medicine

## 2023-01-10 ENCOUNTER — Ambulatory Visit (INDEPENDENT_AMBULATORY_CARE_PROVIDER_SITE_OTHER): Payer: Medicare PPO | Admitting: *Deleted

## 2023-01-10 ENCOUNTER — Telehealth: Payer: Self-pay | Admitting: *Deleted

## 2023-01-10 ENCOUNTER — Encounter: Payer: Self-pay | Admitting: Family Medicine

## 2023-01-10 DIAGNOSIS — E1159 Type 2 diabetes mellitus with other circulatory complications: Secondary | ICD-10-CM

## 2023-01-10 DIAGNOSIS — E1149 Type 2 diabetes mellitus with other diabetic neurological complication: Secondary | ICD-10-CM

## 2023-01-10 NOTE — Telephone Encounter (Signed)
I call [employer].  Their office has moved and they have a different fax number so the order we faxed in March was never received.  Re-faxed order to [employer] at [phone]

## 2023-01-10 NOTE — Patient Instructions (Signed)
Please call the care guide team at [phone] if you need to cancel or reschedule your appointment.   If you are experiencing a Mental Health or Behavioral Health Crisis or need someone to talk to, please call the Suicide and Crisis Lifeline: 988 call the [place] National Suicide Prevention Lifeline: 205-676-4785 or TTY: (604)586-3255 TTY 979-315-4540) to talk to a trained counselor call 1-800-273-TALK (toll free, 24 hour hotline) go to [facility] [address] [place] [phone]) call 911   Following is a copy of the CCM Program Consent:  CCM service includes personalized support from designated clinical staff supervised by the physician, including individualized plan of care and coordination with other care providers 24/7 contact phone numbers for assistance for urgent and routine care needs. Service will only be billed when office clinical staff spend 20 minutes or more in a month to coordinate care. Only one practitioner may furnish and bill the service in a calendar month. The patient may stop CCM services at amy time (effective at the end of the month) by phone call to the office staff. The patient will be responsible for cost sharing (co-pay) or up to 20% of the service fee (after annual deductible is met)  Following is a copy of your full provider care plan:   Goals Addressed             This Visit's Progress    CCM (DIABETES) EXPECTED OUTCOME:  MONITOR, SELF-MANAGE AND REDUCE SYMPTOMS OF DIABETES       Current Barriers:  Knowledge Deficits related to Diabetes management Chronic Disease Management support and education needs related to Diabetes, diet No Advanced Directives in place- pt declines information Patient reports he lives alone, has pastor from his church he can call on if needed, states overall independent with ADL, IADL's, continues to drive, has difficulty sometimes standing up too long for cooking and washing dishes, has  WC, ramp, cane walker and always uses DME when ambulating, states continues to get out and be active. Patient reports he has been checking CBG fasting once daily with readings 122-140, reports "ozempic has helped more than anything" pt states he does not always follow a special diet, was drinking sugary drinks, recently switched to diet drinks. Pt reports he has lost 16 pounds while taking ozempic and would like to lose more, pt states he is not sure if he has lost more weight. Patient pleased with decrease in AIC down to 7.6 on 11/17/22 PHQ9=5, pt states he saw counselor this year but no longer goes, social work/ counseling services offered, pt declined. Patient reports 3 falls this past year with no injury 01/10/23- message received from primary care provider referral was faxed to [employer] on 11/17/22, RN care manager to follow up   Planned Interventions: Reviewed medications with patient and discussed importance of medication adherence;        Reviewed prescribed diet with patient carbohydrate modified; Counseled on importance of regular laboratory monitoring as prescribed;        Advised patient, providing education and rationale, to check cbg per MD order  and record        call provider for findings outside established parameters;       Review of patient status, including review of consultants reports, relevant laboratory and other test results, and medications completed;       Advised patient to discuss any issues with blood sugar, medications/ side effects with provider;      Safety precautions reviewed Reinforced  importance of exercise and continuing at [facility] / swimming Telephone call to [employer] spoke with Efraim Kaufmann who reports she does not see a referral and ask that it be re-sent to fax number [phone]- message sent to primary care provider requesting referral be re-sent  Symptom Management: Take medications as prescribed   Attend all scheduled provider  appointments Call pharmacy for medication refills 3-7 days in advance of running out of medications Attend church or other social activities Perform all self care activities independently  Perform IADL's (shopping, preparing meals, housekeeping, managing finances) independently Call provider office for new concerns or questions  check blood sugar at prescribed times: per doctor's order  check feet daily for cuts, sores or redness enter blood sugar readings and medication or insulin into daily log take the blood sugar log to all doctor visits take the blood sugar meter to all doctor visits set goal weight trim toenails straight across drink 6 to 8 glasses of water each day fill half of plate with vegetables limit fast food meals to no more than 1 per week manage portion size prepare main meal at home 3 to 5 days each week read food labels for fat, fiber, carbohydrates and portion size keep feet up while sitting wash and dry feet carefully every day wear comfortable, cotton socks Continue to work towards your goal of weight loss, consider getting a digital scale Congratulations on AIC of 7.6- keep up the good work! fall prevention strategies: change position slowly, use assistive device such as walker or cane (per provider recommendations) when walking, keep walkways clear, have good lighting in room. It is important to contact your provider if you have any falls, maintain muscle strength/tone by exercise per provider recommendations. Referral is being re-sent, expect a call from [facility] [employer] about orthotic for your left lower leg/ foot  Follow Up Plan: Telephone follow up appointment with care management team member scheduled for:  04/03/23 at 215 pm          Patient verbalizes understanding of instructions and care plan provided today and agrees to view in MyChart. Active MyChart status and patient understanding of how to access instructions and care plan via  MyChart confirmed with patient.  Telephone follow up appointment with care management team member scheduled for:  04/03/23 at 215 pm

## 2023-01-10 NOTE — Chronic Care Management (AMB) (Signed)
Chronic Care Management   CCM RN Visit Note  01/10/2023 Name: Jamie Finley MRN: [id number] DOB: [birth year]  Subjective: Jamie Finley is a 62 y.o. year old male who is a primary care patient of Bedsole, Amy E, MD. The patient was referred to the Chronic Care Management team for assistance with care management needs subsequent to provider initiation of CCM services and plan of care.    Today's Visit:  Collaboration with [employer] [employer]  for  checking on status of referral .        Goals Addressed             This Visit's Progress    CCM (DIABETES) EXPECTED OUTCOME:  MONITOR, SELF-MANAGE AND REDUCE SYMPTOMS OF DIABETES       Current Barriers:  Knowledge Deficits related to Diabetes management Chronic Disease Management support and education needs related to Diabetes, diet No Advanced Directives in place- pt declines information Patient reports he lives alone, has pastor from his church he can call on if needed, states overall independent with ADL, IADL's, continues to drive, has difficulty sometimes standing up too long for cooking and washing dishes, has WC, ramp, cane walker and always uses DME when ambulating, states continues to get out and be active. Patient reports he has been checking CBG fasting once daily with readings 122-140, reports "ozempic has helped more than anything" pt states he does not always follow a special diet, was drinking sugary drinks, recently switched to diet drinks. Pt reports he has lost 16 pounds while taking ozempic and would like to lose more, pt states he is not sure if he has lost more weight. Patient pleased with decrease in AIC down to 7.6 on 11/17/22 PHQ9=5, pt states he saw counselor this year but no longer goes, social work/ counseling services offered, pt declined. Patient reports 3 falls this past year with no injury 01/10/23- message received from primary care provider referral was faxed to [employer] on 11/17/22, RN care manager  to follow up   Planned Interventions: Reviewed medications with patient and discussed importance of medication adherence;        Reviewed prescribed diet with patient carbohydrate modified; Counseled on importance of regular laboratory monitoring as prescribed;        Advised patient, providing education and rationale, to check cbg per MD order  and record        call provider for findings outside established parameters;       Review of patient status, including review of consultants reports, relevant laboratory and other test results, and medications completed;       Advised patient to discuss any issues with blood sugar, medications/ side effects with provider;      Safety precautions reviewed Reinforced importance of exercise and continuing at [facility] / swimming Telephone call to [employer] spoke with Efraim Kaufmann who reports she does not see a referral and ask that it be re-sent to fax number [phone]- message sent to primary care provider requesting referral be re-sent  Symptom Management: Take medications as prescribed   Attend all scheduled provider appointments Call pharmacy for medication refills 3-7 days in advance of running out of medications Attend church or other social activities Perform all self care activities independently  Perform IADL's (shopping, preparing meals, housekeeping, managing finances) independently Call provider office for new concerns or questions  check blood sugar at prescribed times: per doctor's order  check feet daily for cuts, sores or redness enter blood sugar readings and  medication or insulin into daily log take the blood sugar log to all doctor visits take the blood sugar meter to all doctor visits set goal weight trim toenails straight across drink 6 to 8 glasses of water each day fill half of plate with vegetables limit fast food meals to no more than 1 per week manage portion size prepare main meal at home 3 to 5 days  each week read food labels for fat, fiber, carbohydrates and portion size keep feet up while sitting wash and dry feet carefully every day wear comfortable, cotton socks Continue to work towards your goal of weight loss, consider getting a digital scale Congratulations on AIC of 7.6- keep up the good work! fall prevention strategies: change position slowly, use assistive device such as walker or cane (per provider recommendations) when walking, keep walkways clear, have good lighting in room. It is important to contact your provider if you have any falls, maintain muscle strength/tone by exercise per provider recommendations. Referral is being re-sent, expect a call from [facility] ([facility] [employer] about orthotic for your left lower leg/ foot  Follow Up Plan: Telephone follow up appointment with care management team member scheduled for:  04/03/23 at 215 pm          Plan:Telephone follow up appointment with care management team member scheduled for:  04/03/23 at 215 pm  Irving Shows [facility], BSN RN Case Manager [facility] [place] [phone]

## 2023-01-10 NOTE — Telephone Encounter (Signed)
-----   Message from Excell Seltzer, MD sent at 01/10/2023  1:07 PM EDT ----- Regarding: RE: referral [employer] We faxed an order over the day he was in the appointment in March.  It was a paper order form.  Is this was to be done differently?  CC: Lupita Leash.. can you call [employer] and see  how referral should be placed? ----- Message ----- From: Audrie Gallus, RN Sent: 01/09/2023   2:00 PM EDT To: Excell Seltzer, MD Subject: referral [employer]                               I spoke with pt today and he has not been contacted by [employer], he said he thinks a referral was being sent, I looked in the referral tab but I don't see it, can you please check and see if it was done or needs to be re-sent.    Thank you

## 2023-01-10 NOTE — Telephone Encounter (Signed)
Melissa from [facility] called in requesting a call back stated they need clarification on orders , notes from office visit  on 11/17/22 . Please advise # [phone]

## 2023-01-10 NOTE — Telephone Encounter (Signed)
Spoke with Melissa.  They need Mr. Lacuesta office note from 11/17/2022.  Office note faxed to [phone].

## 2023-01-11 ENCOUNTER — Telehealth: Payer: Self-pay | Admitting: Pharmacist

## 2023-01-11 ENCOUNTER — Encounter: Payer: Medicare PPO | Admitting: Pharmacist

## 2023-01-11 NOTE — Telephone Encounter (Signed)
Please update pt on status of the referral and the reason for the delay.

## 2023-01-11 NOTE — Progress Notes (Unsigned)
Care Management & Coordination Services Pharmacy Note  01/11/2023 Name:  Christopherjohn Schiele MRN:  [id number] DOB:  March 23, 1961  Summary: F/U visit -DM: A1c 12.4% (08/2022); pt has started Ozempic and made dietary changes, he reports fasting BG is much improved and he has cut back on metformin from 4 to 2 tablets daily; fasting BG 107 today, he is not checking other times   Recommendations/Changes made from today's visit: -Advised to check BG 2-hrs after a meal as well -Advised it is reasonable to continue metformin 1000 mg/day and Ozempic 0.5 mg weekly until PCP f/u/repeat A1c  Follow up plan: -Health Concierge will call patient 2 months for DM update -Pharmacist follow up televisit scheduled for 3 months -PCP appt 11/17/22    Subjective: Rodolphe Edmonston is an 62 y.o. year old male who is a primary patient of Bedsole, Amy E, MD.  The care coordination team was consulted for assistance with disease management and care coordination needs.    Engaged with patient by telephone for follow up visit.  Recent office visits: 11/17/22 Dr Ermalene Searing OV: A1c 7.6%; he is taking metformin only 500 mg. Slower transit of stool with Ozempic - hold off on titrating.   10/03/22 Dr Ermalene Searing OV: cellultis of R lower leg - rx doxy. Update Ozempic to 0.5 mg.  08/29/22 Dr Ermalene Searing OV: abscess - rx doxycycline.  08/18/22 Dr Ermalene Searing OV: A1c 12.5%; restart glipizide 10 mg. Pursue GLP-1 RA   05/19/22 Dr Ermalene Searing OV: A1c 9.2% off of glipizide/Actos. F/u 3 months.  Recent consult visits: 10/12/21 GI pt message - Amitiza failed, Trulance now approved. 10/03/22 Dr Tobi Bastos VV (GI): f/u - continue Amitiza. F/u with PCP for TSH elevation.  09/29/22 Podiatry - L heel ulceration. Discussed offloading pressure with footwear.  09/12/22 LCSW - Meals on Wheels referral. 09/12/22 Podiatry - DM foot eval.  08/28/22 Dr Tobi Bastos (GI): chronic constipation - tried Miralax, Benefiber. Increase dietary fiber to 35 g. Trial Trulance samples. TSH 6.6,  need T3/T4.  04/17/22 Nutrition - diabetes counseling.   Hospital visits: None in previous 6 months   Objective:  Lab Results  Component Value Date   CREATININE 1.21 05/11/2022   BUN 13 05/11/2022   GFR 64.62 05/11/2022   GFRNONAA >60 02/11/2022   GFRAA >60 02/11/2019   NA 134 (L) 05/11/2022   K 3.5 05/11/2022   CALCIUM 9.4 05/11/2022   CO2 31 05/11/2022   GLUCOSE 248 (H) 05/11/2022    Lab Results  Component Value Date/Time   HGBA1C 7.6 (A) 11/17/2022 11:51 AM   HGBA1C 12.4 (A) 08/18/2022 10:42 AM   HGBA1C 9.2 (H) 05/11/2022 09:26 AM   HGBA1C 6.7 (H) 11/09/2021 10:06 AM   GFR 64.62 05/11/2022 09:26 AM   GFR 76.30 04/26/2021 09:53 AM   MICROALBUR 2.7 (H) 05/11/2022 04:57 PM   MICROALBUR <0.7 02/21/2019 09:45 AM    Last diabetic Eye exam:  Lab Results  Component Value Date/Time   HMDIABEYEEXA No Retinopathy 02/22/2022 12:00 AM    Last diabetic Foot exam:  Lab Results  Component Value Date/Time   HMDIABFOOTEX done 07/18/2021 12:00 AM     Lab Results  Component Value Date   CHOL 149 05/11/2022   HDL 40.30 05/11/2022   LDLCALC 62 03/02/2020   LDLDIRECT 81.0 05/11/2022   TRIG 236.0 (H) 05/11/2022   CHOLHDL 4 05/11/2022       Latest Ref Rng & Units 05/11/2022    9:26 AM 07/13/2021    4:09 AM 04/26/2021    9:53 AM  Hepatic Function  Total Protein 6.0 - 8.3 g/dL 7.5  8.0  7.5   Albumin 3.5 - 5.2 g/dL 4.0  4.2  4.2   AST 0 - 37 U/L 15  20  17    ALT 0 - 53 U/L 14  15  14    Alk Phosphatase 39 - 117 U/L 77  62  54   Total Bilirubin 0.2 - 1.2 mg/dL 0.8  1.1  0.8     Lab Results  Component Value Date/Time   TSH 6.680 (H) 08/28/2022 02:47 PM   TSH 4.22 07/17/2017 11:34 AM       Latest Ref Rng & Units 02/11/2022    7:53 PM 07/13/2021    4:09 AM 06/04/2021    6:35 AM  CBC  WBC 4.0 - 10.5 K/uL 8.5  10.1  8.0   Hemoglobin 13.0 - 17.0 g/dL 16.1  09.6  04.5   Hematocrit 39.0 - 52.0 % 41.8  38.4  39.1   Platelets 150 - 400 K/uL 258  277  245     Lab Results   Component Value Date/Time   VD25OH 63 07/17/2017 11:34 AM   VD25OH 62 06/13/2017 03:12 PM   VITAMINB12 351 06/13/2017 03:12 PM    Clinical ASCVD: No  The ASCVD Risk score (Arnett DK, et al., 2019) failed to calculate for the following reasons:   The patient has a prior MI or stroke diagnosis        10/03/2022   10:47 AM 09/12/2022    3:15 PM 09/08/2022    2:12 PM  Depression screen PHQ 2/9  Decreased Interest 1 0 1  Down, Depressed, Hopeless 1 0 1  PHQ - 2 Score 2 0 2  Altered sleeping 0  2  Tired, decreased energy 1  1  Change in appetite 0  0  Feeling bad or failure about yourself  0  0  Trouble concentrating 1  0  Moving slowly or fidgety/restless 0  0  Suicidal thoughts 0  0  PHQ-9 Score 4  5  Difficult doing work/chores Somewhat difficult  Somewhat difficult     Social History   Tobacco Use  Smoking Status Former   Packs/day: 1.00   Years: 5.00   Additional pack years: 0.00   Total pack years: 5.00   Types: Cigarettes   Quit date: 09/11/1989   Years since quitting: 33.3  Smokeless Tobacco Never   BP Readings from Last 3 Encounters:  11/17/22 100/74  10/03/22 118/66  09/29/22 132/74   Pulse Readings from Last 3 Encounters:  11/17/22 72  10/03/22 76  09/12/22 80   Wt Readings from Last 3 Encounters:  11/17/22 (!) 306 lb 4 oz (138.9 kg)  10/03/22 (!) 310 lb (140.6 kg)  08/29/22 (!) 326 lb (147.9 kg)   BMI Readings from Last 3 Encounters:  11/17/22 39.32 kg/m  10/03/22 39.80 kg/m  08/29/22 41.86 kg/m    No Known Allergies  Medications Reviewed Today     Reviewed by Audrie Gallus, RN (Registered Nurse) on 01/09/23 at 1334  Med List Status: <None>   Medication Order Taking? Sig Documenting Provider Last Dose Status Informant  Accu-Chek Softclix Lancets lancets [id number] Yes Use to check fasting blood sugar daily Bedsole, Amy E, MD Taking Active   acetaminophen (TYLENOL) 325 MG tablet [id number] Yes Take 325 mg by mouth every 4 (four) hours as  needed.  [provider] Taking Active   ammonium lactate (AMLACTIN) 12 % lotion [id number] Yes Apply  1 Application topically as needed for dry skin. Candelaria Stagers, DPM Taking Active   aspirin 81 MG tablet [id number] Yes Take 81 mg by mouth daily. Ellyn Hack, MD Taking Active Self  blood glucose meter kit and supplies [id number] Yes Dispense based on patient and insurance preference. Use to check blood sugar two times a day. Excell Seltzer, MD Taking Active   Blood Glucose Monitoring Suppl (ACCU-CHEK GUIDE) w/Device KIT [id number] Yes Use to check blood fasting blood sugar daily Bedsole, Amy E, MD Taking Active   Cholecalciferol (VITAMIN D3) 50 MCG (2000 UT) TABS [id number] Yes Take 5-7 tablets by mouth daily. [provider] Taking Active   colchicine 0.6 MG tablet [id number] Yes Take 1 tablet (0.6 mg total) by mouth daily as needed. Excell Seltzer, MD Taking Active   fluorometholone (FML) 0.1 % ophthalmic suspension [id number] Yes Place 1 drop into both eyes 4 (four) times daily. [provider] Taking Active   gabapentin (NEURONTIN) 400 MG capsule [id number] Yes TAKE 1 CAPSULE BY MOUTH TWICE DAILY AS NEEDED Bedsole, Amy E, MD Taking Active   glucose blood (ACCU-CHEK GUIDE) test strip [id number] Yes Use to check blood fasting blood sugar daily Bedsole, Amy E, MD Taking Active   hydrochlorothiazide (HYDRODIURIL) 25 MG tablet [id number] Yes Take 1 tablet (25 mg total) by mouth daily as needed. Excell Seltzer, MD Taking Active   metFORMIN (GLUCOPHAGE-XR) 500 MG 24 hr tablet [id number] Yes Take 1 tablet (500 mg total) by mouth 2 (two) times daily with a meal. Bedsole, Amy E, MD Taking Active   methocarbamol (ROBAXIN) 750 MG tablet [id number] Yes Take 1 tablet (750 mg total) by mouth daily as needed for muscle spasms. Excell Seltzer, MD Taking Active   Multiple Vitamins-Minerals (ZINC PO) [id number] Yes Take 1 tablet by mouth daily. [provider] Taking Active    Plecanatide (TRULANCE) 3 MG TABS [id number] Yes Take 1 tablet (3 mg total) by mouth daily. Wyline Mood, MD Taking Active   polyethylene glycol (MIRALAX / GLYCOLAX) 17 g packet [id number] Yes Take 17 g by mouth daily. [provider] Taking Active   rosuvastatin (CRESTOR) 20 MG tablet [id number] Yes TAKE 1 TABLET BY MOUTH AT BEDTIME Bedsole, Amy E, MD Taking Active   Semaglutide,0.25 or 0.5MG /DOS, (OZEMPIC, 0.25 OR 0.5 MG/DOSE,) 2 MG/3ML SOPN [id number] Yes Inject 0.5 mg as directed once a week. Excell Seltzer, MD Taking Active   sildenafil (REVATIO) 20 MG tablet [id number] Yes Take 5 tablets prior to sexual activity Ermalene Searing, Luberta Robertson, MD Taking Active             SDOH:  (Social Determinants of Health) assessments and interventions performed: No SDOH Interventions    Flowsheet Row Care Coordination from 09/12/2022 in [facility] Care Coordination Chronic Care Management from 09/08/2022 in [facility] Clinical Support from 08/29/2022 in [facility] Chronic Care Management from 08/24/2022 in [facility] Office Visit from 02/16/2022 in [facility] [place]  SDOH Interventions       Food Insecurity Interventions Other (Comment)  Halina Maidens to meals on wheels referral] Intervention Not Indicated Intervention Not Indicated AMB Referral  [coordinate referral for SDOH] --  Housing Interventions Intervention Not Indicated Intervention Not Indicated Intervention Not Indicated Intervention Not Indicated --  Transportation Interventions -- Intervention Not Indicated Intervention Not Indicated Intervention Not Indicated --  Utilities Interventions --  Intervention Not Indicated Intervention Not Indicated -- --  Alcohol Usage Interventions -- -- Intervention Not Indicated (Score <7) -- --  Depression Interventions/Treatment  -- --  [pt went to counseling this  year, has now stopped now, pt refuses social work] -- -- Counseling  Financial Strain Interventions -- Intervention Not Indicated Intervention Not Indicated Intervention Not Indicated  [patient has Medicare/Medicaid] --  Physical Activity Interventions -- Patient Refused Patient Refused Other (Comments)  [patient is wheelchair bound] --  Stress Interventions -- Intervention Not Indicated Intervention Not Indicated -- --  Social Connections Interventions -- Intervention Not Indicated Intervention Not Indicated -- --       Medication Assistance: None required.  Patient affirms current coverage meets needs. - Medicare/Medicaid  Medication Access: Within the past 30 days, how often has patient missed a dose of medication? 0 Is a pillbox or other method used to improve adherence? Yes  Factors that may affect medication adherence? no barriers identified Are meds synced by current pharmacy? No  Are meds delivered by current pharmacy? No  Does patient experience delays in picking up medications due to transportation concerns? No   [facility] Services Reviewed: Is patient disadvantaged to use [facility]?: No  Current Rx insurance plan: [employer]/Medicaid Name and location of Current pharmacy:  [facility] [address] ([address]), [place] - [address] [place] ([place]) [place] [address] Phone: [phone] Fax: [phone]  [facility] services reviewed with patient today?: No  Patient requests to transfer care to [facility]?: No  Reason patient declined to change pharmacies: Not mentioned at this visit  Compliance/Adherence/Medication fill history: Care Gaps: Foot exam (due 07/2022, podiatry referral 08/18/22)  Star-Rating Drugs: Metformin - PDC Rosuvastatin - PDC Ozempic - PDC   Assessment/Plan   Hypertension (BP goal <130/80) -Controlled - BP low end of normal sometimes -Current treatment: HCTZ 25 mg daily PRN - Appropriate,  Effective, Safe, Accessible -Medications previously tried: n/a  -Denies hypotensive/hypertensive symptoms -Counseled to monitor BP at home periodically -Recommended to continue current medication  Hyperlipidemia: (LDL goal < 70) -Not ideally controlled - LDL 81 (04/2022) above goal -Hx CVA, previously on clopiodgrel -Current treatment: Rosuvastatin 20 mg daily -Appropriate, Query Effective Aspirin 81 mg daily - Appropriate, Effective, Safe, Accessible -Medications previously tried: n/a -Educated on Cholesterol goals; Benefits of statin for ASCVD risk reduction; Importance of limiting foods high in cholesterol; -Recommended to continue current medication; consider ezetimibe in future  Diabetes (A1c goal <7%) -{US controlled/uncontrolled:25276} - A1c 7.6% (11/2022) improved from 12.4% (08/2022); pt has started Ozempic and changed diet significantly since then; he reports eating much less than he used to, he has lost 16 lbs; he did reduce metformin dose from 4 to 2 tablets daily due to lower glucose readings -DM Dx ~2016 -Current home glucose readings: fasting 107 -Denies hypoglycemic/hyperglycemic symptoms -Current medications: Metformin ER 500 mg - 1 tab BID - Appropriate, Query Effective Ozempic 0.5 mg weekly - Appropriate, Query effective Testing supplies -Medications previously tried: Actos, glipizide   -Current meal patterns: in wheelchair he cannot reach stove to cook, so he eats out often (fast food); drinks: water, unsweet tea; he has cut back significantly on Cheerwine/sodas -Current exercise: none (wheelchair bound) -Educated on A1c and blood sugar goals; Complications of diabetes including kidney damage, retinal damage, and cardiovascular disease; Benefits of weight loss;  -Discussed internal processes that regulate blood sugar (ie, why sugar can be elevated without eating anything); discussed importance of dietary changes to improve A1c -Reviewed GLP-1  RA; discussed nausea is  most common side effect, discussed diet changes can mitigate nausea -Recommend to continue current medication; repeat A1c at next OV  Health Maintenance -Vaccine gaps: TDAP, Shingrix -Hx DVT, 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

## 2023-01-11 NOTE — Telephone Encounter (Signed)
Care Management & Coordination Services Outreach Note  01/11/2023 Name: Jamie Finley MRN: [id number] DOB: [birth year]  Referred by: Excell Seltzer, MD  Patient had a phone appointment scheduled with clinical pharmacist today.  An unsuccessful telephone outreach was attempted today. The patient was referred to the pharmacist for assistance with medications, care management and care coordination.   Patient will NOT be penalized in any way for missing a Care Management & Coordination Services appointment. The no-show fee does not apply.  If possible, a message was left to return call to: [phone] or to [facility].  Al Corpus, PharmD, BCACP Clinical Pharmacist  Primary Care at [facility] [phone]

## 2023-01-11 NOTE — Telephone Encounter (Signed)
Called patient reviewed all information and repeated back to me. Will call if any questions.  ? ?

## 2023-01-16 DIAGNOSIS — H16233 Neurotrophic keratoconjunctivitis, bilateral: Secondary | ICD-10-CM | POA: Diagnosis not present

## 2023-01-30 ENCOUNTER — Telehealth: Payer: Self-pay | Admitting: *Deleted

## 2023-01-30 DIAGNOSIS — E1169 Type 2 diabetes mellitus with other specified complication: Secondary | ICD-10-CM

## 2023-01-30 DIAGNOSIS — E1149 Type 2 diabetes mellitus with other diabetic neurological complication: Secondary | ICD-10-CM

## 2023-01-30 NOTE — Telephone Encounter (Signed)
-----   Message from Alvina Chou sent at 01/29/2023  2:46 PM EDT ----- Regarding: lab orders for Tuesday, 6.4.24 Lab orders, thanks

## 2023-02-09 DIAGNOSIS — I152 Hypertension secondary to endocrine disorders: Secondary | ICD-10-CM

## 2023-02-09 DIAGNOSIS — E1149 Type 2 diabetes mellitus with other diabetic neurological complication: Secondary | ICD-10-CM

## 2023-02-09 DIAGNOSIS — E1159 Type 2 diabetes mellitus with other circulatory complications: Secondary | ICD-10-CM

## 2023-02-09 IMAGING — CT CT NECK W/ CM
4 series · 14 of 33 positions shown, 17 images · IV contrast (omnipaque)
Comparison: Head CT 02/08/2021 and CTA head  01/15/2016.

CLINICAL DATA: 60-year-old male who woke feeling like "airway was
closing off". "Neck mass workup".

EXAM:
CT NECK WITH CONTRAST
TECHNIQUE: Multidetector CT imaging of the neck was performed using the
standard protocol following the bolus administration of intravenous
contrast.
CONTRAST:  75mL OMNIPAQUE IOHEXOL 350 MG/ML SOLN

[Series 2: axial neck · axial · 0.64mm/px · z∈[-252,-68]mm · 5 of 139 slices shown, 7 images]
[im 24/139  soft-tissue]
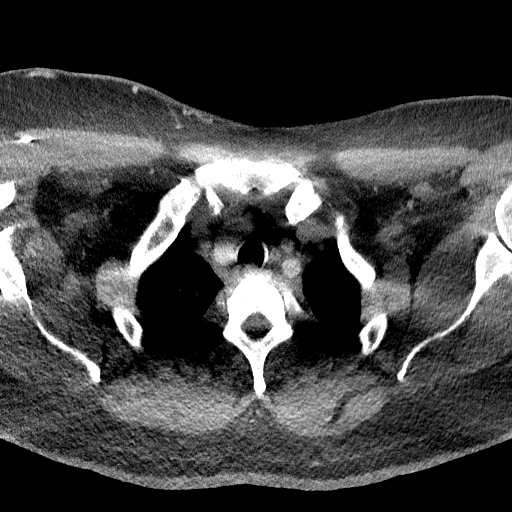
[im 24/139  bone]
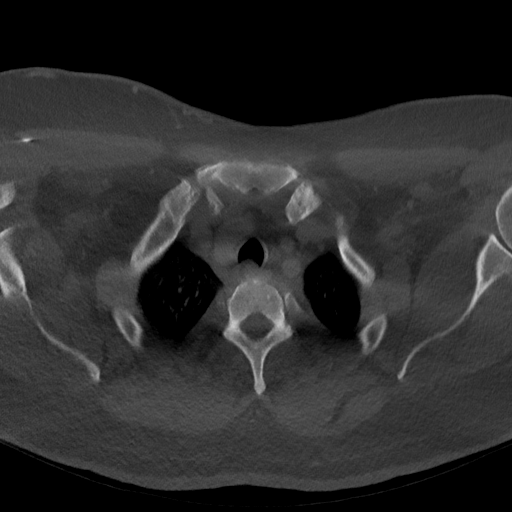
[im 47/139  bone]
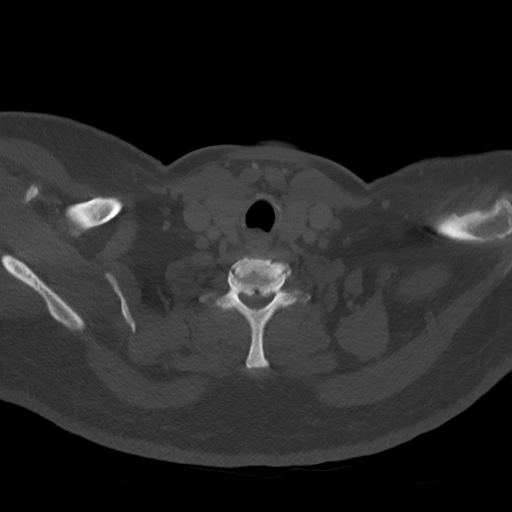
[im 70/139  bone]
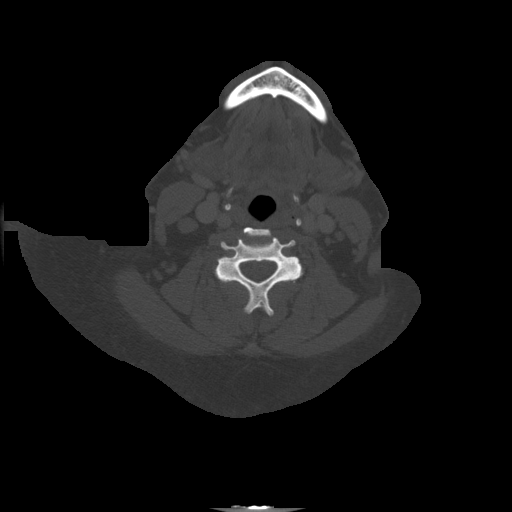
[im 93/139  bone]
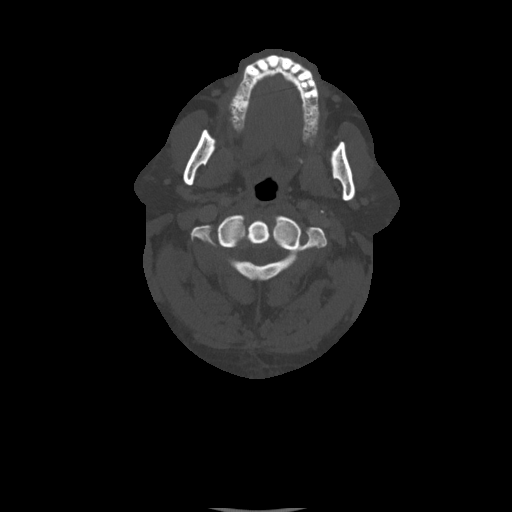
[im 116/139  soft-tissue]
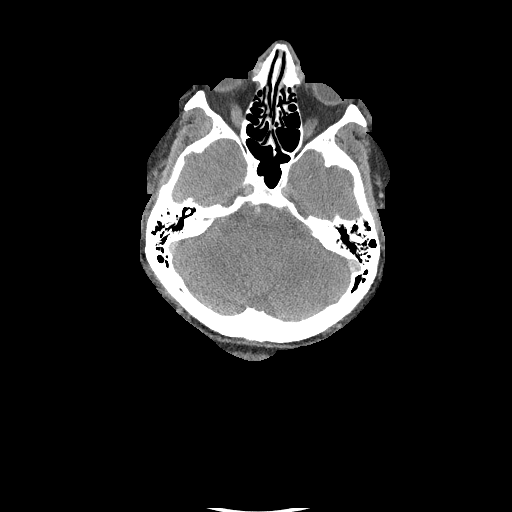
[im 116/139  bone]
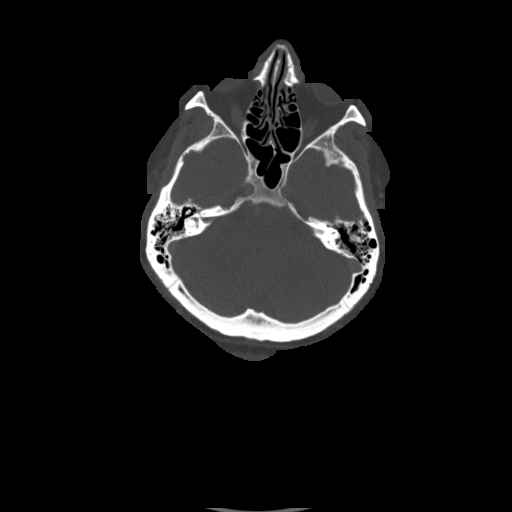

[Series 5: sag neck · sagittal · 0.59mm/px · 5 of 159 slices shown, 6 images]
[im 53/159  bone]
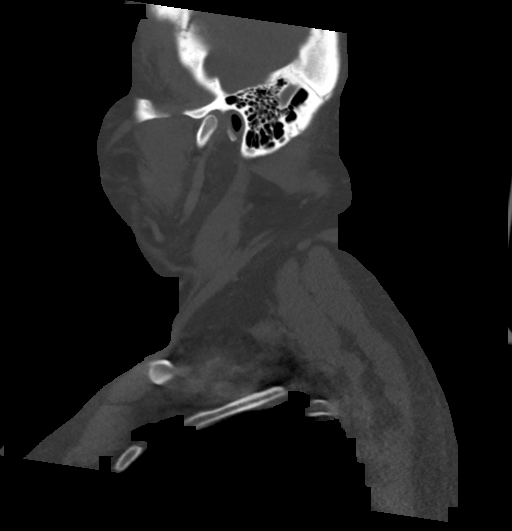
[im 66/159  bone]
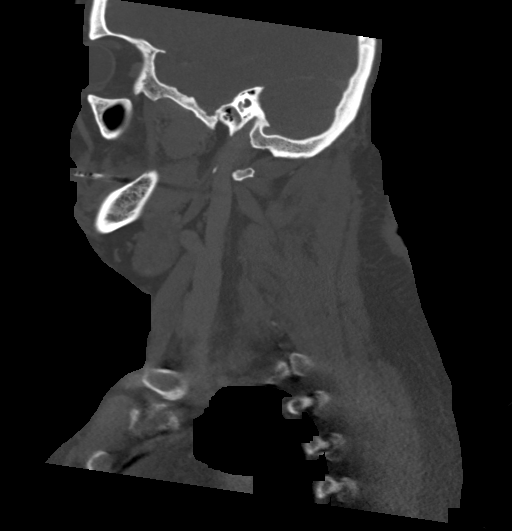
[im 80/159  soft-tissue]
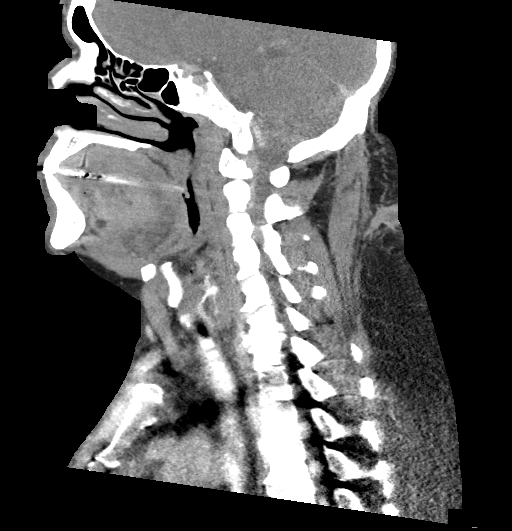
[im 80/159  bone]
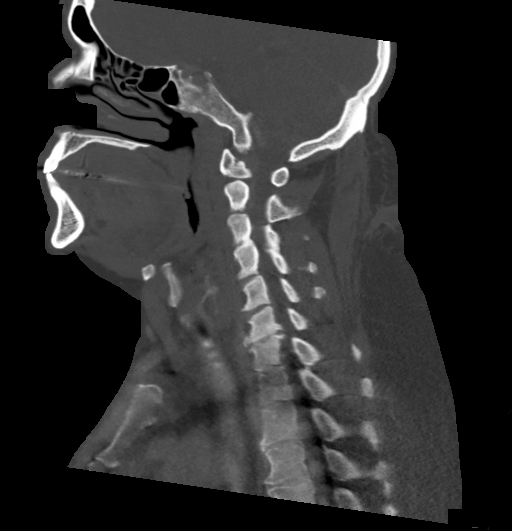
[im 93/159  bone]
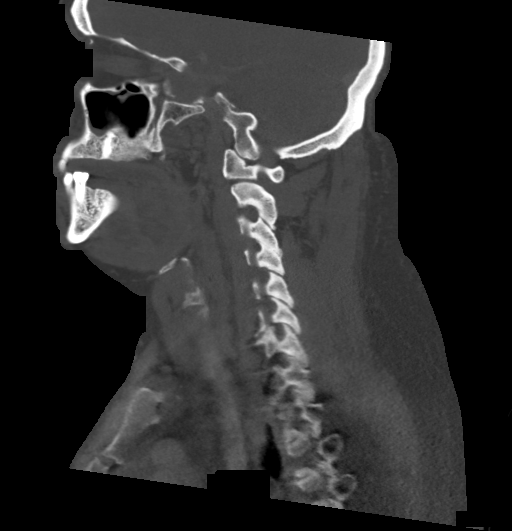
[im 106/159  bone]
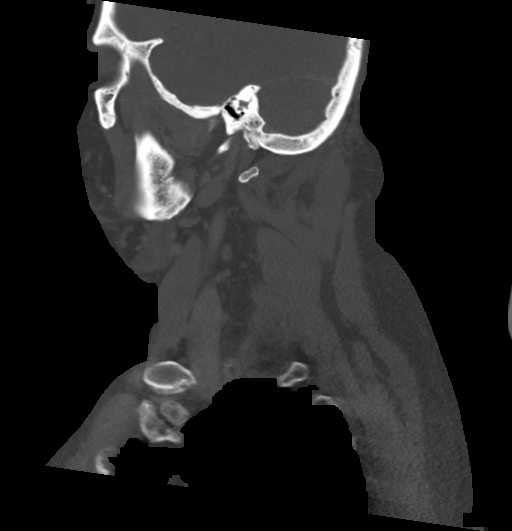

[Series 6: cor neck · coronal · 0.62mm/px · 3 of 155 slices shown]
[im 31/155  bone]
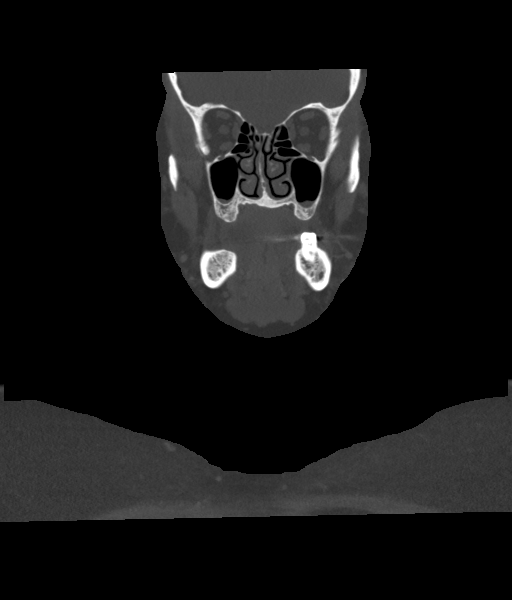
[im 62/155  bone]
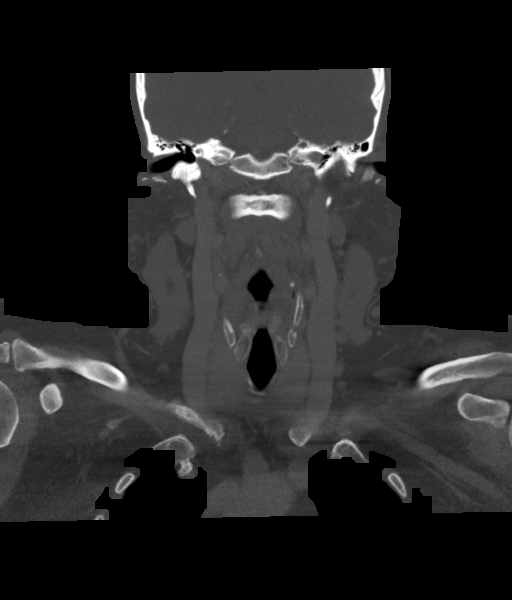
[im 93/155  bone]
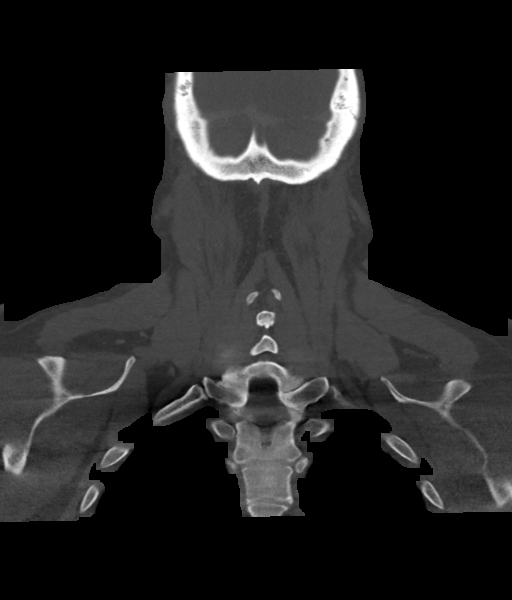

[Series 7: orthogonal ax · axial · 0.55mm/px · 1 of 139 slices shown]
[im 24/139  bone]
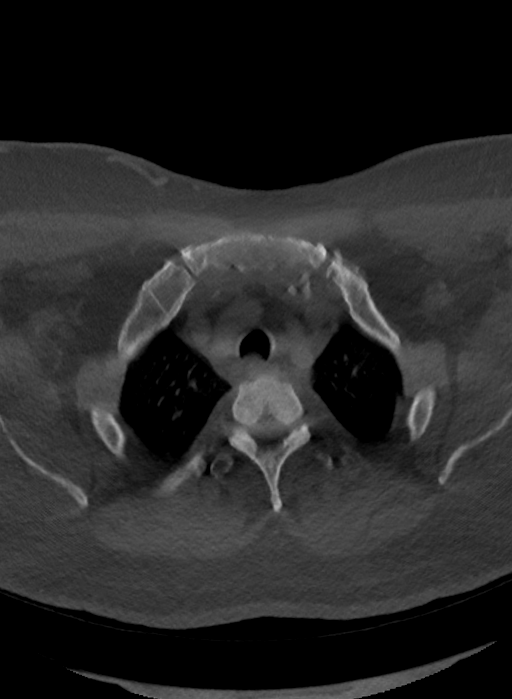

[14 of 33 positions shown; findings below may reference images not displayed]

FINDINGS: Pharynx and larynx: Laryngeal contours are within normal limits,
including the epiglottis. Hypopharynx within normal limits.

Bulky appearance of the uvula, which is at least 2 cm in length and
tracks caudally to the right (series 2, image 55 and series 6, image
61. But the remainder of the soft palate and oropharynx appear
within normal limits. Tonsils and adenoids might be surgically
absent. Parapharyngeal and retropharyngeal spaces appear normal.

Salivary glands: Negative.

Thyroid: Negative.

Lymph nodes: Bilateral neck nodes are small but do seem increased in
number (e.g. Series 6, image 73). But the largest cervical nodes are
8-9 mm short axis (such as right level IIIb series 2, image 70). No
cystic or necrotic nodes.

Vascular: Major vascular structures in the neck and at the skull
base are patent. Mild carotid atherosclerosis.

Limited intracranial: Negative.

Visualized orbits: Negative.

Mastoids and visualized paranasal sinuses: Well aerated. Trace
maxillary alveolar recess mucosal thickening. Tympanic cavities and
mastoids are clear.

Skeleton: Much of the posterior dentition is absent. No acute
osseous abnormality identified. Mild for age cervical spine
degeneration except at C6-C7.

Upper chest: Negative.

Other findings: Suboccipital posterior scalp/neck subcutaneous
scarring.
IMPRESSION: 1. Bulky appearance of the uvula which is at least 2 cm in length.
Consider acute uvula swelling, such as due to angioedema. But the
remainder of the soft palate, pharynx, and are within normal limits.

2. Nonspecific appearance of cervical lymph nodes, small but
increased number bilaterally.
Perhaps this could physiologic for this patient, or secondary to
acute URI. Currently the appearance does not rise to the level
typical of leukemia/lymphoma.

## 2023-02-13 ENCOUNTER — Other Ambulatory Visit (INDEPENDENT_AMBULATORY_CARE_PROVIDER_SITE_OTHER): Payer: Medicare HMO

## 2023-02-13 DIAGNOSIS — Z7984 Long term (current) use of oral hypoglycemic drugs: Secondary | ICD-10-CM | POA: Diagnosis not present

## 2023-02-13 DIAGNOSIS — E785 Hyperlipidemia, unspecified: Secondary | ICD-10-CM

## 2023-02-13 DIAGNOSIS — E1149 Type 2 diabetes mellitus with other diabetic neurological complication: Secondary | ICD-10-CM

## 2023-02-13 DIAGNOSIS — E1169 Type 2 diabetes mellitus with other specified complication: Secondary | ICD-10-CM

## 2023-02-14 LAB — COMPREHENSIVE METABOLIC PANEL
ALT: 13 U/L (ref 0–53)
AST: 16 U/L (ref 0–37)
Albumin: 4.1 g/dL (ref 3.5–5.2)
Alkaline Phosphatase: 55 U/L (ref 39–117)
BUN: 11 mg/dL (ref 6–23)
CO2: 26 mEq/L (ref 19–32)
Calcium: 9.3 mg/dL (ref 8.4–10.5)
Chloride: 100 mEq/L (ref 96–112)
Creatinine, Ser: 1.03 mg/dL (ref 0.40–1.50)
GFR: 77.98 mL/min (ref >60.00)
Glucose, Bld: 162 mg/dL — ABNORMAL HIGH (ref 70–99)
Potassium: 3.8 mEq/L (ref 3.5–5.1)
Sodium: 140 mEq/L (ref 135–145)
Total Bilirubin: 0.6 mg/dL (ref 0.2–1.2)
Total Protein: 7.5 g/dL (ref 6.0–8.3)

## 2023-02-14 LAB — LIPID PANEL
Cholesterol: 123 mg/dL (ref 0–200)
HDL: 45.2 mg/dL (ref >39.00)
LDL Cholesterol: 42 mg/dL (ref 0–99)
NonHDL: 78.19
Total CHOL/HDL Ratio: 3
Triglycerides: 181 mg/dL — ABNORMAL HIGH (ref 0.0–149.0)
VLDL: 36.2 mg/dL (ref 0.0–40.0)

## 2023-02-14 LAB — HEMOGLOBIN A1C: Hgb A1c MFr Bld: 7.4 % — ABNORMAL HIGH (ref 4.6–6.5)

## 2023-02-14 NOTE — Progress Notes (Signed)
No critical labs need to be addressed urgently. We will discuss labs in detail at upcoming office visit.   

## 2023-02-20 ENCOUNTER — Encounter: Payer: Self-pay | Admitting: Family Medicine

## 2023-02-20 ENCOUNTER — Ambulatory Visit (INDEPENDENT_AMBULATORY_CARE_PROVIDER_SITE_OTHER): Payer: Medicare HMO | Admitting: Family Medicine

## 2023-02-20 VITALS — BP 90/60 | HR 68 | Temp 97.2°F | Ht 74.0 in | Wt 310.0 lb

## 2023-02-20 DIAGNOSIS — Z6839 Body mass index (BMI) 39.0-39.9, adult: Secondary | ICD-10-CM

## 2023-02-20 DIAGNOSIS — E1149 Type 2 diabetes mellitus with other diabetic neurological complication: Secondary | ICD-10-CM

## 2023-02-20 DIAGNOSIS — Z7984 Long term (current) use of oral hypoglycemic drugs: Secondary | ICD-10-CM

## 2023-02-20 DIAGNOSIS — E785 Hyperlipidemia, unspecified: Secondary | ICD-10-CM

## 2023-02-20 DIAGNOSIS — E1143 Type 2 diabetes mellitus with diabetic autonomic (poly)neuropathy: Secondary | ICD-10-CM | POA: Diagnosis not present

## 2023-02-20 DIAGNOSIS — E1159 Type 2 diabetes mellitus with other circulatory complications: Secondary | ICD-10-CM | POA: Diagnosis not present

## 2023-02-20 DIAGNOSIS — I152 Hypertension secondary to endocrine disorders: Secondary | ICD-10-CM

## 2023-02-20 DIAGNOSIS — E1169 Type 2 diabetes mellitus with other specified complication: Secondary | ICD-10-CM

## 2023-02-20 MED ORDER — GABAPENTIN 100 MG PO CAPS
ORAL_CAPSULE | ORAL | 0 refills | Status: DC
Start: 2023-02-20 — End: 2023-03-20

## 2023-02-20 NOTE — Assessment & Plan Note (Signed)
Limited in activity given  Disability from back ° ° Keep up the great on diet change, increase activity. °

## 2023-02-20 NOTE — Assessment & Plan Note (Signed)
Stable, chronic.  Continue current medication.   HCTZ 25 mg p.o. daily as needed swelling 

## 2023-02-20 NOTE — Patient Instructions (Addendum)
Get back to water activity as able.  Keep working on lowering carbohydrates in diet.  Can try 200 mg extra gabapentin dose mid day for leg pain.

## 2023-02-20 NOTE — Assessment & Plan Note (Signed)
Stable, chronic.  Continue current medication.   Crestor 20 mg p.o. daily 

## 2023-02-20 NOTE — Assessment & Plan Note (Signed)
Chronic, significant improvement with addition of semaglutide at 0.5 mg weekly.  A1c has dropped from 12.4  now to 7.4 He is now only taking metformin XR 500 mg.  He has made significant changes with his eating habits and has cut back significantly on soda.  Of note he does have some side effects to semaglutide slower transit time of stool through colon.  He is currently being treated by GI for constipation.  We will hold off on titrating up the semaglutide to avoid possible further constipation side effect unless he plateaus out with improvement in sugar control and weight loss.

## 2023-02-20 NOTE — Assessment & Plan Note (Addendum)
Chronic, stable, but inadequately controlled.  Can try 200 mg extra gabapentin dose mid day for leg pain. Associated with diabetes.

## 2023-02-20 NOTE — Progress Notes (Signed)
Patient ID: Jamie Finley, male    DOB: May 26, 1961, 62 y.o.   MRN: [id number]  This visit was conducted in person.  BP 90/60 (BP Location: Right Arm, Patient Position: Sitting, Cuff Size: Large)   Pulse 68   Temp (!) 97.2 F (36.2 C) (Temporal)   Ht 6\' 2"  (1.88 m)   Wt (!) 310 lb (140.6 kg)   SpO2 97%   BMI 39.80 kg/m    CC:  Chief Complaint  Patient presents with   Diabetes    Subjective:   HPI: Jamie Finley is a 62 y.o. male presenting on 02/20/2023 for Diabetes  Diabetes:  improving control Only taking metformin XR 500 mg 1 tablet BID S/P nutritionist  On semaglutide 0.5 mg weekly.  Some slower transit of stool.. BM  once a week despite trulance for IBS C, benefiber and miralax.  Lab Results  Component Value Date   HGBA1C 7.4 (H) 02/13/2023  Using medications without difficulties: Hypoglycemic episodes: Hyperglycemic episodes: Feet problems: Blood Sugars averaging:  using CGM  FBS  eye exam within last year:   Has been trying to cut out cheese.  Was going to the [facility] for treadmill, upper body. ONce a week swimming in pool.   No further weight loss.  Wt Readings from Last 3 Encounters:  02/20/23 (!) 310 lb (140.6 kg)  11/17/22 (!) 306 lb 4 oz (138.9 kg)  10/03/22 (!) 310 lb (140.6 kg)     Hypertension:    HCTZ 25 mg daily prn BP Readings from Last 3 Encounters:  02/20/23 90/60  11/17/22 100/74  10/03/22 118/66  Using medication without problems or lightheadedness:  Chest pain with exertion: Edema: Short of breath: Average home BPs: Other issues:    Has some issues with cramping, spasm in buttock or foot mainly.  Peripheral neuropathy issue.. more significant at night.   Relevant past medical, surgical, family and social history reviewed and updated as indicated. Interim medical history since our last visit reviewed. Allergies and medications reviewed and updated. Outpatient Medications Prior to Visit  Medication Sig Dispense Refill    Accu-Chek Softclix Lancets lancets Use to check fasting blood sugar daily 100 each 3   acetaminophen (TYLENOL) 325 MG tablet Take 325 mg by mouth every 4 (four) hours as needed.      ammonium lactate (AMLACTIN) 12 % lotion Apply 1 Application topically as needed for dry skin. 400 g 0   aspirin 81 MG tablet Take 81 mg by mouth daily.     blood glucose meter kit and supplies Dispense based on patient and insurance preference. Use to check blood sugar two times a day. 1 each 0   Blood Glucose Monitoring Suppl (ACCU-CHEK GUIDE) w/Device KIT Use to check blood fasting blood sugar daily 1 kit 0   Cholecalciferol (VITAMIN D3) 50 MCG (2000 UT) TABS Take 5-7 tablets by mouth daily.     colchicine 0.6 MG tablet Take 1 tablet (0.6 mg total) by mouth daily as needed. 30 tablet 2   fluorometholone (FML) 0.1 % ophthalmic suspension Place 1 drop into both eyes 4 (four) times daily.     gabapentin (NEURONTIN) 400 MG capsule TAKE 1 CAPSULE BY MOUTH TWICE DAILY AS NEEDED 60 capsule 5   glucose blood (ACCU-CHEK GUIDE) test strip Use to check blood fasting blood sugar daily 100 each 3   hydrochlorothiazide (HYDRODIURIL) 25 MG tablet Take 1 tablet (25 mg total) by mouth daily as needed. 90 tablet 1   metFORMIN (GLUCOPHAGE-XR) 500  MG 24 hr tablet Take 1,000 mg by mouth 2 (two) times daily with a meal.     methocarbamol (ROBAXIN) 750 MG tablet Take 1 tablet (750 mg total) by mouth daily as needed for muscle spasms. 30 tablet 1   Multiple Vitamins-Minerals (ZINC PO) Take 1 tablet by mouth daily.     Plecanatide (TRULANCE) 3 MG TABS Take 1 tablet (3 mg total) by mouth daily. 90 tablet 3   polyethylene glycol (MIRALAX / GLYCOLAX) 17 g packet Take 17 g by mouth daily.     rosuvastatin (CRESTOR) 20 MG tablet TAKE 1 TABLET BY MOUTH AT BEDTIME 90 tablet 3   Semaglutide,0.25 or 0.5MG /DOS, (OZEMPIC, 0.25 OR 0.5 MG/DOSE,) 2 MG/3ML SOPN Inject 0.5 mg as directed once a week. 3 mL 11   sildenafil (REVATIO) 20 MG tablet Take 5  tablets prior to sexual activity 20 tablet 11   metFORMIN (GLUCOPHAGE-XR) 500 MG 24 hr tablet Take 1 tablet (500 mg total) by mouth 2 (two) times daily with a meal.     No facility-administered medications prior to visit.     Per HPI unless specifically indicated in ROS section below Review of Systems  Constitutional:  Negative for fatigue and fever.  HENT:  Negative for ear pain.   Eyes:  Negative for pain.  Respiratory:  Negative for cough and shortness of breath.   Cardiovascular:  Negative for chest pain, palpitations and leg swelling.  Gastrointestinal:  Negative for abdominal pain.  Genitourinary:  Negative for dysuria.  Musculoskeletal:  Negative for arthralgias.  Neurological:  Negative for syncope, light-headedness and headaches.  Psychiatric/Behavioral:  Negative for dysphoric mood.    Objective:  BP 90/60 (BP Location: Right Arm, Patient Position: Sitting, Cuff Size: Large)   Pulse 68   Temp (!) 97.2 F (36.2 C) (Temporal)   Ht 6\' 2"  (1.88 m)   Wt (!) 310 lb (140.6 kg)   SpO2 97%   BMI 39.80 kg/m   Wt Readings from Last 3 Encounters:  02/20/23 (!) 310 lb (140.6 kg)  11/17/22 (!) 306 lb 4 oz (138.9 kg)  10/03/22 (!) 310 lb (140.6 kg)      Physical Exam Constitutional:      Appearance: He is well-developed.     Comments: In wheelchair  HENT:     Head: Normocephalic.     Right Ear: Hearing normal.     Left Ear: Hearing normal.     Nose: Nose normal.  Neck:     Thyroid: No thyroid mass or thyromegaly.     Vascular: No carotid bruit.     Trachea: Trachea normal.  Cardiovascular:     Rate and Rhythm: Normal rate and regular rhythm.     Pulses: Normal pulses.     Heart sounds: Heart sounds not distant. No murmur heard.    No friction rub. No gallop.     Comments:  Discolored bilateral legs with venous insufficiency Pulmonary:     Effort: Pulmonary effort is normal. No respiratory distress.     Breath sounds: Normal breath sounds.  Musculoskeletal:      Right lower leg: 1+ Pitting Edema present.     Left lower leg: 1+ Pitting Edema present.  Skin:    General: Skin is warm and dry.     Findings: No rash.  Psychiatric:        Speech: Speech normal.        Behavior: Behavior normal.        Thought Content: Thought content  normal.      Results for orders placed or performed in visit on 02/13/23  Lipid panel  Result Value Ref Range   Cholesterol 123 0 - 200 mg/dL   Triglycerides 604.5 (H) 0.0 - 149.0 mg/dL   HDL 40.98 >11.91 mg/dL   VLDL 47.8 0.0 - 29.5 mg/dL   LDL Cholesterol 42 0 - 99 mg/dL   Total CHOL/HDL Ratio 3    NonHDL 78.19   Hemoglobin A1c  Result Value Ref Range   Hgb A1c MFr Bld 7.4 (H) 4.6 - 6.5 %  Comprehensive metabolic panel  Result Value Ref Range   Sodium 140 135 - 145 mEq/L   Potassium 3.8 3.5 - 5.1 mEq/L   Chloride 100 96 - 112 mEq/L   CO2 26 19 - 32 mEq/L   Glucose, Bld 162 (H) 70 - 99 mg/dL   BUN 11 6 - 23 mg/dL   Creatinine, Ser 6.21 0.40 - 1.50 mg/dL   Total Bilirubin 0.6 0.2 - 1.2 mg/dL   Alkaline Phosphatase 55 39 - 117 U/L   AST 16 0 - 37 U/L   ALT 13 0 - 53 U/L   Total Protein 7.5 6.0 - 8.3 g/dL   Albumin 4.1 3.5 - 5.2 g/dL   GFR 30.86 >57.84 mL/min   Calcium 9.3 8.4 - 10.5 mg/dL     COVID 19 screen:  No recent travel or known exposure to COVID19 The patient denies respiratory symptoms of COVID 19 at this time. The importance of social distancing was discussed today.   Assessment and Plan Type 2 diabetes mellitus with neurological complications [facility]) Assessment & Plan: Chronic, significant improvement with addition of semaglutide at 0.5 mg weekly.  A1c has dropped from 12.4  now to 7.4 He is now only taking metformin XR 500 mg.  He has made significant changes with his eating habits and has cut back significantly on soda.  Of note he does have some side effects to semaglutide slower transit time of stool through colon.  He is currently being treated by GI for constipation.  We will hold  off on titrating up the semaglutide to avoid possible further constipation side effect unless he plateaus out with improvement in sugar control and weight loss.   Hypertension associated with diabetes (HCC) Assessment & Plan: Stable, chronic.  Continue current medication.   HCTZ 25 mg p.o. daily as needed swelling   Class 2 severe obesity due to excess calories with serious comorbidity and body mass index (BMI) of 39.0 to 39.9 in adult [facility]) Assessment & Plan: Limited in activity given  Disability from back   Keep up the great on diet change, increase activity.   Hyperlipidemia associated with type 2 diabetes mellitus (HCC) Assessment & Plan: Stable, chronic.  Continue current medication.   Crestor 20 mg p.o. daily   Peripheral autonomic neuropathy due to diabetes mellitus (HCC) Assessment & Plan: Chronic, stable, but inadequately controlled.  Can try 200 mg extra gabapentin dose mid day for leg pain. Associated with diabetes.   Other orders -     Gabapentin; Can take 100 mg daily extra dose in addition to rregulardosing.  Dispense: 30 capsule; Refill: 0       Kerby Nora, MD

## 2023-03-06 DIAGNOSIS — I10 Essential (primary) hypertension: Secondary | ICD-10-CM | POA: Diagnosis not present

## 2023-03-06 DIAGNOSIS — H35033 Hypertensive retinopathy, bilateral: Secondary | ICD-10-CM | POA: Diagnosis not present

## 2023-03-06 DIAGNOSIS — H2513 Age-related nuclear cataract, bilateral: Secondary | ICD-10-CM | POA: Diagnosis not present

## 2023-03-06 DIAGNOSIS — E119 Type 2 diabetes mellitus without complications: Secondary | ICD-10-CM | POA: Diagnosis not present

## 2023-03-06 DIAGNOSIS — H16233 Neurotrophic keratoconjunctivitis, bilateral: Secondary | ICD-10-CM | POA: Diagnosis not present

## 2023-03-06 DIAGNOSIS — D3132 Benign neoplasm of left choroid: Secondary | ICD-10-CM | POA: Diagnosis not present

## 2023-03-06 DIAGNOSIS — H524 Presbyopia: Secondary | ICD-10-CM | POA: Diagnosis not present

## 2023-03-06 LAB — HM DIABETES EYE EXAM

## 2023-03-10 ENCOUNTER — Other Ambulatory Visit: Payer: Self-pay | Admitting: Family Medicine

## 2023-03-18 ENCOUNTER — Other Ambulatory Visit: Payer: Self-pay | Admitting: Family Medicine

## 2023-03-19 NOTE — Telephone Encounter (Signed)
Last office visit 02/20/2023 for DM.  Last refilled 02/20/2023 for #30 with no refills.  Next Appt: 05/24/23 for DM.

## 2023-03-22 ENCOUNTER — Encounter: Payer: Self-pay | Admitting: Podiatry

## 2023-03-22 ENCOUNTER — Ambulatory Visit: Payer: Medicare HMO | Admitting: Podiatry

## 2023-03-22 VITALS — BP 119/67 | HR 90

## 2023-03-22 DIAGNOSIS — M79674 Pain in right toe(s): Secondary | ICD-10-CM | POA: Diagnosis not present

## 2023-03-22 DIAGNOSIS — E1149 Type 2 diabetes mellitus with other diabetic neurological complication: Secondary | ICD-10-CM | POA: Diagnosis not present

## 2023-03-22 DIAGNOSIS — M79675 Pain in left toe(s): Secondary | ICD-10-CM | POA: Diagnosis not present

## 2023-03-22 DIAGNOSIS — B351 Tinea unguium: Secondary | ICD-10-CM | POA: Diagnosis not present

## 2023-03-25 NOTE — Progress Notes (Signed)
  Subjective:  Patient ID: Jamie Finley, male    DOB: [birth year],  MRN: [id number]  Jamie Finley presents to clinic today for at risk foot care with history of diabetic neuropathy and painful thick toenails that are difficult to trim. Pain interferes with ambulation. Aggravating factors include wearing enclosed shoe gear. Pain is relieved with periodic professional debridement.  Chief Complaint  Patient presents with   Nail Problem    "Toenails" Dr. Kerby Nora - 02/20/23, A1c - 7.6   New problem(s): None.   PCP is Bedsole, Amy E, MD.  No Known Allergies  Review of Systems: Negative except as noted in the HPI.  Objective: No changes noted in today's physical examination. Vitals:   03/22/23 1601  BP: 119/67  Pulse: 90   Jamie Finley is a pleasant 62 y.o. male in NAD. AAO x 3.  Vascular Examination: Capillary refill time immediate b/l. Vascular status intact b/l with palpable pedal pulses. Pedal hair present b/l. No pain with calf compression b/l. Skin temperature gradient WNL b/l. No cyanosis or clubbing b/l. No ischemia or gangrene noted b/l.   Neurological Examination: Protective sensation diminished with 10g monofilament b/l.  Dermatological Examination: Pedal skin with normal turgor, texture and tone b/l.  No open wounds. No interdigital macerations.   Toenails 1-5 b/l thick, discolored, elongated with subungual debris and pain on dorsal palpation.   No hyperkeratotic nor porokeratotic lesions present on today's visit.  Musculoskeletal Examination: Muscle strength 5/5 to all lower extremity muscle groups bilaterally. Utilizes motorized chair for mobility assistance.  Radiographs: None  Last A1c:      Latest Ref Rng & Units 02/13/2023    1:57 PM 11/17/2022   11:51 AM 08/18/2022   10:42 AM 05/11/2022    9:26 AM  Hemoglobin A1C  Hemoglobin-A1c 4.6 - 6.5 % 7.4  7.6  12.4  9.2    Assessment/Plan: 1. Pain due to onychomycosis of toenails of both feet   2. Type  2 diabetes mellitus with neurological complications [facility])     Patient was evaluated and treated. All patient's and/or POA's questions/concerns addressed on today's visit. Toenails 1-5 debrided in length and girth without incident. Continue soft, supportive shoe gear daily. Report any pedal injuries to medical professional. Call office if there are any questions/concerns. -Patient/POA to call should there be question/concern in the interim.   Return in about 3 months (around 06/22/2023).  Freddie Breech, DPM

## 2023-03-27 DIAGNOSIS — M21172 Varus deformity, not elsewhere classified, left ankle: Secondary | ICD-10-CM | POA: Diagnosis not present

## 2023-04-03 ENCOUNTER — Ambulatory Visit: Payer: Medicare HMO | Admitting: *Deleted

## 2023-04-03 DIAGNOSIS — E1149 Type 2 diabetes mellitus with other diabetic neurological complication: Secondary | ICD-10-CM

## 2023-04-03 DIAGNOSIS — I152 Hypertension secondary to endocrine disorders: Secondary | ICD-10-CM

## 2023-04-03 NOTE — Patient Instructions (Addendum)
Please call the care guide team at [phone] if you need to cancel or reschedule your appointment.   If you are experiencing a Mental Health or Behavioral Health Crisis or need someone to talk to, please call the Suicide and Crisis Lifeline: 988 call the [place] National Suicide Prevention Lifeline: 506-745-2164 or TTY: (959)729-1761 TTY 763-479-3351) to talk to a trained counselor call 1-800-273-TALK (toll free, 24 hour hotline) go to [facility] [address] [place] [phone]) call 911   Following is a copy of the CCM Program Consent:  CCM service includes personalized support from designated clinical staff supervised by the physician, including individualized plan of care and coordination with other care providers 24/7 contact phone numbers for assistance for urgent and routine care needs. Service will only be billed when office clinical staff spend 20 minutes or more in a month to coordinate care. Only one practitioner may furnish and bill the service in a calendar month. The patient may stop CCM services at amy time (effective at the end of the month) by phone call to the office staff. The patient will be responsible for cost sharing (co-pay) or up to 20% of the service fee (after annual deductible is met)  Following is a copy of your full provider care plan:   Goals Addressed             This Visit's Progress    CCM (DIABETES) EXPECTED OUTCOME:  MONITOR, SELF-MANAGE AND REDUCE SYMPTOMS OF DIABETES       Current Barriers:  Knowledge Deficits related to Diabetes management Chronic Disease Management support and education needs related to Diabetes, diet No Advanced Directives in place- pt declines information Patient reports he lives alone, has pastor from his church he can call on if needed, states overall independent with ADL, IADL's, continues to drive, has difficulty sometimes standing up too long for cooking and washing dishes, has  WC, ramp, cane walker and always uses DME when ambulating, states continues to get out and be active. Patient reports he has been checking CBG fasting once daily with readings 135-140, reports stopped taking ozempic 03/12/23 due to GI issues, did not let primary care provider know, pt states he does not always follow a special diet, was drinking sugary drinks, recently switched to diet drinks. Pt reports he has lost 16 pounds while taking ozempic and would like to lose more, pt states he is not sure if he has lost more weight. Patient pleased with decrease in AIC down to 7.6 on 11/17/22 and 7.4 on 02/13/23. PHQ9=5, pt states he saw counselor this year but no longer goes, social work/ counseling services offered, pt declined. Patient reports 3 falls this past year with no injury  Planned Interventions: Reviewed medications with patient and discussed importance of medication adherence;        Reviewed prescribed diet with patient carbohydrate modified; Counseled on importance of regular laboratory monitoring as prescribed;        Advised patient, providing education and rationale, to check cbg per MD order  and record        call provider for findings outside established parameters;       Review of patient status, including review of consultants reports, relevant laboratory and other test results, and medications completed;       Advised patient to discuss any issues with blood sugar, medications/ side effects with provider;      Safety precautions reinforced Reviewed importance of exercise and continuing at [facility] /  swimming In basket message sent to primary care provider reporting pt has not taken ozempic since 03/12/23 due to GI issues (constipation etc)  Symptom Management: Take medications as prescribed   Attend all scheduled provider appointments Call pharmacy for medication refills 3-7 days in advance of running out of medications Attend church or other social activities Perform all self care  activities independently  Perform IADL's (shopping, preparing meals, housekeeping, managing finances) independently Call provider office for new concerns or questions  check blood sugar at prescribed times: per doctor's order  check feet daily for cuts, sores or redness enter blood sugar readings and medication or insulin into daily log take the blood sugar log to all doctor visits take the blood sugar meter to all doctor visits set goal weight trim toenails straight across drink 6 to 8 glasses of water each day fill half of plate with vegetables limit fast food meals to no more than 1 per week manage portion size prepare main meal at home 3 to 5 days each week read food labels for fat, fiber, carbohydrates and portion size keep feet up while sitting wash and dry feet carefully every day wear comfortable, cotton socks Continue to work towards your goal of weight loss, consider getting a digital scale Congratulations on AIC of 7.4- keep up the good work! fall prevention strategies: change position slowly, use assistive device such as walker or cane (per provider recommendations) when walking, keep walkways clear, have good lighting in room. It is important to contact your provider if you have any falls, maintain muscle strength/tone by exercise per provider recommendations. Your doctor was notified that you are not taking ozempic  Follow Up Plan: Telephone follow up appointment with care management team member scheduled for:  06/07/23 at 215 pm       CCM (HYPERTENSION) EXPECTED OUTCOME: MONITOR, SELF-MANAGE AND REDUCE SYMPTOMS OF HYPERTENSION       Current Barriers:  Knowledge Deficits related to Hypertension management Chronic Disease Management support and education needs related to Hypertension, diet No Advanced Directives in place- pt declines Patient reports he has a wrist cuff but does not monitor blood pressure at home, states " it's always usually good" Patient reports he swims  at [facility] at least once per week Patient reports he received brace from [facility] [employer]), pt wears brace for left ankle and states " this is working well"  Planned Interventions: Evaluation of current treatment plan related to hypertension self management and patient's adherence to plan as established by provider;   Reviewed medications with patient and discussed importance of compliance;  Counseled on the importance of exercise goals with target of 150 minutes per week Advised patient, providing education and rationale, to monitor blood pressure daily and record, calling PCP for findings outside established parameters;  Advised patient to discuss any issues with blood pressure, medications with provider; Discussed complications of poorly controlled blood pressure such as heart disease, stroke, circulatory complications, vision complications, kidney impairment, sexual dysfunction;  Reinforced importance of adherence to low sodium diet  Symptom Management: Take medications as prescribed   Attend all scheduled provider appointments Call pharmacy for medication refills 3-7 days in advance of running out of medications Attend church or other social activities Perform all self care activities independently  Perform IADL's (shopping, preparing meals, housekeeping, managing finances) independently Call provider office for new concerns or questions  check blood pressure weekly choose a place to take my blood pressure (home, clinic or office, retail store) write blood pressure  results in a log or diary learn about high blood pressure keep a blood pressure log take blood pressure log to all doctor appointments keep all doctor appointments take medications for blood pressure exactly as prescribed eat more whole grains, fruits and vegetables, lean meats and healthy fats Follow low sodium diet- limit fast food Read labels for sodium content Continue swimming at [facility], any exercise is  helpful  Follow Up Plan: Telephone follow up appointment with care management team member scheduled for:   06/07/23 at 215 pm          Patient verbalizes understanding of instructions and care plan provided today and agrees to view in MyChart. Active MyChart status and patient understanding of how to access instructions and care plan via MyChart confirmed with patient.  Telephone follow up appointment with care management team member scheduled for:  06/07/23 at 215 pm

## 2023-04-03 NOTE — Chronic Care Management (AMB) (Signed)
Chronic Care Management   CCM RN Visit Note  04/03/2023 Name: Jamie Finley MRN: [id number] DOB: 10/10/1960  Subjective: Jamie Finley is a 62 y.o. year old male who is a primary care patient of Bedsole, Amy E, MD. The patient was referred to the Chronic Care Management team for assistance with care management needs subsequent to provider initiation of CCM services and plan of care.    Today's Visit:  Engaged with patient by telephone for follow up visit.        Goals Addressed             This Visit's Progress    CCM (DIABETES) EXPECTED OUTCOME:  MONITOR, SELF-MANAGE AND REDUCE SYMPTOMS OF DIABETES       Current Barriers:  Knowledge Deficits related to Diabetes management Chronic Disease Management support and education needs related to Diabetes, diet No Advanced Directives in place- pt declines information Patient reports he lives alone, has pastor from his church he can call on if needed, states overall independent with ADL, IADL's, continues to drive, has difficulty sometimes standing up too long for cooking and washing dishes, has WC, ramp, cane walker and always uses DME when ambulating, states continues to get out and be active. Patient reports he has been checking CBG fasting once daily with readings 135-140, reports stopped taking ozempic 03/12/23 due to GI issues, did not let primary care provider know, pt states he does not always follow a special diet, was drinking sugary drinks, recently switched to diet drinks. Pt reports he has lost 16 pounds while taking ozempic and would like to lose more, pt states he is not sure if he has lost more weight. Patient pleased with decrease in AIC down to 7.6 on 11/17/22 and 7.4 on 02/13/23. PHQ9=5, pt states he saw counselor this year but no longer goes, social work/ counseling services offered, pt declined. Patient reports 3 falls this past year with no injury  Planned Interventions: Reviewed medications with patient and discussed  importance of medication adherence;        Reviewed prescribed diet with patient carbohydrate modified; Counseled on importance of regular laboratory monitoring as prescribed;        Advised patient, providing education and rationale, to check cbg per MD order  and record        call provider for findings outside established parameters;       Review of patient status, including review of consultants reports, relevant laboratory and other test results, and medications completed;       Advised patient to discuss any issues with blood sugar, medications/ side effects with provider;      Safety precautions reinforced Reviewed importance of exercise and continuing at [facility] / swimming In basket message sent to primary care provider reporting pt has not taken ozempic since 03/12/23 due to GI issues (constipation etc)  Symptom Management: Take medications as prescribed   Attend all scheduled provider appointments Call pharmacy for medication refills 3-7 days in advance of running out of medications Attend church or other social activities Perform all self care activities independently  Perform IADL's (shopping, preparing meals, housekeeping, managing finances) independently Call provider office for new concerns or questions  check blood sugar at prescribed times: per doctor's order  check feet daily for cuts, sores or redness enter blood sugar readings and medication or insulin into daily log take the blood sugar log to all doctor visits take the blood sugar meter to all doctor visits set goal weight trim toenails straight  across drink 6 to 8 glasses of water each day fill half of plate with vegetables limit fast food meals to no more than 1 per week manage portion size prepare main meal at home 3 to 5 days each week read food labels for fat, fiber, carbohydrates and portion size keep feet up while sitting wash and dry feet carefully every day wear comfortable, cotton socks Continue to work  towards your goal of weight loss, consider getting a digital scale Congratulations on AIC of 7.4- keep up the good work! fall prevention strategies: change position slowly, use assistive device such as walker or cane (per provider recommendations) when walking, keep walkways clear, have good lighting in room. It is important to contact your provider if you have any falls, maintain muscle strength/tone by exercise per provider recommendations. Your doctor was notified that you are not taking ozempic  Follow Up Plan: Telephone follow up appointment with care management team member scheduled for:  06/07/23 at 215 pm       CCM (HYPERTENSION) EXPECTED OUTCOME: MONITOR, SELF-MANAGE AND REDUCE SYMPTOMS OF HYPERTENSION       Current Barriers:  Knowledge Deficits related to Hypertension management Chronic Disease Management support and education needs related to Hypertension, diet No Advanced Directives in place- pt declines Patient reports he has a wrist cuff but does not monitor blood pressure at home, states " it's always usually good" Patient reports he swims at [facility] at least once per week Patient reports he received brace from [facility] [employer]), pt wears brace for left ankle and states " this is working well"  Planned Interventions: Evaluation of current treatment plan related to hypertension self management and patient's adherence to plan as established by provider;   Reviewed medications with patient and discussed importance of compliance;  Counseled on the importance of exercise goals with target of 150 minutes per week Advised patient, providing education and rationale, to monitor blood pressure daily and record, calling PCP for findings outside established parameters;  Advised patient to discuss any issues with blood pressure, medications with provider; Discussed complications of poorly controlled blood pressure such as heart disease, stroke, circulatory complications, vision  complications, kidney impairment, sexual dysfunction;  Reinforced importance of adherence to low sodium diet  Symptom Management: Take medications as prescribed   Attend all scheduled provider appointments Call pharmacy for medication refills 3-7 days in advance of running out of medications Attend church or other social activities Perform all self care activities independently  Perform IADL's (shopping, preparing meals, housekeeping, managing finances) independently Call provider office for new concerns or questions  check blood pressure weekly choose a place to take my blood pressure (home, clinic or office, retail store) write blood pressure results in a log or diary learn about high blood pressure keep a blood pressure log take blood pressure log to all doctor appointments keep all doctor appointments take medications for blood pressure exactly as prescribed eat more whole grains, fruits and vegetables, lean meats and healthy fats Follow low sodium diet- limit fast food Read labels for sodium content Continue swimming at [facility], any exercise is helpful  Follow Up Plan: Telephone follow up appointment with care management team member scheduled for:   06/07/23 at 215 pm          Plan:Telephone follow up appointment with care management team member scheduled for:  06/07/23 at 215 pm  Irving Shows [facility], BSN RN Case Manager [facility]  [place] [phone]

## 2023-04-11 DIAGNOSIS — I10 Essential (primary) hypertension: Secondary | ICD-10-CM | POA: Diagnosis not present

## 2023-04-11 DIAGNOSIS — E1159 Type 2 diabetes mellitus with other circulatory complications: Secondary | ICD-10-CM | POA: Diagnosis not present

## 2023-04-11 DIAGNOSIS — Z7984 Long term (current) use of oral hypoglycemic drugs: Secondary | ICD-10-CM | POA: Diagnosis not present

## 2023-04-26 ENCOUNTER — Encounter (INDEPENDENT_AMBULATORY_CARE_PROVIDER_SITE_OTHER): Payer: Self-pay

## 2023-05-17 ENCOUNTER — Telehealth: Payer: Self-pay | Admitting: Family Medicine

## 2023-05-17 NOTE — Telephone Encounter (Signed)
FYI: This call has been transferred to Access Nurse. Once the result note has been entered staff can address the message at that time.  Patient called in with the following symptoms:  Red Word:blood in urine,difficulty urinating   Please advise at Jamie Finley [phone]  Message is routed to Provider Pool and [facility] Triage

## 2023-05-17 NOTE — Telephone Encounter (Signed)
Noted  

## 2023-05-17 NOTE — Telephone Encounter (Signed)
Per appt note pt already has appt with Dr Ermalene Searing on 05/18/23 at 9:20 am. Sending note to Dr Ermalene Searing and [place] pool.

## 2023-05-18 ENCOUNTER — Encounter: Payer: Self-pay | Admitting: Family Medicine

## 2023-05-18 ENCOUNTER — Ambulatory Visit (INDEPENDENT_AMBULATORY_CARE_PROVIDER_SITE_OTHER): Payer: Medicare HMO | Admitting: Family Medicine

## 2023-05-18 VITALS — BP 100/64 | HR 70 | Temp 97.3°F | Ht 74.0 in | Wt 304.5 lb

## 2023-05-18 DIAGNOSIS — R350 Frequency of micturition: Secondary | ICD-10-CM

## 2023-05-18 DIAGNOSIS — Z7984 Long term (current) use of oral hypoglycemic drugs: Secondary | ICD-10-CM

## 2023-05-18 DIAGNOSIS — R319 Hematuria, unspecified: Secondary | ICD-10-CM

## 2023-05-18 DIAGNOSIS — E1149 Type 2 diabetes mellitus with other diabetic neurological complication: Secondary | ICD-10-CM | POA: Diagnosis not present

## 2023-05-18 LAB — POC URINALSYSI DIPSTICK (AUTOMATED)
Bilirubin, UA: NEGATIVE
Glucose, UA: POSITIVE — AB
Ketones, UA: NEGATIVE
Leukocytes, UA: NEGATIVE
Nitrite, UA: NEGATIVE
Protein, UA: POSITIVE — AB
Spec Grav, UA: 1.015 (ref 1.010–1.025)
Urobilinogen, UA: 0.2 U/dL
pH, UA: 6 (ref 5.0–8.0)

## 2023-05-18 MED ORDER — CIPROFLOXACIN HCL 500 MG PO TABS: 500.0000 mg | ORAL_TABLET | Freq: Two times a day (BID) | ORAL | 0 refills | Status: AC

## 2023-05-18 NOTE — Assessment & Plan Note (Signed)
Chronic, was doing significantly better with blood sugar numbers on semaglutide but had to stop this given severe constipation.  He will work more aggressively on activity as tolerated and low-carb diet.  He is scheduled diabetes follow-up in 1 month.  Continue metformin 500 mg X are 2 tablets twice daily

## 2023-05-18 NOTE — Assessment & Plan Note (Signed)
Acute, most likely secondary to lower urinary tract infection, no signs and symptoms of kidney infection.  Possible prostatitis given perineal symptoms and flow change. Not clearly suggestive of kidney stone but patient does have some paralysis so it is unclear how much pain he would feel in this area.  Recommended pushing water, start Cipro 500 mg twice daily x 10 days.  Send urine for culture.  Return and ER precautions provided.

## 2023-05-18 NOTE — Progress Notes (Signed)
Patient ID: Jamie Finley, male    DOB: [birth year], 62 y.o.   MRN: [id number]  This visit was conducted in person.  BP 100/64 (BP Location: Right Arm, Patient Position: Sitting, Cuff Size: Large)   Pulse 70   Temp (!) 97.3 F (36.3 C) (Temporal)   Ht 6\' 2"  (1.88 m)   Wt (!) 304 lb 8 oz (138.1 kg)   SpO2 97%   BMI 39.10 kg/m    CC:  Chief Complaint  Patient presents with   Dark Urine   Urinary Frequency    Subjective:   HPI: Jamie Finley is a 62 y.o. 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   Darker brownish reddish urine.  No burning with urination. Some pressure with urination though.  Always has to push to urinate.  No abdominal pain.  No fever.  No flank pain.  Ache in perineum.  Blood sugars have been high off of  semaglutide  ( stopped in 03/2023) given GI SE.. worsened constipation.  Has been trying to eat low carb, has increased water.  Only taking metformin XR 500 mg  2 tablets  twice daily. Lab Results  Component Value Date   HGBA1C 7.4 (H) 02/13/2023  Reviewed recent chronic care management note from April 03, 2023      Relevant past medical, surgical, family and social history reviewed and updated as indicated. Interim medical history since our last visit reviewed. Allergies and medications reviewed and updated. Outpatient Medications Prior to Visit  Medication Sig Dispense Refill   Accu-Chek Softclix Lancets lancets Use to check fasting blood sugar daily 100 each 3   acetaminophen (TYLENOL) 325 MG tablet Take 325 mg by mouth every 4 (four) hours as needed.      ammonium lactate (AMLACTIN) 12 % lotion Apply 1 Application topically as needed for dry skin. 400 g 0   aspirin 81 MG tablet Take 81 mg  by mouth daily.     blood glucose meter kit and supplies Dispense based on patient and insurance preference. Use to check blood sugar two times a day. 1 each 0   Blood Glucose Monitoring Suppl (ACCU-CHEK GUIDE) w/Device KIT Use to check blood fasting blood sugar daily 1 kit 0   Cholecalciferol (VITAMIN D3) 50 MCG (2000 UT) TABS Take 5-7 tablets by mouth daily.     colchicine 0.6 MG tablet Take 1 tablet (0.6 mg total) by mouth daily as needed. 30 tablet 2   fluorometholone (FML) 0.1 % ophthalmic suspension Place 1 drop into both eyes 4 (four) times daily.     glucose blood (ACCU-CHEK GUIDE) test strip Use to check blood fasting blood sugar daily 100 each 3   hydrochlorothiazide (HYDRODIURIL) 25 MG tablet TAKE 1 TABLET BY MOUTH ONCE DAILY AS NEEDED 90 tablet 1   metFORMIN (GLUCOPHAGE-XR) 500 MG 24 hr tablet Take 1,000 mg by mouth 2 (two) times daily with a meal.     methocarbamol (ROBAXIN) 750 MG tablet Take 1 tablet (750 mg total) by mouth daily as needed for muscle spasms. 30 tablet 1   Multiple Vitamins-Minerals (ZINC PO) Take 1 tablet by mouth daily.     Plecanatide (TRULANCE) 3 MG TABS Take 1 tablet (3 mg total) by mouth daily. 90 tablet 3  polyethylene glycol (MIRALAX / GLYCOLAX) 17 g packet Take 17 g by mouth daily.     rosuvastatin (CRESTOR) 20 MG tablet TAKE 1 TABLET BY MOUTH AT BEDTIME 90 tablet 3   sildenafil (REVATIO) 20 MG tablet Take 5 tablets prior to sexual activity 20 tablet 11   gabapentin (NEURONTIN) 100 MG capsule CAN TAKE 100MG  DAILY EXTRA DOSE IN ADDITION TO REGULAR DOSING 30 capsule 0   gabapentin (NEURONTIN) 400 MG capsule TAKE 1 CAPSULE BY MOUTH TWICE DAILY AS NEEDED (Patient not taking: Reported on 04/03/2023) 60 capsule 5   Semaglutide,0.25 or 0.5MG /DOS, (OZEMPIC, 0.25 OR 0.5 MG/DOSE,) 2 MG/3ML SOPN Inject 0.5 mg as directed once a week. (Patient not taking: Reported on 04/03/2023) 3 mL 11   No facility-administered medications prior to visit.     Per HPI unless  specifically indicated in ROS section below Review of Systems  Constitutional:  Negative for fatigue and fever.  HENT:  Negative for ear pain.   Eyes:  Negative for pain.  Respiratory:  Negative for cough and shortness of breath.   Cardiovascular:  Negative for chest pain, palpitations and leg swelling.  Gastrointestinal:  Negative for abdominal pain.  Genitourinary:  Negative for dysuria.  Musculoskeletal:  Negative for arthralgias.  Neurological:  Negative for syncope, light-headedness and headaches.  Psychiatric/Behavioral:  Negative for dysphoric mood.    Objective:  BP 100/64 (BP Location: Right Arm, Patient Position: Sitting, Cuff Size: Large)   Pulse 70   Temp (!) 97.3 F (36.3 C) (Temporal)   Ht 6\' 2"  (1.88 m)   Wt (!) 304 lb 8 oz (138.1 kg)   SpO2 97%   BMI 39.10 kg/m   Wt Readings from Last 3 Encounters:  05/18/23 (!) 304 lb 8 oz (138.1 kg)  02/20/23 (!) 310 lb (140.6 kg)  11/17/22 (!) 306 lb 4 oz (138.9 kg)      Physical Exam Constitutional:      Appearance: He is well-developed.     Comments: In wheelchair  HENT:     Head: Normocephalic.     Right Ear: Hearing normal.     Left Ear: Hearing normal.     Nose: Nose normal.  Neck:     Thyroid: No thyroid mass or thyromegaly.     Vascular: No carotid bruit.     Trachea: Trachea normal.  Cardiovascular:     Rate and Rhythm: Normal rate and regular rhythm.     Pulses: Normal pulses.     Heart sounds: Heart sounds not distant. No murmur heard.    No friction rub. No gallop.     Comments: No peripheral edema Pulmonary:     Effort: Pulmonary effort is normal. No respiratory distress.     Breath sounds: Normal breath sounds.  Musculoskeletal:     Right lower leg: 1+ Pitting Edema present.     Left lower leg: 1+ Pitting Edema present.  Skin:    General: Skin is warm and dry.     Findings: No rash.  Psychiatric:        Speech: Speech normal.        Behavior: Behavior normal.        Thought Content: Thought  content normal.       Results for orders placed or performed in visit on 05/18/23  POCT Urinalysis Dipstick (Automated)  Result Value Ref Range   Color, UA Yellow    Clarity, UA Clear    Glucose, UA Positive (A) Negative   Bilirubin, UA Negative  Ketones, UA Negative    Spec Grav, UA 1.015 1.010 - 1.025   Blood, UA Large    pH, UA 6.0 5.0 - 8.0   Protein, UA Positive (A) Negative   Urobilinogen, UA 0.2 0.2 or 1.0 E.U./dL   Nitrite, UA Negative    Leukocytes, UA Negative Negative    Assessment and Plan  Hematuria, unspecified type Assessment & Plan: Acute, most likely secondary to lower urinary tract infection, no signs and symptoms of kidney infection.  Possible prostatitis given perineal symptoms and flow change. Not clearly suggestive of kidney stone but patient does have some paralysis so it is unclear how much pain he would feel in this area.  Recommended pushing water, start Cipro 500 mg twice daily x 10 days.  Send urine for culture.  Return and ER precautions provided.  Orders: -     Urine Culture  Urinary frequency -     POCT Urinalysis Dipstick (Automated) -     Urine Culture  Type 2 diabetes mellitus with neurological complications [facility]) Assessment & Plan: Chronic, was doing significantly better with blood sugar numbers on semaglutide but had to stop this given severe constipation.  He will work more aggressively on activity as tolerated and low-carb diet.  He is scheduled diabetes follow-up in 1 month.  Continue metformin 500 mg X are 2 tablets twice daily   Other orders -     Ciprofloxacin HCl; Take 1 tablet (500 mg total) by mouth 2 (two) times daily for 10 days.  Dispense: 20 tablet; Refill: 0    No follow-ups on file.   Kerby Nora, MD

## 2023-05-19 LAB — URINE CULTURE
MICRO NUMBER:: 15432119
Result:: NO GROWTH
SPECIMEN QUALITY:: ADEQUATE

## 2023-05-23 ENCOUNTER — Other Ambulatory Visit: Payer: Self-pay | Admitting: Family

## 2023-05-23 DIAGNOSIS — R300 Dysuria: Secondary | ICD-10-CM

## 2023-05-24 ENCOUNTER — Ambulatory Visit: Payer: Medicare HMO | Admitting: Family Medicine

## 2023-05-25 ENCOUNTER — Encounter: Payer: Self-pay | Admitting: Family Medicine

## 2023-05-29 ENCOUNTER — Ambulatory Visit (INDEPENDENT_AMBULATORY_CARE_PROVIDER_SITE_OTHER): Payer: Medicare HMO | Admitting: Family Medicine

## 2023-05-29 VITALS — BP 104/68 | HR 83 | Temp 97.7°F | Ht 74.0 in | Wt 300.4 lb

## 2023-05-29 DIAGNOSIS — E1149 Type 2 diabetes mellitus with other diabetic neurological complication: Secondary | ICD-10-CM

## 2023-05-29 DIAGNOSIS — E1159 Type 2 diabetes mellitus with other circulatory complications: Secondary | ICD-10-CM

## 2023-05-29 DIAGNOSIS — Z7984 Long term (current) use of oral hypoglycemic drugs: Secondary | ICD-10-CM

## 2023-05-29 DIAGNOSIS — L989 Disorder of the skin and subcutaneous tissue, unspecified: Secondary | ICD-10-CM | POA: Diagnosis not present

## 2023-05-29 DIAGNOSIS — R319 Hematuria, unspecified: Secondary | ICD-10-CM | POA: Diagnosis not present

## 2023-05-29 DIAGNOSIS — I152 Hypertension secondary to endocrine disorders: Secondary | ICD-10-CM

## 2023-05-29 DIAGNOSIS — M79601 Pain in right arm: Secondary | ICD-10-CM | POA: Diagnosis not present

## 2023-05-29 LAB — POC URINALSYSI DIPSTICK (AUTOMATED)
Bilirubin, UA: NEGATIVE
Blood, UA: NEGATIVE
Glucose, UA: POSITIVE — AB
Ketones, UA: NEGATIVE
Leukocytes, UA: NEGATIVE
Nitrite, UA: NEGATIVE
Protein, UA: POSITIVE — AB
Spec Grav, UA: 1.02 (ref 1.010–1.025)
Urobilinogen, UA: 0.2 U/dL
pH, UA: 6 (ref 5.0–8.0)

## 2023-05-29 LAB — POCT GLYCOSYLATED HEMOGLOBIN (HGB A1C): Hemoglobin A1C: 10.6 % — AB (ref 4.0–5.6)

## 2023-05-29 MED ORDER — RYBELSUS 3 MG PO TABS: 3.0000 mg | ORAL_TABLET | Freq: Every day | ORAL | 0 refills | Status: DC

## 2023-05-29 NOTE — Patient Instructions (Addendum)
Will try low dose Rybelsus for 30 days.Marland Kitchen after that if not worsening of constipation we will plan to increase to 7 mg.  Will place referral to dermatology for skin issues. Can use diclofenac twice  daily for 1-2 weeks.  Apply ice or heat.  Start gentle home PT.  Cal;l if not improving as expected.

## 2023-05-29 NOTE — Assessment & Plan Note (Addendum)
Chronic, was doing significantly better with blood sugar numbers on semaglutide but had to stop this given severe constipation. Will try low dose Rybelsus for 30 days.Marland Kitchen after that if not worsening of constipation we will plan to increase to 7 mg.  Continue metformin 500 mg X are 2 tablets twice daily

## 2023-05-29 NOTE — Progress Notes (Signed)
Patient ID: Jamie Finley, male    DOB: 06/05/1961, 62 y.o.   MRN: [id number]  This visit was conducted in person.  BP 104/68 (BP Location: Left Arm, Patient Position: Sitting, Cuff Size: Large)   Pulse 83   Temp 97.7 F (36.5 C) (Temporal)   Ht 6\' 2"  (1.88 m)   Wt (!) 300 lb 6 oz (136.2 kg)   SpO2 97%   BMI 38.57 kg/m    CC:  Chief Complaint  Patient presents with   Diabetes   Arm Pain    Trouble lifting arms   Cyst    On Back of Neck   Skin issues    Subjective:   HPI: Jamie Finley is a 62 y.o. male presenting on 05/29/2023 for Diabetes, Arm Pain (Trouble lifting arms), Cyst (On Back of Neck), and Skin issues  Pain in right anterior upper arm with abduction and int rotation.  NO shouleder  No fall, no known injury.   Recent hematuria Urine culture returned clear After antibiotics.Marland Kitchen no further urinary symptoms ( nml flow and no urgency)... may have had prostatitis. Cipro 500 mg twice daily x 10 days   Diabetes: Worsened control of diabetes since needing to stop GLP-1 given severe constipation. Lab Results  Component Value Date   HGBA1C 10.6 (A) 05/29/2023  In the past he has tolerated glipizide , Actos has caused peripheral swelling  Was on insulin in past Using medications without difficulties: Hypoglycemic episodes: Hyperglycemic episodes: Feet problems: Blood Sugars averaging: eye exam within last year: Wt Readings from Last 3 Encounters:  05/29/23 (!) 300 lb 6 oz (136.2 kg)  05/18/23 (!) 304 lb 8 oz (138.1 kg)  02/20/23 (!) 310 lb (140.6 kg)     Relevant past medical, surgical, family and social history reviewed and updated as indicated. Interim medical history since our last visit reviewed. Allergies and medications reviewed and updated. Outpatient Medications Prior to Visit  Medication Sig Dispense Refill   Accu-Chek Softclix Lancets lancets Use to check fasting blood sugar daily 100 each 3   acetaminophen (TYLENOL) 325 MG tablet Take 325  mg by mouth every 4 (four) hours as needed.      ammonium lactate (AMLACTIN) 12 % lotion Apply 1 Application topically as needed for dry skin. 400 g 0   aspirin 81 MG tablet Take 81 mg by mouth daily.     blood glucose meter kit and supplies Dispense based on patient and insurance preference. Use to check blood sugar two times a day. 1 each 0   Blood Glucose Monitoring Suppl (ACCU-CHEK GUIDE) w/Device KIT Use to check blood fasting blood sugar daily 1 kit 0   Cholecalciferol (VITAMIN D3) 50 MCG (2000 UT) TABS Take 5-7 tablets by mouth daily.     colchicine 0.6 MG tablet Take 1 tablet (0.6 mg total) by mouth daily as needed. 30 tablet 2   fluorometholone (FML) 0.1 % ophthalmic suspension Place 1 drop into both eyes 4 (four) times daily.     glucose blood (ACCU-CHEK GUIDE) test strip Use to check blood fasting blood sugar daily 100 each 3   methocarbamol (ROBAXIN) 750 MG tablet Take 1 tablet (750 mg total) by mouth daily as needed for muscle spasms. 30 tablet 1   Multiple Vitamins-Minerals (ZINC PO) Take 1 tablet by mouth daily.     Plecanatide (TRULANCE) 3 MG TABS Take 1 tablet (3 mg total) by mouth daily. 90 tablet 3   polyethylene glycol (MIRALAX / GLYCOLAX) 17 g packet  Take 17 g by mouth daily.     sildenafil (REVATIO) 20 MG tablet Take 5 tablets prior to sexual activity 20 tablet 11   hydrochlorothiazide (HYDRODIURIL) 25 MG tablet TAKE 1 TABLET BY MOUTH ONCE DAILY AS NEEDED 90 tablet 1   metFORMIN (GLUCOPHAGE-XR) 500 MG 24 hr tablet Take 1,000 mg by mouth 2 (two) times daily with a meal.     rosuvastatin (CRESTOR) 20 MG tablet TAKE 1 TABLET BY MOUTH AT BEDTIME 90 tablet 3   No facility-administered medications prior to visit.     Per HPI unless specifically indicated in ROS section below Review of Systems  Constitutional:  Negative for fatigue and fever.  HENT:  Negative for ear pain.   Eyes:  Negative for pain.  Respiratory:  Negative for cough and shortness of breath.    Cardiovascular:  Negative for chest pain, palpitations and leg swelling.  Gastrointestinal:  Positive for constipation. Negative for abdominal pain.  Genitourinary:  Negative for dysuria.  Musculoskeletal:  Negative for arthralgias.  Skin:  Positive for rash.  Neurological:  Negative for syncope, light-headedness and headaches.  Psychiatric/Behavioral:  Negative for dysphoric mood.    Objective:  BP 104/68 (BP Location: Left Arm, Patient Position: Sitting, Cuff Size: Large)   Pulse 83   Temp 97.7 F (36.5 C) (Temporal)   Ht 6\' 2"  (1.88 m)   Wt (!) 300 lb 6 oz (136.2 kg)   SpO2 97%   BMI 38.57 kg/m   Wt Readings from Last 3 Encounters:  05/29/23 (!) 300 lb 6 oz (136.2 kg)  05/18/23 (!) 304 lb 8 oz (138.1 kg)  02/20/23 (!) 310 lb (140.6 kg)      Physical Exam Constitutional:      Appearance: He is well-developed.  HENT:     Head: Normocephalic.     Right Ear: Hearing normal.     Left Ear: Hearing normal.     Nose: Nose normal.  Neck:     Thyroid: No thyroid mass or thyromegaly.     Vascular: No carotid bruit.     Trachea: Trachea normal.  Cardiovascular:     Rate and Rhythm: Normal rate and regular rhythm.     Pulses: Normal pulses.     Heart sounds: Heart sounds not distant. No murmur heard.    No friction rub. No gallop.     Comments: No peripheral edema Pulmonary:     Effort: Pulmonary effort is normal. No respiratory distress.     Breath sounds: Normal breath sounds.  Skin:    General: Skin is warm and dry.     Findings: Lesion present. No rash.     Comments: Mobile 3 cm subcutaneous cyst in left upper neck no associated erythema  Psychiatric:        Speech: Speech normal.        Behavior: Behavior normal.        Thought Content: Thought content normal.       Results for orders placed or performed in visit on 05/29/23  POCT glycosylated hemoglobin (Hb A1C)  Result Value Ref Range   Hemoglobin A1C 10.6 (A) 4.0 - 5.6 %   HbA1c POC (<> result, manual  entry)     HbA1c, POC (prediabetic range)     HbA1c, POC (controlled diabetic range)    POCT Urinalysis Dipstick (Automated)  Result Value Ref Range   Color, UA Yellow    Clarity, UA Clear    Glucose, UA Positive (A) Negative   Bilirubin,  UA Negative    Ketones, UA Negative    Spec Grav, UA 1.020 1.010 - 1.025   Blood, UA Negative    pH, UA 6.0 5.0 - 8.0   Protein, UA Positive (A) Negative   Urobilinogen, UA 0.2 0.2 or 1.0 E.U./dL   Nitrite, UA Negative    Leukocytes, UA Negative Negative    Assessment and Plan  Type 2 diabetes mellitus with neurological complications (HCC) Assessment & Plan: Chronic, was doing significantly better with blood sugar numbers on semaglutide but had to stop this given severe constipation. Will try low dose Rybelsus for 30 days.Marland Kitchen after that if not worsening of constipation we will plan to increase to 7 mg.  Continue metformin 500 mg X are 2 tablets twice daily  Orders: -     POCT glycosylated hemoglobin (Hb A1C)  Skin lesions Assessment & Plan: Acute multiple, unremarkable subcutaneous cyst left upper neck but patient has multiple other areas of concern so I will refer him to dermatology for full skin evaluation.  Orders: -     Ambulatory referral to Dermatology  Hematuria, unspecified type Assessment & Plan: Acute, likely associated with prostatitis.  Symptoms and hematuria resolved on recheck of urine today.  Orders: -     POCT Urinalysis Dipstick (Automated)  Hypertension associated with diabetes (HCC) Assessment & Plan: Stable, chronic.  Continue current medication.   HCTZ 25 mg p.o. daily as needed swelling   Right arm pain Assessment & Plan: Acute, unclear etiology not really focal around shoulder more lower over her lateral bicep, possible referred shoulder bursitis. Can use diclofenac twice  daily for 1-2 weeks.  Apply ice or heat.  Start gentle home PT.   Other orders -     Rybelsus; Take 1 tablet (3 mg total) by mouth  daily.  Dispense: 30 tablet; Refill: 0    No follow-ups on file.   Kerby Nora, MD

## 2023-05-31 ENCOUNTER — Encounter: Payer: Self-pay | Admitting: *Deleted

## 2023-06-06 ENCOUNTER — Other Ambulatory Visit: Payer: Self-pay | Admitting: Family Medicine

## 2023-06-06 ENCOUNTER — Other Ambulatory Visit: Payer: Self-pay | Admitting: *Deleted

## 2023-06-06 DIAGNOSIS — I152 Hypertension secondary to endocrine disorders: Secondary | ICD-10-CM

## 2023-06-06 DIAGNOSIS — M79601 Pain in right arm: Secondary | ICD-10-CM | POA: Insufficient documentation

## 2023-06-06 DIAGNOSIS — E785 Hyperlipidemia, unspecified: Secondary | ICD-10-CM

## 2023-06-06 DIAGNOSIS — E1149 Type 2 diabetes mellitus with other diabetic neurological complication: Secondary | ICD-10-CM

## 2023-06-06 DIAGNOSIS — E1159 Type 2 diabetes mellitus with other circulatory complications: Secondary | ICD-10-CM

## 2023-06-06 MED ORDER — ROSUVASTATIN CALCIUM 20 MG PO TABS: 20.0000 mg | ORAL_TABLET | Freq: Every day | ORAL | 3 refills | Status: DC

## 2023-06-06 MED ORDER — METFORMIN HCL ER 500 MG PO TB24
1000.0000 mg | ORAL_TABLET | Freq: Two times a day (BID) | ORAL | 3 refills | Status: AC
Start: 2023-06-06 — End: ?

## 2023-06-06 MED ORDER — HYDROCHLOROTHIAZIDE 25 MG PO TABS
25.0000 mg | ORAL_TABLET | Freq: Every day | ORAL | 3 refills | Status: AC | PRN
Start: 2023-06-06 — End: ?

## 2023-06-06 NOTE — Assessment & Plan Note (Signed)
Stable, chronic.  Continue current medication.   HCTZ 25 mg p.o. daily as needed swelling

## 2023-06-06 NOTE — Assessment & Plan Note (Signed)
Acute, unclear etiology not really focal around shoulder more lower over her lateral bicep, possible referred shoulder bursitis. Can use diclofenac twice  daily for 1-2 weeks.  Apply ice or heat.  Start gentle home PT.

## 2023-06-06 NOTE — Assessment & Plan Note (Signed)
Acute multiple, unremarkable subcutaneous cyst left upper neck but patient has multiple other areas of concern so I will refer him to dermatology for full skin evaluation.

## 2023-06-06 NOTE — Addendum Note (Signed)
Addended by: Damita Lack on: 06/06/2023 10:35 AM   Modules accepted: Orders

## 2023-06-06 NOTE — Assessment & Plan Note (Signed)
Acute, likely associated with prostatitis.  Symptoms and hematuria resolved on recheck of urine today.

## 2023-06-07 ENCOUNTER — Telehealth: Payer: Medicare HMO

## 2023-06-25 ENCOUNTER — Other Ambulatory Visit: Payer: Self-pay | Admitting: Family Medicine

## 2023-06-25 ENCOUNTER — Ambulatory Visit (INDEPENDENT_AMBULATORY_CARE_PROVIDER_SITE_OTHER): Payer: Medicare HMO | Admitting: Podiatry

## 2023-06-25 ENCOUNTER — Encounter: Payer: Self-pay | Admitting: Podiatry

## 2023-06-25 DIAGNOSIS — E1149 Type 2 diabetes mellitus with other diabetic neurological complication: Secondary | ICD-10-CM

## 2023-06-25 DIAGNOSIS — B351 Tinea unguium: Secondary | ICD-10-CM | POA: Diagnosis not present

## 2023-06-25 DIAGNOSIS — M79675 Pain in left toe(s): Secondary | ICD-10-CM | POA: Diagnosis not present

## 2023-06-25 DIAGNOSIS — Z8631 Personal history of diabetic foot ulcer: Secondary | ICD-10-CM | POA: Diagnosis not present

## 2023-06-25 DIAGNOSIS — M79674 Pain in right toe(s): Secondary | ICD-10-CM

## 2023-06-25 DIAGNOSIS — E119 Type 2 diabetes mellitus without complications: Secondary | ICD-10-CM

## 2023-06-25 DIAGNOSIS — G8220 Paraplegia, unspecified: Secondary | ICD-10-CM | POA: Diagnosis not present

## 2023-06-25 DIAGNOSIS — M21372 Foot drop, left foot: Secondary | ICD-10-CM | POA: Diagnosis not present

## 2023-06-25 NOTE — Telephone Encounter (Signed)
Spoke with Jamie Finley.  He is tolerating the 3 mg and is agreeable to increase to 7 mg.   Okay to send in new Rx for Rybelsus 7 mg.   If so, please sign new Rx and deny refill request for the 3 mg.

## 2023-06-26 MED ORDER — RYBELSUS 7 MG PO TABS: 7.0000 mg | ORAL_TABLET | Freq: Every day | ORAL | 11 refills | Status: DC

## 2023-07-02 NOTE — Progress Notes (Signed)
  Subjective:  Patient ID: Jamie Finley, male    DOB: 01/05/1961,  MRN: [id number]  Jamie Finley presents to clinic today for for annual diabetic foot examination  Chief Complaint  Patient presents with   Diabetes    A1C-10 PCPV-05/2023   New problem(s): None.   PCP is Bedsole, Amy E, MD.  No Known Allergies  Review of Systems: Negative except as noted in the HPI.  Objective: No changes noted in today's physical examination. There were no vitals filed for this visit. Jamie Finley is a pleasant 62 y.o. male in NAD. AAO x 3.   Title   Diabetic Foot Exam - detailed Date & Time: 06/25/2023  2:45 PM   Visual Foot Exam completed.: Yes  Is there a history of foot ulcer?: Yes Is there a foot ulcer now?: No Is there swelling?: No Is there elevated skin temperature?: No Is there abnormal foot shape?: No Is there a claw toe deformity?: No Are the toenails long?: Yes Are the toenails thick?: Yes Are the toenails ingrown?: No Is the skin thin, fragile, shiny and hairless?": No Normal Range of Motion?: No Is there foot or ankle muscle weakness?: Yes (Comment: Dropfoot RLE) Do you have pain in calf while walking?: No Are the shoes appropriate in style and fit?: Yes Can the patient see the bottom of their feet?: No Pulse Foot Exam completed.: Yes   Right Posterior Tibialis: Present Left posterior Tibialis: Present   Right Dorsalis Pedis: Present Left Dorsalis Pedis: Present     Sensory Foot Exam Completed.: Yes Semmes-Weinstein Monofilament Test "+" means "has sensation" and "-" means "no sensation"  R Foot Test Control: Pos L Foot Test Control: Pos   R Site 1-Great Toe: Neg L Site 1-Great Toe: Neg   R Site 4: Neg L Site 4: Neg   R site 5: Neg L Site 5: Neg  R Site 6: Pos L Site 6: Pos     Image components are not supported.   Image components are not supported. Image components are not supported.  Tuning [place] Right vibratory: present Left vibratory: present   Comments Patient uses wheelchair for mobility.     Assessment/Plan: 1. Pain due to onychomycosis of toenails of both feet   2. History of diabetic ulcer of foot   3. Left foot drop   4. Paraparesis of both lower limbs (HCC)   5. Type 2 diabetes mellitus with neurological complications (HCC)   6. Encounter for diabetic foot exam (HCC)     -Consent given for treatment as described below: -Examined patient. -Diabetic foot examination performed today. -Patient to continue soft, supportive shoe gear daily. -Toenails 1-5 b/l were debrided in length and girth with sterile nail nippers and dremel without iatrogenic bleeding.  -Patient/POA to call should there be question/concern in the interim.   Return in about 3 months (around 09/25/2023).  Jamie Finley, DPM

## 2023-07-18 ENCOUNTER — Encounter: Payer: Self-pay | Admitting: Family Medicine

## 2023-07-19 ENCOUNTER — Telehealth: Payer: Self-pay | Admitting: *Deleted

## 2023-07-19 NOTE — Progress Notes (Signed)
  Care Coordination Note  07/19/2023 Name: Jamie Finley MRN: [id number] DOB: [birth year]  Jamie Finley is a 62 y.o. year old male who is a primary care patient of Bedsole, Amy E, MD and is actively engaged with the care management team. I reached out to Lauretta Grill by phone today to assist with re-scheduling a follow up visit with the RN Case Manager  Follow up plan: Unsuccessful telephone outreach attempt made. A HIPAA compliant phone message was left for the patient providing contact information and requesting a return call.   Burman Nieves, CCMA Care Coordination Care Guide Direct Dial: [phone]

## 2023-07-25 ENCOUNTER — Other Ambulatory Visit: Payer: Self-pay | Admitting: Family Medicine

## 2023-07-25 DIAGNOSIS — E785 Hyperlipidemia, unspecified: Secondary | ICD-10-CM

## 2023-07-25 NOTE — Telephone Encounter (Signed)
Patient returned call,he needs rx sent to [facility] mail delivery.

## 2023-07-25 NOTE — Telephone Encounter (Signed)
Left message for Mr. Jamie Finley to verify if he needs his rosuvastatin sent to [facility] [address]  Refill was last sent to [name] mail order on 06/06/2023 for #90 with 3 refills.

## 2023-07-26 NOTE — Progress Notes (Signed)
  Care Coordination Note  07/26/2023 Name: Tralyn Benitez MRN: [id number] DOB: 1960-11-14  Jeremian Velarde is a 62 y.o. year old male who is a primary care patient of Ermalene Searing, Amy E, MD and is actively engaged with the care management team. I reached out to Lauretta Grill by phone today to assist with re-scheduling a follow up visit with the RN Case Manager  Follow up plan: We have been unable to make contact with the patient for follow up.   Burman Nieves, CCMA Care Coordination Care Guide Direct Dial: [phone]

## 2023-07-30 ENCOUNTER — Telehealth: Payer: Self-pay | Admitting: *Deleted

## 2023-07-30 NOTE — Progress Notes (Addendum)
  Care Coordination Note  07/30/2023 Name: Jamie Finley MRN: [id number] DOB: [birth year]  Jamie Finley is a 62 y.o. year old male who is a primary care patient of Ermalene Searing, Amy E, MD and is actively engaged with the care management team. I reached out to Lauretta Grill by phone today to assist with re-scheduling a follow up visit with the RN Case Manager  Follow up plan: Telephone appointment with care management team member scheduled for: 08/07/2023  Burman Nieves, [facility] Care Coordination Care Guide Direct Dial: [phone]

## 2023-08-07 ENCOUNTER — Ambulatory Visit: Payer: Self-pay

## 2023-08-07 ENCOUNTER — Telehealth: Payer: Self-pay

## 2023-08-07 NOTE — Patient Outreach (Signed)
  Care Coordination   08/07/2023 Name: Jamie Finley MRN: [id number] DOB: 1961-02-15   Care Coordination Outreach Attempts:  An unsuccessful telephone outreach was attempted for a scheduled appointment today. HIPAA compliant message left with return call phone number.   Follow Up Plan:  Additional outreach attempts will be made to offer the patient care coordination information and services.   Encounter Outcome:  No Answer   Care Coordination Interventions:  No, not indicated    George Ina RN,BSN,CCM [facility]  [facility] coordinator / Case Manager Phone: [phone]

## 2023-08-08 NOTE — Patient Instructions (Signed)
Visit Information  Thank you for taking time to visit with me today. Please don't hesitate to contact me if I can be of assistance to you.   Following are the goals we discussed today:   Goals Addressed             This Visit's Progress    management and education of chronic health conditions.       Interventions Today    Flowsheet Row Most Recent Value  Chronic Disease   Chronic disease during today's visit Diabetes, Other  [immobility/ falls]  General Interventions   General Interventions Discussed/Reviewed General Interventions Discussed, Doctor Visits, Labs  [evaluation of current treatment plan for diabetes/ falls and patients adherence to plan as established by provider. Assessed BS readings and for falls.]  Labs Hgb A1c every 6 months  Doctor Visits Discussed/Reviewed Doctor Visits Discussed  [reviewed upcoming provider visits. Advised to keep follow up appointments as scheduled.]  Education Interventions   Education Provided Provided Education, Provided Printed Education  [Discussed diabetes management. Sent patient diabetes and fall prevention education article through MyChart.]  Provided Verbal Education On When to see the doctor, Blood Sugar Monitoring  [Discussed hypoglycemia/ hyperglycemia management.]  Nutrition Interventions   Nutrition Discussed/Reviewed Nutrition Discussed, Decreasing sugar intake, Carbohydrate meal planning  Pharmacy Interventions   Pharmacy Dicussed/Reviewed Pharmacy Topics Discussed  [medications reviewed. Advised to take medications as prescribed.]              Our next appointment is by telephone on 09/10/23 at 10:30 am  Please call the care guide team at [phone] if you need to cancel or reschedule your appointment.   If you are experiencing a Mental Health or Behavioral Health Crisis or need someone to talk to, please call the Suicide and Crisis Lifeline: 988 call 1-800-273-TALK (toll free, 24 hour hotline)  Patient verbalizes  understanding of instructions and care plan provided today and agrees to view in MyChart. Active MyChart status and patient understanding of how to access instructions and care plan via MyChart confirmed with patient.     George Ina RN,BSN,CCM [place]  [facility], [facility] coordinator / Case Manager Phone: [phone]

## 2023-08-08 NOTE — Patient Outreach (Signed)
  Care Coordination   Initial Visit Note   08/08/2023 Late entry for 08/07/23 Name: Jamie Finley MRN: [id number] DOB: 11/19/1960  Jamie Finley is a 62 y.o. year old male who sees Excell Seltzer, MD for primary care. I spoke with  Lauretta Grill by phone today.  What matters to the patients health and wellness today?  Patient verbalized agreement to ongoing follow up with RNCM. He states his most recent Hgb A1c was 10.6.  Patient states he has previously taking Ozempic and Rybelsus however he had to discontinue due to GI issues.  Patient states he is currently taking metformin. He reports having a recent blood sugar of 395 a few days ago.  He states he noticed he was feeling sluggish, more thirsty and urinating more.  Patient states he increase his water intake and made sure he adjusted his diet to not eating any sugar or carbohydrates.  Patient states his blood sugar today was 245.   Patient reports his blood sugar have ranged from 130's- 300's.  Patient states due to a previous back surgery in 2019 he can only walk very short distances with a can and  uses a wheelchair as his primary ambulatory source.  He states he would be considered a high risk for falls due to issues with his balance.    Goals Addressed             This Visit's Progress    management and education of chronic health conditions.       Interventions Today    Flowsheet Row Most Recent Value  Chronic Disease   Chronic disease during today's visit Diabetes, Other  [immobility/ falls]  General Interventions   General Interventions Discussed/Reviewed General Interventions Discussed, Doctor Visits, Labs  [evaluation of current treatment plan for diabetes/ falls and patients adherence to plan as established by provider. Assessed BS readings and for falls.]  Labs Hgb A1c every 6 months  Doctor Visits Discussed/Reviewed Doctor Visits Discussed  [reviewed upcoming provider visits. Advised to keep follow up appointments  as scheduled.]  Education Interventions   Education Provided Provided Education, Provided Printed Education  [Discussed diabetes management. Sent patient diabetes and fall prevention education article through MyChart.]  Provided Verbal Education On When to see the doctor, Blood Sugar Monitoring  [Discussed hypoglycemia/ hyperglycemia management.]  Nutrition Interventions   Nutrition Discussed/Reviewed Nutrition Discussed, Decreasing sugar intake, Carbohydrate meal planning  Pharmacy Interventions   Pharmacy Dicussed/Reviewed Pharmacy Topics Discussed  [medications reviewed. Advised to take medications as prescribed.]              SDOH assessments and interventions completed:  Yes  SDOH Interventions Today    Flowsheet Row Most Recent Value  SDOH Interventions   Food Insecurity Interventions Intervention Not Indicated  Housing Interventions Intervention Not Indicated  Transportation Interventions Intervention Not Indicated  Utilities Interventions Intervention Not Indicated        Care Coordination Interventions:  Yes, provided   Follow up plan: Follow up call scheduled for 09/10/23    Encounter Outcome:  Patient Visit Completed   George Ina RN,BSN,CCM [facility]  [facility], [facility] coordinator / Case Manager Phone: [phone]

## 2023-08-20 ENCOUNTER — Encounter: Payer: Self-pay | Admitting: Family Medicine

## 2023-08-21 NOTE — Progress Notes (Unsigned)
    Jaquala Fuller T. Yekaterina Escutia, MD, CAQ Sports Medicine [facility] [address] [place], [address]  Phone: [phone]  FAX: [phone]  Waller Monte - 62 y.o. male  MRN [id number]  Date of Birth: Sep 13, 1960  Date: 08/22/2023  PCP: Excell Seltzer, MD  Referral: Excell Seltzer, MD  No chief complaint on file.  Subjective:   Dewaun Gillion is a 62 y.o. very pleasant male patient with There is no height or weight on file to calculate BMI. who presents with the following:  The patient is a very pleasant 62 year old gentleman with a history of stroke, diabetes, cauda equina syndrome, and he presents today with some evaluation of ongoing right sided arm, shoulder, and neck pain.    Review of Systems is noted in the HPI, as appropriate  Objective:   There were no vitals taken for this visit.  GEN: No acute distress; alert,appropriate. PULM: Breathing comfortably in no respiratory distress PSYCH: Normally interactive.   Laboratory and Imaging Data:  Assessment and Plan:   ***

## 2023-08-22 ENCOUNTER — Encounter: Payer: Self-pay | Admitting: Family Medicine

## 2023-08-22 ENCOUNTER — Ambulatory Visit: Payer: Medicare HMO | Admitting: Family Medicine

## 2023-08-22 ENCOUNTER — Ambulatory Visit
Admission: RE | Admit: 2023-08-22 | Discharge: 2023-08-22 | Disposition: A | Discharge status: Home / Self Care | Payer: Medicare HMO | Source: Ambulatory Visit | Attending: Family Medicine | Admitting: Family Medicine

## 2023-08-22 VITALS — BP 110/60 | HR 85 | Temp 97.6°F | Ht 74.0 in | Wt 309.0 lb

## 2023-08-22 DIAGNOSIS — M25511 Pain in right shoulder: Secondary | ICD-10-CM

## 2023-08-22 DIAGNOSIS — M19011 Primary osteoarthritis, right shoulder: Secondary | ICD-10-CM | POA: Diagnosis not present

## 2023-08-22 DIAGNOSIS — Z7984 Long term (current) use of oral hypoglycemic drugs: Secondary | ICD-10-CM

## 2023-08-22 DIAGNOSIS — M7501 Adhesive capsulitis of right shoulder: Secondary | ICD-10-CM | POA: Diagnosis not present

## 2023-08-22 DIAGNOSIS — E1149 Type 2 diabetes mellitus with other diabetic neurological complication: Secondary | ICD-10-CM | POA: Diagnosis not present

## 2023-08-30 ENCOUNTER — Telehealth: Payer: Self-pay

## 2023-08-30 ENCOUNTER — Telehealth: Payer: Self-pay | Admitting: *Deleted

## 2023-08-30 ENCOUNTER — Ambulatory Visit: Payer: Medicare HMO

## 2023-08-30 ENCOUNTER — Other Ambulatory Visit: Payer: Self-pay

## 2023-08-30 VITALS — Ht 74.0 in | Wt 309.0 lb

## 2023-08-30 DIAGNOSIS — E1149 Type 2 diabetes mellitus with other diabetic neurological complication: Secondary | ICD-10-CM | POA: Diagnosis not present

## 2023-08-30 DIAGNOSIS — Z5941 Food insecurity: Secondary | ICD-10-CM

## 2023-08-30 DIAGNOSIS — Z1211 Encounter for screening for malignant neoplasm of colon: Secondary | ICD-10-CM

## 2023-08-30 DIAGNOSIS — I152 Hypertension secondary to endocrine disorders: Secondary | ICD-10-CM | POA: Diagnosis not present

## 2023-08-30 DIAGNOSIS — Z0000 Encounter for general adult medical examination without abnormal findings: Secondary | ICD-10-CM | POA: Diagnosis not present

## 2023-08-30 DIAGNOSIS — E1159 Type 2 diabetes mellitus with other circulatory complications: Secondary | ICD-10-CM | POA: Diagnosis not present

## 2023-08-30 DIAGNOSIS — Z860100 Personal history of colon polyps, unspecified: Secondary | ICD-10-CM

## 2023-08-30 MED ORDER — PEG 3350-KCL-NA BICARB-NACL 420 G PO SOLR: 4000.0000 mL | Freq: Once | ORAL | 0 refills | Status: AC

## 2023-08-30 NOTE — Patient Instructions (Signed)
Jamie Finley , Thank you for taking time to come for your Medicare Wellness Visit. I appreciate your ongoing commitment to your health goals. Please review the following plan we discussed and let me know if I can assist you in the future.   Referrals/Orders/Follow-Ups/Clinician Recommendations:   A referral for colonoscopy has been placed to your requested provider. Please call if you do not hear from them in 7 days. [email or web address] Wyline Mood, MD [address], [place], [place] [address]  [phone] mi [phone]   This is a list of the screening recommended for you and due dates:  Health Maintenance  Topic Date Due   Yearly kidney health urinalysis for diabetes  05/12/2023   COVID-19 Vaccine (3 - 2024-25 season) 09/15/2023*   Zoster (Shingles) Vaccine (1 of 2) 11/28/2023*   Flu Shot  12/10/2023*   DTaP/Tdap/Td vaccine (1 - Tdap) 08/29/2024*   Colon Cancer Screening  08/29/2024*   Hemoglobin A1C  11/26/2023   Yearly kidney function blood test for diabetes  02/13/2024   Eye exam for diabetics  03/05/2024   Complete foot exam   06/24/2024   Medicare Annual Wellness Visit  08/29/2024   Hepatitis C Screening  Completed   HIV Screening  Completed   HPV Vaccine  Aged Out  *Topic was postponed. The date shown is not the original due date.    Advanced directives: (Declined) Advance directive discussed with you today. Even though you declined this today, please call our office should you change your mind, and we can give you the proper paperwork for you to fill out.  Next Medicare Annual Wellness Visit scheduled for next year: Yes 08/29/24 @ 11:30am televisit

## 2023-08-30 NOTE — Progress Notes (Signed)
Subjective:   Jamie Finley is a 62 y.o. male who presents for Medicare Annual/Subsequent preventive examination.  Visit Complete: Virtual I connected with  Lauretta Grill on 08/30/23 by a audio enabled telemedicine application and verified that I am speaking with the correct person using two identifiers.  Patient Location: Home  Provider Location: Home Office  I discussed the limitations of evaluation and management by telemedicine. The patient expressed understanding and agreed to proceed.  Vital Signs: Because this visit was a virtual/telehealth visit, some criteria may be missing or patient reported. Any vitals not documented were not able to be obtained and vitals that have been documented are patient reported.  Patient Medicare AWV questionnaire was completed by the patient on (not done); I have confirmed that all information answered by patient is correct and no changes since this date.  Cardiac Risk Factors include: advanced age (>31men, >22 women);diabetes mellitus;dyslipidemia;hypertension;male gender;obesity (BMI >30kg/m2);sedentary lifestyle     Objective:    Today's Vitals   08/30/23 1130  Weight: (!) 309 lb (140.2 kg)  Height: 6\' 2"  (1.88 m)   Body mass index is 39.67 kg/m.     08/30/2023   11:45 AM 09/08/2022    2:23 PM 08/29/2022    2:56 PM 03/16/2022    1:27 PM 02/11/2022    7:53 PM 07/13/2021    3:53 AM 02/08/2021   12:23 AM  Advanced Directives  Does Patient Have a Medical Advance Directive? No No No No No No No  Would patient like information on creating a medical advance directive?  No - Patient declined No - Patient declined No - Patient declined  No - Patient declined     Current Medications (verified) Outpatient Encounter Medications as of 08/30/2023  Medication Sig   Accu-Chek Softclix Lancets lancets Use to check fasting blood sugar daily   acetaminophen (TYLENOL) 325 MG tablet Take 325 mg by mouth every 4 (four) hours as needed.    ammonium  lactate (AMLACTIN) 12 % lotion Apply 1 Application topically as needed for dry skin.   aspirin 81 MG tablet Take 81 mg by mouth daily.   blood glucose meter kit and supplies Dispense based on patient and insurance preference. Use to check blood sugar two times a day.   Blood Glucose Monitoring Suppl (ACCU-CHEK GUIDE) w/Device KIT Use to check blood fasting blood sugar daily   Cholecalciferol (VITAMIN D3) 50 MCG (2000 UT) TABS Take 5-7 tablets by mouth daily.   colchicine 0.6 MG tablet Take 1 tablet (0.6 mg total) by mouth daily as needed.   fluorometholone (FML) 0.1 % ophthalmic suspension Place 1 drop into both eyes 4 (four) times daily.   glucose blood (ACCU-CHEK GUIDE) test strip Use to check blood fasting blood sugar daily   hydrochlorothiazide (HYDRODIURIL) 25 MG tablet Take 1 tablet (25 mg total) by mouth daily as needed.   metFORMIN (GLUCOPHAGE-XR) 500 MG 24 hr tablet Take 2 tablets (1,000 mg total) by mouth 2 (two) times daily with a meal.   methocarbamol (ROBAXIN) 750 MG tablet Take 1 tablet (750 mg total) by mouth daily as needed for muscle spasms.   Multiple Vitamins-Minerals (ZINC PO) Take 1 tablet by mouth daily.   Plecanatide (TRULANCE) 3 MG TABS Take 1 tablet (3 mg total) by mouth daily.   polyethylene glycol (MIRALAX / GLYCOLAX) 17 g packet Take 17 g by mouth daily.   rosuvastatin (CRESTOR) 20 MG tablet Take 1 tablet (20 mg total) by mouth at bedtime.   RYBELSUS 3  MG TABS Take 1 tablet by mouth once daily   Semaglutide (RYBELSUS) 7 MG TABS Take 1 tablet (7 mg total) by mouth daily.   sildenafil (REVATIO) 20 MG tablet Take 5 tablets prior to sexual activity   No facility-administered encounter medications on file as of 08/30/2023.    Allergies (verified) Patient has no known allergies.   History: Past Medical History:  Diagnosis Date   Arthritis    Depression    Diabetes mellitus without complication (HCC)    Frequent headaches    Gout    Hyperlipidemia     Hypertension    Stroke [facility])    Past Surgical History:  Procedure Laterality Date   BACK SURGERY  2007   [facility]   COLONOSCOPY WITH PROPOFOL N/A 01/27/2020   Procedure: COLONOSCOPY WITH PROPOFOL;  Surgeon: Wyline Mood, MD;  Location: [facility] ENDOSCOPY;  Service: Gastroenterology;  Laterality: N/A;   COLONOSCOPY WITH PROPOFOL N/A 01/28/2020   Procedure: COLONOSCOPY WITH PROPOFOL;  Surgeon: Wyline Mood, MD;  Location: [facility] ENDOSCOPY;  Service: Gastroenterology;  Laterality: N/A;   TONSILLECTOMY     Family History  Problem Relation Age of Onset   Cancer Mother        face and jaw   Cancer Father        kidney   Early death Father    Diabetes Maternal Grandmother    Depression Maternal Grandfather    Diabetes Maternal Grandfather    Hearing loss Maternal Grandfather    Social History   Socioeconomic History   Marital status: Single    Spouse name: Not on file   Number of children: Not on file   Years of education: Not on file   Highest education level: Not on file  Occupational History   Not on file  Tobacco Use   Smoking status: Former    Current packs/day: 0.00    Average packs/day: 1 pack/day for 5.0 years (5.0 ttl pk-yrs)    Types: Cigarettes    Start date: 09/11/1984    Quit date: 09/11/1989    Years since quitting: 33.9   Smokeless tobacco: Never  Vaping Use   Vaping status: Never Used  Substance and Sexual Activity   Alcohol use: Yes    Comment: occasionally   Drug use: No   Sexual activity: Not Currently  Other Topics Concern   Not on file  Social History Narrative   Not on file   Social Drivers of Health   Financial Resource Strain: Low Risk  (09/08/2022)   Overall Financial Resource Strain (CARDIA)    Difficulty of Paying Living Expenses: Not very hard  Recent Concern: Financial Resource Strain - Medium Risk (08/24/2022)   Overall Financial Resource Strain (CARDIA)    Difficulty of Paying Living Expenses: Somewhat hard  Food Insecurity: No Food Insecurity  (08/30/2023)   Hunger Vital Sign    Worried About Running Out of Food in the Last Year: Never true    Ran Out of Food in the Last Year: Never true  Transportation Needs: No Transportation Needs (08/30/2023)   PRAPARE - Administrator, Civil Service (Medical): No    Lack of Transportation (Non-Medical): No  Physical Activity: Inactive (08/30/2023)   Exercise Vital Sign    Days of Exercise per Week: 0 days    Minutes of Exercise per Session: 0 min  Stress: No Stress Concern Present (08/30/2023)   [employer] of Occupational Health - Occupational Stress Questionnaire    Feeling of Stress :  Only a little  Social Connections: Moderately Isolated (08/30/2023)   Social Connection and Isolation Panel [NHANES]    Frequency of Communication with Friends and Family: Three times a week    Frequency of Social Gatherings with Friends and Family: Once a week    Attends Religious Services: More than 4 times per year    Active Member of [employer] or Organizations: No    Attends Engineer, structural: Never    Marital Status: Divorced    Tobacco Counseling Counseling given: Not Answered   Clinical Intake:  Pre-visit preparation completed: No  Pain : No/denies pain   BMI - recorded: 39.67 Nutritional Status: BMI > 30  Obese Nutritional Risks: None Diabetes: Yes CBG done?: No Did pt. bring in CBG monitor from home?: No  How often do you need to have someone help you when you read instructions, pamphlets, or other written materials from your doctor or pharmacy?: 1 - Never  Interpreter Needed?: No  Comments: lives alone Information entered by :: B.Josey Forcier,LPN   Activities of Daily Living    08/30/2023   11:46 AM  In your present state of health, do you have any difficulty performing the following activities:  Hearing? 0  Vision? 1  Walking or climbing stairs? 1  Dressing or bathing? 0  Doing errands, shopping? 1  Preparing Food and eating ? N  Using the  Toilet? N  In the past six months, have you accidently leaked urine? N  Do you have problems with loss of bowel control? N  Managing your Medications? N  Managing your Finances? N  Housekeeping or managing your Housekeeping? Y    Patient Care Team: Excell Seltzer, MD as PCP - General (Family Medicine) Kathyrn Sheriff, [facility] (Inactive) as Pharmacist (Pharmacist) Otho Ket, RN as [facility], [place], [place] (Optometry)  Indicate any recent Medical Services you may have received from other than [facility] providers in the past year (date may be approximate).     Assessment:   This is a routine wellness examination for [place].  Hearing/Vision screen Hearing Screening - Comments:: Pt says his hearing is fine Vision Screening - Comments:: Pt says he wears glasses for reading Dr Kirtland Bouchard Larence Penning   Goals Addressed             This Visit's Progress    Care management activities   Not on track    Care Coordination Interventions: Return call from [employer], Casilda Carls , Ship broker who states that she did not receive the referral for patient for [employer] Referral for [employer] re-submitted Patient placed on waiting list and will be contacted by the social worker to complete the in home assessment once his name comes up on the list     CCM (DIABETES) EXPECTED OUTCOME:  MONITOR, SELF-MANAGE AND REDUCE SYMPTOMS OF DIABETES   On track    Current Barriers:  Knowledge Deficits related to Diabetes management Chronic Disease Management support and education needs related to Diabetes, diet No Advanced Directives in place- pt declines information Patient reports he lives alone, has pastor from his church he can call on if needed, states overall independent with ADL, IADL's, continues to drive, has difficulty sometimes standing up too long for cooking and washing dishes, has WC, ramp, cane walker and always uses DME when ambulating, states continues to get out and  be active. Patient reports he has been checking CBG fasting once daily with readings 135-140, reports  stopped taking ozempic 03/12/23 due to GI issues, did not let primary care provider know, pt states he does not always follow a special diet, was drinking sugary drinks, recently switched to diet drinks. Pt reports he has lost 16 pounds while taking ozempic and would like to lose more, pt states he is not sure if he has lost more weight. Patient pleased with decrease in AIC down to 7.6 on 11/17/22 and 7.4 on 02/13/23. PHQ9=5, pt states he saw counselor this year but no longer goes, social work/ counseling services offered, pt declined. Patient reports 3 falls this past year with no injury  Planned Interventions: Reviewed medications with patient and discussed importance of medication adherence;        Reviewed prescribed diet with patient carbohydrate modified; Counseled on importance of regular laboratory monitoring as prescribed;        Advised patient, providing education and rationale, to check cbg per MD order  and record        call provider for findings outside established parameters;       Review of patient status, including review of consultants reports, relevant laboratory and other test results, and medications completed;       Advised patient to discuss any issues with blood sugar, medications/ side effects with provider;      Safety precautions reinforced Reviewed importance of exercise and continuing at [facility] / swimming In basket message sent to primary care provider reporting pt has not taken ozempic since 03/12/23 due to GI issues (constipation etc)  Symptom Management: Take medications as prescribed   Attend all scheduled provider appointments Call pharmacy for medication refills 3-7 days in advance of running out of medications Attend church or other social activities Perform all self care activities independently  Perform IADL's (shopping, preparing meals, housekeeping, managing  finances) independently Call provider office for new concerns or questions  check blood sugar at prescribed times: per doctor's order  check feet daily for cuts, sores or redness enter blood sugar readings and medication or insulin into daily log take the blood sugar log to all doctor visits take the blood sugar meter to all doctor visits set goal weight trim toenails straight across drink 6 to 8 glasses of water each day fill half of plate with vegetables limit fast food meals to no more than 1 per week manage portion size prepare main meal at home 3 to 5 days each week read food labels for fat, fiber, carbohydrates and portion size keep feet up while sitting wash and dry feet carefully every day wear comfortable, cotton socks Continue to work towards your goal of weight loss, consider getting a digital scale Congratulations on AIC of 7.4- keep up the good work! fall prevention strategies: change position slowly, use assistive device such as walker or cane (per provider recommendations) when walking, keep walkways clear, have good lighting in room. It is important to contact your provider if you have any falls, maintain muscle strength/tone by exercise per provider recommendations. Your doctor was notified that you are not taking ozempic  Follow Up Plan: Telephone follow up appointment with care management team member scheduled for:  06/07/23 at 215 pm       CCM (HYPERTENSION) EXPECTED OUTCOME: MONITOR, SELF-MANAGE AND REDUCE SYMPTOMS OF HYPERTENSION   On track    Current Barriers:  Knowledge Deficits related to Hypertension management Chronic Disease Management support and education needs related to Hypertension, diet No Advanced Directives in place- pt declines Patient reports he has a wrist  cuff but does not monitor blood pressure at home, states " it's always usually good" Patient reports he swims at [facility] at least once per week Patient reports he received brace from [facility]  (formerly [employer]), pt wears brace for left ankle and states " this is working well"  Planned Interventions: Evaluation of current treatment plan related to hypertension self management and patient's adherence to plan as established by provider;   Reviewed medications with patient and discussed importance of compliance;  Counseled on the importance of exercise goals with target of 150 minutes per week Advised patient, providing education and rationale, to monitor blood pressure daily and record, calling PCP for findings outside established parameters;  Advised patient to discuss any issues with blood pressure, medications with provider; Discussed complications of poorly controlled blood pressure such as heart disease, stroke, circulatory complications, vision complications, kidney impairment, sexual dysfunction;  Reinforced importance of adherence to low sodium diet  Symptom Management: Take medications as prescribed   Attend all scheduled provider appointments Call pharmacy for medication refills 3-7 days in advance of running out of medications Attend church or other social activities Perform all self care activities independently  Perform IADL's (shopping, preparing meals, housekeeping, managing finances) independently Call provider office for new concerns or questions  check blood pressure weekly choose a place to take my blood pressure (home, clinic or office, retail store) write blood pressure results in a log or diary learn about high blood pressure keep a blood pressure log take blood pressure log to all doctor appointments keep all doctor appointments take medications for blood pressure exactly as prescribed eat more whole grains, fruits and vegetables, lean meats and healthy fats Follow low sodium diet- limit fast food Read labels for sodium content Continue swimming at [facility], any exercise is helpful  Follow Up Plan: Telephone follow up appointment with care management team  member scheduled for:   06/07/23 at 215 pm       DIET - EAT MORE FRUITS AND VEGETABLES   Not on track    management and education of chronic health conditions.   On track    Interventions Today    Flowsheet Row Most Recent Value  Chronic Disease   Chronic disease during today's visit Diabetes, Other  [immobility/ falls]  General Interventions   General Interventions Discussed/Reviewed General Interventions Discussed, Doctor Visits, Labs  [evaluation of current treatment plan for diabetes/ falls and patients adherence to plan as established by provider. Assessed BS readings and for falls.]  Labs Hgb A1c every 6 months  Doctor Visits Discussed/Reviewed Doctor Visits Discussed  [reviewed upcoming provider visits. Advised to keep follow up appointments as scheduled.]  Education Interventions   Education Provided Provided Education, Provided Printed Education  [Discussed diabetes management. Sent patient diabetes and fall prevention education article through MyChart.]  Provided Verbal Education On When to see the doctor, Blood Sugar Monitoring  [Discussed hypoglycemia/ hyperglycemia management.]  Nutrition Interventions   Nutrition Discussed/Reviewed Nutrition Discussed, Decreasing sugar intake, Carbohydrate meal planning  Pharmacy Interventions   Pharmacy Dicussed/Reviewed Pharmacy Topics Discussed  [medications reviewed. Advised to take medications as prescribed.]             Depression Screen    08/30/2023   11:40 AM 05/29/2023    3:04 PM 02/20/2023   11:37 AM 10/03/2022   10:47 AM 09/12/2022    3:15 PM 09/08/2022    2:12 PM 08/29/2022    2:54 PM  PHQ 2/9 Scores  PHQ - 2 Score 0 1  2 2 0 2 1  PHQ- 9 Score  8 12 4  5 4     Fall Risk    08/30/2023   11:36 AM 10/03/2022   10:46 AM 09/08/2022    2:20 PM 08/29/2022    2:57 PM 04/17/2022    1:17 PM  Fall Risk   Falls in the past year? 0 1 1 0 1  Number falls in past yr: 0 1 1 1 1   Injury with Fall? 0 0 0 0 0  Risk for fall due to  : No Fall Risks   No Fall Risks;History of fall(s) History of fall(s);Impaired balance/gait;Impaired mobility  Follow up Falls prevention discussed;Education provided   Falls prevention discussed;Falls evaluation completed     MEDICARE RISK AT HOME: Medicare Risk at Home Any stairs in or around the home?: Yes (has ramp) If so, are there any without handrails?: Yes Home free of loose throw rugs in walkways, pet beds, electrical cords, etc?: Yes Adequate lighting in your home to reduce risk of falls?: Yes Life alert?: No Use of a cane, walker or w/c?: Yes Grab bars in the bathroom?: No Shower chair or bench in shower?: No Elevated toilet seat or a handicapped toilet?: No  TIMED UP AND GO:  Was the test performed?  No    Cognitive Function:        08/30/2023   11:48 AM  6CIT Screen  What Year? 0 points  What month? 0 points  What time? 0 points  Count back from 20 0 points  Months in reverse 0 points  Repeat phrase 0 points  Total Score 0 points    Immunizations Immunization History  Administered Date(s) Administered   Moderna Sars-Covid-2 Vaccination 05/03/2020, 06/07/2020    TDAP status: Up to date  Flu Vaccine status: Declined, Education has been provided regarding the importance of this vaccine but patient still declined. Advised may receive this vaccine at local pharmacy or Health Dept. Aware to provide a copy of the vaccination record if obtained from local pharmacy or Health Dept. Verbalized acceptance and understanding.  Pneumococcal vaccine status: Due, Education has been provided regarding the importance of this vaccine. Advised may receive this vaccine at local pharmacy or Health Dept. Aware to provide a copy of the vaccination record if obtained from local pharmacy or Health Dept. Verbalized acceptance and understanding.  Covid-19 vaccine status: Completed vaccines  Qualifies for Shingles Vaccine? Yes   Zostavax completed No   Shingrix Completed?: No.     Education has been provided regarding the importance of this vaccine. Patient has been advised to call insurance company to determine out of pocket expense if they have not yet received this vaccine. Advised may also receive vaccine at local pharmacy or Health Dept. Verbalized acceptance and understanding.  Screening Tests Health Maintenance  Topic Date Due   Diabetic kidney evaluation - Urine ACR  05/12/2023   COVID-19 Vaccine (3 - 2024-25 season) 09/15/2023 (Originally 05/13/2023)   Zoster Vaccines- Shingrix (1 of 2) 11/28/2023 (Originally 11/05/2010)   INFLUENZA VACCINE  12/10/2023 (Originally 04/12/2023)   DTaP/Tdap/Td (1 - Tdap) 08/29/2024 (Originally 11/06/1979)   Colonoscopy  08/29/2024 (Originally 01/28/2023)   HEMOGLOBIN A1C  11/26/2023   Diabetic kidney evaluation - eGFR measurement  02/13/2024   OPHTHALMOLOGY EXAM  03/05/2024   FOOT EXAM  06/24/2024   Medicare Annual Wellness (AWV)  08/29/2024   Hepatitis C Screening  Completed   HIV Screening  Completed   HPV VACCINES  Aged Out  Health Maintenance  Health Maintenance Due  Topic Date Due   Diabetic kidney evaluation - Urine ACR  05/12/2023    Colorectal cancer screening: Referral to GI placed yes. Pt aware the office will call re: appt.  Lung Cancer Screening: (Low Dose CT Chest recommended if Age 33-80 years, 20 pack-year currently smoking OR have quit w/in 15years.) does not qualify.   Lung Cancer Screening Referral: no  Additional Screening:  Hepatitis C Screening: does not qualify; Completed 07/17/17  Vision Screening: Recommended annual ophthalmology exams for early detection of glaucoma and other disorders of the eye. Is the patient up to date with their annual eye exam?  Yes  Who is the provider or what is the name of the office in which the patient attends annual eye exams? Dr Larence Penning If pt is not established with a provider, would they like to be referred to a provider to establish care? No .   Dental Screening:  Recommended annual dental exams for proper oral hygiene  Diabetic Foot Exam: Diabetic Foot Exam: Completed 06/25/23  Community Resource Referral / Chronic Care Management: CRR required this visit?  Yes   CCM required this visit?  PCP informed of CCM need APPT made w PCP for f/u    Plan:     I have personally reviewed and noted the following in the patient's chart:   Medical and social history Use of alcohol, tobacco or illicit drugs  Current medications and supplements including opioid prescriptions. Patient is not currently taking opioid prescriptions. Functional ability and status Nutritional status Physical activity Advanced directives List of other physicians Hospitalizations, surgeries, and ER visits in previous 12 months Vitals Screenings to include cognitive, depression, and falls Referrals and appointments  In addition, I have reviewed and discussed with patient certain preventive protocols, quality metrics, and best practice recommendations. A written personalized care plan for preventive services as well as general preventive health recommendations were provided to patient.    Sue Lush, LPN   [id number]   After Visit Summary: (MyChart) Due to this being a telephonic visit, the after visit summary with patients personalized plan was offered to patient via MyChart   Nurse Notes: Pt says he has not taken BS in 2 weeks as [employer] is out of the strips he uses (the cheapest). He relays his BS was high last check in the 300's. He relays he eats very poorly due to lack of funds and not able to cook food at home. Pt rrelays he uses a w/c mostly and a cane scarcely. He lives alone and has little support. He relays he goes to church every Sunday (his only interaction with ppl). Pt had referral last year for meals on wheels in which he says he never received. Pt needs CCM referral to help manage and monitor his BS and BP.  *

## 2023-08-30 NOTE — Addendum Note (Signed)
Addended by: Alvina Chou on: 08/30/2023 12:55 PM   Modules accepted: Orders

## 2023-08-30 NOTE — Progress Notes (Signed)
Complex Care Management Note   08/30/2023 Name: Jamie Finley MRN: [id number] DOB: 09-02-1961  Jamie Finley is a 62 y.o. year old male who sees Excell Seltzer, MD for primary care. I reached out to Lauretta Grill by phone today to offer complex care management services.  Jamie Finley was given information about Complex Care Management services today including:   The Complex Care Management services include support from the care team which includes your Nurse Coordinator, Clinical Social Worker, or Pharmacist.  The Complex Care Management team is here to help remove barriers to the health concerns and goals most important to you. Complex Care Management services are voluntary, and the patient may decline or stop services at any time by request to their care team member.   Complex Care Management Consent Status: Patient agreed to services and verbal consent obtained.   Follow up plan:  Telephone appointment with complex care management team member scheduled for:  09/06/2023  Encounter Outcome:  Patient Scheduled  Jamie Finley, [facility] Care Coordination Care Guide Direct Dial: [phone]

## 2023-08-30 NOTE — Telephone Encounter (Signed)
Gastroenterology Pre-Procedure Review  Request Date: 10/08/23 Requesting Physician: Dr. Tobi Bastos  PATIENT REVIEW QUESTIONS: The patient responded to the following health history questions as indicated:    1. Are you having any GI issues? no 2. Do you have a personal history of Polyps? yes (Last colonoscopy performed by Dr. Tobi Bastos 01/28/2020) 3. Do you have a family history of Colon Cancer or Polyps? no 4. Diabetes Mellitus? yes (takes metformin has been advised to stop 2 days prior to colonoscopy) 5. Joint replacements in the past 12 months?no 6. Major health problems in the past 3 months?no 7. Any artificial heart valves, MVP, or defibrillator?no    MEDICATIONS & ALLERGIES:    Patient reports the following regarding taking any anticoagulation/antiplatelet therapy:   Plavix, Coumadin, Eliquis, Xarelto, Lovenox, Pradaxa, Brilinta, or Effient? no Aspirin? no  Patient confirms/reports the following medications:  Current Outpatient Medications  Medication Sig Dispense Refill   Accu-Chek Softclix Lancets lancets Use to check fasting blood sugar daily 100 each 3   acetaminophen (TYLENOL) 325 MG tablet Take 325 mg by mouth every 4 (four) hours as needed.      ammonium lactate (AMLACTIN) 12 % lotion Apply 1 Application topically as needed for dry skin. 400 g 0   aspirin 81 MG tablet Take 81 mg by mouth daily.     blood glucose meter kit and supplies Dispense based on patient and insurance preference. Use to check blood sugar two times a day. 1 each 0   Blood Glucose Monitoring Suppl (ACCU-CHEK GUIDE) w/Device KIT Use to check blood fasting blood sugar daily 1 kit 0   Cholecalciferol (VITAMIN D3) 50 MCG (2000 UT) TABS Take 5-7 tablets by mouth daily.     colchicine 0.6 MG tablet Take 1 tablet (0.6 mg total) by mouth daily as needed. 30 tablet 2   fluorometholone (FML) 0.1 % ophthalmic suspension Place 1 drop into both eyes 4 (four) times daily.     glucose blood (ACCU-CHEK GUIDE) test strip Use to  check blood fasting blood sugar daily 100 each 3   hydrochlorothiazide (HYDRODIURIL) 25 MG tablet Take 1 tablet (25 mg total) by mouth daily as needed. 90 tablet 3   metFORMIN (GLUCOPHAGE-XR) 500 MG 24 hr tablet Take 2 tablets (1,000 mg total) by mouth 2 (two) times daily with a meal. 360 tablet 3   methocarbamol (ROBAXIN) 750 MG tablet Take 1 tablet (750 mg total) by mouth daily as needed for muscle spasms. 30 tablet 1   Multiple Vitamins-Minerals (ZINC PO) Take 1 tablet by mouth daily.     Plecanatide (TRULANCE) 3 MG TABS Take 1 tablet (3 mg total) by mouth daily. 90 tablet 3   polyethylene glycol (MIRALAX / GLYCOLAX) 17 g packet Take 17 g by mouth daily.     rosuvastatin (CRESTOR) 20 MG tablet Take 1 tablet (20 mg total) by mouth at bedtime. 90 tablet 3   RYBELSUS 3 MG TABS Take 1 tablet by mouth once daily (Patient not taking: Reported on 08/30/2023) 30 tablet 0   Semaglutide (RYBELSUS) 7 MG TABS Take 1 tablet (7 mg total) by mouth daily. (Patient not taking: Reported on 08/30/2023) 30 tablet 11   sildenafil (REVATIO) 20 MG tablet Take 5 tablets prior to sexual activity 20 tablet 11   No current facility-administered medications for this visit.    Patient confirms/reports the following allergies:  No Known Allergies  No orders of the defined types were placed in this encounter.   AUTHORIZATION INFORMATION Primary Insurance: 1D#: Group #:  Secondary Insurance: 1D#: Group #:  SCHEDULE INFORMATION: Date: 10/08/23 Time: Location: [facility]

## 2023-08-31 ENCOUNTER — Other Ambulatory Visit: Payer: Self-pay | Admitting: Family Medicine

## 2023-08-31 DIAGNOSIS — E1159 Type 2 diabetes mellitus with other circulatory complications: Secondary | ICD-10-CM

## 2023-09-06 ENCOUNTER — Ambulatory Visit: Payer: Self-pay

## 2023-09-06 NOTE — Patient Outreach (Signed)
  Care Coordination   09/06/2023 Name: Jamie Finley MRN: [id number] DOB: [birth year]   Care Coordination Outreach Attempts:  An unsuccessful outreach was attempted for an appointment today.  Follow Up Plan:  Additional outreach attempts will be made to offer the patient complex care management information and services.   Encounter Outcome:  No Answer   Care Coordination Interventions:  No, not indicated    SIG Lysle Morales, BSW Social Worker [phone]

## 2023-09-10 ENCOUNTER — Telehealth: Payer: Self-pay | Admitting: Family Medicine

## 2023-09-10 ENCOUNTER — Ambulatory Visit: Payer: Self-pay

## 2023-09-10 NOTE — Telephone Encounter (Signed)
Copied from CRM [id number]. Topic: General - Other >> Sep 10, 2023 11:17 AM Larwance Sachs wrote: Reason for CRM: Patient called in regarding missing phone call with [employer] for second time, states he receives voicemail's but not phone calls and this is the second time this has happened and he only has one number, patient is concerned as to how he will get connected if calls are not coming through

## 2023-09-10 NOTE — Patient Outreach (Signed)
  Care Coordination   09/10/2023 Name: Jamie Finley MRN: [id number] DOB: 02/20/1961   Care Coordination Outreach Attempts:  A third unsuccessful outreach was attempted today to offer the patient with information about available complex care management services. HIPAA compliant message left with return call phone number.   Follow Up Plan:  Additional outreach attempts will be made to offer the patient complex care management information and services.   Encounter Outcome:  No Answer   Care Coordination Interventions:  No, not indicated    George Ina RN,BSN,CCM [facility]  [facility], [facility] coordinator / Case Manager Phone: [phone]

## 2023-09-11 ENCOUNTER — Telehealth: Payer: Self-pay

## 2023-09-11 NOTE — Patient Outreach (Signed)
  Care Coordination   Follow Up Visit Note   09/11/2023 Name: Jamie Finley MRN: [id number] DOB: Mar 15, 1961  Jamie Finley is a 62 y.o. year old male who sees Jamie Finley BRAVO, MD for primary care. I spoke with  Jamie Finley by phone today.  What matters to the patients health and wellness today?  Patient states he is unsure of his blood sugars because he has been out of his Relion glucometer strips for a couple of weeks. He states the [name] he goes to has been out of strips. This RNCM offered to call another local [facility] pharmacy to see if strips are in stock.  Contacted [facility] on [address] in [place].   Confirmed with pharmacy strip for both Relion glucometer's are in stock. Patient notified of this.  Patient reports having right shoulder pain over the past several weeks. He reports having visit with his primary care provider regarding his shoulder pain.  He states he was told he has,  frozen shoulder. Patient states he is taking ibuprofen  on occasion and uses a cream.    Goals Addressed             This Visit's Progress    management and education of chronic health conditions.       Interventions Today    Flowsheet Row Most Recent Value  Chronic Disease   Chronic disease during today's visit Diabetes, Other  [right shoulder pain]  General Interventions   General Interventions Discussed/Reviewed General Interventions Reviewed, Doctor Visits  [evaluation of treatment plan for diabetes,  right shoulder pain and patients adherence to plan as established by provider.,]  Doctor Visits Discussed/Reviewed Doctor Visits Reviewed  Jamie Finley upcoming provider visits. Advised to keep follow up visits with provider.]  Education Interventions   Education Provided Provided Education  [Discussed how to manage frozen shoulder by: taking pain medication as prescribed, using heat or ice, gental massage, activity modification. Call provider for symptoms of increase thirst, urinating  large amounts, feeling tired, blurred vision, weight loss]  Provided Verbal Education On Blood Sugar Monitoring, When to see the doctor  [Assessed for blood sugar readings. [place] [place] [facility] to determine if Relion strips are in stock.  Returned call to patient to inform him Relion strips are in stock. Advised to notify provider for frequent blood sugars >250.]  Nutrition Interventions   Nutrition Discussed/Reviewed Nutrition Reviewed, Carbohydrate meal planning, Adding fruits and vegetables, Decreasing sugar intake  Pharmacy Interventions   Pharmacy Dicussed/Reviewed Pharmacy Topics Reviewed  [medications reviewed and compliance discussed.   Assessed for medication changes.]              SDOH assessments and interventions completed:  No     Care Coordination Interventions:  No, not indicated   Follow up plan: Follow up call scheduled for 10/01/23    Encounter Outcome:  Patient Visit Completed   Jamie Edmundson RN,BSN,CCM [facility]  [facility], [facility] coordinator / Case Manager Phone: [phone]

## 2023-09-11 NOTE — Patient Instructions (Signed)
 Visit Information  Thank you for taking time to visit with me today. Please don't hesitate to contact me if I can be of assistance to you.   Following are the goals we discussed today:   Goals Addressed             This Visit's Progress    management and education of chronic health conditions.       Interventions Today    Flowsheet Row Most Recent Value  Chronic Disease   Chronic disease during today's visit Diabetes, Other  [right shoulder pain]  General Interventions   General Interventions Discussed/Reviewed General Interventions Reviewed, Doctor Visits  [evaluation of treatment plan for diabetes,  right shoulder pain and patients adherence to plan as established by provider.,]  Doctor Visits Discussed/Reviewed Doctor Visits Reviewed  bethann upcoming provider visits. Advised to keep follow up visits with provider.]  Education Interventions   Education Provided Provided Education  [Discussed how to manage frozen shoulder by: taking pain medication as prescribed, using heat or ice, gental massage, activity modification. Call provider for symptoms of increase thirst, urinating large amounts, feeling tired, blurred vision, weight loss]  Provided Verbal Education On Blood Sugar Monitoring, When to see the doctor  [Assessed for blood sugar readings. [place], [place] [facility] to determine if Relion strips are in stock.  Returned call to patient to inform him Relion strips are in stock. Advised to notify provider for frequent blood sugars >250.]  Nutrition Interventions   Nutrition Discussed/Reviewed Nutrition Reviewed, Carbohydrate meal planning, Adding fruits and vegetables, Decreasing sugar intake  Pharmacy Interventions   Pharmacy Dicussed/Reviewed Pharmacy Topics Reviewed  [medications reviewed and compliance discussed.   Assessed for medication changes.]              Our next appointment is by telephone on 10/01/23 at 2:30 pm  Please call the care guide team at  [phone] if you need to cancel or reschedule your appointment.   If you are experiencing a Mental Health or Behavioral Health Crisis or need someone to talk to, please call the Suicide and Crisis Lifeline: 988 call 1-800-273-TALK (toll free, 24 hour hotline)  Patient verbalizes understanding of instructions and care plan provided today and agrees to view in MyChart. Active MyChart status and patient understanding of how to access instructions and care plan via MyChart confirmed with patient.     Arvin Seip RN,BSN,CCM [place]  [facility], [name] coordinator / Case Manager Phone: [phone]

## 2023-09-25 ENCOUNTER — Other Ambulatory Visit: Payer: Self-pay

## 2023-09-25 ENCOUNTER — Encounter: Payer: Self-pay | Admitting: Gastroenterology

## 2023-09-25 MED ORDER — NA SULFATE-K SULFATE-MG SULF 17.5-3.13-1.6 GM/177ML PO SOLN: 354.0000 mL | Freq: Once | ORAL | 0 refills | Status: AC

## 2023-09-26 MED ORDER — PEG 3350-KCL-NA BICARB-NACL 420 G PO SOLR: 4000.0000 mL | Freq: Once | ORAL | 0 refills | Status: AC

## 2023-09-26 NOTE — Addendum Note (Signed)
 Addended by: Leellen Puller on: 09/26/2023 08:47 AM   Modules accepted: Orders

## 2023-09-28 ENCOUNTER — Ambulatory Visit (INDEPENDENT_AMBULATORY_CARE_PROVIDER_SITE_OTHER): Payer: Medicare HMO | Admitting: Family Medicine

## 2023-09-28 ENCOUNTER — Encounter: Payer: Self-pay | Admitting: Family Medicine

## 2023-09-28 VITALS — BP 100/62 | HR 92 | Temp 98.0°F | Ht 74.0 in | Wt 307.5 lb

## 2023-09-28 DIAGNOSIS — I152 Hypertension secondary to endocrine disorders: Secondary | ICD-10-CM | POA: Diagnosis not present

## 2023-09-28 DIAGNOSIS — E1149 Type 2 diabetes mellitus with other diabetic neurological complication: Secondary | ICD-10-CM

## 2023-09-28 DIAGNOSIS — Z7984 Long term (current) use of oral hypoglycemic drugs: Secondary | ICD-10-CM | POA: Diagnosis not present

## 2023-09-28 DIAGNOSIS — E1159 Type 2 diabetes mellitus with other circulatory complications: Secondary | ICD-10-CM | POA: Diagnosis not present

## 2023-09-28 LAB — MICROALBUMIN / CREATININE URINE RATIO
Creatinine,U: 104.6 mg/dL
Microalb Creat Ratio: 1.1 mg/g (ref 0.0–30.0)
Microalb, Ur: 1.1 mg/dL (ref 0.0–1.9)

## 2023-09-28 LAB — POCT GLYCOSYLATED HEMOGLOBIN (HGB A1C): Hemoglobin A1C: 11.1 % — AB (ref 4.0–5.6)

## 2023-09-28 MED ORDER — EMPAGLIFLOZIN 10 MG PO TABS: 10.0000 mg | ORAL_TABLET | Freq: Every day | ORAL | 11 refills | Status: DC

## 2023-09-28 MED ORDER — GLIPIZIDE ER 10 MG PO TB24: 10.0000 mg | ORAL_TABLET | Freq: Every day | ORAL | 11 refills | Status: DC

## 2023-09-28 NOTE — Assessment & Plan Note (Signed)
Stable, chronic.  Continue current medication.   HCTZ 25 mg p.o. daily as needed swelling

## 2023-09-28 NOTE — Assessment & Plan Note (Signed)
Chronic, worsening control despite aggressive dietary changes.  Exercise limited. On maximum dose metformin. We discussed multiple options for treatment today in office.  He would like to start with exhausting oral options before returning to insulin. We discussed how he will likely need multiple medications orally to get A1c to goal. We will restart glipizide XL 10 mg p.o. daily with meal. I will prescribe Jardiance 10 mg daily to start if covered by insurance if fasting blood sugars not improving with sulfonylurea. We discussed possible side effects of Jardiance and encouraged him to continue staying hydrated given some element of neurogenic bladder with his history of cauda equina, to avoid urinary tract infection and pelvic fungal infection. Reevaluate A1c in 3 months.  He will call with an update on blood sugars in the next few weeks.

## 2023-09-28 NOTE — Progress Notes (Signed)
Patient ID: Jamie Finley, male    DOB: 11/22/1960, 63 y.o.   MRN: [id number]  This visit was conducted in person.  BP 100/62 (BP Location: Right Arm, Patient Position: Sitting, Cuff Size: Large)   Pulse 92   Temp 98 F (36.7 C) (Temporal)   Ht 6\' 2"  (1.88 m)   Wt (!) 307 lb 8 oz (139.5 kg)   SpO2 96%   BMI 39.48 kg/m    CC:  Chief Complaint  Patient presents with   Diabetes    Subjective:   HPI: Jamie Finley is a 63 y.o. male presenting on 09/28/2023 for Diabetes  Diabetes: Worsened control of diabetes since needing to stop GLP-1 given severe constipation. Also did not tolerate Rybelsus Currently only taking metformin 500 mg XR 2 tablets twice daily, max dose Lab Results  Component Value Date   HGBA1C 11.1 (A) 09/28/2023  In the past he has tolerated glipizide, Actos has caused peripheral swelling  Was on insulin in past. Using medications without difficulties: Hypoglycemic episodes: none Hyperglycemic episodes: yes 239-303 Feet problems: no ulcers. Blood Sugars averaging: 200s every day despite cutting out bread, soda and carbs. Drinking more water. eye exam within last year: Urine microalbumin collected today   Wt Readings from Last 3 Encounters:  09/28/23 (!) 307 lb 8 oz (139.5 kg)  08/30/23 (!) 309 lb (140.2 kg)  08/22/23 (!) 309 lb (140.2 kg)   Hypertension:   HCTZ 25 mg p.o. daily as needed swelling BP Readings from Last 3 Encounters:  09/28/23 100/62  08/22/23 110/60  05/29/23 104/68  Using medication without problems or lightheadedness:  Chest pain with exertion: Edema: Short of breath: Average home BPs: Other issues:    Relevant past medical, surgical, family and social history reviewed and updated as indicated. Interim medical history since our last visit reviewed. Allergies and medications reviewed and updated. Outpatient Medications Prior to Visit  Medication Sig Dispense Refill   Accu-Chek Softclix Lancets lancets Use to check  fasting blood sugar daily 100 each 3   acetaminophen (TYLENOL) 325 MG tablet Take 325 mg by mouth every 4 (four) hours as needed.      ammonium lactate (AMLACTIN) 12 % lotion Apply 1 Application topically as needed for dry skin. 400 g 0   aspirin 81 MG tablet Take 81 mg by mouth daily.     blood glucose meter kit and supplies Dispense based on patient and insurance preference. Use to check blood sugar two times a day. 1 each 0   Blood Glucose Monitoring Suppl (ACCU-CHEK GUIDE) w/Device KIT Use to check blood fasting blood sugar daily 1 kit 0   Cholecalciferol (VITAMIN D3) 50 MCG (2000 UT) TABS Take 5-7 tablets by mouth daily.     colchicine 0.6 MG tablet Take 1 tablet (0.6 mg total) by mouth daily as needed. 30 tablet 2   fluorometholone (FML) 0.1 % ophthalmic suspension Place 1 drop into both eyes 4 (four) times daily.     glucose blood (ACCU-CHEK GUIDE) test strip Use to check blood fasting blood sugar daily 100 each 3   hydrochlorothiazide (HYDRODIURIL) 25 MG tablet Take 1 tablet (25 mg total) by mouth daily as needed. 90 tablet 3   metFORMIN (GLUCOPHAGE-XR) 500 MG 24 hr tablet Take 2 tablets (1,000 mg total) by mouth 2 (two) times daily with a meal. 360 tablet 3   methocarbamol (ROBAXIN) 750 MG tablet Take 1 tablet (750 mg total) by mouth daily as needed for muscle spasms. 30 tablet  1   Multiple Vitamins-Minerals (ZINC PO) Take 1 tablet by mouth daily.     Plecanatide (TRULANCE) 3 MG TABS Take 1 tablet (3 mg total) by mouth daily. 90 tablet 3   polyethylene glycol (MIRALAX / GLYCOLAX) 17 g packet Take 17 g by mouth daily.     rosuvastatin (CRESTOR) 20 MG tablet Take 1 tablet (20 mg total) by mouth at bedtime. 90 tablet 3   sildenafil (REVATIO) 20 MG tablet Take 5 tablets prior to sexual activity 20 tablet 11   RYBELSUS 3 MG TABS Take 1 tablet by mouth once daily (Patient not taking: Reported on 08/30/2023) 30 tablet 0   Semaglutide (RYBELSUS) 7 MG TABS Take 1 tablet (7 mg total) by mouth  daily. (Patient not taking: Reported on 08/30/2023) 30 tablet 11   No facility-administered medications prior to visit.     Per HPI unless specifically indicated in ROS section below Review of Systems  Constitutional:  Negative for fatigue and fever.  HENT:  Negative for ear pain.   Eyes:  Negative for pain.  Respiratory:  Negative for cough and shortness of breath.   Cardiovascular:  Negative for chest pain, palpitations and leg swelling.  Gastrointestinal:  Positive for constipation. Negative for abdominal pain.  Genitourinary:  Negative for dysuria.  Musculoskeletal:  Negative for arthralgias.  Skin:  Positive for rash.  Neurological:  Negative for syncope, light-headedness and headaches.  Psychiatric/Behavioral:  Negative for dysphoric mood.    Objective:  BP 100/62 (BP Location: Right Arm, Patient Position: Sitting, Cuff Size: Large)   Pulse 92   Temp 98 F (36.7 C) (Temporal)   Ht 6\' 2"  (1.88 m)   Wt (!) 307 lb 8 oz (139.5 kg)   SpO2 96%   BMI 39.48 kg/m   Wt Readings from Last 3 Encounters:  09/28/23 (!) 307 lb 8 oz (139.5 kg)  08/30/23 (!) 309 lb (140.2 kg)  08/22/23 (!) 309 lb (140.2 kg)      Physical Exam Constitutional:      Appearance: He is well-developed.  HENT:     Head: Normocephalic.     Right Ear: Hearing normal.     Left Ear: Hearing normal.     Nose: Nose normal.  Neck:     Thyroid: No thyroid mass or thyromegaly.     Vascular: No carotid bruit.     Trachea: Trachea normal.  Cardiovascular:     Rate and Rhythm: Normal rate and regular rhythm.     Pulses: Normal pulses.     Heart sounds: Heart sounds not distant. No murmur heard.    No friction rub. No gallop.     Comments: No peripheral edema Pulmonary:     Effort: Pulmonary effort is normal. No respiratory distress.     Breath sounds: Normal breath sounds.  Skin:    General: Skin is warm and dry.     Findings: No lesion or rash.  Psychiatric:        Speech: Speech normal.         Behavior: Behavior normal.        Thought Content: Thought content normal.       Results for orders placed or performed in visit on 09/28/23  POCT glycosylated hemoglobin (Hb A1C)   Collection Time: 09/28/23 12:02 PM  Result Value Ref Range   Hemoglobin A1C 11.1 (A) 4.0 - 5.6 %   HbA1c POC (<> result, manual entry)     HbA1c, POC (prediabetic range)  HbA1c, POC (controlled diabetic range)      Assessment and Plan  Type 2 diabetes mellitus with neurological complications [facility]) Assessment & Plan: Chronic, worsening control despite aggressive dietary changes.  Exercise limited. On maximum dose metformin. We discussed multiple options for treatment today in office.  He would like to start with exhausting oral options before returning to insulin. We discussed how he will likely need multiple medications orally to get A1c to goal. We will restart glipizide XL 10 mg p.o. daily with meal. I will prescribe Jardiance 10 mg daily to start if covered by insurance if fasting blood sugars not improving with sulfonylurea. We discussed possible side effects of Jardiance and encouraged him to continue staying hydrated given some element of neurogenic bladder with his history of cauda equina, to avoid urinary tract infection and pelvic fungal infection. Reevaluate A1c in 3 months.  He will call with an update on blood sugars in the next few weeks.  Orders: -     POCT glycosylated hemoglobin (Hb A1C) -     Microalbumin / creatinine urine ratio  Hypertension associated with diabetes (HCC) Assessment & Plan: Stable, chronic.  Continue current medication.   HCTZ 25 mg p.o. daily as needed swelling   Other orders -     Empagliflozin; Take 1 tablet (10 mg total) by mouth daily before breakfast.  Dispense: 30 tablet; Refill: 11 -     glipiZIDE ER; Take 1 tablet (10 mg total) by mouth daily with breakfast.  Dispense: 30 tablet; Refill: 11    Return in about 3 months (around 12/27/2023) for  diabetes follow up with fasting labs prior.   Kerby Nora, MD

## 2023-09-28 NOTE — Patient Instructions (Addendum)
Start Glucotrol  Xl( glipizide) 10 mg daily with a meal.  We will send in prescription. for Jardiance... once this is completed if fasting blood sugar > 120.. start this as well.

## 2023-10-01 ENCOUNTER — Ambulatory Visit: Payer: Self-pay

## 2023-10-01 NOTE — Patient Instructions (Signed)
Visit Information  Thank you for taking time to visit with me today. Please don't hesitate to contact me if I can be of assistance to you.   Following are the goals we discussed today:   Goals Addressed             This Visit's Progress    management and education of chronic health conditions.       Interventions Today    Flowsheet Row Most Recent Value  Chronic Disease   Chronic disease during today's visit Diabetes, Other  [right shoulder pain]  General Interventions   General Interventions Discussed/Reviewed General Interventions Reviewed, Doctor Visits  [evaluation of current treatment plan for listed health conditions and patients adherence to plan as estalished by provider. Assessed for blood sugar readings and ongoing right shoulder pain.]  Doctor Visits Discussed/Reviewed Doctor Visits Reviewed  Annabell Sabal upcoming provider visits. Advised to keep follow up visits with providers as recommended.]  Education Interventions   Education Provided Provided Education, Provided Printed Education  [education article sent to patient in [place] on diabetes management.]  Provided Verbal Education On Other, Blood Sugar Monitoring  [Discussed monitoring blood sugars daily and recording.]  Nutrition Interventions   Nutrition Discussed/Reviewed Nutrition Reviewed, Carbohydrate meal planning, Decreasing sugar intake, Portion sizes  [Discussed ongoing diabetic dietary management.]  Pharmacy Interventions   Pharmacy Dicussed/Reviewed Pharmacy Topics Reviewed  [medications reviewed and compliance discussed.  Advised to notify provider for any new/ ongoing side effects related to jardiance glypizide.]              Our next appointment is by telephone on 10/26/23 at 2 pm  Please call the care guide team at [phone] if you need to cancel or reschedule your appointment.   If you are experiencing a Mental Health or Behavioral Health Crisis or need someone to talk to, please call the Suicide  and Crisis Lifeline: 988 call 1-800-273-TALK (toll free, 24 hour hotline)  Patient verbalizes understanding of instructions and care plan provided today and agrees to view in MyChart. Active MyChart status and patient understanding of how to access instructions and care plan via MyChart confirmed with patient.     George Ina RN, BSN, CCM [place]  [facility], Population Health Case Manager Phone: [phone]  Diabetes: Self-Care  Diabetes mellitus, or diabetes, is a long-term (chronic) disease. It happens when your body doesn't properly use the sugar, or glucose, that's released from food after you eat. Diabetes may happen if: Your pancreas doesn't make enough of a hormone called insulin. Your body doesn't react like it should to the insulin that it makes. Insulin lets sugar go into the cells in your body. This gives you energy. If you have diabetes, the sugar can't get into the cells. This causes high blood sugar. How to treat and manage diabetes You may need to take insulin or other medicines each day to keep your blood sugar in balance. If you need to take insulin, you'll learn how to give it to yourself as a shot. You may need to change the amount of insulin you take based on the foods you eat. You'll need to check your blood sugar levels with a glucose monitor. The readings will show if you have low or high blood sugar. In general, you should have these blood sugar levels: Before meals: 80-130 mg/dL (7.2-5.3 mmol/L). After meals: below 180 mg/dL (10 mmol/L). Hemoglobin A1c (HbA1c) level: less than 7%. Your health care provider will set treatment goals for you. Keep  all follow-up visits. Your provider will watch you closely to make sure treatment is working. Follow these instructions at home: Diabetes medicines Take your medicines every day as told by your provider. List your medicines here: Name of medicine: ______________________________ Amount (dose):  _______________ Time (a.m./p.m.): _______________ Notes: ___________________________________ Name of medicine: ______________________________ Amount (dose): _______________ Time (a.m./p.m.): _______________ Notes: ___________________________________ Name of medicine: ______________________________ Amount (dose): _______________ Time (a.m./p.m.): _______________ Notes: ___________________________________ Insulin If you use insulin, list the types of insulin you use here: Insulin type: ______________________________ Amount (dose): _______________ Time (a.m./p.m.): _______________Notes: ___________________________________ Insulin type: ______________________________ Amount (dose): _______________ Time (a.m./p.m.): _______________ Notes: ___________________________________ Insulin type: ______________________________ Amount (dose): _______________ Time (a.m./p.m.): _______________ Notes: ___________________________________ Insulin type: ______________________________ Amount (dose): _______________ Time (a.m./p.m.): _______________ Notes: ___________________________________ Insulin type: ______________________________ Amount (dose): _______________ Time (a.m./p.m.): _______________ Notes: ___________________________________ Managing blood sugar  Check your blood sugar levels with a glucose monitor as told by your provider. Write down the times that you check your levels here: Time: _______________ Notes: ___________________________________ Time: _______________ Notes: ___________________________________ Time: _______________ Notes: ___________________________________ Time: _______________ Notes: ___________________________________ Time: _______________ Notes: ___________________________________ Time: _______________ Notes: ___________________________________  Low blood sugar Low blood sugar is when the amount of sugar in your blood is at or below 70 mg/dL (3.9 mmol/L). Symptoms may  include: Feeling: Hungry. Sweaty and clammy. Irritable or easily upset. Dizzy. Sleepy. Having: A fast heartbeat. A headache. A change in your eyesight. Numbness around your mouth, lips, or tongue. Having trouble with: Moving your body, or coordination. Sleeping. Treating low blood sugar If you have low blood sugar, eat or drink something with sugar in it right away. If you can think clearly and swallow safely, follow the 15:15 rule: Take 15 gramsof a fast-acting carbohydrate (carb). Options include: 4 oz (120 mL) of fruit juice. 4 oz (120 mL) of soda (not diet soda). A few pieces of hard candy. Check food labels to see how many pieces to eat. 1 Tbsp (15 mL) of sugar or honey. 4 glucose tablets. 1 tube of glucose gel. Check your blood sugar 15 minutes after you take the carb. If your blood sugar is still at or below 70 mg/dL (3.9 mmol/L), take 15 grams of a carb again. If your blood sugar doesn't go above 70 mg/dL (3.9 mmol/L) after 3 tries, get help right away. After your blood sugar goes back to normal, eat a meal or a snack within 1 hour. Treating very low blood sugar If your blood sugar is less than 54 mg/dL (3 mmol/L), it's an emergency. Get help right away. If you can't eat or drink, you will need to be given glucagon. A family member or friend should learn how to check your blood sugar and give you glucagon. Ask your provider if you should keep a glucagon kit at home. You may also need to be treated in a hospital. Questions to ask your health care provider Should I talk with a diabetes educator? What tools will I need to care for myself at home? What medicines do I need? When should I take them? How often do I need to check my blood sugar levels? What number can I call if I have questions? When is my follow-up visit? Where can I find a support group for people with diabetes? Where to find more information American Diabetes Association (ADA): diabetes.org Association of  Diabetes Care and Education Specialists: diabeteseducator.org Contact a health care provider if: Your blood sugar is at or above 240 mg/dL (16.1 mmol/L) for 2 days in a  row. Elvera Bicker been sick or have had a fever for 2 days or more, and you're not getting better. For more than 6 hours: You can't eat or drink. You feel nauseous. You throw up. You have diarrhea. Get help right away if: Your blood sugar is below 54 mg/dL (3 mmol/L). You get confused. You have trouble thinking clearly. You have trouble breathing. These symptoms may be an emergency. Get help right away. Call 911. Do not wait to see if the symptoms will go away. Do not drive yourself to the hospital. This information is not intended to replace advice given to you by your health care provider. Make sure you discuss any questions you have with your health care provider. Document Revised: 01/12/2023 Document Reviewed: 01/12/2023 Elsevier Patient Education  2024 [employer].

## 2023-10-01 NOTE — Patient Outreach (Signed)
  Care Coordination   Follow Up Visit Note   10/01/2023 Name: Jamie Finley MRN: [id number] DOB: [birth year]  Jamie Finley is a 63 y.o. year old male who sees Excell Seltzer, MD for primary care. I spoke with  Lauretta Grill by phone today.  What matters to the patients health and wellness today?  Patient reports having follow up visit with primary care provider on 09/28/23. He reports current Hgb A1c is 11. Patient states he was prescribed Jardiance and glipizide which he is currently taking as prescribed. He reports blood sugars are ranging from 230-320's.   Patient states he has cut out soda's, candy and bread. Denies exercising at this time. Patient states he is doing exercises for right shoulder.  He states he is using OTC voltaren ointment.     Goals Addressed             This Visit's Progress    management and education of chronic health conditions.       Interventions Today    Flowsheet Row Most Recent Value  Chronic Disease   Chronic disease during today's visit Diabetes, Other  [right shoulder pain]  General Interventions   General Interventions Discussed/Reviewed General Interventions Reviewed, Doctor Visits  [evaluation of current treatment plan for listed health conditions and patients adherence to plan as estalished by provider. Assessed for blood sugar readings and ongoing right shoulder pain.]  Doctor Visits Discussed/Reviewed Doctor Visits Reviewed  Annabell Sabal upcoming provider visits. Advised to keep follow up visits with providers as recommended.]  Education Interventions   Education Provided Provided Education, Provided Printed Education  [education article sent to patient in [facility] on diabetes management.]  Provided Verbal Education On Other, Blood Sugar Monitoring  [Discussed monitoring blood sugars daily and recording.]  Nutrition Interventions   Nutrition Discussed/Reviewed Nutrition Reviewed, Carbohydrate meal planning, Decreasing sugar intake, Portion  sizes  [Discussed ongoing diabetic dietary management.]  Pharmacy Interventions   Pharmacy Dicussed/Reviewed Pharmacy Topics Reviewed  [medications reviewed and compliance discussed.  Advised to notify provider for any new/ ongoing side effects related to jardiance glypizide.]              SDOH assessments and interventions completed:  No     Care Coordination Interventions:  Yes, provided   Follow up plan: Follow up call scheduled for 10/26/23 at 2 pm    Encounter Outcome:  Patient Visit Completed   George Ina RN, BSN, CCM [place]  [facility], Population Health Case Manager Phone: [phone]

## 2023-10-04 ENCOUNTER — Ambulatory Visit (INDEPENDENT_AMBULATORY_CARE_PROVIDER_SITE_OTHER): Payer: Medicare HMO | Admitting: Podiatry

## 2023-10-04 ENCOUNTER — Encounter: Payer: Self-pay | Admitting: Podiatry

## 2023-10-04 VITALS — Ht 74.0 in | Wt 307.5 lb

## 2023-10-04 DIAGNOSIS — M79674 Pain in right toe(s): Secondary | ICD-10-CM | POA: Diagnosis not present

## 2023-10-04 DIAGNOSIS — S90425A Blister (nonthermal), left lesser toe(s), initial encounter: Secondary | ICD-10-CM

## 2023-10-04 DIAGNOSIS — E1149 Type 2 diabetes mellitus with other diabetic neurological complication: Secondary | ICD-10-CM

## 2023-10-04 DIAGNOSIS — B351 Tinea unguium: Secondary | ICD-10-CM

## 2023-10-04 DIAGNOSIS — M79675 Pain in left toe(s): Secondary | ICD-10-CM | POA: Diagnosis not present

## 2023-10-08 ENCOUNTER — Ambulatory Visit
Admission: RE | Admit: 2023-10-08 | Discharge: 2023-10-08 | Disposition: A | Discharge status: Home / Self Care | Payer: Medicare HMO | Attending: Gastroenterology | Admitting: Gastroenterology

## 2023-10-08 ENCOUNTER — Encounter
Admission: RE | Disposition: A | Discharge status: Home / Self Care | Payer: Self-pay | Source: Home / Self Care | Attending: Gastroenterology

## 2023-10-08 ENCOUNTER — Encounter: Payer: Self-pay | Admitting: Gastroenterology

## 2023-10-08 ENCOUNTER — Ambulatory Visit: Payer: Medicare HMO

## 2023-10-08 DIAGNOSIS — Z1211 Encounter for screening for malignant neoplasm of colon: Secondary | ICD-10-CM | POA: Insufficient documentation

## 2023-10-08 DIAGNOSIS — Z860100 Personal history of colon polyps, unspecified: Secondary | ICD-10-CM | POA: Diagnosis not present

## 2023-10-08 DIAGNOSIS — Z538 Procedure and treatment not carried out for other reasons: Secondary | ICD-10-CM | POA: Insufficient documentation

## 2023-10-08 HISTORY — PX: COLONOSCOPY WITH PROPOFOL: SHX5780

## 2023-10-08 SURGERY — COLONOSCOPY WITH PROPOFOL
Anesthesia: General

## 2023-10-08 MED ORDER — PHENYLEPHRINE 80 MCG/ML (10ML) SYRINGE FOR IV PUSH (FOR BLOOD PRESSURE SUPPORT)
PREFILLED_SYRINGE | INTRAVENOUS | Status: AC
  Filled 2023-10-08: qty 10

## 2023-10-08 MED ORDER — SODIUM CHLORIDE 0.9 % IV SOLN: INTRAVENOUS | Status: DC

## 2023-10-08 MED ORDER — PROPOFOL 1000 MG/100ML IV EMUL
INTRAVENOUS | Status: AC
  Filled 2023-10-08: qty 100

## 2023-10-08 NOTE — Progress Notes (Signed)
Patient not clean will need to reschedule

## 2023-10-09 ENCOUNTER — Telehealth: Payer: Self-pay

## 2023-10-09 ENCOUNTER — Other Ambulatory Visit: Payer: Self-pay | Admitting: Gastroenterology

## 2023-10-09 ENCOUNTER — Other Ambulatory Visit: Payer: Self-pay

## 2023-10-09 ENCOUNTER — Encounter: Payer: Self-pay | Admitting: Gastroenterology

## 2023-10-09 DIAGNOSIS — Z860100 Personal history of colon polyps, unspecified: Secondary | ICD-10-CM

## 2023-10-09 NOTE — Telephone Encounter (Signed)
   Reschedule was discussed in depth in office with patient.  Colonoscopy rescheduled due to poor prep using Miralax Gatorade and Nulytely and 2 days clear liquid diet. New procedure date is 12/03/23.  He stated that prior to his colonoscopy he did have some medication changes with his diabetic meds.  Made sure to note stop dates for Jardiance, Glipizide and Metformin.  Prep plan includes 2 1/2 days of Clear liquid diet.  Change of preps to (2) Suflave and (1) Suprep because he did have a Suprep prescription as well.   Day 3 : 03/21 Suprep at 12pm Mix with 16 oz of clear liquids. Repeat at 5pm Mix with 16 oz of clear liquids and drink.  Day 2: 03/22 Suflave at 10am.  Fill container to the fill line with clear liquid.  Repeat at 5pm-Fill container to the fill line with clear liquids and drink.  Day 1: 03/23 Suflave at 5pm.  Fill container to the fill line with clear liquid.  Mix well with 16 oz of clear liquids.  Drink.  Repeat again 5 hours prior to the time of colonoscopy.  Nothing to eat or drink following the last prep.

## 2023-10-12 NOTE — Progress Notes (Signed)
  Subjective:  Patient ID: Jamie Finley, male    DOB: 1961-05-13,  MRN: [id number]  Jamie Finley presents to clinic today for at risk foot care with history of diabetic neuropathy and painful, elongated thickened toenails x 10 which are symptomatic when wearing enclosed shoe gear. This interferes with his/her daily activities.  Chief Complaint  Patient presents with   Nail Problem    Pt is here for [facility] lat A1C was 11.6 PCP is Dr Ermalene Searing and LOV was last week.   New problem(s):   Patient unaware of blisters on distal aspect left 2nd and 3rd toes. States he uses heated blanket in bed at night.  PCP is Bedsole, Amy E, MD.  No Known Allergies  Review of Systems: Negative except as noted in the HPI.  Objective: No changes noted in today's physical examination. There were no vitals filed for this visit. Jamie Finley is a pleasant 63 y.o. male morbidly obese in NAD. AAO x 3.  Vascular Examination: Capillary refill time immediate b/l. Vascular status intact b/l with palpable pedal pulses. Pedal hair present b/l. No pain with calf compression b/l. Skin temperature gradient WNL b/l. No cyanosis or clubbing b/l. No ischemia or gangrene noted b/l.   Neurological Examination: Protective sensation diminished with 10g monofilament b/l.  Dermatological Examination: Pedal skin with normal turgor, texture and tone b/l. Intact fluid filled blisters noted distal tip of left 2nd and 3rd digits. No surrounding erythema, no edema, no ischemia, no drainage. No interdigital macerations.   Toenails 1-5 b/l thick, discolored, elongated with subungual debris and pain on dorsal palpation.   No hyperkeratotic nor porokeratotic lesions present on today's visit.  Musculoskeletal Examination: Muscle strength 5/5 to LLE. Utilizes motorized chair for mobility assistance.  Radiographs: None Assessment/Plan: 1. Pain due to onychomycosis of toenails of both feet   2. Blister of toe of left foot, initial  encounter   3. Type 2 diabetes mellitus with neurological complications [facility])     -Patient was evaluated today. All questions/concerns addressed on today's visit. -Educated patient on dangers of using heating pads and electric blankets in presence of diabetes with neuropathy. He related understanding. Patient was treated by Dr. Nicholes Rough for blisters of digits of left foot and will follow up with him I will see him in 3 months for at risk diabetic foot care. -Patient to continue soft, supportive shoe gear daily. -Toenails 1-5 b/l were debrided in length and girth with sterile nail nippers and dremel without iatrogenic bleeding.  -Patient/POA to call should there be question/concern in the interim.   Return in about 3 months (around 01/02/2024).  Freddie Breech, DPM      [place] LOCATION: [address], [place] [address]                   Office [phone]   [facility] LOCATION: [address] [place], [place] [address] Office [phone]

## 2023-10-18 ENCOUNTER — Encounter: Payer: Self-pay | Admitting: Family Medicine

## 2023-10-19 ENCOUNTER — Ambulatory Visit: Payer: Medicare HMO | Admitting: Family Medicine

## 2023-10-19 VITALS — BP 100/60 | HR 102 | Temp 97.4°F | Ht 74.0 in | Wt 307.2 lb

## 2023-10-19 DIAGNOSIS — N5082 Scrotal pain: Secondary | ICD-10-CM

## 2023-10-19 LAB — POC URINALSYSI DIPSTICK (AUTOMATED)
Bilirubin, UA: NEGATIVE
Blood, UA: NEGATIVE
Glucose, UA: POSITIVE — AB
Ketones, UA: NEGATIVE
Leukocytes, UA: NEGATIVE
Nitrite, UA: NEGATIVE
Protein, UA: NEGATIVE
Spec Grav, UA: 1.01 (ref 1.010–1.025)
Urobilinogen, UA: 0.2 U/dL
pH, UA: 6 (ref 5.0–8.0)

## 2023-10-19 MED ORDER — ITRACONAZOLE 100 MG PO CAPS: 100.0000 mg | ORAL_CAPSULE | Freq: Two times a day (BID) | ORAL | 0 refills | Status: AC

## 2023-10-19 NOTE — Progress Notes (Signed)
 Patient ID: Jamie Finley, male    DOB: June 23, 1961, 63 y.o.   MRN: [id number]  This visit was conducted in person.  BP 100/60 (BP Location: Right Arm, Patient Position: Sitting, Cuff Size: Large)   Pulse (!) 102   Temp (!) 97.4 F (36.3 C) (Temporal)   Ht 6' 2 (1.88 m)   Wt (!) 307 lb 4 oz (139.4 kg)   SpO2 98%   BMI 39.45 kg/m    CC:  Chief Complaint  Patient presents with   Testicle Pain    Left    Subjective:   HPI: Jamie Finley is a 63 y.o. male presenting on 10/19/2023 for Testicle Pain (Left)   New onset left testicular/scrotal pain   in last week.. posterior.  No mass, no swelling.  Has intermittent severe, electric shock pain... occurring  50  times a day... 2-3 times a hour.  No change in urination, no blood in urine.  No penile discharge, no skin change.  No fever, no abdominal pain.    On jardiance ... started 1/17... FBS much better on this med 99-120.       Sits in chair all day... some times pain is more positional. Relevant past medical, surgical, family and social history reviewed and updated as indicated. Interim medical history since our last visit reviewed. Allergies and medications reviewed and updated. Outpatient Medications Prior to Visit  Medication Sig Dispense Refill   Accu-Chek Softclix Lancets lancets Use to check fasting blood sugar daily 100 each 3   acetaminophen  (TYLENOL ) 325 MG tablet Take 325 mg by mouth every 4 (four) hours as needed.      ammonium lactate  (AMLACTIN) 12 % lotion Apply 1 Application topically as needed for dry skin. 400 g 0   aspirin  81 MG tablet Take 81 mg by mouth daily.     blood glucose meter kit and supplies Dispense based on patient and insurance preference. Use to check blood sugar two times a day. 1 each 0   Blood Glucose Monitoring Suppl (ACCU-CHEK GUIDE) w/Device KIT Use to check blood fasting blood sugar daily 1 kit 0   Cholecalciferol (VITAMIN D3) 50 MCG (2000 UT) TABS Take 5-7 tablets by mouth  daily.     colchicine  0.6 MG tablet Take 1 tablet (0.6 mg total) by mouth daily as needed. (Patient not taking: Reported on 10/26/2023) 30 tablet 2   diclofenac  Sodium (VOLTAREN ) 1 % GEL Apply topically 4 (four) times daily.     empagliflozin  (JARDIANCE ) 10 MG TABS tablet Take 1 tablet (10 mg total) by mouth daily before breakfast. 30 tablet 11   fluorometholone (FML) 0.1 % ophthalmic suspension Place 1 drop into both eyes 4 (four) times daily.     glipiZIDE  (GLUCOTROL  XL) 10 MG 24 hr tablet Take 1 tablet (10 mg total) by mouth daily with breakfast. 30 tablet 11   glucose blood (ACCU-CHEK GUIDE) test strip Use to check blood fasting blood sugar daily 100 each 3   hydrochlorothiazide  (HYDRODIURIL ) 25 MG tablet Take 1 tablet (25 mg total) by mouth daily as needed. 90 tablet 3   metFORMIN  (GLUCOPHAGE -XR) 500 MG 24 hr tablet Take 2 tablets (1,000 mg total) by mouth 2 (two) times daily with a meal. 360 tablet 3   methocarbamol  (ROBAXIN ) 750 MG tablet Take 1 tablet (750 mg total) by mouth daily as needed for muscle spasms. 30 tablet 1   Multiple Vitamins-Minerals (ZINC  PO) Take 1 tablet by mouth daily.     Plecanatide  (TRULANCE ) 3  MG TABS Take 1 tablet by mouth once daily 90 tablet 3   polyethylene glycol (MIRALAX  / GLYCOLAX ) 17 g packet Take 17 g by mouth daily.     rosuvastatin  (CRESTOR ) 20 MG tablet Take 1 tablet (20 mg total) by mouth at bedtime. 90 tablet 3   sildenafil  (REVATIO ) 20 MG tablet Take 5 tablets prior to sexual activity (Patient not taking: Reported on 10/26/2023) 20 tablet 11   No facility-administered medications prior to visit.     Per HPI unless specifically indicated in ROS section below Review of Systems  Constitutional:  Negative for fatigue and fever.  HENT:  Negative for ear pain.   Eyes:  Negative for pain.  Respiratory:  Negative for cough and shortness of breath.   Cardiovascular:  Negative for chest pain, palpitations and leg swelling.  Gastrointestinal:  Negative for  abdominal pain.  Genitourinary:  Negative for dysuria.  Musculoskeletal:  Negative for arthralgias.  Neurological:  Negative for syncope, light-headedness and headaches.  Psychiatric/Behavioral:  Negative for dysphoric mood.    Objective:  BP 100/60 (BP Location: Right Arm, Patient Position: Sitting, Cuff Size: Large)   Pulse (!) 102   Temp (!) 97.4 F (36.3 C) (Temporal)   Ht 6' 2 (1.88 m)   Wt (!) 307 lb 4 oz (139.4 kg)   SpO2 98%   BMI 39.45 kg/m   Wt Readings from Last 3 Encounters:  10/24/23 (!) 313 lb 6 oz (142.1 kg)  10/19/23 (!) 307 lb 4 oz (139.4 kg)  10/04/23 (!) 307 lb 8 oz (139.5 kg)      Physical Exam Exam conducted with a chaperone present.  Constitutional:      Appearance: He is well-developed.  HENT:     Head: Normocephalic.     Right Ear: Hearing normal.     Left Ear: Hearing normal.     Nose: Nose normal.  Neck:     Thyroid : No thyroid  mass or thyromegaly.     Vascular: No carotid bruit.     Trachea: Trachea normal.  Cardiovascular:     Rate and Rhythm: Normal rate and regular rhythm.     Pulses: Normal pulses.     Heart sounds: Heart sounds not distant. No murmur heard.    No friction rub. No gallop.     Comments: No peripheral edema Pulmonary:     Effort: Pulmonary effort is normal. No respiratory distress.     Breath sounds: Normal breath sounds.  Abdominal:     Hernia: There is no hernia in the left inguinal area or right inguinal area.  Genitourinary:    Pubic Area: Rash present.     Penis: Normal and circumcised.      Testes: Normal. Cremasteric reflex is present.        Right: Mass, tenderness, testicular hydrocele or varicocele not present.        Left: Mass or testicular hydrocele not present.     Epididymis:     Right: Normal. Not enlarged. No tenderness.     Left: Normal. Not enlarged. No tenderness.     Comments: Erythema in perineum on posterior scrotum and perineal area.  No abscess noted. Skin:    General: Skin is warm and  dry.     Findings: No rash.  Psychiatric:        Speech: Speech normal.        Behavior: Behavior normal.        Thought Content: Thought content normal.  Results for orders placed or performed in visit on 10/19/23  POCT Urinalysis Dipstick (Automated)   Collection Time: 10/19/23  2:55 PM  Result Value Ref Range   Color, UA Yellow    Clarity, UA Clear    Glucose, UA Positive (A) Negative   Bilirubin, UA Negative    Ketones, UA Negative    Spec Grav, UA 1.010 1.010 - 1.025   Blood, UA Negative    pH, UA 6.0 5.0 - 8.0   Protein, UA Negative Negative   Urobilinogen, UA 0.2 0.2 or 1.0 E.U./dL   Nitrite, UA Negative    Leukocytes, UA Negative Negative    Assessment and Plan  Scrotal pain Assessment & Plan: Acute, assessment of testicles and epididymis within normal range, no evidence of varicocele or hydrocele.  Area of tenderness appears to be over posterior scrotal skin and perineum.  Patient is on Jardiance  so I am concerned about perineal fungal infection.  He will hold Jardiance  and we will treat with oral antifungal regimen.  He will hold his statin medication while on the antifungal.  He will follow-up with update in the next week.  Return and ER precautions provided  Orders: -     POCT Urinalysis Dipstick (Automated)  Other orders -     Itraconazole ; Take 1 capsule (100 mg total) by mouth 2 (two) times daily for 7 days.  Dispense: 14 capsule; Refill: 0    No follow-ups on file.   Greig Ring, MD

## 2023-10-19 NOTE — Patient Instructions (Signed)
 Hold colchicine  and sildenafil  while on antifungal medication.  Hold jardiance  for now... call with update next week.

## 2023-10-21 NOTE — Progress Notes (Deleted)
   Devan Babino T. Craig Wisnewski, MD, CAQ Sports Medicine [facility] at [facility] [address] [place], [address]  Phone: [phone]  FAX: [phone]  Jamie Finley - 63 y.o. male  MRN [id number]  Date of Birth: [birth year]  Date: 10/22/2023  PCP: Excell Seltzer, MD  Referral: Excell Seltzer, MD  No chief complaint on file.  Subjective:   Jamie Finley is a 63 y.o. very pleasant male patient with There is no height or weight on file to calculate BMI. who presents with the following:  Patient is here for follow-up.  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  The last time I saw him, I had him start on Harvard range of motion protocol for frozen shoulder.  At the time, his diabetes was under very poor control and he had a hemoglobin A1c of 11.  Blood sugars appear to be much better, recently started on Jardiance.    Review of Systems is noted in the HPI, as appropriate  Objective:   There were no vitals taken for this visit.  GEN: No acute distress; alert,appropriate. PULM: Breathing comfortably in no respiratory distress PSYCH: Normally interactive.   Laboratory and Imaging Data:  Assessment and Plan:   ***

## 2023-10-22 ENCOUNTER — Ambulatory Visit: Payer: Medicare HMO | Admitting: Family Medicine

## 2023-10-22 DIAGNOSIS — M7501 Adhesive capsulitis of right shoulder: Secondary | ICD-10-CM

## 2023-10-22 DIAGNOSIS — E1149 Type 2 diabetes mellitus with other diabetic neurological complication: Secondary | ICD-10-CM

## 2023-10-23 NOTE — Progress Notes (Unsigned)
   Sydnei Ohaver T. Jaskiran Pata, MD, CAQ Sports Medicine [facility] at [facility] [address] [place], [address]  Phone: [phone]  FAX: [phone]  Jamie Finley - 63 y.o. male  MRN [id number]  Date of Birth: 07/03/1961  Date: 10/24/2023  PCP: Excell Seltzer, MD  Referral: Excell Seltzer, MD  No chief complaint on file.  Subjective:   Jamie Finley is a 63 y.o. very pleasant male patient with There is no height or weight on file to calculate BMI. who presents with the following:  Patient is here for follow-up.  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  The last time I saw him, I had him start on Harvard range of motion protocol for frozen shoulder.  At the time, his diabetes was under very poor control and he had a hemoglobin A1c of 11.  Blood sugars appear to be much better, recently started on Jardiance.    Review of Systems is noted in the HPI, as appropriate  Objective:   There were no vitals taken for this visit.  GEN: No acute distress; alert,appropriate. PULM: Breathing comfortably in no respiratory distress PSYCH: Normally interactive.   Laboratory and Imaging Data:  Assessment and Plan:   ***

## 2023-10-24 ENCOUNTER — Ambulatory Visit (INDEPENDENT_AMBULATORY_CARE_PROVIDER_SITE_OTHER): Payer: Medicare HMO | Admitting: Family Medicine

## 2023-10-24 ENCOUNTER — Encounter: Payer: Self-pay | Admitting: Family Medicine

## 2023-10-24 VITALS — BP 90/60 | HR 70 | Temp 97.7°F | Ht 74.0 in | Wt 313.4 lb

## 2023-10-24 DIAGNOSIS — M7501 Adhesive capsulitis of right shoulder: Secondary | ICD-10-CM | POA: Diagnosis not present

## 2023-10-25 ENCOUNTER — Ambulatory Visit (INDEPENDENT_AMBULATORY_CARE_PROVIDER_SITE_OTHER): Payer: Medicare HMO | Admitting: Podiatry

## 2023-10-25 ENCOUNTER — Encounter: Payer: Self-pay | Admitting: Podiatry

## 2023-10-25 DIAGNOSIS — E1149 Type 2 diabetes mellitus with other diabetic neurological complication: Secondary | ICD-10-CM

## 2023-10-25 DIAGNOSIS — S90425A Blister (nonthermal), left lesser toe(s), initial encounter: Secondary | ICD-10-CM

## 2023-10-25 NOTE — Progress Notes (Signed)
  Subjective:  Patient ID: Jamie Finley, male    DOB: 1960/12/08,  MRN: [id number]  Aladdin Kollmann presents to clinic today for at risk foot care with history of diabetic neuropathy and painful, elongated thickened toenails x 10 which are symptomatic when wearing enclosed shoe gear. This interferes with his/her daily activities.  Chief Complaint  Patient presents with   Wound Check    "As far as I can tell, it's doing just fine."   New problem(s):   Patient unaware of blisters on distal aspect left 2nd and 3rd toes. States he uses heated blanket in bed at night.  PCP is Bedsole, Amy E, MD.  No Known Allergies  Review of Systems: Negative except as noted in the HPI.  Objective: No changes noted in today's physical examination. There were no vitals filed for this visit. Kaizer Dissinger is a pleasant 63 y.o. male morbidly obese in NAD. AAO x 3.  Vascular Examination: Capillary refill time immediate b/l. Vascular status intact b/l with palpable pedal pulses. Pedal hair present b/l. No pain with calf compression b/l. Skin temperature gradient WNL b/l. No cyanosis or clubbing b/l. No ischemia or gangrene noted b/l.   Neurological Examination: Protective sensation diminished with 10g monofilament b/l.  Dermatological Examination: Pedal skin with normal turgor, texture and tone b/l.  Fluid blisters completely epithelialized.  No acute complaints no surrounding erythema, no edema, no ischemia, no drainage. No interdigital macerations.   Toenails 1-5 b/l thick, discolored, elongated with subungual debris and pain on dorsal palpation.   No hyperkeratotic nor porokeratotic lesions present on today's visit.  Musculoskeletal Examination: Muscle strength 5/5 to LLE. Utilizes motorized chair for mobility assistance.  Radiographs: None Assessment/Plan: No diagnosis found.   -Clinically he is completely reepithelialized and only superficial lesions left.  At this time I discussed with  the patient will follow up like a scab.  I discussed with him to continue clinically monitor if it regresses to come see me right away.  At this time he can return to regular shoes without any restrictions. -He will return to see Dr. Donzetta Matters for diabetic footcare

## 2023-10-26 ENCOUNTER — Ambulatory Visit: Payer: Self-pay

## 2023-10-26 NOTE — Patient Instructions (Signed)
Visit Information  Thank you for taking time to visit with me today. Please don't hesitate to contact me if I can be of assistance to you.   Following are the goals we discussed today:   Goals Addressed             This Visit's Progress    management and education of chronic health conditions.       Interventions Today    Flowsheet Row Most Recent Value  Chronic Disease   Chronic disease during today's visit Diabetes, Other  [right shoulder pain]  General Interventions   General Interventions Discussed/Reviewed General Interventions Reviewed, Doctor Visits  [evaluation of current treatment plan for listed health conditions and patients adherence to plan as established by provider. Assessed for BS readings and pain level.]  Doctor Visits Discussed/Reviewed Doctor Visits Reviewed  Annabell Sabal upcoming provider visits. Advised to keep follow up visits with provider as recommended .]  Exercise Interventions   Exercise Discussed/Reviewed Physical Activity  [Advised patient to continue to do provider recommended Harvard ROM exercises for frozen shoulder. Advised to notiy provider for increase pain or decrease in ROM.]  Education Interventions   Education Provided Provided Education  [Discussed hypoglycemic treatment. Advised to notify provider of frequent blood sugars <70 or >250. Offered to provide patient with additional education  information on diabetes.]  Provided Verbal Education On Blood Sugar Monitoring  [Advised to continue monitoring blood sugars daily]  Nutrition Interventions   Nutrition Discussed/Reviewed Carbohydrate meal planning  Pharmacy Interventions   Pharmacy Dicussed/Reviewed Pharmacy Topics Reviewed  [Confirmed patient stopped taking Jardiance as recommended by provider due to being on antifungal medication. Medication list reviewed and compliance discussed.]              Our next appointment is by telephone on 11/29/19 at 2 pm  Please call the care guide team at  [phone] if you need to cancel or reschedule your appointment.   If you are experiencing a Mental Health or Behavioral Health Crisis or need someone to talk to, please call the Suicide and Crisis Lifeline: 988 call 1-800-273-TALK (toll free, 24 hour hotline)  Patient verbalizes understanding of instructions and care plan provided today and agrees to view in MyChart. Active MyChart status and patient understanding of how to access instructions and care plan via MyChart confirmed with patient.     George Ina RN, BSN, CCM [employer], Population Health Case Manager Phone: [phone]

## 2023-10-26 NOTE — Patient Outreach (Signed)
  Care Coordination   Follow Up Visit Note   10/26/2023 Name: Jamie Finley MRN: [id number] DOB: 1960/11/14  Jamie Finley is a 63 y.o. year old male who sees Excell Seltzer, MD for primary care. I spoke with  Lauretta Grill by phone today.  What matters to the patients health and wellness today? Patient states he is doing better.  He reports blood sugars are now ranging from 90-130'.  He reports having one blood sugar this week of 252.  He states he contributes this to something he ate.  Patient states his provider requested he stop taking the Jardiance this week due to  taking an antifungal medication.  He states his last antifungal pill should be in 2-3 days then he will restart Jardiance as advised by his provider.  Patient states he continues to do the ROM exercised with his right shoulder. He reports noticeable increase in ROM since doing the exercises consistently.  He reports having follow up visit for his shoulder with provider on  10/24/23.  Patient declined need for any additional printed diabetic information at this time.    Goals Addressed             This Visit's Progress    management and education of chronic health conditions.       Interventions Today    Flowsheet Row Most Recent Value  Chronic Disease   Chronic disease during today's visit Diabetes, Other  [right shoulder pain]  General Interventions   General Interventions Discussed/Reviewed General Interventions Reviewed, Doctor Visits  [evaluation of current treatment plan for listed health conditions and patients adherence to plan as established by provider. Assessed for BS readings and pain level.]  Doctor Visits Discussed/Reviewed Doctor Visits Reviewed  Annabell Sabal upcoming provider visits. Advised to keep follow up visits with provider as recommended .]  Exercise Interventions   Exercise Discussed/Reviewed Physical Activity  [Advised patient to continue to do provider recommended Harvard ROM exercises for  frozen shoulder. Advised to notiy provider for increase pain or decrease in ROM.]  Education Interventions   Education Provided Provided Education  [Discussed hypoglycemic treatment. Advised to notify provider of frequent blood sugars <70 or >250. Offered to provide patient with additional education  information on diabetes.]  Provided Verbal Education On Blood Sugar Monitoring  [Advised to continue monitoring blood sugars daily]  Nutrition Interventions   Nutrition Discussed/Reviewed Carbohydrate meal planning  Pharmacy Interventions   Pharmacy Dicussed/Reviewed Pharmacy Topics Reviewed  [Confirmed patient stopped taking Jardiance as recommended by provider due to being on antifungal medication. Medication list reviewed and compliance discussed.]              SDOH assessments and interventions completed:  No     Care Coordination Interventions:  Yes, provided   Follow up plan: Follow up call scheduled for 11/30/23 at 2 pm    Encounter Outcome:  Patient Visit Completed   George Ina RN, BSN, CCM [place]  [facility], Population Health Case Manager Phone: [phone]

## 2023-10-31 DIAGNOSIS — N5082 Scrotal pain: Secondary | ICD-10-CM | POA: Insufficient documentation

## 2023-10-31 NOTE — Assessment & Plan Note (Addendum)
Acute, assessment of testicles and epididymis within normal range, no evidence of varicocele or hydrocele.  Area of tenderness appears to be over posterior scrotal skin and perineum.  Patient is on Jardiance so I am concerned about perineal fungal infection.  He will hold Jardiance and we will treat with oral antifungal regimen.  He will hold his statin medication while on the antifungal.  He will follow-up with update in the next week.  Return and ER precautions provided

## 2023-11-30 ENCOUNTER — Telehealth: Payer: Self-pay | Admitting: *Deleted

## 2023-11-30 DIAGNOSIS — E1149 Type 2 diabetes mellitus with other diabetic neurological complication: Secondary | ICD-10-CM

## 2023-11-30 DIAGNOSIS — E1169 Type 2 diabetes mellitus with other specified complication: Secondary | ICD-10-CM

## 2023-11-30 NOTE — Telephone Encounter (Signed)
-----   Message from Alvina Chou sent at 11/30/2023  2:27 PM EDT ----- Regarding: Lab orders for [facility], 4.10.25 Lab orders for a 3 month follow up appt.

## 2023-12-03 ENCOUNTER — Ambulatory Visit
Admission: RE | Admit: 2023-12-03 | Discharge: 2023-12-03 | Disposition: A | Discharge status: Home / Self Care | Payer: Medicare HMO | Attending: Gastroenterology | Admitting: Gastroenterology

## 2023-12-03 ENCOUNTER — Ambulatory Visit

## 2023-12-03 ENCOUNTER — Encounter
Admission: RE | Disposition: A | Discharge status: Home / Self Care | Payer: Self-pay | Source: Home / Self Care | Attending: Gastroenterology

## 2023-12-03 ENCOUNTER — Other Ambulatory Visit: Payer: Self-pay

## 2023-12-03 ENCOUNTER — Encounter: Payer: Self-pay | Admitting: Gastroenterology

## 2023-12-03 DIAGNOSIS — D126 Benign neoplasm of colon, unspecified: Secondary | ICD-10-CM

## 2023-12-03 DIAGNOSIS — Z860100 Personal history of colon polyps, unspecified: Secondary | ICD-10-CM | POA: Diagnosis not present

## 2023-12-03 DIAGNOSIS — Z5941 Food insecurity: Secondary | ICD-10-CM | POA: Insufficient documentation

## 2023-12-03 DIAGNOSIS — I10 Essential (primary) hypertension: Secondary | ICD-10-CM | POA: Diagnosis not present

## 2023-12-03 DIAGNOSIS — Z1211 Encounter for screening for malignant neoplasm of colon: Secondary | ICD-10-CM | POA: Insufficient documentation

## 2023-12-03 DIAGNOSIS — G8220 Paraplegia, unspecified: Secondary | ICD-10-CM | POA: Insufficient documentation

## 2023-12-03 DIAGNOSIS — Z8673 Personal history of transient ischemic attack (TIA), and cerebral infarction without residual deficits: Secondary | ICD-10-CM | POA: Insufficient documentation

## 2023-12-03 DIAGNOSIS — Z7984 Long term (current) use of oral hypoglycemic drugs: Secondary | ICD-10-CM | POA: Diagnosis not present

## 2023-12-03 DIAGNOSIS — Z87891 Personal history of nicotine dependence: Secondary | ICD-10-CM | POA: Diagnosis not present

## 2023-12-03 DIAGNOSIS — D122 Benign neoplasm of ascending colon: Secondary | ICD-10-CM | POA: Insufficient documentation

## 2023-12-03 DIAGNOSIS — Z5986 Financial insecurity: Secondary | ICD-10-CM | POA: Insufficient documentation

## 2023-12-03 DIAGNOSIS — K635 Polyp of colon: Secondary | ICD-10-CM | POA: Diagnosis not present

## 2023-12-03 DIAGNOSIS — E1143 Type 2 diabetes mellitus with diabetic autonomic (poly)neuropathy: Secondary | ICD-10-CM | POA: Insufficient documentation

## 2023-12-03 DIAGNOSIS — Z860101 Personal history of adenomatous and serrated colon polyps: Secondary | ICD-10-CM | POA: Diagnosis present

## 2023-12-03 HISTORY — PX: COLONOSCOPY WITH PROPOFOL: SHX5780

## 2023-12-03 HISTORY — PX: POLYPECTOMY: SHX5525

## 2023-12-03 LAB — GLUCOSE, CAPILLARY: Glucose-Capillary: 175 mg/dL — ABNORMAL HIGH (ref 70–99)

## 2023-12-03 SURGERY — COLONOSCOPY WITH PROPOFOL
Anesthesia: General

## 2023-12-03 MED ORDER — SODIUM CHLORIDE 0.9 % IV SOLN: INTRAVENOUS | Status: DC

## 2023-12-03 MED ORDER — PROPOFOL 500 MG/50ML IV EMUL
INTRAVENOUS | Status: DC | PRN
  Administered 2023-12-03: 100 mg via INTRAVENOUS
  Administered 2023-12-03: 200 ug/kg/min via INTRAVENOUS

## 2023-12-03 MED ORDER — PROPOFOL 10 MG/ML IV BOLUS
INTRAVENOUS | Status: AC
  Filled 2023-12-03: qty 20

## 2023-12-03 MED ORDER — SODIUM CHLORIDE 0.9% FLUSH: 3.0000 mL | Freq: Two times a day (BID) | INTRAVENOUS | Status: DC

## 2023-12-03 MED ORDER — SODIUM CHLORIDE 0.9% FLUSH
3.0000 mL | INTRAVENOUS | Status: DC | PRN
Start: 2023-12-03 — End: 2023-12-03

## 2023-12-03 NOTE — H&P (Signed)
 Wyline Mood, MD [address], [address], [place], [place], [address] [address], [place], [place], [address] Phone: [phone]  Fax: [phone]  Primary Care Physician:  Excell Seltzer, MD   Pre-Procedure History & Physical: HPI:  Jamie Finley is a 63 y.o. male is here for an colonoscopy.   Past Medical History:  Diagnosis Date   Arthritis    Depression    Diabetes mellitus without complication (HCC)    Frequent headaches    Gout    Hyperlipidemia    Hypertension    Stroke [facility])     Past Surgical History:  Procedure Laterality Date   BACK SURGERY  2007   [facility]   COLONOSCOPY WITH PROPOFOL N/A 01/27/2020   Procedure: COLONOSCOPY WITH PROPOFOL;  Surgeon: Wyline Mood, MD;  Location: [facility] ENDOSCOPY;  Service: Gastroenterology;  Laterality: N/A;   COLONOSCOPY WITH PROPOFOL N/A 01/28/2020   Procedure: COLONOSCOPY WITH PROPOFOL;  Surgeon: Wyline Mood, MD;  Location: [facility] ENDOSCOPY;  Service: Gastroenterology;  Laterality: N/A;   COLONOSCOPY WITH PROPOFOL N/A 10/08/2023   Procedure: COLONOSCOPY WITH PROPOFOL;  Surgeon: Wyline Mood, MD;  Location[facility] ENDOSCOPY;  Service: Gastroenterology;  Laterality: N/A;   TONSILLECTOMY      Prior to Admission medications   Medication Sig Start Date End Date Taking? Authorizing Provider  Accu-Chek Softclix Lancets lancets Use to check fasting blood sugar daily 03/01/21   Bedsole, Amy E, MD  acetaminophen (TYLENOL) 325 MG tablet Take 325 mg by mouth every 4 (four) hours as needed.     [provider]  ammonium lactate (AMLACTIN) 12 % lotion Apply 1 Application topically as needed for dry skin. 09/12/22   Candelaria Stagers, DPM  aspirin 81 MG tablet Take 81 mg by mouth daily.    Ellyn Hack, MD  blood glucose meter kit and supplies Dispense based on patient and insurance preference. Use to check blood sugar two times a day. 12/12/18   Bedsole, Amy E, MD  Blood Glucose Monitoring Suppl (ACCU-CHEK GUIDE) w/Device KIT Use to  check blood fasting blood sugar daily 03/01/21   Bedsole, Amy E, MD  Cholecalciferol (VITAMIN D3) 50 MCG (2000 UT) TABS Take 5-7 tablets by mouth daily.    [provider]  colchicine 0.6 MG tablet Take 1 tablet (0.6 mg total) by mouth daily as needed. Patient not taking: Reported on 10/26/2023 08/18/22   Excell Seltzer, MD  diclofenac Sodium (VOLTAREN) 1 % GEL Apply topically 4 (four) times daily.    [provider]  empagliflozin (JARDIANCE) 10 MG TABS tablet Take 1 tablet (10 mg total) by mouth daily before breakfast. 09/28/23   Bedsole, Amy E, MD  fluorometholone (FML) 0.1 % ophthalmic suspension Place 1 drop into both eyes 4 (four) times daily. 10/24/22   [provider]  glipiZIDE (GLUCOTROL XL) 10 MG 24 hr tablet Take 1 tablet (10 mg total) by mouth daily with breakfast. 09/28/23   Bedsole, Amy E, MD  glucose blood (ACCU-CHEK GUIDE) test strip Use to check blood fasting blood sugar daily 03/01/21   Bedsole, Amy E, MD  hydrochlorothiazide (HYDRODIURIL) 25 MG tablet Take 1 tablet (25 mg total) by mouth daily as needed. 06/06/23   Bedsole, Amy E, MD  metFORMIN (GLUCOPHAGE-XR) 500 MG 24 hr tablet Take 2 tablets (1,000 mg total) by mouth 2 (two) times daily with a meal. 06/06/23   Bedsole, Amy E, MD  methocarbamol (ROBAXIN) 750 MG tablet Take 1 tablet (750 mg total) by mouth  daily as needed for muscle spasms. 08/09/21   Bedsole, Amy E, MD  Multiple Vitamins-Minerals (ZINC PO) Take 1 tablet by mouth daily.    [provider]  Plecanatide (TRULANCE) 3 MG TABS Take 1 tablet by mouth once daily 10/09/23   Wyline Mood, MD  polyethylene glycol (MIRALAX / GLYCOLAX) 17 g packet Take 17 g by mouth daily.    [provider]  rosuvastatin (CRESTOR) 20 MG tablet Take 1 tablet (20 mg total) by mouth at bedtime. 06/06/23   Excell Seltzer, MD  sildenafil (REVATIO) 20 MG tablet Take 5 tablets prior to sexual activity Patient not taking: Reported on 10/26/2023 01/23/22   Excell Seltzer, MD    Allergies as of 10/09/2023   (No Known Allergies)    Family History  Problem Relation Age of Onset   Cancer Mother        face and jaw   Cancer Father        kidney   Early death Father    Diabetes Maternal Grandmother    Depression Maternal Grandfather    Diabetes Maternal Grandfather    Hearing loss Maternal Grandfather     Social History   Socioeconomic History   Marital status: Single    Spouse name: Not on file   Number of children: Not on file   Years of education: Not on file   Highest education level: Bachelor's degree (e.g., BA, AB, BS)  Occupational History   Not on file  Tobacco Use   Smoking status: Former    Current packs/day: 0.00    Average packs/day: 1 pack/day for 5.0 years (5.0 ttl pk-yrs)    Types: Cigarettes    Start date: 09/11/1984    Quit date: 09/11/1989    Years since quitting: 34.2   Smokeless tobacco: Never  Vaping Use   Vaping status: Never Used  Substance and Sexual Activity   Alcohol use: Yes    Comment: occasionally   Drug use: No   Sexual activity: Not Currently  Other Topics Concern   Not on file  Social History Narrative   Not on file   Social Drivers of Health   Financial Resource Strain: Medium Risk (10/24/2023)   Overall Financial Resource Strain (CARDIA)    Difficulty of Paying Living Expenses: Somewhat hard  Food Insecurity: Food Insecurity Present (10/24/2023)   Hunger Vital Sign    Worried About Running Out of Food in the Last Year: Often true    Ran Out of Food in the Last Year: Never true  Transportation Needs: No Transportation Needs (10/24/2023)   PRAPARE - Administrator, Civil Service (Medical): No    Lack of Transportation (Non-Medical): No  Physical Activity: Insufficiently Active (10/24/2023)   Exercise Vital Sign    Days of Exercise per Week: 2 days    Minutes of Exercise per Session: 10 min  Stress: Stress Concern Present (10/24/2023)   [employer] of Occupational Health -  Occupational Stress Questionnaire    Feeling of Stress : Very much  Social Connections: Moderately Integrated (10/24/2023)   Social Connection and Isolation Panel [NHANES]    Frequency of Communication with Friends and Family: More than three times a week    Frequency of Social Gatherings with Friends and Family: Once a week    Attends Religious Services: More than 4 times per year    Active Member of [employer] or Organizations: Yes    Attends Banker Meetings: More than 4  times per year    Marital Status: Divorced  Recent Concern: Social Connections - Moderately Isolated (08/30/2023)   Social Connection and Isolation Panel [NHANES]    Frequency of Communication with Friends and Family: Three times a week    Frequency of Social Gatherings with Friends and Family: Once a week    Attends Religious Services: More than 4 times per year    Active Member of [employer] or Organizations: No    Attends Banker Meetings: Never    Marital Status: Divorced  Catering manager Violence: Not At Risk (08/30/2023)   Humiliation, Afraid, Rape, and Kick questionnaire    Fear of Current or Ex-Partner: No    Emotionally Abused: No    Physically Abused: No    Sexually Abused: No    Review of Systems: See HPI, otherwise negative ROS  Physical Exam: There were no vitals taken for this visit. General:   Alert,  pleasant and cooperative in NAD Head:  Normocephalic and atraumatic. Neck:  Supple; no masses or thyromegaly. Lungs:  Clear throughout to auscultation, normal respiratory effort.    Heart:  +S1, +S2, Regular rate and rhythm, No edema. Abdomen:  Soft, nontender and nondistended. Normal bowel sounds, without guarding, and without rebound.   Neurologic:  Alert and  oriented x4;  grossly normal neurologically.  Impression/Plan: Jamie Finley is here for an colonoscopy to be performed for surveillance due to prior history of colon polyps   Risks, benefits, limitations, and  alternatives regarding  colonoscopy have been reviewed with the patient.  Questions have been answered.  All parties agreeable.   Wyline Mood, MD  12/03/2023, 10:33 AM

## 2023-12-03 NOTE — Anesthesia Preprocedure Evaluation (Addendum)
 Anesthesia Evaluation  Patient identified by MRN, date of birth, ID band Patient awake    Reviewed: Allergy & Precautions, H&P , NPO status , Patient's Chart, lab work & pertinent test results  Airway Mallampati: III  TM Distance: >3 FB Neck ROM: full    Dental  (+) Missing   Pulmonary former smoker   Pulmonary exam normal        Cardiovascular hypertension, Normal cardiovascular exam     Neuro/Psych  Neuromuscular disease (Paraparesis of both lower limbs (HCC) Peripheral autonomic neuropathy due to diabetes mellitus) CVA  negative psych ROS   GI/Hepatic negative GI ROS, Neg liver ROS,,,  Endo/Other  diabetes, Type 2    Renal/GU negative Renal ROS  negative genitourinary   Musculoskeletal   Abdominal Normal abdominal exam  (+)   Peds  Hematology negative hematology ROS (+)   Anesthesia Other Findings Past Medical History: No date: Arthritis No date: Depression No date: Diabetes mellitus without complication (HCC) No date: Frequent headaches No date: Gout No date: Hyperlipidemia No date: Hypertension No date: Stroke [facility])  Past Surgical History: 2007: BACK SURGERY     Comment:  [facility] 01/27/2020: COLONOSCOPY WITH PROPOFOL; N/A     Comment:  Procedure: COLONOSCOPY WITH PROPOFOL;  Surgeon: Wyline Mood, MD;  Location: [facility] ENDOSCOPY;  Service:               Gastroenterology;  Laterality: N/A; 01/28/2020: COLONOSCOPY WITH PROPOFOL; N/A     Comment:  Procedure: COLONOSCOPY WITH PROPOFOL;  Surgeon: Wyline Mood, MD;  Location: [facility] ENDOSCOPY;  Service:               Gastroenterology;  Laterality: N/A; 10/08/2023: COLONOSCOPY WITH PROPOFOL; N/A     Comment:  Procedure: COLONOSCOPY WITH PROPOFOL;  Surgeon: Wyline Mood, MD;  Location: [facility] ENDOSCOPY;  Service:               Gastroenterology;  Laterality: N/A; No date: TONSILLECTOMY     Reproductive/Obstetrics negative OB  ROS                             Anesthesia Physical Anesthesia Plan  ASA: 3  Anesthesia Plan: General   Post-op Pain Management:    Induction: Intravenous  PONV Risk Score and Plan: Propofol infusion and TIVA  Airway Management Planned: Natural Airway  Additional Equipment:   Intra-op Plan:   Post-operative Plan:   Informed Consent: I have reviewed the patients History and Physical, chart, labs and discussed the procedure including the risks, benefits and alternatives for the proposed anesthesia with the patient or authorized representative who has indicated his/her understanding and acceptance.     Dental Advisory Given  Plan Discussed with: CRNA and Surgeon  Anesthesia Plan Comments:         Anesthesia Quick Evaluation

## 2023-12-03 NOTE — Op Note (Signed)
 [facility] Gastroenterology Patient Name: Jamie Finley Procedure Date: 12/03/2023 10:54 AM MRN: [id number] Account #: [id number] Date of Birth: 07-Jul-1961 Admit Type: Outpatient Age: 63 Room: [facility] ENDO ROOM 1 Gender: Male Note Status: Finalized Instrument Name: Prentice Docker [id number] Procedure:             Colonoscopy Indications:           Surveillance: Personal history of adenomatous polyps                         on last colonoscopy > 3 years ago, Last colonoscopy:                         May 2021 Providers:             Wyline Mood MD, MD Medicines:             Monitored Anesthesia Care Complications:         No immediate complications. Procedure:             Pre-Anesthesia Assessment:                        - Prior to the procedure, a History and Physical was                         performed, and patient medications, allergies and                         sensitivities were reviewed. The patient's tolerance                         of previous anesthesia was reviewed.                        - The risks and benefits of the procedure and the                         sedation options and risks were discussed with the                         patient. All questions were answered and informed                         consent was obtained.                        - ASA Grade Assessment: II - A patient with mild                         systemic disease.                        After obtaining informed consent, the colonoscope was                         passed under direct vision. Throughout the procedure,                         the patient's blood pressure, pulse, and oxygen  saturations were monitored continuously. The                         Colonoscope was introduced through the anus and                         advanced to the the cecum, identified by the                         appendiceal orifice. The colonoscopy was performed                          with ease. The patient tolerated the procedure well.                         The quality of the bowel preparation was good. The                         ileocecal valve, appendiceal orifice, and rectum were                         photographed. Findings:      The perianal and digital rectal examinations were normal.      A 5 mm polyp was found in the ascending colon. The polyp was sessile.       The polyp was removed with a cold snare. Resection and retrieval were       complete. To prevent bleeding post-intervention, one hemostatic clip was       successfully placed. Clip manufacturer: [employer]. There was no       bleeding at the end of the procedure.      A 3 mm polyp was found in the ascending colon. The polyp was sessile.       The polyp was removed with a cold biopsy forceps. Resection and       retrieval were complete.      The exam was otherwise without abnormality on direct and retroflexion       views. Impression:            - One 5 mm polyp in the ascending colon, removed with                         a cold snare. Resected and retrieved. Clip was placed.                         Clip manufacturer: [employer].                        - One 3 mm polyp in the ascending colon, removed with                         a cold biopsy forceps. Resected and retrieved.                        - The examination was otherwise normal on direct and                         retroflexion views. Recommendation:        - Discharge patient to  home (with escort).                        - Resume previous diet.                        - Continue present medications.                        - Await pathology results.                        - Repeat colonoscopy in 5 years for surveillance based                         on pathology results. Procedure Code(s):     --- Professional ---                        [phone], Colonoscopy, flexible; with removal of                         tumor(s),  polyp(s), or other lesion(s) by snare                         technique                        45380, 59, Colonoscopy, flexible; with biopsy, single                         or multiple Diagnosis Code(s):     --- Professional ---                        Z86.010, Personal history of colonic polyps                        D12.2, Benign neoplasm of ascending colon CPT copyright 2022 American Medical Association. All rights reserved. The codes documented in this report are preliminary and upon coder review may  be revised to meet current compliance requirements. Wyline Mood, MD Wyline Mood MD, MD 12/03/2023 11:42:50 AM This report has been signed electronically. Number of Addenda: 0 Note Initiated On: 12/03/2023 10:54 AM Scope Withdrawal Time: 0 hours 12 minutes 29 seconds  Total Procedure Duration: 0 hours 16 minutes 19 seconds  Estimated Blood Loss:  Estimated blood loss: none.      [facility]

## 2023-12-03 NOTE — Transfer of Care (Signed)
 Immediate Anesthesia Transfer of Care Note  Patient: Jamie Finley  Procedure(s) Performed: COLONOSCOPY WITH PROPOFOL POLYPECTOMY  Patient Location: PACU  Anesthesia Type:General  Level of Consciousness: drowsy  Airway & Oxygen Therapy: Patient Spontanous Breathing  Post-op Assessment: Report given to RN and Post -op Vital signs reviewed and stable  Post vital signs: Reviewed and stable  Last Vitals:  Vitals Value Taken Time  BP 92/55 12/03/23 1145  Temp    Pulse 76 12/03/23 1145  Resp 14 12/03/23 1145  SpO2 95 % 12/03/23 1145  Vitals shown include unfiled device data.  Last Pain:  Vitals:   12/03/23 1105  TempSrc: Temporal  PainSc: 0-No pain         Complications: There were no known notable events for this encounter.

## 2023-12-04 LAB — SURGICAL PATHOLOGY

## 2023-12-04 NOTE — Anesthesia Postprocedure Evaluation (Signed)
 Anesthesia Post Note  Patient: Jamie Finley  Procedure(s) Performed: COLONOSCOPY WITH PROPOFOL POLYPECTOMY  Patient location during evaluation: PACU Anesthesia Type: General Level of consciousness: awake and alert Pain management: pain level controlled Vital Signs Assessment: post-procedure vital signs reviewed and stable Respiratory status: spontaneous breathing, nonlabored ventilation and respiratory function stable Cardiovascular status: blood pressure returned to baseline and stable Postop Assessment: no apparent nausea or vomiting Anesthetic complications: no   There were no known notable events for this encounter.   Last Vitals:  Vitals:   12/03/23 1156 12/03/23 1206  BP: 102/67 118/77  Pulse: 75 76  Resp: 20 15  Temp:    SpO2: 95% 96%    Last Pain:  Vitals:   12/03/23 1206  TempSrc:   PainSc: 0-No pain                 Foye Deer

## 2023-12-07 ENCOUNTER — Ambulatory Visit: Payer: Self-pay

## 2023-12-07 NOTE — Patient Outreach (Signed)
 Care Coordination   Follow Up Visit Note   12/07/2023 Name: Jamie Finley MRN: [id number] DOB: 1961-08-05  Jamie Finley is a 63 y.o. year old male who sees Jamie Seltzer, MD for primary care. I spoke with  Jamie Finley by phone today.  What matters to the patients health and wellness today?  Patient states he continues to have good and bad days regarding right shoulder. He states he feels it everyday but some days can be worse than other.  He states he continues ROM exercises. Patient states he is aware to follow up with Dr Jamie Finley for ongoing shoulder concerns..  He states he doesn't have a follow up visit scheduled with him at this time he will just follow up as needed.  Patient reports having colonoscopy on 12/03/23. He states he had a few polyps that were sent for testing. Patient reports blood sugars are ranging from 120-140's.  Denies any hypoglycemic events. Denies any blood sugars > 250.  Patient states he noticed some difference in blood sugar readings during time of colonoscopy prep due to being off medication however blood sugars returning to normal now. Patient states he occasionally has constipation.    Goals Addressed             This Visit's Progress    management and education of chronic health conditions.       Interventions Today    Flowsheet Row Most Recent Value  Chronic Disease   Chronic disease during today's visit Diabetes, Other  [right shoulder pain]  General Interventions   General Interventions Discussed/Reviewed General Interventions Reviewed, Labs, Doctor Visits  [evaluation of current treatment plan for listed health conditions and patients adherence to plan as established by provider. Assessed for blood sugar readings / right shoulder symptoms and for any new/ ongoing symptoms.]  Labs Hgb A1c annually  [reviewed previous Hgb A1c results and goal.]  Doctor Visits Discussed/Reviewed Doctor Visits Reviewed  Dorna Bloom upcoming provider visit. Reminded  patient of lab work appointment on 12/20/23 and to be fasting for these labs.]  Exercise Interventions   Exercise Discussed/Reviewed Physical Activity  [Assessed ongoing  ROM exercises of right shoulder.  Encouraged patient to continue this exercises daily.]  Education Interventions   Education Provided Provided Education  [Rule of 15 for hypoglycemia treatment reviewed.  Advised to eat protein after treating to help stabilize blood sugar and prevent further drops. Discussed why blood sugar may have gone up for a day or so after colonoscopy.]  Provided Verbal Education On Other, Blood Sugar Monitoring  [reinforced need to notify provider for frequent blood sugas <70 or > 250.]  Nutrition Interventions   Nutrition Discussed/Reviewed Nutrition Reviewed  [Discussed importance of high fiber diet. Inquired if patient having any GI effects from 2000 mg per day metformin. Offered to send patient education article on high fiber diet.]  Pharmacy Interventions   Pharmacy Dicussed/Reviewed Pharmacy Topics Reviewed  [medications reviewed and compliance discussed and advised.  medication side effects discussed.]              SDOH assessments and interventions completed:  No     Care Coordination Interventions:  Yes, provided   Follow up plan: Follow up call scheduled for 01/10/24 at 10 am    Encounter Outcome:  Patient Visit Completed   George Ina RN, BSN, CCM [place]  [facility], Population Health Case Manager Phone: [phone]

## 2023-12-07 NOTE — Patient Instructions (Signed)
 Visit Information  Thank you for taking time to visit with me today. Please don't hesitate to contact me if I can be of assistance to you.   Following are the goals we discussed today:   Goals Addressed             This Visit's Progress    management and education of chronic health conditions.       Interventions Today    Flowsheet Row Most Recent Value  Chronic Disease   Chronic disease during today's visit Diabetes, Other  [right shoulder pain]  General Interventions   General Interventions Discussed/Reviewed General Interventions Reviewed, Labs, Doctor Visits  [evaluation of current treatment plan for listed health conditions and patients adherence to plan as established by provider. Assessed for blood sugar readings / right shoulder symptoms and for any new/ ongoing symptoms.]  Labs Hgb A1c annually  [reviewed previous Hgb A1c results and goal.]  Doctor Visits Discussed/Reviewed Doctor Visits Reviewed  Dorna Bloom upcoming provider visit. Reminded patient of lab work appointment on 12/20/23 and to be fasting for these labs. Advised to follow up with Dr. Dallas Schimke for ongoing shoulder issues.]  Exercise Interventions   Exercise Discussed/Reviewed Physical Activity  [Assessed ongoing  ROM exercises of right shoulder.  Encouraged patient to continue this exercises daily.]  Education Interventions   Education Provided Provided Education  [Rule of 15 for hypoglycemia treatment reviewed.  Advised to eat protein after treating to help stabilize blood sugar and prevent further drops. Discussed why blood sugar may have gone up for a day or so after colonoscopy.]  Provided Verbal Education On Other, Blood Sugar Monitoring  [reinforced need to notify provider for frequent blood sugas <70 or > 250.]  Nutrition Interventions   Nutrition Discussed/Reviewed Nutrition Reviewed  [Discussed importance of high fiber diet. Inquired if patient having any GI effects from 2000 mg per day metformin. Offered to  send patient education article on high fiber diet.]  Pharmacy Interventions   Pharmacy Dicussed/Reviewed Pharmacy Topics Reviewed  [medications reviewed and compliance discussed and advised.  medication side effects discussed.]              Our next appointment is by telephone on 01/10/24 at 10 am  Please call the care guide team at [phone] if you need to cancel or reschedule your appointment.   If you are experiencing a Mental Health or Behavioral Health Crisis or need someone to talk to, please call the Suicide and Crisis Lifeline: 988 call 1-800-273-TALK (toll free, 24 hour hotline)  Patient verbalizes understanding of instructions and care plan provided today and agrees to view in MyChart. Active MyChart status and patient understanding of how to access instructions and care plan via MyChart confirmed with patient.     George Ina RN, BSN, CCM [employer], Population Health Case Manager Phone: [phone]

## 2023-12-11 ENCOUNTER — Encounter: Payer: Self-pay | Admitting: Gastroenterology

## 2023-12-20 ENCOUNTER — Other Ambulatory Visit: Payer: Medicare HMO

## 2023-12-20 ENCOUNTER — Encounter: Payer: Self-pay | Admitting: Family Medicine

## 2023-12-20 DIAGNOSIS — E785 Hyperlipidemia, unspecified: Secondary | ICD-10-CM | POA: Diagnosis not present

## 2023-12-20 DIAGNOSIS — E1169 Type 2 diabetes mellitus with other specified complication: Secondary | ICD-10-CM | POA: Diagnosis not present

## 2023-12-20 DIAGNOSIS — E1149 Type 2 diabetes mellitus with other diabetic neurological complication: Secondary | ICD-10-CM | POA: Diagnosis not present

## 2023-12-20 LAB — COMPREHENSIVE METABOLIC PANEL WITH GFR
ALT: 16 U/L (ref 0–53)
AST: 18 U/L (ref 0–37)
Albumin: 4.5 g/dL (ref 3.5–5.2)
Alkaline Phosphatase: 59 U/L (ref 39–117)
BUN: 19 mg/dL (ref 6–23)
CO2: 30 meq/L (ref 19–32)
Calcium: 9.2 mg/dL (ref 8.4–10.5)
Chloride: 99 meq/L (ref 96–112)
Creatinine, Ser: 1.07 mg/dL (ref 0.40–1.50)
GFR: 74.05 mL/min (ref >60.00)
Glucose, Bld: 110 mg/dL — ABNORMAL HIGH (ref 70–99)
Potassium: 3.8 meq/L (ref 3.5–5.1)
Sodium: 139 meq/L (ref 135–145)
Total Bilirubin: 0.9 mg/dL (ref 0.2–1.2)
Total Protein: 7.3 g/dL (ref 6.0–8.3)

## 2023-12-20 LAB — LIPID PANEL
Cholesterol: 145 mg/dL (ref 0–200)
HDL: 45.7 mg/dL (ref >39.00)
LDL Cholesterol: 65 mg/dL (ref 0–99)
NonHDL: 99.09
Total CHOL/HDL Ratio: 3
Triglycerides: 171 mg/dL — ABNORMAL HIGH (ref 0.0–149.0)
VLDL: 34.2 mg/dL (ref 0.0–40.0)

## 2023-12-20 LAB — HEMOGLOBIN A1C: Hgb A1c MFr Bld: 8.7 % — ABNORMAL HIGH (ref 4.6–6.5)

## 2023-12-20 NOTE — Progress Notes (Signed)
 No critical labs need to be addressed urgently. We will discuss labs in detail at upcoming office visit.

## 2023-12-27 ENCOUNTER — Encounter: Payer: Self-pay | Admitting: Family Medicine

## 2023-12-27 ENCOUNTER — Ambulatory Visit (INDEPENDENT_AMBULATORY_CARE_PROVIDER_SITE_OTHER)
Admission: RE | Admit: 2023-12-27 | Discharge: 2023-12-27 | Disposition: A | Discharge status: Home / Self Care | Source: Ambulatory Visit | Attending: Family Medicine | Admitting: Family Medicine

## 2023-12-27 ENCOUNTER — Ambulatory Visit (INDEPENDENT_AMBULATORY_CARE_PROVIDER_SITE_OTHER): Payer: Medicare HMO | Admitting: Family Medicine

## 2023-12-27 VITALS — BP 124/64 | HR 60 | Temp 98.0°F | Ht 72.0 in | Wt 314.8 lb

## 2023-12-27 DIAGNOSIS — E1159 Type 2 diabetes mellitus with other circulatory complications: Secondary | ICD-10-CM | POA: Diagnosis not present

## 2023-12-27 DIAGNOSIS — M25532 Pain in left wrist: Secondary | ICD-10-CM | POA: Insufficient documentation

## 2023-12-27 DIAGNOSIS — M1812 Unilateral primary osteoarthritis of first carpometacarpal joint, left hand: Secondary | ICD-10-CM | POA: Diagnosis not present

## 2023-12-27 DIAGNOSIS — W19XXXA Unspecified fall, initial encounter: Secondary | ICD-10-CM | POA: Diagnosis not present

## 2023-12-27 DIAGNOSIS — Z7984 Long term (current) use of oral hypoglycemic drugs: Secondary | ICD-10-CM | POA: Diagnosis not present

## 2023-12-27 DIAGNOSIS — I152 Hypertension secondary to endocrine disorders: Secondary | ICD-10-CM

## 2023-12-27 DIAGNOSIS — E1149 Type 2 diabetes mellitus with other diabetic neurological complication: Secondary | ICD-10-CM | POA: Diagnosis not present

## 2023-12-27 NOTE — Progress Notes (Signed)
 Patient ID: Jamie Finley, male    DOB: 1961-03-13, 63 y.o.   MRN: [id number]  This visit was conducted in person.  BP 124/64   Pulse 60   Temp 98 F (36.7 C) (Oral)   Ht 6' (1.829 m)   Wt (!) 314 lb 12.8 oz (142.8 kg)   SpO2 94%   BMI 42.69 kg/m    CC:  Chief Complaint  Patient presents with   Diabetes    3 month follow up    Wrist Injury    Patient fell a couple of weeks ago and hurt his wrist    Subjective:   HPI: Jamie Finley is a 63 y.o. male presenting on 12/27/2023 for Diabetes (3 month follow up ) and Wrist Injury (Patient fell a couple of weeks ago and hurt his wrist)  Diabetes:  significant improved control of diabetes  Currently only taking metformin 500 mg XR 2 tablets twice daily, max dose and glipizide XL max.  Tolerating jardiance 10 mg daily... no urinary infection no perineal issues. Lab Results  Component Value Date   HGBA1C 8.7 (H) 12/20/2023  In the past he has tolerated glipizide, Actos has caused peripheral swelling  Was on insulin in past.  SE to Rybelsus and GLP1 injections Has  given up sugary sodas Using medications without difficulties: none Hypoglycemic episodes: none Hyperglycemic episodes:    one  at 330 Feet problems: no ulcers. Blood Sugars averaging:  110-200.Marland Kitchen average 163 eye exam within last year: Urine microalbumin collected today   Wt Readings from Last 3 Encounters:  12/27/23 (!) 314 lb 12.8 oz (142.8 kg)  12/03/23 (!) 305 lb (138.3 kg)  10/24/23 (!) 313 lb 6 oz (142.1 kg)   Hypertension:   HCTZ 25 mg p.o. daily as needed swelling BP Readings from Last 3 Encounters:  12/27/23 124/64  12/03/23 118/77  10/24/23 90/60  Using medication without problems or lightheadedness:  Chest pain with exertion: Edema: Short of breath: Average home BPs: Other issues:  Fell accidentally,  slipped off  stool on wheels , caught with left outstretched han... injury to left wrist   Occurred 2 weeks ago.  No swelling, no  bruising.  Pain with movement.  He iced it and wrapped wrist. Pain in left dorsal radial wrist.  Using topical voltaren.  Relevant past medical, surgical, family and social history reviewed and updated as indicated. Interim medical history since our last visit reviewed. Allergies and medications reviewed and updated. Outpatient Medications Prior to Visit  Medication Sig Dispense Refill   Accu-Chek Softclix Lancets lancets Use to check fasting blood sugar daily 100 each 3   acetaminophen (TYLENOL) 325 MG tablet Take 325 mg by mouth every 4 (four) hours as needed.      ammonium lactate (AMLACTIN) 12 % lotion Apply 1 Application topically as needed for dry skin. 400 g 0   aspirin 81 MG tablet Take 81 mg by mouth daily.     blood glucose meter kit and supplies Dispense based on patient and insurance preference. Use to check blood sugar two times a day. 1 each 0   Blood Glucose Monitoring Suppl (ACCU-CHEK GUIDE) w/Device KIT Use to check blood fasting blood sugar daily 1 kit 0   Cholecalciferol (VITAMIN D3) 50 MCG (2000 UT) TABS Take 5-7 tablets by mouth daily.     colchicine 0.6 MG tablet Take 1 tablet (0.6 mg total) by mouth daily as needed. 30 tablet 2   diclofenac Sodium (VOLTAREN) 1 % GEL  Apply topically 4 (four) times daily.     empagliflozin (JARDIANCE) 10 MG TABS tablet Take 1 tablet (10 mg total) by mouth daily before breakfast. 30 tablet 11   fluorometholone (FML) 0.1 % ophthalmic suspension Place 1 drop into both eyes 4 (four) times daily.     glipiZIDE (GLUCOTROL XL) 10 MG 24 hr tablet Take 1 tablet (10 mg total) by mouth daily with breakfast. 30 tablet 11   glucose blood (ACCU-CHEK GUIDE) test strip Use to check blood fasting blood sugar daily 100 each 3   hydrochlorothiazide (HYDRODIURIL) 25 MG tablet Take 1 tablet (25 mg total) by mouth daily as needed. 90 tablet 3   metFORMIN (GLUCOPHAGE-XR) 500 MG 24 hr tablet Take 2 tablets (1,000 mg total) by mouth 2 (two) times daily with a  meal. 360 tablet 3   methocarbamol (ROBAXIN) 750 MG tablet Take 1 tablet (750 mg total) by mouth daily as needed for muscle spasms. 30 tablet 1   Multiple Vitamins-Minerals (ZINC PO) Take 1 tablet by mouth daily.     Plecanatide (TRULANCE) 3 MG TABS Take 1 tablet by mouth once daily 90 tablet 3   polyethylene glycol (MIRALAX / GLYCOLAX) 17 g packet Take 17 g by mouth daily.     rosuvastatin (CRESTOR) 20 MG tablet Take 1 tablet (20 mg total) by mouth at bedtime. 90 tablet 3   sildenafil (REVATIO) 20 MG tablet Take 5 tablets prior to sexual activity 20 tablet 11   No facility-administered medications prior to visit.     Per HPI unless specifically indicated in ROS section below Review of Systems  Constitutional:  Negative for fatigue and fever.  HENT:  Negative for ear pain.   Eyes:  Negative for pain.  Respiratory:  Negative for cough and shortness of breath.   Cardiovascular:  Negative for chest pain, palpitations and leg swelling.  Gastrointestinal:  Positive for constipation. Negative for abdominal pain.  Genitourinary:  Negative for dysuria.  Musculoskeletal:  Negative for arthralgias.  Skin:  Positive for rash.  Neurological:  Negative for syncope, light-headedness and headaches.  Psychiatric/Behavioral:  Negative for dysphoric mood.    Objective:  BP 124/64   Pulse 60   Temp 98 F (36.7 C) (Oral)   Ht 6' (1.829 m)   Wt (!) 314 lb 12.8 oz (142.8 kg)   SpO2 94%   BMI 42.69 kg/m   Wt Readings from Last 3 Encounters:  12/27/23 (!) 314 lb 12.8 oz (142.8 kg)  12/03/23 (!) 305 lb (138.3 kg)  10/24/23 (!) 313 lb 6 oz (142.1 kg)      Physical Exam Constitutional:      Appearance: He is well-developed.  HENT:     Head: Normocephalic.     Right Ear: Hearing normal.     Left Ear: Hearing normal.     Nose: Nose normal.  Neck:     Thyroid: No thyroid mass or thyromegaly.     Vascular: No carotid bruit.     Trachea: Trachea normal.  Cardiovascular:     Rate and Rhythm:  Normal rate and regular rhythm.     Pulses: Normal pulses.     Heart sounds: Heart sounds not distant. No murmur heard.    No friction rub. No gallop.     Comments: No peripheral edema Pulmonary:     Effort: Pulmonary effort is normal. No respiratory distress.     Breath sounds: Normal breath sounds.  Musculoskeletal:     Left wrist: Bony tenderness present.  No swelling or snuff box tenderness. Decreased range of motion. Normal pulse.     Right hand: Normal.     Left hand: Normal. Normal range of motion.  Skin:    General: Skin is warm and dry.     Findings: No lesion or rash.  Psychiatric:        Speech: Speech normal.        Behavior: Behavior normal.        Thought Content: Thought content normal.       Results for orders placed or performed in visit on 12/20/23  Lipid panel   Collection Time: 12/20/23 10:12 AM  Result Value Ref Range   Cholesterol 145 0 - 200 mg/dL   Triglycerides 782.9 (H) 0.0 - 149.0 mg/dL   HDL 56.21 >30.86 mg/dL   VLDL 57.8 0.0 - 46.9 mg/dL   LDL Cholesterol 65 0 - 99 mg/dL   Total CHOL/HDL Ratio 3    NonHDL 99.09   Hemoglobin A1c   Collection Time: 12/20/23 10:12 AM  Result Value Ref Range   Hgb A1c MFr Bld 8.7 (H) 4.6 - 6.5 %  Comprehensive metabolic panel   Collection Time: 12/20/23 10:12 AM  Result Value Ref Range   Sodium 139 135 - 145 mEq/L   Potassium 3.8 3.5 - 5.1 mEq/L   Chloride 99 96 - 112 mEq/L   CO2 30 19 - 32 mEq/L   Glucose, Bld 110 (H) 70 - 99 mg/dL   BUN 19 6 - 23 mg/dL   Creatinine, Ser 6.29 0.40 - 1.50 mg/dL   Total Bilirubin 0.9 0.2 - 1.2 mg/dL   Alkaline Phosphatase 59 39 - 117 U/L   AST 18 0 - 37 U/L   ALT 16 0 - 53 U/L   Total Protein 7.3 6.0 - 8.3 g/dL   Albumin 4.5 3.5 - 5.2 g/dL   GFR 52.84 >13.24 mL/min   Calcium 9.2 8.4 - 10.5 mg/dL    Assessment and Plan  Fall, initial encounter  Acute pain of left wrist Assessment & Plan: Acute, most likely musculoskeletal strain/wrist sprain will evaluate with x-ray  given persistent pain focally over dorsal wrist. Continue using Voltaren gel, brace rest and elevate.  Orders: -     DG Wrist Complete Left; Future  Type 2 diabetes mellitus with neurological complications [facility]) Assessment & Plan:  Chronic, improving control.  Now trying to be more active using rolling walker, continue to work on healthy low-carb diet. On maximum dose metformin.  Back on glipizide XL 10 mg p.o. daily with meal. Tolerating Jardiance 10 mg daily.  No current evidence of UTI or perineal infection. Given blood sugars trending down significantly will hold off on increasing dose given concern for bladder issues with history of neurogenic bladder. Reevaluate A1c in 3 months.     Hypertension associated with diabetes (HCC) Assessment & Plan: Stable, chronic.  Continue current medication.   HCTZ 25 mg p.o. daily as needed swelling      Return in about 3 months (around 03/27/2024) for diabetes follow up with POC A1C.   Herby Lolling, MD

## 2023-12-27 NOTE — Assessment & Plan Note (Signed)
Stable, chronic.  Continue current medication.   HCTZ 25 mg p.o. daily as needed swelling

## 2023-12-27 NOTE — Assessment & Plan Note (Addendum)
 Chronic, improving control.  Now trying to be more active using rolling walker, continue to work on healthy low-carb diet. On maximum dose metformin.  Back on glipizide XL 10 mg p.o. daily with meal. Tolerating Jardiance 10 mg daily.  No current evidence of UTI or perineal infection. Given blood sugars trending down significantly will hold off on increasing dose given concern for bladder issues with history of neurogenic bladder. Reevaluate A1c in 3 months.

## 2023-12-27 NOTE — Assessment & Plan Note (Signed)
 Acute, most likely musculoskeletal strain/wrist sprain will evaluate with x-ray given persistent pain focally over dorsal wrist. Continue using Voltaren gel, brace rest and elevate.

## 2024-01-01 ENCOUNTER — Encounter: Payer: Self-pay | Admitting: Family Medicine

## 2024-01-03 ENCOUNTER — Ambulatory Visit (INDEPENDENT_AMBULATORY_CARE_PROVIDER_SITE_OTHER): Payer: Medicare HMO | Admitting: Podiatry

## 2024-01-03 ENCOUNTER — Ambulatory Visit (INDEPENDENT_AMBULATORY_CARE_PROVIDER_SITE_OTHER): Admitting: Family Medicine

## 2024-01-03 ENCOUNTER — Encounter: Payer: Self-pay | Admitting: Podiatry

## 2024-01-03 ENCOUNTER — Ambulatory Visit (INDEPENDENT_AMBULATORY_CARE_PROVIDER_SITE_OTHER)
Admission: RE | Admit: 2024-01-03 | Discharge: 2024-01-03 | Disposition: A | Discharge status: Home / Self Care | Source: Ambulatory Visit | Attending: Family Medicine | Admitting: Family Medicine

## 2024-01-03 ENCOUNTER — Encounter: Payer: Self-pay | Admitting: Family Medicine

## 2024-01-03 VITALS — BP 96/66 | HR 60 | Temp 97.6°F | Ht 72.0 in

## 2024-01-03 DIAGNOSIS — B351 Tinea unguium: Secondary | ICD-10-CM | POA: Diagnosis not present

## 2024-01-03 DIAGNOSIS — M79674 Pain in right toe(s): Secondary | ICD-10-CM | POA: Diagnosis not present

## 2024-01-03 DIAGNOSIS — Z23 Encounter for immunization: Secondary | ICD-10-CM | POA: Diagnosis not present

## 2024-01-03 DIAGNOSIS — E1149 Type 2 diabetes mellitus with other diabetic neurological complication: Secondary | ICD-10-CM

## 2024-01-03 DIAGNOSIS — S61233A Puncture wound without foreign body of left middle finger without damage to nail, initial encounter: Secondary | ICD-10-CM

## 2024-01-03 DIAGNOSIS — M19042 Primary osteoarthritis, left hand: Secondary | ICD-10-CM | POA: Diagnosis not present

## 2024-01-03 DIAGNOSIS — S6992XA Unspecified injury of left wrist, hand and finger(s), initial encounter: Secondary | ICD-10-CM | POA: Diagnosis not present

## 2024-01-03 DIAGNOSIS — M79675 Pain in left toe(s): Secondary | ICD-10-CM | POA: Diagnosis not present

## 2024-01-03 MED ORDER — DOXYCYCLINE HYCLATE 100 MG PO TABS
100.0000 mg | ORAL_TABLET | Freq: Two times a day (BID) | ORAL | 0 refills | Status: DC
Start: 2024-01-03 — End: 2024-02-20

## 2024-01-03 MED ORDER — AMOXICILLIN-POT CLAVULANATE 875-125 MG PO TABS: 1.0000 | ORAL_TABLET | Freq: Two times a day (BID) | ORAL | 0 refills | Status: DC

## 2024-01-03 MED ORDER — HYDROCODONE-ACETAMINOPHEN 5-325 MG PO TABS: 1.0000 | ORAL_TABLET | Freq: Four times a day (QID) | ORAL | 0 refills | Status: DC | PRN

## 2024-01-03 NOTE — Assessment & Plan Note (Signed)
 Chronic, poorly controlled and puts patient at risk for more significant infection.  Encourage patient to follow blood sugar at home closely while dealing with possible infection.

## 2024-01-03 NOTE — Assessment & Plan Note (Signed)
 Acute, injury with significant force of nail gun.  Will evaluate with x-ray for bony fracture.  Some increased concern for deep tissue infection although nail was clean.  Patient has higher risk for infection given diabetes. Offered ceftriaxone injection in office to get antibiotic treatment started swiftly but patient is not interested at this time.  I will treat with Augmentin  875/125 mg twice daily for 10 days, but will consider adding doxycycline  if not improving given patient has had a history of MRSA in the past. Recommended elevating hand ice and can use hydrocodone  prescription as needed for pain. No current sign in office today of systemic symptoms or sepsis.. May need referral to general surgery or hand specialist depending on results of x-ray, and whether symptoms improve with oral antibiotic. Close follow-up planned.

## 2024-01-03 NOTE — Progress Notes (Signed)
 Patient ID: Jamie Finley, male    DOB: 1961/04/11, 63 y.o.   MRN: [id number]  This visit was conducted in person.  BP 96/66 (BP Location: Right Arm, Patient Position: Sitting, Cuff Size: Large)   Pulse 60   Temp 97.6 F (36.4 C) (Temporal)   Ht 6' (1.829 m)   SpO2 98%   BMI 42.69 kg/m    CC:  Chief Complaint  Patient presents with   Finger Injury    Punctured Left Middle Finger with nail-Last Saturday    Subjective:   HPI: Jamie Finley is a 63 y.o. male presenting on 01/03/2024 for Finger Injury (Punctured Left Middle Finger with nail-Last Saturday)  6 days ago punctured finger with nail... via nail gum. New nail, not broken Nail went partially through.  Cleaned wound. Neosporin.  Now gradually increasing pain and swelling in finger. Shooting pain.  No pus discharge.   No flu like symptoms, no fever    Tetanus: DUE.  Pt has diabetes .Aaron Aas Has not been checking. Lab Results  Component Value Date   HGBA1C 8.7 (H) 12/20/2023    Currently treating wrist  sprain with brace, on left. Relevant past medical, surgical, family and social history reviewed and updated as indicated. Interim medical history since our last visit reviewed. Allergies and medications reviewed and updated. Outpatient Medications Prior to Visit  Medication Sig Dispense Refill   Accu-Chek Softclix Lancets lancets Use to check fasting blood sugar daily 100 each 3   ammonium lactate  (AMLACTIN) 12 % lotion Apply 1 Application topically as needed for dry skin. 400 g 0   aspirin  81 MG tablet Take 81 mg by mouth daily.     blood glucose meter kit and supplies Dispense based on patient and insurance preference. Use to check blood sugar two times a day. 1 each 0   Blood Glucose Monitoring Suppl (ACCU-CHEK GUIDE) w/Device KIT Use to check blood fasting blood sugar daily 1 kit 0   Cholecalciferol (VITAMIN D3) 50 MCG (2000 UT) TABS Take 5-7 tablets by mouth daily.     colchicine  0.6 MG tablet Take 1 tablet  (0.6 mg total) by mouth daily as needed. 30 tablet 2   diclofenac  Sodium (VOLTAREN ) 1 % GEL Apply topically 4 (four) times daily.     empagliflozin  (JARDIANCE ) 10 MG TABS tablet Take 1 tablet (10 mg total) by mouth daily before breakfast. 30 tablet 11   fluorometholone (FML) 0.1 % ophthalmic suspension Place 1 drop into both eyes 4 (four) times daily.     glipiZIDE  (GLUCOTROL  XL) 10 MG 24 hr tablet Take 1 tablet (10 mg total) by mouth daily with breakfast. 30 tablet 11   glucose blood (ACCU-CHEK GUIDE) test strip Use to check blood fasting blood sugar daily 100 each 3   hydrochlorothiazide  (HYDRODIURIL ) 25 MG tablet Take 1 tablet (25 mg total) by mouth daily as needed. 90 tablet 3   metFORMIN  (GLUCOPHAGE -XR) 500 MG 24 hr tablet Take 2 tablets (1,000 mg total) by mouth 2 (two) times daily with a meal. 360 tablet 3   methocarbamol  (ROBAXIN ) 750 MG tablet Take 1 tablet (750 mg total) by mouth daily as needed for muscle spasms. 30 tablet 1   Multiple Vitamins-Minerals (ZINC  PO) Take 1 tablet by mouth daily.     Plecanatide  (TRULANCE ) 3 MG TABS Take 1 tablet by mouth once daily 90 tablet 3   polyethylene glycol (MIRALAX  / GLYCOLAX ) 17 g packet Take 17 g by mouth daily.     rosuvastatin  (  CRESTOR ) 20 MG tablet Take 1 tablet (20 mg total) by mouth at bedtime. 90 tablet 3   sildenafil  (REVATIO ) 20 MG tablet Take 5 tablets prior to sexual activity (Patient not taking: Reported on 01/03/2024) 20 tablet 11   acetaminophen  (TYLENOL ) 325 MG tablet Take 325 mg by mouth every 4 (four) hours as needed.      No facility-administered medications prior to visit.     Per HPI unless specifically indicated in ROS section below Review of Systems  Constitutional:  Negative for fatigue and fever.  HENT:  Negative for ear pain.   Eyes:  Negative for pain.  Respiratory:  Negative for cough and shortness of breath.   Cardiovascular:  Negative for chest pain, palpitations and leg swelling.  Gastrointestinal:  Negative  for abdominal pain.  Genitourinary:  Negative for dysuria.  Musculoskeletal:  Negative for arthralgias.  Neurological:  Negative for syncope, light-headedness and headaches.  Psychiatric/Behavioral:  Negative for dysphoric mood.    Objective:  BP 96/66 (BP Location: Right Arm, Patient Position: Sitting, Cuff Size: Large)   Pulse 60   Temp 97.6 F (36.4 C) (Temporal)   Ht 6' (1.829 m)   SpO2 98%   BMI 42.69 kg/m   Wt Readings from Last 3 Encounters:  12/27/23 (!) 314 lb 12.8 oz (142.8 kg)  12/03/23 (!) 305 lb (138.3 kg)  10/24/23 (!) 313 lb 6 oz (142.1 kg)      Physical Exam Constitutional:      Appearance: He is well-developed.  HENT:     Head: Normocephalic.     Right Ear: Hearing normal.     Left Ear: Hearing normal.     Nose: Nose normal.  Neck:     Thyroid : No thyroid  mass or thyromegaly.     Vascular: No carotid bruit.     Trachea: Trachea normal.  Cardiovascular:     Rate and Rhythm: Normal rate and regular rhythm.     Pulses: Normal pulses.     Heart sounds: Heart sounds not distant. No murmur heard.    No friction rub. No gallop.     Comments: No peripheral edema Pulmonary:     Effort: Pulmonary effort is normal. No respiratory distress.     Breath sounds: Normal breath sounds.  Skin:    General: Skin is warm and dry.     Findings: No rash.     Comments: See photo, decreased RO of finger, very ttp at tip of finger both sides.  No hang or wrist pain, nml pulse and sensation in hand  Psychiatric:        Speech: Speech normal.        Behavior: Behavior normal.        Thought Content: Thought content normal.          Results for orders placed or performed in visit on 12/20/23  Lipid panel   Collection Time: 12/20/23 10:12 AM  Result Value Ref Range   Cholesterol 145 0 - 200 mg/dL   Triglycerides 578.4 (H) 0.0 - 149.0 mg/dL   HDL 69.62 >95.28 mg/dL   VLDL 41.3 0.0 - 24.4 mg/dL   LDL Cholesterol 65 0 - 99 mg/dL   Total CHOL/HDL Ratio 3    NonHDL  99.09   Hemoglobin A1c   Collection Time: 12/20/23 10:12 AM  Result Value Ref Range   Hgb A1c MFr Bld 8.7 (H) 4.6 - 6.5 %  Comprehensive metabolic panel   Collection Time: 12/20/23 10:12 AM  Result Value  Ref Range   Sodium 139 135 - 145 mEq/L   Potassium 3.8 3.5 - 5.1 mEq/L   Chloride 99 96 - 112 mEq/L   CO2 30 19 - 32 mEq/L   Glucose, Bld 110 (H) 70 - 99 mg/dL   BUN 19 6 - 23 mg/dL   Creatinine, Ser 4.09 0.40 - 1.50 mg/dL   Total Bilirubin 0.9 0.2 - 1.2 mg/dL   Alkaline Phosphatase 59 39 - 117 U/L   AST 18 0 - 37 U/L   ALT 16 0 - 53 U/L   Total Protein 7.3 6.0 - 8.3 g/dL   Albumin 4.5 3.5 - 5.2 g/dL   GFR 81.19 >14.78 mL/min   Calcium  9.2 8.4 - 10.5 mg/dL    Assessment and Plan  Puncture wound of left middle finger Assessment & Plan: Acute, injury with significant force of nail gun.  Will evaluate with x-ray for bony fracture.  Some increased concern for deep tissue infection although nail was clean.  Patient has higher risk for infection given diabetes. Offered ceftriaxone injection in office to get antibiotic treatment started swiftly but patient is not interested at this time.  I will treat with Augmentin  875/125 mg twice daily for 10 days, but will consider adding doxycycline  if not improving given patient has had a history of MRSA in the past. Recommended elevating hand ice and can use hydrocodone  prescription as needed for pain. No current sign in office today of systemic symptoms or sepsis.. May need referral to general surgery or hand specialist depending on results of x-ray, and whether symptoms improve with oral antibiotic. Close follow-up planned.   Orders: -     Td vaccine greater than or equal to 7yo preservative free IM -     DG Finger Middle Left  Type 2 diabetes mellitus with neurological complications (HCC) Assessment & Plan: Chronic, poorly controlled and puts patient at risk for more significant infection.  Encourage patient to follow blood sugar at home  closely while dealing with possible infection.   Other orders -     HYDROcodone -Acetaminophen ; Take 1 tablet by mouth every 6 (six) hours as needed for moderate pain (pain score 4-6) or severe pain (pain score 7-10).  Dispense: 20 tablet; Refill: 0 -     Amoxicillin -Pot Clavulanate; Take 1 tablet by mouth 2 (two) times daily.  Dispense: 20 tablet; Refill: 0    No follow-ups on file.   Herby Lolling, MD

## 2024-01-04 NOTE — Progress Notes (Signed)
 Agreed -

## 2024-01-08 ENCOUNTER — Ambulatory Visit (INDEPENDENT_AMBULATORY_CARE_PROVIDER_SITE_OTHER): Admitting: Family Medicine

## 2024-01-08 ENCOUNTER — Encounter: Payer: Self-pay | Admitting: Family Medicine

## 2024-01-08 VITALS — BP 108/80 | HR 80 | Temp 98.1°F | Ht 72.0 in | Wt 311.4 lb

## 2024-01-08 DIAGNOSIS — E1149 Type 2 diabetes mellitus with other diabetic neurological complication: Secondary | ICD-10-CM

## 2024-01-08 DIAGNOSIS — S61233A Puncture wound without foreign body of left middle finger without damage to nail, initial encounter: Secondary | ICD-10-CM

## 2024-01-08 NOTE — Assessment & Plan Note (Signed)
 Acute, with associated cellulitis.  No improvement with Augmentin  on day 5 out of 10 and doxycycline  day 5 out of 10 Still significant swelling and pain although gradually improving. Offered referral to hand specialist but patient would like to hold off at this time and give healing more time for recovery. I think this is reasonable but I am cautious given increased risk of bone infection complication given diabetes.  Return and ER precautions reviewed in detail.  Patient will let me know if redness, pain or swelling is worsening.  He will let me know if he has fever on antibiotics.

## 2024-01-08 NOTE — Progress Notes (Signed)
 Patient ID: Jamie Finley, male    DOB: July 02, 1961, 63 y.o.   MRN: [id number]  This visit was conducted in person.  BP 108/80 (BP Location: Left Arm, Patient Position: Sitting, Cuff Size: Large)   Pulse 80   Temp 98.1 F (36.7 C) (Oral)   Ht 6' (1.829 m)   Wt (!) 311 lb 6 oz (141.2 kg)   SpO2 98%   BMI 42.23 kg/m    CC:  Chief Complaint  Patient presents with   Finger Injury    Follow up     Subjective:   HPI: Jamie Finley is a 63 y.o. male  with DM presenting on 01/08/2024 for Finger Injury (Follow up/)  Follow up nail gun injury to  right middle finger. Occurred 11 days ago Seen in office on 01/03/2024  X-rays unremarkable.  On  5 days of antibiotics  Doxycycline  and Augmentin ... no SE.     No redness spreading.  Pain improving, still slightly sensitive.  Some improvement with swelling... but still cannot bend it all the way.  No numbness.  No fever.     Relevant past medical, surgical, family and social history reviewed and updated as indicated. Interim medical history since our last visit reviewed. Allergies and medications reviewed and updated. Outpatient Medications Prior to Visit  Medication Sig Dispense Refill   Accu-Chek Softclix Lancets lancets Use to check fasting blood sugar daily 100 each 3   ammonium lactate  (AMLACTIN) 12 % lotion Apply 1 Application topically as needed for dry skin. 400 g 0   amoxicillin -clavulanate (AUGMENTIN ) 875-125 MG tablet Take 1 tablet by mouth 2 (two) times daily. 20 tablet 0   aspirin  81 MG tablet Take 81 mg by mouth daily.     blood glucose meter kit and supplies Dispense based on patient and insurance preference. Use to check blood sugar two times a day. 1 each 0   Blood Glucose Monitoring Suppl (ACCU-CHEK GUIDE) w/Device KIT Use to check blood fasting blood sugar daily 1 kit 0   Cholecalciferol (VITAMIN D3) 50 MCG (2000 UT) TABS Take 5-7 tablets by mouth daily.     colchicine  0.6 MG tablet Take 1 tablet (0.6 mg  total) by mouth daily as needed. 30 tablet 2   diclofenac  Sodium (VOLTAREN ) 1 % GEL Apply topically 4 (four) times daily.     doxycycline  (VIBRA -TABS) 100 MG tablet Take 1 tablet (100 mg total) by mouth 2 (two) times daily. 20 tablet 0   empagliflozin  (JARDIANCE ) 10 MG TABS tablet Take 1 tablet (10 mg total) by mouth daily before breakfast. 30 tablet 11   fluorometholone (FML) 0.1 % ophthalmic suspension Place 1 drop into both eyes 4 (four) times daily.     glipiZIDE  (GLUCOTROL  XL) 10 MG 24 hr tablet Take 1 tablet (10 mg total) by mouth daily with breakfast. 30 tablet 11   glucose blood (ACCU-CHEK GUIDE) test strip Use to check blood fasting blood sugar daily 100 each 3   hydrochlorothiazide  (HYDRODIURIL ) 25 MG tablet Take 1 tablet (25 mg total) by mouth daily as needed. 90 tablet 3   HYDROcodone -acetaminophen  (NORCO/VICODIN) 5-325 MG tablet Take 1 tablet by mouth every 6 (six) hours as needed for moderate pain (pain score 4-6) or severe pain (pain score 7-10). 20 tablet 0   metFORMIN  (GLUCOPHAGE -XR) 500 MG 24 hr tablet Take 2 tablets (1,000 mg total) by mouth 2 (two) times daily with a meal. 360 tablet 3   methocarbamol  (ROBAXIN ) 750 MG tablet Take 1 tablet (  750 mg total) by mouth daily as needed for muscle spasms. 30 tablet 1   Multiple Vitamins-Minerals (ZINC  PO) Take 1 tablet by mouth daily.     Plecanatide  (TRULANCE ) 3 MG TABS Take 1 tablet by mouth once daily 90 tablet 3   polyethylene glycol (MIRALAX  / GLYCOLAX ) 17 g packet Take 17 g by mouth daily.     rosuvastatin  (CRESTOR ) 20 MG tablet Take 1 tablet (20 mg total) by mouth at bedtime. 90 tablet 3   sildenafil  (REVATIO ) 20 MG tablet Take 5 tablets prior to sexual activity 20 tablet 11   No facility-administered medications prior to visit.     Per HPI unless specifically indicated in ROS section below Review of Systems  Constitutional:  Negative for fatigue and fever.  HENT:  Negative for ear pain.   Eyes:  Negative for pain.   Respiratory:  Negative for cough and shortness of breath.   Cardiovascular:  Negative for chest pain, palpitations and leg swelling.  Gastrointestinal:  Negative for abdominal pain.  Genitourinary:  Negative for dysuria.  Musculoskeletal:  Negative for arthralgias.  Neurological:  Negative for syncope, light-headedness and headaches.  Psychiatric/Behavioral:  Negative for dysphoric mood.    Objective:  BP 108/80 (BP Location: Left Arm, Patient Position: Sitting, Cuff Size: Large)   Pulse 80   Temp 98.1 F (36.7 C) (Oral)   Ht 6' (1.829 m)   Wt (!) 311 lb 6 oz (141.2 kg)   SpO2 98%   BMI 42.23 kg/m   Wt Readings from Last 3 Encounters:  01/08/24 (!) 311 lb 6 oz (141.2 kg)  12/27/23 (!) 314 lb 12.8 oz (142.8 kg)  12/03/23 (!) 305 lb (138.3 kg)      Physical Exam Vitals reviewed.  Constitutional:      Appearance: He is well-developed.  HENT:     Head: Normocephalic.     Right Ear: Hearing normal.     Left Ear: Hearing normal.     Nose: Nose normal.  Neck:     Thyroid : No thyroid  mass or thyromegaly.     Vascular: No carotid bruit.     Trachea: Trachea normal.  Cardiovascular:     Rate and Rhythm: Normal rate and regular rhythm.     Pulses: Normal pulses.     Heart sounds: Heart sounds not distant. No murmur heard.    No friction rub. No gallop.     Comments: No peripheral edema Pulmonary:     Effort: Pulmonary effort is normal. No respiratory distress.     Breath sounds: Normal breath sounds.  Musculoskeletal:     Comments: See photo  Skin:    General: Skin is warm and dry.     Findings: No rash.  Psychiatric:        Speech: Speech normal.        Behavior: Behavior normal.        Thought Content: Thought content normal.       Results for orders placed or performed in visit on 12/20/23  Lipid panel   Collection Time: 12/20/23 10:12 AM  Result Value Ref Range   Cholesterol 145 0 - 200 mg/dL   Triglycerides 161.0 (H) 0.0 - 149.0 mg/dL   HDL 96.04 >54.09  mg/dL   VLDL 81.1 0.0 - 91.4 mg/dL   LDL Cholesterol 65 0 - 99 mg/dL   Total CHOL/HDL Ratio 3    NonHDL 99.09   Hemoglobin A1c   Collection Time: 12/20/23 10:12 AM  Result Value Ref  Range   Hgb A1c MFr Bld 8.7 (H) 4.6 - 6.5 %  Comprehensive metabolic panel   Collection Time: 12/20/23 10:12 AM  Result Value Ref Range   Sodium 139 135 - 145 mEq/L   Potassium 3.8 3.5 - 5.1 mEq/L   Chloride 99 96 - 112 mEq/L   CO2 30 19 - 32 mEq/L   Glucose, Bld 110 (H) 70 - 99 mg/dL   BUN 19 6 - 23 mg/dL   Creatinine, Ser 1.61 0.40 - 1.50 mg/dL   Total Bilirubin 0.9 0.2 - 1.2 mg/dL   Alkaline Phosphatase 59 39 - 117 U/L   AST 18 0 - 37 U/L   ALT 16 0 - 53 U/L   Total Protein 7.3 6.0 - 8.3 g/dL   Albumin 4.5 3.5 - 5.2 g/dL   GFR 09.60 >45.40 mL/min   Calcium  9.2 8.4 - 10.5 mg/dL    Assessment and Plan  Type 2 diabetes mellitus with neurological complications (HCC)  Puncture wound of left middle finger Assessment & Plan: Acute, with associated cellulitis.  No improvement with Augmentin  on day 5 out of 10 and doxycycline  day 5 out of 10 Still significant swelling and pain although gradually improving. Offered referral to hand specialist but patient would like to hold off at this time and give healing more time for recovery. I think this is reasonable but I am cautious given increased risk of bone infection complication given diabetes.  Return and ER precautions reviewed in detail.  Patient will let me know if redness, pain or swelling is worsening.  He will let me know if he has fever on antibiotics.     No follow-ups on file.   Herby Lolling, MD

## 2024-01-09 NOTE — Progress Notes (Signed)
  Subjective:  Patient ID: Jamie Finley, male    DOB: 1961-03-29,  MRN: [id number]  63 y.o. male presents at risk foot care with history of diabetic neuropathy and painful elongated mycotic toenails 1-5 bilaterally which are tender when wearing enclosed shoe gear. Pain is relieved with periodic professional debridement.  Chief Complaint  Patient presents with   Diabetes    "My toenails"   New problem(s): None   PCP is Judithann Novas, MD , and last visit was December 27, 2023.  No Known Allergies  Review of Systems: Negative except as noted in the HPI.   Objective:  Jamie Finley is a pleasant 63 y.o. male morbidly obese in NAD. AAO x 3.  Vascular Examination: Vascular status intact b/l with palpable pedal pulses. CFT immediate b/l. Pedal hair present. No edema. No pain with calf compression b/l. Skin temperature gradient WNL b/l. No varicosities noted. No cyanosis or clubbing noted.  Neurological Examination: Protective sensation diminished with 10g monofilament b/l.  Dermatological Examination: Pedal skin with normal turgor, texture and tone b/l. No open wounds nor interdigital macerations noted. Toenails 1-5 b/l thick, discolored, elongated with subungual debris and pain on dorsal palpation. No hyperkeratotic lesions noted b/l.   Musculoskeletal Examination: Muscle strength 5/5 to LLE. Utilizes motorized chair for mobility assistance.  Radiographs: None Last A1c:      Latest Ref Rng & Units 12/20/2023   10:12 AM 09/28/2023   12:02 PM 05/29/2023    3:12 PM 02/13/2023    1:57 PM  Hemoglobin A1C  Hemoglobin-A1c 4.6 - 6.5 % 8.7  11.1  10.6  7.4      Assessment:   1. Pain due to onychomycosis of toenails of both feet   2. Type 2 diabetes mellitus with neurological complications [facility])    Plan:  Patient was evaluated and treated. All patient's and/or POA's questions/concerns addressed on today's visit. Mycotic toenails 1-5 debrided in length and girth without incident.   Continue daily foot inspections and monitor blood glucose per PCP/Endocrinologist's recommendations.Continue soft, supportive shoe gear daily. Report any pedal injuries to medical professional. Call office if there are any quesitons/concerns. -Patient/POA to call should there be question/concern in the interim.  Return in about 3 months (around 04/03/2024).  Luella Sager, DPM      [place] LOCATION: [address] [place] [address]                   Office [phone]   [facility] LOCATION: [address] [place], [place] [address] Office [phone]

## 2024-01-10 ENCOUNTER — Ambulatory Visit: Payer: Self-pay

## 2024-01-10 ENCOUNTER — Other Ambulatory Visit: Payer: Self-pay

## 2024-01-10 VITALS — Wt 311.0 lb

## 2024-01-10 DIAGNOSIS — Z139 Encounter for screening, unspecified: Secondary | ICD-10-CM

## 2024-01-10 NOTE — Patient Outreach (Signed)
 Complex Care Management   Visit Note  01/10/2024  Name:  Jamie Finley MRN: [id number] DOB: 04-May-1961  Situation: Referral received for Complex Care Management related to  diabetes  I obtained verbal consent from Patient.  Visit completed with patient  on the phone  Background:   Past Medical History:  Diagnosis Date   Arthritis    Depression    Diabetes mellitus without complication (HCC)    Frequent headaches    Gout    Hyperlipidemia    Hypertension    Stroke [facility])     Assessment: Patient Reported Symptoms:  Cognitive Cognitive Status: Alert and oriented to person, place, and time, Insightful and able to interpret abstract concepts, Normal speech and language skills Cognitive/Intellectual Conditions Management [RPT]: None reported or documented in medical history or problem list   Health Maintenance Behaviors: Annual physical exam, Immunizations Healing Pattern: Average Health Facilitated by: Pain control  Neurological Neurological Review of Symptoms: No symptoms reported Neurological Conditions: Stroke, ischemic (status post 2 x back surgery) Neurological Management Strategies: Routine screening, Medication therapy, Diet modification Neurological Self-Management Outcome: 4 (good)  HEENT HEENT Symptoms Reported: Ear dryness, Tinnitus HEENT Conditions:  (none per patient) HEENT Management Strategies: Routine screening, Medication therapy HEENT Self-Management Outcome: 4 (good) HEENT Comment: patient states he has tinnitus for approximately 50 years.  States he was never seen and/ or treated for this. Patient states he is aware to follow up with provider if needed. Patient states he uses eye drops for dry eye.  (none per patient)  Cardiovascular Cardiovascular Symptoms Reported: Swelling in legs or feet (Patient reports mild swelling in LE which is his baseline) Does patient have uncontrolled Hypertension?: No Cardiovascular Conditions: Hypertension Cardiovascular  Management Strategies: Routine screening, Medication therapy, Diet modification Weight: (!) 311 lb (141.1 kg) Cardiovascular Self-Management Outcome: 4 (good) Cardiovascular Comment: Patient states he does not monitor blood pressure at home.  Respiratory Respiratory Symptoms Reported: No symptoms reported    Endocrine Patient reports the following symptoms related to hypoglycemia or hyperglycemia : No symptoms reported Is patient diabetic?: Yes Is patient checking blood sugars at home?: Yes Endocrine Conditions: Diabetes Endocrine Management Strategies: Diet modification, Routine screening, Medication therapy Endocrine Self-Management Outcome: 4 (good) Endocrine Comment: Patient states he has not checked his blood sugar in 2 weeks. He states he may check blood sugar several times per month. Per chart review patients most recent blood sugar on 12/20/23 was 8.7.  Gastrointestinal Gastrointestinal Symptoms Reported: Constipation Gastrointestinal Conditions: Constipation Gastrointestinal Management Strategies: Medication therapy (routine follow up with provider.) Gastrointestinal Comment: patient reports mild/ occasional constipation.  Takes miralax  as needed.    Genitourinary Genitourinary Symptoms Reported: No symptoms reported    Integumentary Integumentary Symptoms Reported: Skin changes, Wound Additional Integumentary Details: left middle finger nail gun injury Skin Conditions: Other Other Skin Conditions: dry skin Skin Management Strategies: Medication therapy, Routine screening Skin Self-Management Outcome: 4 (good) Skin Comment: patient states he has dry skin on legs and arms and uses prescribed lotion.  Patient states he's had 2 follow up visit with his primary care provider regarding left middle finger injury.  He states he is taking 2 antibiotics prescribed provider.  Denies any signs of infection to middle finger and reports pain level is a 2.  Musculoskeletal Musculoskelatal  Symptoms Reviewed: Difficulty walking, Unsteady gait, Weakness Additional Musculoskeletal Details: patient states he is wheelchair bound. Musculoskeletal Conditions: Back pain, Mobility limited Musculoskeletal Management Strategies: Routine screening Musculoskeletal Self-Management Outcome: 4 (good) Musculoskeletal Comment: Patient states it is getting  more difficult for him to manage cleaning in his home and request resource assistance. Patient reports sustaing fall approximately 3-4 weeks ago and injured left wrist. He denies fracture and states he has been advised to wear brace. Falls in the past year?: Yes Number of falls in past year: 2 or more Was there an injury with Fall?: Yes Fall Risk Category Calculator: 3 Patient Fall Risk Level: High Fall Risk Patient at Risk for Falls Due to: History of fall(s), Impaired balance/gait, Impaired mobility Fall risk Follow up: Falls prevention discussed  Psychosocial Psychosocial Symptoms Reported: Anxiety - if selected complete GAD, Depression - if selected complete PHQ 2-9 Behavioral Health Conditions: Anxiety, Depression Behavioral Health Comment: Patient reports having anxiety / depression symptoms. He reports being alone alot and not having a strong support system. patient reports having some personal issues over the last 2 weeks that have been stressful.  Patient agreeable to social work referral Techniques to [employer] with Loss/Stress/Change: None Quality of Family Relationships:  (patient states has very few relationships.) Do you feel physically threatened by others?: No      01/10/2024   11:03 AM  Depression screen PHQ 2/9  Decreased Interest 2  Down, Depressed, Hopeless 3  PHQ - 2 Score 5  Altered sleeping 3  Tired, decreased energy 2  Change in appetite 2  Feeling bad or failure about yourself  3  Trouble concentrating 2  Moving slowly or fidgety/restless 1  Suicidal thoughts 2  PHQ-9 Score 20  Difficult doing work/chores Very  difficult      01/10/2024   11:08 AM 01/03/2024   10:12 AM 05/29/2023    3:04 PM 02/20/2023   11:37 AM  GAD 7 : Generalized Anxiety Score  Nervous, Anxious, on Edge 2 1 1 1   Control/stop worrying 1 1 0 1  Worry too much - different things 1 2 0 1  Trouble relaxing 3 3 2 3   Restless 2 2 2 2   Easily annoyed or irritable 1 1 1  0  Afraid - awful might happen 2 2 1  0  Total GAD 7 Score 12 12 7 8   Anxiety Difficulty  Very difficult Somewhat difficult Somewhat difficult     There were no vitals filed for this visit.  Medications Reviewed Today     Reviewed by Dave Mannes E, RN (Registered Nurse) on 01/10/24 at 1038  Med List Status: <None>   Medication Order Taking? Sig Documenting Provider Last Dose Status Informant  Accu-Chek Softclix Lancets lancets [id number]  Use to check fasting blood sugar daily Bedsole, Amy E, MD  Active   ammonium lactate  (AMLACTIN) 12 % lotion [id number] Yes Apply 1 Application topically as needed for dry skin. Velma Ghazi, DPM Taking Active   amoxicillin -clavulanate (AUGMENTIN ) 875-125 MG tablet [id number] Yes Take 1 tablet by mouth 2 (two) times daily. Judithann Novas, MD Taking Active   aspirin  81 MG tablet [id number] Yes Take 81 mg by mouth daily. Ulysees Gander, MD Taking Active Self  blood glucose meter kit and supplies [id number]  Dispense based on patient and insurance preference. Use to check blood sugar two times a day. Bedsole, Amy E, MD  Active   Blood Glucose Monitoring Suppl (ACCU-CHEK GUIDE) w/Device KIT [id number]  Use to check blood fasting blood sugar daily Bedsole, Amy E, MD  Active   Cholecalciferol (VITAMIN D3) 50 MCG (2000 UT) TABS [id number] Yes Take 5-7 tablets by mouth daily. [provider] Taking Active   colchicine  0.6 MG tablet  [id number] Yes Take 1 tablet (0.6 mg total) by mouth daily as needed. Judithann Novas, MD Taking Active   diclofenac  Sodium (VOLTAREN ) 1 % GEL [id number] Yes Apply topically 4 (four) times daily. [provider] Taking Active Self  doxycycline  (VIBRA -TABS) 100 MG tablet [id number] Yes Take 1 tablet (100 mg total) by mouth 2 (two) times daily. Judithann Novas, MD Taking Active   empagliflozin  (JARDIANCE ) 10 MG TABS tablet [id number] Yes Take 1 tablet (10 mg total) by mouth daily before breakfast. Cherlyn Cornet, Amy E, MD Taking Active   fluorometholone (FML) 0.1 % ophthalmic suspension [id number] Yes Place 1 drop into both eyes 4 (four) times daily. [provider] Taking Active            Med Note Marrie Sizer, Zarion Oliff E   Thu Jan 10, 2024 10:36 AM) Patient states he takes as needed.   glipiZIDE  (GLUCOTROL  XL) 10 MG 24 hr tablet [id number] Yes Take 1 tablet (10 mg total) by mouth daily with breakfast. Cherlyn Cornet, Amy E, MD Taking Active   glucose blood (ACCU-CHEK GUIDE) test strip [id number]  Use to check blood fasting blood sugar daily Bedsole, Amy E, MD  Active   hydrochlorothiazide  (HYDRODIURIL ) 25 MG tablet [id number] Yes Take 1 tablet (25 mg total) by mouth daily as needed. Judithann Novas, MD Taking Active            Med Note Marrie Sizer, Aadyn Buchheit E   Thu Jan 10, 2024 10:36 AM) Patient states he takes daily  HYDROcodone -acetaminophen  (NORCO/VICODIN) 5-325 MG tablet [id number] Yes Take 1 tablet by mouth every 6 (six) hours as needed for moderate pain (pain score 4-6) or severe pain (pain score 7-10). Judithann Novas, MD Taking Active   metFORMIN  (GLUCOPHAGE -XR) 500 MG 24 hr tablet [id number] Yes Take 2 tablets (1,000 mg total) by mouth 2 (two) times daily with a meal. Bedsole, Amy E, MD Taking Active   methocarbamol  (ROBAXIN ) 750 MG tablet [id number] Yes Take 1 tablet (750 mg total) by mouth daily as needed for muscle spasms. Judithann Novas, MD Taking Active   Multiple Vitamins-Minerals (ZINC  PO) [id number] No Take 1 tablet by mouth daily.  Patient not taking: Reported on 01/10/2024   [provider] Not Taking Active   Plecanatide  (TRULANCE ) 3 MG TABS [id number] Yes Take 1 tablet by mouth once daily Anna,  Kiran, MD Taking Active   polyethylene glycol (MIRALAX  / GLYCOLAX ) 17 g packet [id number] Yes Take 17 g by mouth daily. [provider] Taking Active   rosuvastatin  (CRESTOR ) 20 MG tablet [id number] Yes Take 1 tablet (20 mg total) by mouth at bedtime. Judithann Novas, MD Taking Active   sildenafil  (REVATIO ) 20 MG tablet [id number] Yes Take 5 tablets prior to sexual activity Judithann Novas, MD Taking Active            Med Note Marrie Sizer, Caramia Boutin E   Fri Dec 07, 2023  9:22 AM) Patient states not taking.             Recommendation:   PCP Follow-up  Follow Up Plan:   Telephone follow-up in 1 month with RN case manager  Verba Girt RN, BSN, CCM [place]  [facility], Population Health Case Manager Phone: [phone]

## 2024-01-10 NOTE — Patient Instructions (Signed)
 Visit Information  Thank you for taking time to visit with me today. Please don't hesitate to contact me if I can be of assistance to you before our next scheduled appointment.  Our next appointment is by telephone on 02/14/24 at 10 am Please call the care guide team at [phone] if you need to cancel or reschedule your appointment.   Following is a copy of your care plan:   Goals Addressed             This Visit's Progress    VBCI RN Care Plan- diabetes       Problems:  Chronic Disease Management support and education needs related to [facility] Financial Constraints. Lacks caregiver support. Anxiety/ depression  Goal: Over the next 3 months the Patient will attend all scheduled medical appointments: with providers as evidenced by patient report / chart review.         continue to work with Medical illustrator and/or Social Worker to address care management and care coordination needs related to DMII as evidenced by adherence to care management team scheduled appointments     demonstrate Ongoing adherence to prescribed treatment plan for DMII as evidenced by patient report/ chart review  take all medications exactly as prescribed and will call provider for medication related questions as evidenced by patient report/ chart review     Interventions:   Diabetes Interventions: Assessed patient's understanding of A1c goal: <7% Reviewed medications with patient and discussed importance of medication adherence Discussed plans with patient for ongoing care management follow up and provided patient with direct contact information for care management team Reviewed scheduled/upcoming provider appointments including:   Referral made to social work team for assistance with anxiety/ depression, food resources/ in home care assistance. Assessed social determinant of health barriers Advised to monitor blood sugars daily and record. Discussed importance of monitoring blood sugars daily Advised to  notify provider for frequent blood sugars of <70 or >250. Reviewed Rule of 15 for hypoglycemic management Lab Results  Component Value Date   HGBA1C 8.7 (H) 12/20/2023    Patient Self-Care Activities:  Attend all scheduled provider appointments Call pharmacy for medication refills 3-7 days in advance of running out of medications Call provider office for new concerns or questions  Take medications as prescribed   check blood sugar at prescribed times: at least 1-2 times per day Notify provider for frequent blood sugars <70 or >250.  Plan:  Telephone follow up appointment with care management team member scheduled for:  02/14/24 at 10 am          VBCI RN Care Plan- falls          Please call the Suicide and Crisis Lifeline: 988 call 1-800-273-TALK (toll free, 24 hour hotline) if you are experiencing a Mental Health or Behavioral Health Crisis or need someone to talk to.  Patient verbalizes understanding of instructions and care plan provided today and agrees to view in MyChart. Active MyChart status and patient understanding of how to access instructions and care plan via MyChart confirmed with patient.     Verba Girt RN, BSN, CCM [employer], Population Health Case Manager Phone: [phone]

## 2024-01-18 ENCOUNTER — Telehealth: Payer: Self-pay | Admitting: *Deleted

## 2024-01-18 NOTE — Progress Notes (Unsigned)
 Complex Care Management Note Care Guide Note  01/18/2024 Name: Jamie Finley MRN: [id number] DOB: [birth year]   Complex Care Management Outreach Attempts: An unsuccessful telephone outreach was attempted today to offer the patient information about available complex care management services.  Follow Up Plan:  Additional outreach attempts will be made to offer the patient complex care management information and services.   Encounter Outcome:  No Answer  Kandis Ormond, CMA   [facility], [facility] Guide Direct Dial: [phone]  Fax: [phone] Website: [email or web address]

## 2024-01-21 NOTE — Progress Notes (Unsigned)
 Complex Care Management Note Care Guide Note  01/21/2024 Name: Jamie Finley MRN: [id number] DOB: 06-17-1961   Complex Care Management Outreach Attempts: A second unsuccessful outreach was attempted today to offer the patient with information about available complex care management services.  Follow Up Plan:  Additional outreach attempts will be made to offer the patient complex care management information and services.   Encounter Outcome:  No Answer  Kandis Ormond, CMA [place]  [facility], [facility] Guide Direct Dial: [phone]  Fax: [phone] Website: [email or web address]

## 2024-01-23 NOTE — Progress Notes (Signed)
 Complex Care Management Note Care Guide Note  01/23/2024 Name: Jamie Finley MRN: [id number] DOB: 07-25-1961   Complex Care Management Outreach Attempts: A third unsuccessful outreach was attempted today to offer the patient with information about available complex care management services.  Follow Up Plan:  No further outreach attempts will be made at this time. We have been unable to contact the patient to offer or enroll patient in complex care management services.  Encounter Outcome:  No Answer  Kandis Ormond, CMA [place]  [facility], [facility] Guide Direct Dial: [phone]  Fax: [phone] Website: [email or web address]

## 2024-02-14 ENCOUNTER — Other Ambulatory Visit: Payer: Self-pay

## 2024-02-14 DIAGNOSIS — F3289 Other specified depressive episodes: Secondary | ICD-10-CM

## 2024-02-14 DIAGNOSIS — Z139 Encounter for screening, unspecified: Secondary | ICD-10-CM

## 2024-02-14 NOTE — Patient Instructions (Signed)
 Visit Information  Thank you for taking time to visit with me today. Please don't hesitate to contact me if I can be of assistance to you before our next scheduled appointment.  Your next care management appointment is by telephone on 03/17/24 at 10am  Telephone follow-up in 1 month with Michele Ahle, RN case manager  Please call the care guide team at [phone] if you need to cancel, schedule, or reschedule an appointment.   Please call the Suicide and Crisis Lifeline: 988 call 1-800-273-TALK (toll free, 24 hour hotline) if you are experiencing a Mental Health or Behavioral Health Crisis or need someone to talk to.  Verba Girt RN, BSN, CCM [employer], Population Health Case Manager Phone: [phone]

## 2024-02-14 NOTE — Patient Outreach (Signed)
 Complex Care Management   Visit Note  02/14/2024  Name:  Jamie Finley MRN: [id number] DOB: 1961-08-12  Situation: Referral received for Complex Care Management related to diabetes I obtained verbal consent from Patient.  Visit completed with patient  on the phone  Background:   Past Medical History:  Diagnosis Date   Arthritis    Depression    Diabetes mellitus without complication (HCC)    Frequent headaches    Gout    Hyperlipidemia    Hypertension    Stroke [facility])     Assessment: Patient Reported Symptoms:  Cognitive Cognitive Status: Alert and oriented to person, place, and time, Insightful and able to interpret abstract concepts, Normal speech and language skills      Neurological Neurological Review of Symptoms: No symptoms reported    HEENT HEENT Symptoms Reported: No symptoms reported HEENT Comment: Patient states he is scheduled for yearly eye exam 03/2024.  Patient unable to remember exact date due to not having calendar present at time of call.    Cardiovascular Cardiovascular Symptoms Reported: No symptoms reported Cardiovascular Conditions: Hypertension Cardiovascular Management Strategies: Routine screening, Medication therapy, Diet modification Cardiovascular Self-Management Outcome: 4 (good)  Respiratory Respiratory Symptoms Reported: No symptoms reported    Endocrine Patient reports the following symptoms related to hypoglycemia or hyperglycemia : Increased thirst (patient states he is always thirst.  He states he eats ice throughtout the day.) Is patient diabetic?: Yes Is patient checking blood sugars at home?: No Endocrine Conditions: Diabetes Endocrine Management Strategies: Routine screening, Medication therapy, Medical device, Diet modification Endocrine Comment: Patient states he has gotten into a rut regarding checking his blood sugars. He states he has not checked his blood sugars since last call with RN case manager.  He states he was checking his  blood sugars after he ate however has not in the last few weeks. Patient states, " I have felt fine." Patient states he continues to take his diabetes medication as prescribed.  Per chart review next follow up visit scheduled with primary care provider is 03/27/24  Gastrointestinal Gastrointestinal Symptoms Reported: No symptoms reported      Genitourinary Genitourinary Symptoms Reported: No symptoms reported    Integumentary Integumentary Symptoms Reported: Other Other Integumentary Symptoms: left middle finger nail gun injury.  Patient states his finger has healed. Denies any further pain and/ or swelling.  Patient states he completed both antibiotics prescribed by his primary care provider.    Musculoskeletal Musculoskelatal Symptoms Reviewed: Difficulty walking, Unsteady gait, Weakness Additional Musculoskeletal Details: patient is wheelchair bound. Musculoskeletal Conditions: Other Other Musculoskeletal Conditions: patient reports having a gout flare up in his right elbow that started today.  He states he has an ongoing prescription of Cholchicine that he uses for flare ups.  Patient reports starting cholchicine today.   Patient reports pain level in his right elbow is a 2. Musculoskeletal Management Strategies: Routine screening, Medication therapy, Medical device Musculoskeletal Comment: Patient reports sustaining another fall in the past week. He states he fell in his bathroom.  Patient states he was attempting to get off of his wheelchair and lost his balance.  He states he caught himself with his hand and re-injured his wrist.  He reports wearing his wrist brace for a few days and states the pain / soreness is gone and he has full range of motion in his wrist. Falls in the past year?: Yes Number of falls in past year: 2 or more Was there an injury with Fall?: Yes Fall Risk  Category Calculator: 3 Patient Fall Risk Level: High Fall Risk Patient at Risk for Falls Due to: History of fall(s),  Impaired mobility, Impaired balance/gait Fall risk Follow up: Falls prevention discussed  Psychosocial Psychosocial Symptoms Reported: Not assessed            01/10/2024   11:03 AM  Depression screen PHQ 2/9  Decreased Interest 2  Down, Depressed, Hopeless 3  PHQ - 2 Score 5  Altered sleeping 3  Tired, decreased energy 2  Change in appetite 2  Feeling bad or failure about yourself  3  Trouble concentrating 2  Moving slowly or fidgety/restless 1  Suicidal thoughts 2  PHQ-9 Score 20  Difficult doing work/chores Very difficult    There were no vitals filed for this visit.  Medications Reviewed Today     Reviewed by Jakyron Fabro E, RN (Registered Nurse) on 02/14/24 at 1016  Med List Status: <None>   Medication Order Taking? Sig Documenting Provider Last Dose Status Informant  Accu-Chek Softclix Lancets lancets [id number]  Use to check fasting blood sugar daily Bedsole, Amy E, MD  Active   ammonium lactate  (AMLACTIN) 12 % lotion [id number] Yes Apply 1 Application topically as needed for dry skin. Velma Ghazi, DPM Taking Active   amoxicillin -clavulanate (AUGMENTIN ) 875-125 MG tablet [id number] No Take 1 tablet by mouth 2 (two) times daily.  Patient not taking: Reported on 02/14/2024   Judithann Novas, MD Not Taking Active   aspirin  81 MG tablet [id number] Yes Take 81 mg by mouth daily. Ulysees Gander, MD Taking Active Self  blood glucose meter kit and supplies [id number]  Dispense based on patient and insurance preference. Use to check blood sugar two times a day. Bedsole, Amy E, MD  Active   Blood Glucose Monitoring Suppl (ACCU-CHEK GUIDE) w/Device KIT [id number]  Use to check blood fasting blood sugar daily Bedsole, Amy E, MD  Active   Cholecalciferol (VITAMIN D3) 50 MCG (2000 UT) TABS [id number] Yes Take 5-7 tablets by mouth daily. [provider] Taking Active   colchicine  0.6 MG tablet [id number] Yes Take 1 tablet (0.6 mg total) by mouth daily as needed. Judithann Novas, MD  Taking Active   diclofenac  Sodium (VOLTAREN ) 1 % GEL [id number] Yes Apply topically 4 (four) times daily. [provider] Taking Active Self  doxycycline  (VIBRA -TABS) 100 MG tablet [id number] No Take 1 tablet (100 mg total) by mouth 2 (two) times daily.  Patient not taking: Reported on 02/14/2024   Judithann Novas, MD Not Taking Active   empagliflozin  (JARDIANCE ) 10 MG TABS tablet [id number] Yes Take 1 tablet (10 mg total) by mouth daily before breakfast. Cherlyn Cornet, Amy E, MD Taking Active   fluorometholone (FML) 0.1 % ophthalmic suspension [id number] Yes Place 1 drop into both eyes 4 (four) times daily. [provider] Taking Active            Med Note Marrie Sizer, Gracia Saggese E   Thu Jan 10, 2024 10:36 AM) Patient states he takes as needed.   glipiZIDE  (GLUCOTROL  XL) 10 MG 24 hr tablet [id number] Yes Take 1 tablet (10 mg total) by mouth daily with breakfast. Cherlyn Cornet, Amy E, MD Taking Active   glucose blood (ACCU-CHEK GUIDE) test strip [id number]  Use to check blood fasting blood sugar daily Bedsole, Amy E, MD  Active   hydrochlorothiazide  (HYDRODIURIL ) 25 MG tablet [id number] Yes Take 1 tablet (25 mg total) by mouth daily as needed. Judithann Novas, MD Taking Active  Med Note Marrie Sizer, Berthe Oley E   Thu Jan 10, 2024 10:36 AM) Patient states he takes daily  HYDROcodone -acetaminophen  (NORCO/VICODIN) 5-325 MG tablet [id number] Yes Take 1 tablet by mouth every 6 (six) hours as needed for moderate pain (pain score 4-6) or severe pain (pain score 7-10). Judithann Novas, MD Taking Active   metFORMIN  (GLUCOPHAGE -XR) 500 MG 24 hr tablet [id number] Yes Take 2 tablets (1,000 mg total) by mouth 2 (two) times daily with a meal. Bedsole, Amy E, MD Taking Active   methocarbamol  (ROBAXIN ) 750 MG tablet [id number] No Take 1 tablet (750 mg total) by mouth daily as needed for muscle spasms.  Patient not taking: Reported on 02/14/2024   Judithann Novas, MD Not Taking Active   Multiple Vitamins-Minerals (ZINC  PO) [id number]  No Take 1 tablet by mouth daily.  Patient not taking: Reported on 02/14/2024   [provider] Not Taking Active   Plecanatide  (TRULANCE ) 3 MG TABS [id number] Yes Take 1 tablet by mouth once daily Anna, Kiran, MD Taking Active   polyethylene glycol (MIRALAX  / GLYCOLAX ) 17 g packet [id number] Yes Take 17 g by mouth daily. [provider] Taking Active   rosuvastatin  (CRESTOR ) 20 MG tablet [id number] Yes Take 1 tablet (20 mg total) by mouth at bedtime. Judithann Novas, MD Taking Active   sildenafil  (REVATIO ) 20 MG tablet [id number] No Take 5 tablets prior to sexual activity  Patient not taking: Reported on 02/14/2024   Judithann Novas, MD Not Taking Active            Med Note Marrie Sizer, Karleen Seebeck E   Fri Dec 07, 2023  9:22 AM) Patient states not taking.             Recommendation:   PCP Follow-up Referral to: home health for home PT safety evaluation due to frequent falls- message sent to primary care provider requesting.  Referral to social worker to assist with in home care assistance concerns/ anxiety depression Follow Up Plan:   Telephone follow-up in 1 month. Patient transferring to Michele Ahle, RN for ongoing case management follow up. Patient agreeable to transfer and ongoing follow up.   Verba Girt RN, BSN, CCM [employer], Population Health Case Manager Phone: [phone]

## 2024-02-18 ENCOUNTER — Encounter: Payer: Self-pay | Admitting: Family Medicine

## 2024-02-20 ENCOUNTER — Ambulatory Visit: Admitting: Family Medicine

## 2024-02-20 ENCOUNTER — Encounter: Payer: Self-pay | Admitting: Family Medicine

## 2024-02-20 VITALS — BP 136/64 | HR 84 | Temp 98.6°F

## 2024-02-20 DIAGNOSIS — L0231 Cutaneous abscess of buttock: Secondary | ICD-10-CM

## 2024-02-20 DIAGNOSIS — L03317 Cellulitis of buttock: Secondary | ICD-10-CM

## 2024-02-20 DIAGNOSIS — E1149 Type 2 diabetes mellitus with other diabetic neurological complication: Secondary | ICD-10-CM | POA: Diagnosis not present

## 2024-02-20 LAB — CBC WITH DIFFERENTIAL/PLATELET
Absolute Lymphocytes: 2542 {cells}/uL (ref 850–3900)
Absolute Monocytes: 1190 {cells}/uL — ABNORMAL HIGH (ref 200–950)
Basophils Absolute: 74 {cells}/uL (ref 0–200)
Basophils Relative: 0.6 %
Eosinophils Absolute: 112 {cells}/uL (ref 15–500)
Eosinophils Relative: 0.9 %
HCT: 45.4 % (ref 38.5–50.0)
Hemoglobin: 15.2 g/dL (ref 13.2–17.1)
MCH: 27.7 pg (ref 27.0–33.0)
MCHC: 33.5 g/dL (ref 32.0–36.0)
MCV: 82.8 fL (ref 80.0–100.0)
MPV: 9.9 fL (ref 7.5–12.5)
Monocytes Relative: 9.6 %
Neutro Abs: 8482 {cells}/uL — ABNORMAL HIGH (ref 1500–7800)
Neutrophils Relative %: 68.4 %
Platelets: 283 10*3/uL (ref 140–400)
RBC: 5.48 10*6/uL (ref 4.20–5.80)
RDW: 14.4 % (ref 11.0–15.0)
Total Lymphocyte: 20.5 %
WBC: 12.4 10*3/uL — ABNORMAL HIGH (ref 3.8–10.8)

## 2024-02-20 MED ORDER — DOXYCYCLINE HYCLATE 100 MG PO TABS
100.0000 mg | ORAL_TABLET | Freq: Two times a day (BID) | ORAL | 0 refills | Status: DC
Start: 2024-02-20 — End: 2024-03-20

## 2024-02-20 MED ORDER — CEFTRIAXONE SODIUM 1 G IJ SOLR
1.0000 g | Freq: Once | INTRAMUSCULAR | Status: AC
  Administered 2024-02-20: 1 g via INTRAMUSCULAR

## 2024-02-20 MED ORDER — CHLORHEXIDINE GLUCONATE 0.12 % MT SOLN: 15.0000 mL | Freq: Two times a day (BID) | OROMUCOSAL | 0 refills | Status: DC

## 2024-02-20 MED ORDER — MUPIROCIN 2 % EX OINT: 1.0000 | TOPICAL_OINTMENT | Freq: Two times a day (BID) | CUTANEOUS | 0 refills | Status: DC

## 2024-02-20 NOTE — Progress Notes (Signed)
 Patient ID: Jamie Finley, male    DOB: 1960-10-11, 63 y.o.   MRN: [id number]  This visit was conducted in person.  BP 136/64   Pulse 84   Temp 98.6 F (37 C) (Oral)   SpO2 93%    CC:  Chief Complaint  Patient presents with   Blister    Patient states that he has a boil at the top of his butt. Last night it did drain but his concern is MRSA. He has been using antibacterial Dial soap    Subjective:   HPI: Jamie Finley is a 63 y.o. male presenting on 02/20/2024 for Blister (Patient states that he has a boil at the top of his butt. Last night it did drain but his concern is MRSA. He has been using antibacterial Dial soap)   New onset skin lesion at top of  buttock crease 5 days, increasing in size and pain. Sore to touch, increase. Last night drained.. bloody pus  No fever    Restarted dial soap.   Took one tablet of doxycycline  100 mg x 1. Leftover. Using ibuprofen  400 mg prn.   Has been feeing tired, nausea   2023 Hx of MRSA       Relevant past medical, surgical, family and social history reviewed and updated as indicated. Interim medical history since our last visit reviewed. Allergies and medications reviewed and updated. Outpatient Medications Prior to Visit  Medication Sig Dispense Refill   Accu-Chek Softclix Lancets lancets Use to check fasting blood sugar daily 100 each 3   ammonium lactate  (AMLACTIN) 12 % lotion Apply 1 Application topically as needed for dry skin. 400 g 0   amoxicillin -clavulanate (AUGMENTIN ) 875-125 MG tablet Take 1 tablet by mouth 2 (two) times daily. 20 tablet 0   aspirin  81 MG tablet Take 81 mg by mouth daily.     blood glucose meter kit and supplies Dispense based on patient and insurance preference. Use to check blood sugar two times a day. 1 each 0   Blood Glucose Monitoring Suppl (ACCU-CHEK GUIDE) w/Device KIT Use to check blood fasting blood sugar daily 1 kit 0   Cholecalciferol (VITAMIN D3) 50 MCG (2000 UT) TABS Take 5-7  tablets by mouth daily.     colchicine  0.6 MG tablet Take 1 tablet (0.6 mg total) by mouth daily as needed. 30 tablet 2   diclofenac  Sodium (VOLTAREN ) 1 % GEL Apply topically 4 (four) times daily.     empagliflozin  (JARDIANCE ) 10 MG TABS tablet Take 1 tablet (10 mg total) by mouth daily before breakfast. 30 tablet 11   fluorometholone (FML) 0.1 % ophthalmic suspension Place 1 drop into both eyes 4 (four) times daily.     glipiZIDE  (GLUCOTROL  XL) 10 MG 24 hr tablet Take 1 tablet (10 mg total) by mouth daily with breakfast. 30 tablet 11   glucose blood (ACCU-CHEK GUIDE) test strip Use to check blood fasting blood sugar daily 100 each 3   hydrochlorothiazide  (HYDRODIURIL ) 25 MG tablet Take 1 tablet (25 mg total) by mouth daily as needed. 90 tablet 3   HYDROcodone -acetaminophen  (NORCO/VICODIN) 5-325 MG tablet Take 1 tablet by mouth every 6 (six) hours as needed for moderate pain (pain score 4-6) or severe pain (pain score 7-10). 20 tablet 0   metFORMIN  (GLUCOPHAGE -XR) 500 MG 24 hr tablet Take 2 tablets (1,000 mg total) by mouth 2 (two) times daily with a meal. 360 tablet 3   methocarbamol  (ROBAXIN ) 750 MG tablet Take 1 tablet (750 mg  total) by mouth daily as needed for muscle spasms. 30 tablet 1   Multiple Vitamins-Minerals (ZINC  PO) Take 1 tablet by mouth daily.     Plecanatide  (TRULANCE ) 3 MG TABS Take 1 tablet by mouth once daily 90 tablet 3   polyethylene glycol (MIRALAX  / GLYCOLAX ) 17 g packet Take 17 g by mouth daily.     rosuvastatin  (CRESTOR ) 20 MG tablet Take 1 tablet (20 mg total) by mouth at bedtime. 90 tablet 3   sildenafil  (REVATIO ) 20 MG tablet Take 5 tablets prior to sexual activity 20 tablet 11   doxycycline  (VIBRA -TABS) 100 MG tablet Take 1 tablet (100 mg total) by mouth 2 (two) times daily. 20 tablet 0   No facility-administered medications prior to visit.     Per HPI unless specifically indicated in ROS section below Review of Systems  Constitutional:  Negative for fatigue and  fever.  HENT:  Negative for ear pain.   Eyes:  Negative for pain.  Respiratory:  Negative for cough and shortness of breath.   Cardiovascular:  Negative for chest pain, palpitations and leg swelling.  Gastrointestinal:  Negative for abdominal pain.  Genitourinary:  Negative for dysuria.  Musculoskeletal:  Negative for arthralgias.  Neurological:  Negative for syncope, light-headedness and headaches.  Psychiatric/Behavioral:  Negative for dysphoric mood.    Objective:  BP 136/64   Pulse 84   Temp 98.6 F (37 C) (Oral)   SpO2 93%   Wt Readings from Last 3 Encounters:  01/10/24 (!) 311 lb (141.1 kg)  01/08/24 (!) 311 lb 6 oz (141.2 kg)  12/27/23 (!) 314 lb 12.8 oz (142.8 kg)      Physical Exam Vitals reviewed.  Constitutional:      Appearance: He is well-developed.  HENT:     Head: Normocephalic.     Right Ear: Hearing normal.     Left Ear: Hearing normal.     Nose: Nose normal.  Neck:     Thyroid : No thyroid  mass or thyromegaly.     Vascular: No carotid bruit.     Trachea: Trachea normal.  Cardiovascular:     Rate and Rhythm: Normal rate and regular rhythm.     Pulses: Normal pulses.     Heart sounds: Heart sounds not distant. No murmur heard.    No friction rub. No gallop.     Comments: No peripheral edema Pulmonary:     Effort: Pulmonary effort is normal. No respiratory distress.     Breath sounds: Normal breath sounds.  Skin:    General: Skin is warm and dry.     Findings: Lesion present. No rash.     Comments:  See photo  Psychiatric:        Speech: Speech normal.        Behavior: Behavior normal.        Thought Content: Thought content normal.       Start doxycycline  Results for orders placed or performed in visit on 12/20/23  Lipid panel   Collection Time: 12/20/23 10:12 AM  Result Value Ref Range   Cholesterol 145 0 - 200 mg/dL   Triglycerides 161.0 (H) 0.0 - 149.0 mg/dL   HDL 96.04 >54.09 mg/dL   VLDL 81.1 0.0 - 91.4 mg/dL   LDL Cholesterol 65 0 -  99 mg/dL   Total CHOL/HDL Ratio 3    NonHDL 99.09   Hemoglobin A1c   Collection Time: 12/20/23 10:12 AM  Result Value Ref Range   Hgb A1c MFr Bld 8.7 (  H) 4.6 - 6.5 %  Comprehensive metabolic panel   Collection Time: 12/20/23 10:12 AM  Result Value Ref Range   Sodium 139 135 - 145 mEq/L   Potassium 3.8 3.5 - 5.1 mEq/L   Chloride 99 96 - 112 mEq/L   CO2 30 19 - 32 mEq/L   Glucose, Bld 110 (H) 70 - 99 mg/dL   BUN 19 6 - 23 mg/dL   Creatinine, Ser 1.61 0.40 - 1.50 mg/dL   Total Bilirubin 0.9 0.2 - 1.2 mg/dL   Alkaline Phosphatase 59 39 - 117 U/L   AST 18 0 - 37 U/L   ALT 16 0 - 53 U/L   Total Protein 7.3 6.0 - 8.3 g/dL   Albumin 4.5 3.5 - 5.2 g/dL   GFR 09.60 >45.40 mL/min   Calcium  9.2 8.4 - 10.5 mg/dL    Assessment and Plan  Cellulitis and abscess of buttock Assessment & Plan:  Acute, concern for MRSA abscess.  Area of fluctuance remains despite partial draining at home. Recommended incision and drainage, but patient would like to avoid this at all if possible given pain in the area. Also recommended consideration of visit to general surgery for incision and drainage at their office.  He has decided to . treat conservatively with close follow-up in 24 hours.  Start warm compresses.  3 times daily. Start doxycycline  100 mg tonight and take twice daily for 10 days. Go to ER if fever on antibiotics or unable to keep them down. Close follow-up tomorrow for reevaluation of abscess.  Return and ER precautions reviewed in detail.    Given recurrent infection... after current infection treated.. will recommend decolonization: Nasal decolonization with mupirocin ointment (2%) applied to nares twice daily for 5 to 10 days, and ?Topical body decolonization (one of the following): Chlorhexidine gluconate (2% or 4% solution): daily washes or use of a disposable impregnated cloth for 5 to 14 days, or Dilute bleach baths (one teaspoon bleach per gallon of water , or one-fourth cup bleach  per one-fourth tub approximately 13 gallons of water  for 15 minutes twice weekly) for approximately three months  Orders: -     CBC with Differential/Platelet  Type 2 diabetes mellitus with neurological complications (HCC) Assessment & Plan: Chronic, follow blood sugars closely while ongoing infection.   Other orders -     Doxycycline  Hyclate; Take 1 tablet (100 mg total) by mouth 2 (two) times daily.  Dispense: 20 tablet; Refill: 0 -     Mupirocin; Apply 1 Application topically 2 (two) times daily.  Dispense: 15 g; Refill: 0 -     Chlorhexidine Gluconate; Use as directed 15 mLs in the mouth or throat 2 (two) times daily.  Dispense: 120 mL; Refill: 0    Return in about 1 day (around 02/21/2024).   Herby Lolling, MD

## 2024-02-20 NOTE — Assessment & Plan Note (Signed)
 Chronic, follow blood sugars closely while ongoing infection.

## 2024-02-20 NOTE — Assessment & Plan Note (Signed)
 Acute, concern for MRSA abscess.  Area of fluctuance remains despite partial draining at home. Recommended incision and drainage, but patient would like to avoid this at all if possible given pain in the area. Also recommended consideration of visit to general surgery for incision and drainage at their office.  He has decided to . treat conservatively with close follow-up in 24 hours.  Start warm compresses.  3 times daily. Start doxycycline  100 mg tonight and take twice daily for 10 days. Go to ER if fever on antibiotics or unable to keep them down. Close follow-up tomorrow for reevaluation of abscess.  Return and ER precautions reviewed in detail.    Given recurrent infection... after current infection treated.. will recommend decolonization: Nasal decolonization with mupirocin ointment (2%) applied to nares twice daily for 5 to 10 days, and ?Topical body decolonization (one of the following): Chlorhexidine gluconate (2% or 4% solution): daily washes or use of a disposable impregnated cloth for 5 to 14 days, or Dilute bleach baths (one teaspoon bleach per gallon of water , or one-fourth cup bleach per one-fourth tub approximately 13 gallons of water  for 15 minutes twice weekly) for approximately three months

## 2024-02-20 NOTE — Addendum Note (Signed)
 Addended by: Eller Gut on: 02/20/2024 01:16 PM   Modules accepted: Orders

## 2024-02-20 NOTE — Patient Instructions (Addendum)
 Please stop at the lab to have labs drawn.  Start warm compresses.  3 times daily. Start doxycycline  100 mg tonight and take twice daily for 10 days. Go to ER if fever on antibiotics or unable to keep them down. Close follow-up tomorrow for reevaluation of abscess.     Given recurrent infection... after current infection treated.. will recommend decolonization: Nasal decolonization with mupirocin ointment (2%) applied to nares twice daily for 5 to 10 days, and ?Topical body decolonization (one of the following): Chlorhexidine gluconate (2% or 4% solution): daily washes or use of a disposable impregnated cloth for 5 to 14 days, or Dilute bleach baths (one teaspoon bleach per gallon of water , or one-fourth cup bleach per one-fourth tub approximately 13 gallons of water  for 15 minutes twice weekly) for approximately three months

## 2024-02-21 ENCOUNTER — Ambulatory Visit: Payer: Self-pay | Admitting: Family Medicine

## 2024-02-21 ENCOUNTER — Encounter: Payer: Self-pay | Admitting: Family Medicine

## 2024-02-21 ENCOUNTER — Ambulatory Visit: Admitting: Family Medicine

## 2024-02-21 VITALS — BP 110/80 | HR 64 | Temp 97.8°F

## 2024-02-21 DIAGNOSIS — L0231 Cutaneous abscess of buttock: Secondary | ICD-10-CM

## 2024-02-21 DIAGNOSIS — L03317 Cellulitis of buttock: Secondary | ICD-10-CM | POA: Diagnosis not present

## 2024-02-21 DIAGNOSIS — F331 Major depressive disorder, recurrent, moderate: Secondary | ICD-10-CM | POA: Diagnosis not present

## 2024-02-21 DIAGNOSIS — E1149 Type 2 diabetes mellitus with other diabetic neurological complication: Secondary | ICD-10-CM

## 2024-02-21 MED ORDER — BUPROPION HCL ER (XL) 150 MG PO TB24
150.0000 mg | ORAL_TABLET | Freq: Every day | ORAL | 5 refills | Status: AC
Start: 2024-02-21 — End: ?

## 2024-02-21 MED ORDER — CHLORHEXIDINE GLUCONATE 4 % EX SOLN: Freq: Every day | CUTANEOUS | 1 refills | Status: DC | PRN

## 2024-02-21 MED ORDER — CEFTRIAXONE SODIUM 1 G IJ SOLR
1.0000 g | Freq: Once | INTRAMUSCULAR | Status: AC
  Administered 2024-02-21: 1 g via INTRAMUSCULAR

## 2024-02-21 NOTE — Progress Notes (Signed)
 Patient ID: Jamie Finley, male    DOB: 1960/10/21, 63 y.o.   MRN: [id number]  This visit was conducted in person.  BP 110/80 (BP Location: Left Arm, Patient Position: Sitting, Cuff Size: Large)   Pulse 64   Temp 97.8 F (36.6 C) (Oral)   SpO2 98%    CC:  Chief Complaint  Patient presents with   cellulitis    Subjective:   HPI: Jamie Finley is a 63 y.o. male presenting on 02/21/2024 for cellulitis   1 day follow up on cellulitis/abscess buttocks  Now on doxycycline  day 2, doing warm compresses as able.  Rocephin  1 g x 1  He has been  trying to do warm compresses.   Today he reports  he may have noted further drainage while waiting in office.  Pain the same as yesterday, not worse.  No fever  Feeling better energy.  No N/V    MDD, chronic, acute worsening      03/20/2024    4:24 PM 03/17/2024   10:55 AM 02/20/2024   12:22 PM  PHQ9 SCORE ONLY  PHQ-9 Total Score 10 1 22       03/20/2024    4:25 PM 02/20/2024   12:22 PM 01/10/2024   11:08 AM 01/03/2024   10:12 AM  GAD 7 : Generalized Anxiety Score  Nervous, Anxious, on Edge 1 1 2 1   Control/stop worrying 1 3 1 1   Worry too much - different things 1 3 1 2   Trouble relaxing 3 3 3 3   Restless 1 3 2 2   Easily annoyed or irritable 0 2 1 1   Afraid - awful might happen 0 2 2 2   Total GAD 7 Score 7 17 12 12   Anxiety Difficulty Somewhat difficult Very difficult  Very difficult      Relevant past medical, surgical, family and social history reviewed and updated as indicated. Interim medical history since our last visit reviewed. Allergies and medications reviewed and updated. Outpatient Medications Prior to Visit  Medication Sig Dispense Refill   Accu-Chek Softclix Lancets lancets Use to check fasting blood sugar daily 100 each 3   ammonium lactate  (AMLACTIN) 12 % lotion Apply 1 Application topically as needed for dry skin. 400 g 0   aspirin  81 MG tablet Take 81 mg by mouth daily.     blood glucose meter kit and  supplies Dispense based on patient and insurance preference. Use to check blood sugar two times a day. 1 each 0   Blood Glucose Monitoring Suppl (ACCU-CHEK GUIDE) w/Device KIT Use to check blood fasting blood sugar daily 1 kit 0   Cholecalciferol (VITAMIN D3) 50 MCG (2000 UT) TABS Take 5-7 tablets by mouth daily.     colchicine  0.6 MG tablet Take 1 tablet (0.6 mg total) by mouth daily as needed. 30 tablet 2   diclofenac  Sodium (VOLTAREN ) 1 % GEL Apply topically 4 (four) times daily.     empagliflozin  (JARDIANCE ) 10 MG TABS tablet Take 1 tablet (10 mg total) by mouth daily before breakfast. 30 tablet 11   fluorometholone (FML) 0.1 % ophthalmic suspension Place 1 drop into both eyes 4 (four) times daily.     glipiZIDE  (GLUCOTROL  XL) 10 MG 24 hr tablet Take 1 tablet (10 mg total) by mouth daily with breakfast. 30 tablet 11   glucose blood (ACCU-CHEK GUIDE) test strip Use to check blood fasting blood sugar daily 100 each 3   hydrochlorothiazide  (HYDRODIURIL ) 25 MG tablet Take 1 tablet (25 mg total)  by mouth daily as needed. 90 tablet 3   metFORMIN  (GLUCOPHAGE -XR) 500 MG 24 hr tablet Take 2 tablets (1,000 mg total) by mouth 2 (two) times daily with a meal. 360 tablet 3   methocarbamol  (ROBAXIN ) 750 MG tablet Take 1 tablet (750 mg total) by mouth daily as needed for muscle spasms. 30 tablet 1   mupirocin  ointment (BACTROBAN ) 2 % Apply 1 Application topically 2 (two) times daily. 15 g 0   Plecanatide  (TRULANCE ) 3 MG TABS Take 1 tablet by mouth once daily 90 tablet 3   polyethylene glycol (MIRALAX  / GLYCOLAX ) 17 g packet Take 17 g by mouth daily.     rosuvastatin  (CRESTOR ) 20 MG tablet Take 1 tablet (20 mg total) by mouth at bedtime. 90 tablet 3   sildenafil  (REVATIO ) 20 MG tablet Take 5 tablets prior to sexual activity 20 tablet 11   chlorhexidine  (PERIDEX ) 0.12 % solution Use as directed 15 mLs in the mouth or throat 2 (two) times daily. 120 mL 0   doxycycline  (VIBRA -TABS) 100 MG tablet Take 1 tablet  (100 mg total) by mouth 2 (two) times daily. (Patient not taking: Reported on 03/17/2024) 20 tablet 0   Multiple Vitamins-Minerals (ZINC  PO) Take 1 tablet by mouth daily. (Patient not taking: Reported on 03/17/2024)     HYDROcodone -acetaminophen  (NORCO/VICODIN) 5-325 MG tablet Take 1 tablet by mouth every 6 (six) hours as needed for moderate pain (pain score 4-6) or severe pain (pain score 7-10). 20 tablet 0   amoxicillin -clavulanate (AUGMENTIN ) 875-125 MG tablet Take 1 tablet by mouth 2 (two) times daily. (Patient not taking: Reported on 02/21/2024) 20 tablet 0   No facility-administered medications prior to visit.     Per HPI unless specifically indicated in ROS section below Review of Systems  Constitutional:  Negative for fatigue and fever.  HENT:  Negative for ear pain.   Eyes:  Negative for pain.  Respiratory:  Negative for cough and shortness of breath.   Cardiovascular:  Negative for chest pain, palpitations and leg swelling.  Gastrointestinal:  Negative for abdominal pain.  Genitourinary:  Negative for dysuria.  Musculoskeletal:  Negative for arthralgias.  Neurological:  Negative for syncope, light-headedness and headaches.  Psychiatric/Behavioral:  Negative for dysphoric mood.    Objective:  BP 110/80 (BP Location: Left Arm, Patient Position: Sitting, Cuff Size: Large)   Pulse 64   Temp 97.8 F (36.6 C) (Oral)   SpO2 98%   Wt Readings from Last 3 Encounters:  03/20/24 (!) 315 lb 2 oz (142.9 kg)  01/10/24 (!) 311 lb (141.1 kg)  01/08/24 (!) 311 lb 6 oz (141.2 kg)      Physical Exam Constitutional:      Appearance: He is well-developed.  HENT:     Head: Normocephalic.     Right Ear: Hearing normal.     Left Ear: Hearing normal.     Nose: Nose normal.  Neck:     Thyroid : No thyroid  mass or thyromegaly.     Vascular: No carotid bruit.     Trachea: Trachea normal.  Cardiovascular:     Rate and Rhythm: Normal rate and regular rhythm.     Pulses: Normal pulses.     Heart  sounds: Heart sounds not distant. No murmur heard.    No friction rub. No gallop.     Comments: No peripheral edema Pulmonary:     Effort: Pulmonary effort is normal. No respiratory distress.     Breath sounds: Normal breath sounds.  Skin:  General: Skin is warm and dry.     Findings: No rash.         Comments: Large abscess at left upper buttock gluteal crease with surrounding redness, central fluctuance  Psychiatric:        Speech: Speech normal.        Behavior: Behavior normal.        Thought Content: Thought content normal.       Results for orders placed or performed in visit on 02/21/24  WOUND CULTURE   Collection Time: 02/21/24  4:56 PM   Specimen: Wound  Result Value Ref Range   MICRO NUMBER: [id number]    SPECIMEN QUALITY: Adequate    SOURCE: NOT GIVEN    STATUS: FINAL    GRAM STAIN:      Rare epithelial cells Rare White blood cells seen No organisms seen   RESULT: No Growth    COMMENT:      No source was provided. The specimen was tested and reported based upon the test code ordered. If this is incorrect, please contact client services.    Assessment and Plan I&D  Meds, vitals, and allergies reviewed.   Indication: suspect abscess  Pt complaints of: erythema, pain, swelling  Location:gluteal crease  Size:6 cm  Informed consent obtained.  Pt aware of risks not limited to but including infection, bleeding, damage to near by organs.  Prep: etoh/betadine  Anesthesia: 1%lidocaine  with epi, good effect  Incision made with #11 blade  Wound explored and loculations removed  Wound packed with iodoform gauze  Tolerated well  Routine postprocedure instructions d/w pt- remove packing in 24-48h, keep area clean and bandaged, follow up if concerns/spreading erythema/pain.  MDD (major depressive disorder), recurrent episode, moderate (HCC) Assessment & Plan: Acute worsening, patient having significant issues with mood despite counseling.  Will initiate  Wellbutrin  with follow-up in 4 weeks.   Cellulitis and abscess of buttock Assessment & Plan: Acute, minimal improvement with antibiotics alone.  Indication for incision and drainage.  This was performed with no complications.  Wound culture sent.  Injection of Rocephin  1 g given in office.  He will continue warm compresses/sitz bath's.  Complete antibiotic course. Follow-up within 48 hours if pain increasing redness spreading, go to ER if fever on antibiotics.  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   Other orders -     buPROPion  HCl ER (XL); Take 1 tablet (150 mg total) by mouth daily.  Dispense: 30 tablet; Refill: 5 -     Chlorhexidine  Gluconate; Apply topically daily as needed (wash body. Do not use  oral med, prescription was incorrect.).  Dispense: 236 mL; Refill: 1    Return in about 4 weeks (around 03/20/2024) for follow up mood.   Greig Ring, MD

## 2024-02-25 LAB — WOUND CULTURE
MICRO NUMBER:: 16572897
RESULT:: NO GROWTH
SPECIMEN QUALITY:: ADEQUATE

## 2024-02-28 ENCOUNTER — Ambulatory Visit: Payer: Self-pay | Admitting: Family Medicine

## 2024-02-29 ENCOUNTER — Telehealth: Payer: Self-pay

## 2024-02-29 NOTE — Progress Notes (Signed)
 Complex Care Management Note Care Guide Note  02/29/2024 Name: Jamie Finley MRN: [id number] DOB: [birth year]   Complex Care Management Outreach Attempts: An unsuccessful telephone outreach was attempted today to offer the patient information about available complex care management services.  Follow Up Plan:  Additional outreach attempts will be made to offer the patient complex care management information and services.   Encounter Outcome:  No Answer  [facility]  [facility], [facility] Health Care Management Assistant Direct Dial: [phone]  Fax: [phone]

## 2024-03-06 DIAGNOSIS — H16233 Neurotrophic keratoconjunctivitis, bilateral: Secondary | ICD-10-CM | POA: Diagnosis not present

## 2024-03-06 DIAGNOSIS — D3132 Benign neoplasm of left choroid: Secondary | ICD-10-CM | POA: Diagnosis not present

## 2024-03-06 DIAGNOSIS — H35033 Hypertensive retinopathy, bilateral: Secondary | ICD-10-CM | POA: Diagnosis not present

## 2024-03-06 DIAGNOSIS — E119 Type 2 diabetes mellitus without complications: Secondary | ICD-10-CM | POA: Diagnosis not present

## 2024-03-06 DIAGNOSIS — H2513 Age-related nuclear cataract, bilateral: Secondary | ICD-10-CM | POA: Diagnosis not present

## 2024-03-06 DIAGNOSIS — H524 Presbyopia: Secondary | ICD-10-CM | POA: Diagnosis not present

## 2024-03-06 DIAGNOSIS — I10 Essential (primary) hypertension: Secondary | ICD-10-CM | POA: Diagnosis not present

## 2024-03-06 LAB — HM DIABETES EYE EXAM

## 2024-03-10 NOTE — Progress Notes (Unsigned)
 Complex Care Management Note Care Guide Note  03/10/2024 Name: Jamie Finley MRN: [id number] DOB: Dec 21, 1960   Complex Care Management Outreach Attempts: A second unsuccessful outreach was attempted today to offer the patient with information about available complex care management services.  Follow Up Plan:  Additional outreach attempts will be made to offer the patient complex care management information and services.   Encounter Outcome:  No Answer  [facility]  [facility], [facility] Care Management Assistant Direct Dial: [phone]  Fax: [phone]

## 2024-03-11 NOTE — Progress Notes (Signed)
 Complex Care Management Note  Care Guide Note 03/11/2024 Name: Jamie Finley MRN: [id number] DOB: 21-Dec-1960  Jamie Finley is a 63 y.o. year old male who sees Avelina Greig BRAVO, MD for primary care. I reached out to [name] by phone today to offer complex care management services.  Jamie Finley was given information about Complex Care Management services today including:   The Complex Care Management services include support from the care team which includes your Nurse Care Manager, Clinical Social Worker, or Pharmacist.  The Complex Care Management team is here to help remove barriers to the health concerns and goals most important to you. Complex Care Management services are voluntary, and the patient may decline or stop services at any time by request to their care team member.   Complex Care Management Consent Status: Patient agreed to services and verbal consent obtained.   Follow up plan:  Telephone appointment with complex care management team member scheduled for:  03/18/24 & 03/26/24.  Encounter Outcome:  Patient Scheduled  [facility]  [facility], [facility] Health Care Management Assistant Direct Dial: [phone]  Fax: [phone]

## 2024-03-17 ENCOUNTER — Other Ambulatory Visit: Payer: Self-pay

## 2024-03-17 NOTE — Patient Instructions (Signed)
 Visit Information  Thank you for taking time to visit with me today. Please don't hesitate to contact me if I can be of assistance to you before our next scheduled appointment.  Your next care management appointment is by telephone on 04/14/2024 at 11:00 am  Telephone follow-up in 1 month  Please call the care guide team at [phone] if you need to cancel, schedule, or reschedule an appointment.   Please call the Suicide and Crisis Lifeline: 988 call the [place]  National Suicide Prevention Lifeline: 806-700-3924 or TTY: 959-111-8560 TTY 340-152-6644) to talk to a trained counselor call 1-800-273-TALK (toll free, 24 hour hotline) go to [facility] [address] [place] [phone]) call 911 if you are experiencing a Mental Health or Behavioral Health Crisis or need someone to talk to.  Nestora Duos, MSN, RN [place]  [facility], [facility] RN Care Manager Direct Dial: [phone] Fax: [phone]   Fall Prevention in the Home, Adult Falls can cause injuries and can happen to people of all ages. There are many things you can do to make your home safer and to help prevent falls. What actions can I take to prevent falls? General information Use good lighting in all rooms. Make sure to: Replace any light bulbs that burn out. Turn on the lights in dark areas and use night-lights. Keep items that you use often in easy-to-reach places. Lower the shelves around your home if needed. Move furniture so that there are clear paths around it. Do not use throw rugs or other things on the floor that can make you trip. If any of your floors are uneven, fix them. Add color or contrast paint or tape to clearly mark and help you see: Grab bars or handrails. First and last steps of staircases. Where the edge of each step is. If you use a ladder or stepladder: Make sure that it is fully opened. Do not climb a closed  ladder. Make sure the sides of the ladder are locked in place. Have someone hold the ladder while you use it. Know where your pets are as you move through your home. What can I do in the bathroom?     Keep the floor dry. Clean up any water  on the floor right away. Remove soap buildup in the bathtub or shower. Buildup makes bathtubs and showers slippery. Use non-skid mats or decals on the floor of the bathtub or shower. Attach bath mats securely with double-sided, non-slip rug tape. If you need to sit down in the shower, use a non-slip stool. Install grab bars by the toilet and in the bathtub and shower. Do not use towel bars as grab bars. What can I do in the bedroom? Make sure that you have a light by your bed that is easy to reach. Do not use any sheets or blankets on your bed that hang to the floor. Have a firm chair or bench with side arms that you can use for support when you get dressed. What can I do in the kitchen? Clean up any spills right away. If you need to reach something above you, use a step stool with a grab bar. Keep electrical cords out of the way. Do not use floor polish or wax that makes floors slippery. What can I do with my stairs? Do not leave anything on the stairs. Make sure that you have a light switch at the top and the bottom of the stairs. Make sure that there are  handrails on both sides of the stairs. Fix handrails that are broken or loose. Install non-slip stair treads on all your stairs if they do not have carpet. Avoid having throw rugs at the top or bottom of the stairs. Choose a carpet that does not hide the edge of the steps on the stairs. Make sure that the carpet is firmly attached to the stairs. Fix carpet that is loose or worn. What can I do on the outside of my home? Use bright outdoor lighting. Fix the edges of walkways and driveways and fix any cracks. Clear paths of anything that can make you trip, such as tools or rocks. Add color or  contrast paint or tape to clearly mark and help you see anything that might make you trip as you walk through a door, such as a raised step or threshold. Trim any bushes or trees on paths to your home. Check to see if handrails are loose or broken and that both sides of all steps have handrails. Install guardrails along the edges of any raised decks and porches. Have leaves, snow, or ice cleared regularly. Use sand, salt, or ice melter on paths if you live where there is ice and snow during the winter. Clean up any spills in your garage right away. This includes grease or oil spills. What other actions can I take? Review your medicines with your doctor. Some medicines can cause dizziness or changes in blood pressure, which increase your risk of falling. Wear shoes that: Have a low heel. Do not wear high heels. Have rubber bottoms and are closed at the toe. Feel good on your feet and fit well. Use tools that help you move around if needed. These include: Canes. Walkers. Scooters. Crutches. Ask your doctor what else you can do to help prevent falls. This may include seeing a physical therapist to learn to do exercises to move better and get stronger. Where to find more information Centers for Disease Control and Prevention, STEADI: TonerPromos.no [employer] on Aging: BaseRingTones.pl National Institute on Aging: BaseRingTones.pl Contact a doctor if: You are afraid of falling at home. You feel weak, drowsy, or dizzy at home. You fall at home. Get help right away if you: Lose consciousness or have trouble moving after a fall. Have a fall that causes a head injury. These symptoms may be an emergency. Get help right away. Call 911. Do not wait to see if the symptoms will go away. Do not drive yourself to the hospital. This information is not intended to replace advice given to you by your health care provider. Make sure you discuss any questions you have with your health care provider. Document  Revised: 05/01/2022 Document Reviewed: 05/01/2022 Elsevier Patient Education  2024 Elsevier Inc.   Diabetes Action Plan A diabetes action plan is a way for you to manage your symptoms of diabetes, also called diabetes mellitus. The plan is color-coded to guide you on what actions to take based on any symptoms you're having. If you have symptoms in the red zone, you need medical care right away. If you have symptoms in the yellow zone, your diabetes isn't under control, and you may need to make some changes. If you have symptoms in the green zone, you're doing well. Understanding diabetes can take time. Follow the treatment plan that you created with your health care provider. Know the target range for your blood sugar, also called glucose. Review your plan each time you visit your provider. The target range for  my blood sugar level is __________________________ mg/dL. Red zone Get medical help right away if you have any of the following symptoms: A blood sugar test result that's below 54 mg/dL (3 mmol/L). A blood sugar test result that's at or above 240 mg/dL (86.6 mmol/L) for 2 days in a row along with: Extreme thirst and frequent peeing. Confusion or trouble thinking clearly. Moderate or large ketone levels in your pee (urine). Feeling tired or having no energy. Trouble breathing. Sickness or a fever for 2 or more days that's not getting better. These symptoms may be an emergency. Call 911 right away. Do not wait to see if the symptoms will go away. Do not drive yourself to the hospital. If you have very low blood sugar, also called severe hypoglycemia, and you can't eat or drink, you may need glucagon. Make sure a family member or close friend knows how to check your blood sugar and how to give you glucagon. You may need to be treated in a hospital for this condition. Yellow zone If you have any of the following symptoms, your diabetes isn't under control, and you may need to make some  changes: A blood sugar test result that's at or above 240 mg/dL (86.6 mmol/L) for 2 days in a row. Blood sugar test results that are below 70 mg/dL (3.9 mmol/L). Other symptoms of hypoglycemia, such as: Shaking or feeling light-headed. Confusion or irritability. Feeling hungry. Having a fast heartbeat. If you have any yellow zone symptoms: Treat your hypoglycemia by eating or drinking 15 grams of a rapid-acting carbohydrate. Follow the 15:15 rule: Take 15 grams of a rapid-acting carbohydrate, such as: 1 tube of glucose gel. 4 glucose pills. 4 oz (120 mL) of fruit juice. 4 oz (120 mL) of regular (not diet) soda. Check your blood sugar again 15 minutes after you take the carbohydrate. If the second blood sugar test is still at or below 70 mg/dL (3.9 mmol/L), take 15 grams of a carbohydrate again. If your blood sugar doesn't increase above 70 mg/dL (3.9 mmol/L) after 3 tries, get medical help right away. After your blood sugar returns to normal, eat a meal or a snack within 1 hour. Keep taking your daily medicines as told by your provider. Check your blood sugar more often than you normally would. Write down your results. Call your provider if you have trouble keeping your blood sugar in your target range. Green zone These signs mean you're doing well and can continue what you're doing to manage your diabetes: Your blood sugar is within your personal target range. For most people, a blood sugar level before a meal should be 80-130 mg/dL (4.4-7.2 mmol/L). You feel well, and you're able to do daily activities. If you're in the green zone, continue to manage your diabetes as told by your provider. To do this: Eat a healthy diet. Exercise regularly. Check your blood sugar as told. Take your medicines only as told. Where to find more information American Diabetes Association (ADA): diabetes.org Association of Diabetes Care & Education Specialists (ADCES):  adces.org/diabetes-education-dsmes This information is not intended to replace advice given to you by your health care provider. Make sure you discuss any questions you have with your health care provider. Document Revised: 04/18/2023 Document Reviewed: 04/18/2023 Elsevier Patient Education  2024 Elsevier Inc.  Blood Glucose Monitoring, Adult To manage your diabetes, you'll need to keep track of your blood sugar. This is called blood glucose monitoring. Check your blood glucose as often as told. Keep a  journal of your results over time. This can help you: Know when to adjust your diabetes management plan with your health care provider. See how food, exercise, illness, and medicines affect your blood glucose. Know what your blood glucose is at any time. Your provider will set specific goals for your blood glucose levels. In many cases, these goals may be: Before meals: 80-130 mg/dL (4.4-7.2 mmol/L). After meals: below 180 mg/dL (10 mmol/L). A1C level: less than 7%. Supplies needed: Blood glucose meter. Test strips for your meter. Each brand of meter has its own strips. You must use the strips that came with your meter. A lancet. This is a sharp device used to poke your finger. Do not use a lancet more than once. A journal or logbook to write down your results. How to check your blood glucose Checking your blood glucose  Wash your hands with soap and water  for at least 20 seconds. Use the lancet to poke the side of your finger. Do not poke the tip of your finger. Also, try not to use the same finger each time. Gently squeeze the finger until a small drop of blood appears. Follow the meter instructions on how to insert the test strip, apply blood to the strip, and use the meter. Write down your result and any notes. Using different sites Some blood glucose meters allow testing on other parts of your body to test your blood. The most common places are the forearm, thigh, upper arm, and palm  of the hand. Check your meter's instructions. Using different sites may not be as accurate as your fingers. If you think you have low blood glucose, only use your finger. General tips Blood glucose log  Write down the result each time you check your blood glucose. Note anything that may be affecting your blood glucose. This can help you and your provider: Look for patterns over time. Adjust your management plan as needed. Check if your meter has an app or lets you download your records to a computer. Most meters keep a record of glucose readings in the meter. If you have type 1 diabetes: Check your blood glucose as often as told. This may be: Before each meal and snack. Two hours after a meal. Before bedtime. If you have symptoms of hypoglycemia. After treating your hypoglycemia. Before doing things that have a risk of injury, such as driving or using machinery. Before and after exercise. Between 2:00 a.m. and 3:00 a.m. You may need to check your blood glucose more often, such as up to 6-10 times a day, if: You have diabetes that is not well controlled. You are ill. You have a history of severe hypoglycemia. You have hypoglycemia unawareness. If you have type 2 diabetes: You may need to check your blood glucose 2 or more times a day. Check your blood glucose as often as told by your provider. This may include: Before and after exercise. Before doing things that have a risk of injury, such as driving or using machinery. You may need to check your blood glucose more often if: Your medicine is being adjusted. Your diabetes is not well controlled. You are ill. General tips Always have your blood glucose meter and supplies with you. After you use a few boxes of test strips, adjust your blood glucose meter as needed. Follow the meter instructions. If you have questions or need help, all blood glucose meters have a 24-hour hotline phone number that you can call. Also, contact your  provider with  any questions or concerns. Where to find more information The American Diabetes Association: diabetes.org The Association of Diabetes Care & Education Specialists: diabeteseducator.org Contact a health care provider if: Your blood glucose is at or above 240 mg/dL (86.6 mmol/L) for 2 days in a row. You have been sick or have had a fever for 2 days or longer and are not getting better. You have any of these problems for more than 6 hours: You cannot eat or drink. You have nausea or vomiting. You have diarrhea. Get help right away if: Your blood glucose is lower than 54 mg/dL (3 mmol/L). You become confused, or you have trouble thinking clearly. You have trouble breathing. You have moderate to high ketone levels in your pee. These symptoms may be an emergency. Get help right away. Call 911. Do not wait to see if the symptoms will go away. Do not drive yourself to the hospital. This information is not intended to replace advice given to you by your health care provider. Make sure you discuss any questions you have with your health care provider. Document Revised: 04/10/2023 Document Reviewed: 07/14/2022 Elsevier Patient Education  2024 [employer].

## 2024-03-18 ENCOUNTER — Other Ambulatory Visit: Payer: Self-pay

## 2024-03-18 NOTE — Patient Outreach (Signed)
 Complex Care Management   Visit Note  03/18/2024  Name:  Jamie Finley MRN: [id number] DOB: 1961/07/27  Situation: Referral received for Complex Care Management related to SDOH Barriers:  Food insecurity housekeeping I obtained verbal consent from Patient.  Visit completed with patient  on the phone  Background:   Past Medical History:  Diagnosis Date   Arthritis    Depression    Diabetes mellitus without complication (HCC)    Frequent headaches    Gout    Hyperlipidemia    Hypertension    Stroke [facility])     Assessment: SW completed a telephone outreach with patient, he states he is on limited disability 1600 monthly and needs assistance with food and someone to come and assist in the home with cleaning. Patient states he is able to drive to go to the food pantries. SW and patient agreed for resources to be mailed. SW will completed PCS form and fax to clinic.  SDOH Interventions    Flowsheet Row Patient Outreach from 03/18/2024 in [place] POPULATION HEALTH DEPARTMENT Patient Outreach Telephone from 03/17/2024 in [place] POPULATION HEALTH DEPARTMENT Care Coordination from 01/10/2024 in [place] POPULATION HEALTH DEPARTMENT Office Visit from 01/03/2024 in [facility] Clinical Support from 08/30/2023 in [facility] [place] [facility] [place] Telephone from 08/07/2023 in [facility] Care Coordination  SDOH Interventions        Food Insecurity Interventions Community Resources Provided Other (Comment)  [Referral pending BSW visit 03/18/24] Other (Comment)  Chief Operating Officer to Child psychotherapist for food resources] -- Intervention Not Indicated Intervention Not Indicated  Housing Interventions -- Intervention Not Indicated Intervention Not Indicated -- Intervention Not Indicated Intervention Not Indicated  Transportation Interventions -- Intervention Not Indicated Intervention Not Indicated -- Intervention Not Indicated Intervention Not  Indicated  Utilities Interventions -- Intervention Not Indicated Intervention Not Indicated -- Intervention Not Indicated Intervention Not Indicated  Alcohol Usage Interventions -- -- -- -- Intervention Not Indicated (Score <7) --  Depression Interventions/Treatment  -- -- --  [patient agreeable to referral with social worker.] Counseling -- --  Corporate treasurer Interventions [employer] Provided -- -- -- Intervention Not Indicated --  Physical Activity Interventions -- -- -- -- Intervention Not Indicated --  Stress Interventions -- -- -- -- Intervention Not Indicated --  Social Connections Interventions -- -- -- -- Intervention Not Indicated --  Health Literacy Interventions -- -- -- -- Intervention Not Indicated --    Recommendation:   No recommendations at this time.  Follow Up Plan:   Telephone follow-up on 04/01/24 at 11am  Thersia Hoar, BSW, [facility]  [facility] Social Worker, Population Health [phone]

## 2024-03-18 NOTE — Patient Instructions (Signed)
 Visit Information  Thank you for taking time to visit with me today. Please don't hesitate to contact me if I can be of assistance to you before our next scheduled appointment.  Our next appointment is by telephone on 04/01/24 at 11am Please call the care guide team at [phone] if you need to cancel or reschedule your appointment.   Following is a copy of your care plan:   Goals Addressed             This Visit's Progress    BSW VBCI Social Work Care Plan       Problems:   Food Insecurity  and Housekeeping  CSW Clinical Goal(s):   Over the next 30 days the Patient will work with Child psychotherapist to address concerns related to food resources and obtaining assistance in the home.  Interventions:  Social Determinants of Health in Patient with Food and housekeeping: SDOH assessments completed: Financial Strain , Food Insecurity , and housekeeping Evaluation of current treatment plan related to unmet needs SW will mail food resources to patient and complete [place] PCS form and fax to PCP.  Patient Goals/Self-Care Activities:  Patient will use resources provided for food.  Plan:   The care management team will reach out to the patient again over the next 15 days.        Please call the Suicide and Crisis Lifeline: 988 call the [place]  National Suicide Prevention Lifeline: 418-327-3349 or TTY: 7185718529 TTY (949)866-3814) to talk to a trained counselor call 1-800-273-TALK (toll free, 24 hour hotline) call 911 if you are experiencing a Mental Health or Behavioral Health Crisis or need someone to talk to.  Patient verbalizes understanding of instructions and care plan provided today and agrees to view in MyChart. Active MyChart status and patient understanding of how to access instructions and care plan via MyChart confirmed with patient.     Thersia Hoar, HEDWIG, MHA [place]  [facility] Social Worker, Population Health [phone]

## 2024-03-20 ENCOUNTER — Ambulatory Visit (INDEPENDENT_AMBULATORY_CARE_PROVIDER_SITE_OTHER): Admitting: Family Medicine

## 2024-03-20 ENCOUNTER — Encounter: Payer: Self-pay | Admitting: Family Medicine

## 2024-03-20 VITALS — BP 108/64 | HR 88 | Temp 98.6°F | Ht 72.0 in | Wt 315.1 lb

## 2024-03-20 DIAGNOSIS — F331 Major depressive disorder, recurrent, moderate: Secondary | ICD-10-CM | POA: Diagnosis not present

## 2024-03-20 NOTE — Progress Notes (Signed)
 Patient ID: Jamie Finley, male    DOB: [birth year], 62 y.o.   MRN: [id number]  This visit was conducted in person.  BP 108/64   Pulse 88   Temp 98.6 F (37 C) (Oral)   Ht 6' (1.829 m)   Wt (!) 315 lb 2 oz (142.9 kg)   SpO2 95%   BMI 42.74 kg/m    Chief Complaint  Patient presents with   Depression    Follow up med start     Subjective:   HPI: Jamie Finley is a 63 y.o. male presenting on 03/20/2024 for Depression (Follow up med start/)     MDD, chronic, acute worsening at last office visit 1 month ago patient was started on bupropion  150 mg daily  He reports improvement with mood. He has had more motivated, less extreme mood swings.  No Si.  Less anxious.  Still trouble sleep.  No SE to wellbutrin . He was also referred to complex care management for assistance with S DOH including financial strain, food insecurity and housekeeping.    03/20/2024    4:24 PM 03/17/2024   10:55 AM 02/20/2024   12:22 PM  PHQ9 SCORE ONLY  PHQ-9 Total Score 10 1 22       03/20/2024    4:25 PM 02/20/2024   12:22 PM 01/10/2024   11:08 AM 01/03/2024   10:12 AM  GAD 7 : Generalized Anxiety Score  Nervous, Anxious, on Edge 1 1 2 1   Control/stop worrying 1 3 1 1   Worry too much - different things 1 3 1 2   Trouble relaxing 3 3 3 3   Restless 1 3 2 2   Easily annoyed or irritable 0 2 1 1   Afraid - awful might happen 0 2 2 2   Total GAD 7 Score 7 17 12 12   Anxiety Difficulty Somewhat difficult Very difficult  Very difficult      Relevant past medical, surgical, family and social history reviewed and updated as indicated. Interim medical history since our last visit reviewed. Allergies and medications reviewed and updated. Outpatient Medications Prior to Visit  Medication Sig Dispense Refill   Accu-Chek Softclix Lancets lancets Use to check fasting blood sugar daily 100 each 3   ammonium lactate  (AMLACTIN) 12 % lotion Apply 1 Application topically as needed for dry skin. 400 g 0   aspirin   81 MG tablet Take 81 mg by mouth daily.     blood glucose meter kit and supplies Dispense based on patient and insurance preference. Use to check blood sugar two times a day. 1 each 0   Blood Glucose Monitoring Suppl (ACCU-CHEK GUIDE) w/Device KIT Use to check blood fasting blood sugar daily 1 kit 0   buPROPion  (WELLBUTRIN  XL) 150 MG 24 hr tablet Take 1 tablet (150 mg total) by mouth daily. 30 tablet 5   chlorhexidine  (HIBICLENS ) 4 % external liquid Apply topically daily as needed (wash body. Do not use  oral med, prescription was incorrect.). 236 mL 1   Cholecalciferol (VITAMIN D3) 50 MCG (2000 UT) TABS Take 5-7 tablets by mouth daily.     colchicine  0.6 MG tablet Take 1 tablet (0.6 mg total) by mouth daily as needed. 30 tablet 2   diclofenac  Sodium (VOLTAREN ) 1 % GEL Apply topically 4 (four) times daily.     empagliflozin  (JARDIANCE ) 10 MG TABS tablet Take 1 tablet (10 mg total) by mouth daily before breakfast. 30 tablet 11   fluorometholone (FML) 0.1 % ophthalmic suspension Place 1 drop into  both eyes 4 (four) times daily.     glipiZIDE  (GLUCOTROL  XL) 10 MG 24 hr tablet Take 1 tablet (10 mg total) by mouth daily with breakfast. 30 tablet 11   glucose blood (ACCU-CHEK GUIDE) test strip Use to check blood fasting blood sugar daily 100 each 3   hydrochlorothiazide  (HYDRODIURIL ) 25 MG tablet Take 1 tablet (25 mg total) by mouth daily as needed. 90 tablet 3   HYDROcodone -acetaminophen  (NORCO/VICODIN) 5-325 MG tablet Take 1 tablet by mouth every 6 (six) hours as needed for moderate pain (pain score 4-6) or severe pain (pain score 7-10). 20 tablet 0   metFORMIN  (GLUCOPHAGE -XR) 500 MG 24 hr tablet Take 2 tablets (1,000 mg total) by mouth 2 (two) times daily with a meal. 360 tablet 3   methocarbamol  (ROBAXIN ) 750 MG tablet Take 1 tablet (750 mg total) by mouth daily as needed for muscle spasms. 30 tablet 1   mupirocin  ointment (BACTROBAN ) 2 % Apply 1 Application topically 2 (two) times daily. 15 g 0    Plecanatide  (TRULANCE ) 3 MG TABS Take 1 tablet by mouth once daily 90 tablet 3   polyethylene glycol (MIRALAX  / GLYCOLAX ) 17 g packet Take 17 g by mouth daily.     rosuvastatin  (CRESTOR ) 20 MG tablet Take 1 tablet (20 mg total) by mouth at bedtime. 90 tablet 3   sildenafil  (REVATIO ) 20 MG tablet Take 5 tablets prior to sexual activity 20 tablet 11   amoxicillin -clavulanate (AUGMENTIN ) 875-125 MG tablet Take 1 tablet by mouth 2 (two) times daily. (Patient not taking: Reported on 02/21/2024) 20 tablet 0   doxycycline  (VIBRA -TABS) 100 MG tablet Take 1 tablet (100 mg total) by mouth 2 (two) times daily. (Patient not taking: Reported on 03/17/2024) 20 tablet 0   Multiple Vitamins-Minerals (ZINC  PO) Take 1 tablet by mouth daily. (Patient not taking: Reported on 03/17/2024)     No facility-administered medications prior to visit.     Per HPI unless specifically indicated in ROS section below Review of Systems  Constitutional:  Negative for fatigue and fever.  HENT:  Negative for ear pain.   Eyes:  Negative for pain.  Respiratory:  Negative for cough and shortness of breath.   Cardiovascular:  Negative for chest pain, palpitations and leg swelling.  Gastrointestinal:  Negative for abdominal pain.  Genitourinary:  Negative for dysuria.  Musculoskeletal:  Negative for arthralgias.  Neurological:  Negative for syncope, light-headedness and headaches.  Psychiatric/Behavioral:  Negative for dysphoric mood.    Objective:  BP 108/64   Pulse 88   Temp 98.6 F (37 C) (Oral)   Ht 6' (1.829 m)   Wt (!) 315 lb 2 oz (142.9 kg)   SpO2 95%   BMI 42.74 kg/m   Wt Readings from Last 3 Encounters:  03/20/24 (!) 315 lb 2 oz (142.9 kg)  01/10/24 (!) 311 lb (141.1 kg)  01/08/24 (!) 311 lb 6 oz (141.2 kg)      Physical Exam Vitals reviewed.  Constitutional:      Appearance: He is well-developed.  HENT:     Head: Normocephalic.     Right Ear: Hearing normal.     Left Ear: Hearing normal.     Nose: Nose  normal.  Neck:     Thyroid : No thyroid  mass or thyromegaly.     Vascular: No carotid bruit.     Trachea: Trachea normal.  Cardiovascular:     Rate and Rhythm: Normal rate and regular rhythm.     Pulses: Normal pulses.  Heart sounds: Heart sounds not distant. No murmur heard.    No friction rub. No gallop.     Comments: No peripheral edema Pulmonary:     Effort: Pulmonary effort is normal. No respiratory distress.     Breath sounds: Normal breath sounds.  Skin:    General: Skin is warm and dry.     Findings: No rash.  Psychiatric:        Speech: Speech normal.        Behavior: Behavior normal.        Thought Content: Thought content normal.       Results for orders placed or performed in visit on 03/17/24  HM DIABETES EYE EXAM   Collection Time: 03/06/24 11:06 AM  Result Value Ref Range   HM Diabetic Eye Exam No Retinopathy No Retinopathy    Assessment and Plan  MDD (major depressive disorder), recurrent episode, moderate (HCC) Assessment & Plan: Chronic, significant improvement with initiation of Wellbutrin .  No side effects.  Continue current dosing and work on stress reduction and relaxation techniques. If issues with sleep continuing we can discuss this at his upcoming follow-up.  Return and ER precautions provided.     No follow-ups on file.   Greig Ring, MD

## 2024-03-25 NOTE — Assessment & Plan Note (Signed)
 Acute, minimal improvement with antibiotics alone.  Indication for incision and drainage.  This was performed with no complications.  Wound culture sent.  Injection of Rocephin  1 g given in office.  He will continue warm compresses/sitz bath's.  Complete antibiotic course. Follow-up within 48 hours if pain increasing redness spreading, go to ER if fever on antibiotics.

## 2024-03-25 NOTE — Assessment & Plan Note (Signed)
 Acute worsening, patient having significant issues with mood despite counseling.  Will initiate Wellbutrin  with follow-up in 4 weeks.

## 2024-03-25 NOTE — Assessment & Plan Note (Signed)
 Chronic, complicating cellulitis.

## 2024-03-26 ENCOUNTER — Telehealth: Payer: Self-pay | Admitting: *Deleted

## 2024-03-26 DIAGNOSIS — H5203 Hypermetropia, bilateral: Secondary | ICD-10-CM | POA: Diagnosis not present

## 2024-03-26 NOTE — Assessment & Plan Note (Signed)
 Chronic, significant improvement with initiation of Wellbutrin .  No side effects.  Continue current dosing and work on stress reduction and relaxation techniques. If issues with sleep continuing we can discuss this at his upcoming follow-up.  Return and ER precautions provided.

## 2024-03-27 ENCOUNTER — Ambulatory Visit: Admitting: Family Medicine

## 2024-03-27 ENCOUNTER — Encounter (INDEPENDENT_AMBULATORY_CARE_PROVIDER_SITE_OTHER): Payer: Self-pay

## 2024-03-27 ENCOUNTER — Ambulatory Visit: Payer: Self-pay | Admitting: Family Medicine

## 2024-03-27 VITALS — BP 108/60 | HR 83 | Temp 97.8°F | Ht 72.0 in | Wt 315.5 lb

## 2024-03-27 DIAGNOSIS — Z6839 Body mass index (BMI) 39.0-39.9, adult: Secondary | ICD-10-CM

## 2024-03-27 DIAGNOSIS — E1159 Type 2 diabetes mellitus with other circulatory complications: Secondary | ICD-10-CM

## 2024-03-27 DIAGNOSIS — G8220 Paraplegia, unspecified: Secondary | ICD-10-CM

## 2024-03-27 DIAGNOSIS — E66812 Obesity, class 2: Secondary | ICD-10-CM | POA: Diagnosis not present

## 2024-03-27 DIAGNOSIS — E1149 Type 2 diabetes mellitus with other diabetic neurological complication: Secondary | ICD-10-CM | POA: Diagnosis not present

## 2024-03-27 DIAGNOSIS — Z7984 Long term (current) use of oral hypoglycemic drugs: Secondary | ICD-10-CM | POA: Diagnosis not present

## 2024-03-27 DIAGNOSIS — E1143 Type 2 diabetes mellitus with diabetic autonomic (poly)neuropathy: Secondary | ICD-10-CM

## 2024-03-27 DIAGNOSIS — I152 Hypertension secondary to endocrine disorders: Secondary | ICD-10-CM | POA: Diagnosis not present

## 2024-03-27 LAB — POCT GLYCOSYLATED HEMOGLOBIN (HGB A1C): Hemoglobin A1C: 7.8 % — AB (ref 4.0–5.6)

## 2024-03-27 LAB — MICROALBUMIN / CREATININE URINE RATIO
Creatinine,U: 63.4 mg/dL
Microalb Creat Ratio: UNDETERMINED mg/g (ref 0.0–30.0)
Microalb, Ur: <0.7 mg/dL

## 2024-03-27 NOTE — Assessment & Plan Note (Signed)
 Chronic,  limiting activity, but walking improved with bracing.

## 2024-03-27 NOTE — Assessment & Plan Note (Signed)
 Chronic, Associated with diabetes.

## 2024-03-27 NOTE — Assessment & Plan Note (Addendum)
 Chronic, improving control.  Now trying to be more active using rolling walker, continue to work on healthy low-carb diet. On maximum dose metformin  ER 1000 mg BID.  Back on glipizide  XL 10 mg p.o. daily with meal. Tolerating Jardiance  10 mg daily.  No current evidence of UTI or perineal infection. Given blood sugars trending down significantly will hold off on increasing dose given concern for bladder issues with history of neurogenic bladder... per pt request. Reevaluate A1c in 3 months.

## 2024-03-27 NOTE — Assessment & Plan Note (Addendum)
 Limited in activity given  Disability from back   Keep up the great on diet change, increase activity.   SE to GLP1 meds.. severe constipation.   Refer to medical bariatric weight loss program.

## 2024-03-27 NOTE — Assessment & Plan Note (Signed)
Stable, chronic.  Continue current medication.   HCTZ 25 mg p.o. daily as needed swelling

## 2024-03-27 NOTE — Progress Notes (Signed)
 Patient ID: Jamie Finley, male    DOB: 1961/03/12, 63 y.o.   MRN: [id number]  This visit was conducted in person.  BP 108/60   Pulse 83   Temp 97.8 F (36.6 C) (Temporal)   Ht 6' (1.829 m)   Wt (!) 315 lb 8 oz (143.1 kg)   SpO2 97%   BMI 42.79 kg/m    CC:  Chief Complaint  Patient presents with   Diabetes    Subjective:   HPI: Jamie Finley is a 63 y.o. male presenting on 03/27/2024 for Diabetes  Diabetes:  Continued improvement! Currently on Jardiance  10 mg daily Glipizide  XL 10 mg daily Metformin  ER 1000 mg twice daily, max. GLP-1 caused severe constipation. Also did not tolerate Rybelsus  DUE for  microalbumin. Lab Results  Component Value Date   HGBA1C 7.8 (A) 03/27/2024  In the past he has tolerated glipizide , Actos  has caused peripheral swelling  Was on insulin  in past. Using medications without difficulties: Hypoglycemic episodes: none Hyperglycemic episodes: yes .SABRA 262 after meals. Feet problems: no ulcers. Blood Sugars averaging: 103-159 fasting most days. eye exam within last year: yes Urine microalbumin collected today   Wt Readings from Last 3 Encounters:  03/27/24 (!) 315 lb 8 oz (143.1 kg)  03/20/24 (!) 315 lb 2 oz (142.9 kg)  01/10/24 (!) 311 lb (141.1 kg)   Hypertension:   HCTZ 25 mg p.o. daily as needed swelling BP Readings from Last 3 Encounters:  03/27/24 108/60  03/20/24 108/64  02/21/24 110/80  Using medication without problems or lightheadedness:  Chest pain with exertion: Edema: Short of breath: Average home BPs: Other issues:    Relevant past medical, surgical, family and social history reviewed and updated as indicated. Interim medical history since our last visit reviewed. Allergies and medications reviewed and updated. Outpatient Medications Prior to Visit  Medication Sig Dispense Refill   Accu-Chek Softclix Lancets lancets Use to check fasting blood sugar daily 100 each 3   ammonium lactate  (AMLACTIN) 12 %  lotion Apply 1 Application topically as needed for dry skin. 400 g 0   aspirin  81 MG tablet Take 81 mg by mouth daily.     blood glucose meter kit and supplies Dispense based on patient and insurance preference. Use to check blood sugar two times a day. 1 each 0   Blood Glucose Monitoring Suppl (ACCU-CHEK GUIDE) w/Device KIT Use to check blood fasting blood sugar daily 1 kit 0   buPROPion  (WELLBUTRIN  XL) 150 MG 24 hr tablet Take 1 tablet (150 mg total) by mouth daily. 30 tablet 5   chlorhexidine  (HIBICLENS ) 4 % external liquid Apply topically daily as needed (wash body. Do not use  oral med, prescription was incorrect.). 236 mL 1   Cholecalciferol (VITAMIN D3) 50 MCG (2000 UT) TABS Take 5-7 tablets by mouth daily.     colchicine  0.6 MG tablet Take 1 tablet (0.6 mg total) by mouth daily as needed. 30 tablet 2   diclofenac  Sodium (VOLTAREN ) 1 % GEL Apply topically 4 (four) times daily.     empagliflozin  (JARDIANCE ) 10 MG TABS tablet Take 1 tablet (10 mg total) by mouth daily before breakfast. 30 tablet 11   fluorometholone (FML) 0.1 % ophthalmic suspension Place 1 drop into both eyes 4 (four) times daily.     glipiZIDE  (GLUCOTROL  XL) 10 MG 24 hr tablet Take 1 tablet (10 mg total) by mouth daily with breakfast. 30 tablet 11   glucose blood (ACCU-CHEK GUIDE) test strip Use to  check blood fasting blood sugar daily 100 each 3   hydrochlorothiazide  (HYDRODIURIL ) 25 MG tablet Take 1 tablet (25 mg total) by mouth daily as needed. 90 tablet 3   HYDROcodone -acetaminophen  (NORCO/VICODIN) 5-325 MG tablet Take 1 tablet by mouth every 6 (six) hours as needed for moderate pain (pain score 4-6) or severe pain (pain score 7-10). 20 tablet 0   metFORMIN  (GLUCOPHAGE -XR) 500 MG 24 hr tablet Take 2 tablets (1,000 mg total) by mouth 2 (two) times daily with a meal. 360 tablet 3   methocarbamol  (ROBAXIN ) 750 MG tablet Take 1 tablet (750 mg total) by mouth daily as needed for muscle spasms. 30 tablet 1   mupirocin  ointment  (BACTROBAN ) 2 % Apply 1 Application topically 2 (two) times daily. 15 g 0   polyethylene glycol (MIRALAX  / GLYCOLAX ) 17 g packet Take 17 g by mouth daily.     rosuvastatin  (CRESTOR ) 20 MG tablet Take 1 tablet (20 mg total) by mouth at bedtime. 90 tablet 3   sildenafil  (REVATIO ) 20 MG tablet Take 5 tablets prior to sexual activity 20 tablet 11   Plecanatide  (TRULANCE ) 3 MG TABS Take 1 tablet by mouth once daily (Patient not taking: Reported on 03/27/2024) 90 tablet 3   No facility-administered medications prior to visit.     Per HPI unless specifically indicated in ROS section below Review of Systems  Constitutional:  Negative for fatigue and fever.  HENT:  Negative for ear pain.   Eyes:  Negative for pain.  Respiratory:  Negative for cough and shortness of breath.   Cardiovascular:  Negative for chest pain, palpitations and leg swelling.  Gastrointestinal:  Positive for constipation. Negative for abdominal pain.  Genitourinary:  Negative for dysuria.  Musculoskeletal:  Negative for arthralgias.  Skin:  Positive for rash.  Neurological:  Negative for syncope, light-headedness and headaches.  Psychiatric/Behavioral:  Negative for dysphoric mood.    Objective:  BP 108/60   Pulse 83   Temp 97.8 F (36.6 C) (Temporal)   Ht 6' (1.829 m)   Wt (!) 315 lb 8 oz (143.1 kg)   SpO2 97%   BMI 42.79 kg/m   Wt Readings from Last 3 Encounters:  03/27/24 (!) 315 lb 8 oz (143.1 kg)  03/20/24 (!) 315 lb 2 oz (142.9 kg)  01/10/24 (!) 311 lb (141.1 kg)      Physical Exam Vitals reviewed.  Constitutional:      Appearance: He is well-developed.  HENT:     Head: Normocephalic.     Right Ear: Hearing normal.     Left Ear: Hearing normal.     Nose: Nose normal.  Neck:     Thyroid : No thyroid  mass or thyromegaly.     Vascular: No carotid bruit.     Trachea: Trachea normal.  Cardiovascular:     Rate and Rhythm: Normal rate and regular rhythm.     Pulses: Normal pulses.     Heart sounds:  Heart sounds not distant. No murmur heard.    No friction rub. No gallop.     Comments: No peripheral edema Pulmonary:     Effort: Pulmonary effort is normal. No respiratory distress.     Breath sounds: Normal breath sounds.  Musculoskeletal:     Comments: See photo  Skin:    General: Skin is warm and dry.     Findings: No lesion or rash.  Psychiatric:        Speech: Speech normal.        Behavior:  Behavior normal.        Thought Content: Thought content normal.       Results for orders placed or performed in visit on 03/27/24  POCT glycosylated hemoglobin (Hb A1C)   Collection Time: 03/27/24 10:35 AM  Result Value Ref Range   Hemoglobin A1C 7.8 (A) 4.0 - 5.6 %   HbA1c POC (<> result, manual entry)     HbA1c, POC (prediabetic range)     HbA1c, POC (controlled diabetic range)      Assessment and Plan  Type 2 diabetes mellitus with neurological complications (HCC) Assessment & Plan:  Chronic, improving control.  Now trying to be more active using rolling walker, continue to work on healthy low-carb diet. On maximum dose metformin  ER 1000 mg BID.  Back on glipizide  XL 10 mg p.o. daily with meal. Tolerating Jardiance  10 mg daily.  No current evidence of UTI or perineal infection. Given blood sugars trending down significantly will hold off on increasing dose given concern for bladder issues with history of neurogenic bladder... per pt request. Reevaluate A1c in 3 months.    Orders: -     POCT glycosylated hemoglobin (Hb A1C) -     Microalbumin / creatinine urine ratio -     Amb Ref to Medical Weight Management  Hypertension associated with diabetes (HCC) Assessment & Plan: Stable, chronic.  Continue current medication.   HCTZ 25 mg p.o. 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  Orders: -     Amb Ref to  Medical Weight Management  Class 2 severe obesity due to excess calories with serious comorbidity and body mass index (BMI) of 39.0 to 39.9 in adult [facility]) Assessment & Plan: Limited in activity given  Disability from back   Keep up the great on diet change, increase activity.   SE to GLP1 meds.. severe constipation.   Refer to medical bariatric weight loss program.  Orders: -     Amb Ref to Medical Weight Management  Paraparesis of both lower limbs (HCC) Assessment & Plan: Chronic,  limiting activity, but walking improved with bracing.  Orders: -     Amb Ref to Medical Weight Management    Return in about 3 months (around 06/27/2024) for diabetes follow up POC A1C.   Greig Ring, MD

## 2024-04-01 ENCOUNTER — Other Ambulatory Visit: Payer: Self-pay

## 2024-04-02 ENCOUNTER — Other Ambulatory Visit: Payer: Self-pay

## 2024-04-02 ENCOUNTER — Telehealth: Payer: Self-pay

## 2024-04-02 NOTE — Patient Instructions (Signed)
 Visit Information  Thank you for taking time to visit with me today. Please don't hesitate to contact me if I can be of assistance to you before our next scheduled appointment.  Your next care management appointment is by telephone on 04/18/24 at 11am Please call the care guide team at [phone] if you need to cancel, schedule, or reschedule an appointment.   Please call the Suicide and Crisis Lifeline: 988 call the [place]  National Suicide Prevention Lifeline: 919-747-6521 or TTY: 754 274 0645 TTY 802-345-6643) to talk to a trained counselor call 1-800-273-TALK (toll free, 24 hour hotline) call 911 if you are experiencing a Mental Health or Behavioral Health Crisis or need someone to talk to.  Thersia Hoar, HEDWIG, MHA [place]  [facility] Social Worker, Population Health [phone]

## 2024-04-02 NOTE — Patient Outreach (Signed)
 Complex Care Management   Visit Note  04/02/2024  Name:  Jamie Finley MRN: [id number] DOB: [birth year]  Situation: Referral received for Complex Care Management related to SDOH Barriers:  Food insecurity housekeeping I obtained verbal consent from Patient.  Visit completed with patient  on the phone  Background:   Past Medical History:  Diagnosis Date   Arthritis    Depression    Diabetes mellitus without complication (HCC)    Frequent headaches    Gout    Hyperlipidemia    Hypertension    Stroke [facility])     Assessment: SW completed a telephone outreach with patient, he states he did receive the resources SW sent to him for food. He has not heard from [facility] Medicaid yet. SW will send a message to PCP to ensure PCS form was received. Patient reports no additional resources are needed at this time.  SDOH Interventions    Flowsheet Row Office Visit from 03/27/2024 in [facility] [place] [facility] Office Visit from 03/20/2024 in [facility] Patient Outreach from 03/18/2024 in [place] POPULATION HEALTH DEPARTMENT Patient Outreach Telephone from 03/17/2024 in [place] POPULATION HEALTH DEPARTMENT Care Coordination from 01/10/2024 in [place] POPULATION HEALTH DEPARTMENT Office Visit from 01/03/2024 in [facility] [place] [facility] [place]  SDOH Interventions        Food Insecurity Interventions -- -- [employer] Resources Provided Other (Comment)  [place] pending BSW visit 03/18/24] Other (Comment)  Chief Operating Officer to Child psychotherapist for food resources] --  Housing Interventions -- -- -- Intervention Not Indicated Intervention Not Indicated --  Transportation Interventions -- -- -- Intervention Not Indicated Intervention Not Indicated --  Utilities Interventions -- -- -- Intervention Not Indicated Intervention Not Indicated --  Depression Interventions/Treatment  Counseling Counseling -- -- --  [patient agreeable to referral with social  worker.] Counseling  Financial Strain Interventions -- -- [employer] Provided -- -- --      Recommendation:   No recommendations at this time  Follow Up Plan:   Telephone follow-up on 04/18/24 at 11am.  Thersia Hoar, BSW, MHA [place]  [facility] Social Worker, Population Health [phone]

## 2024-04-02 NOTE — Progress Notes (Signed)
 Complex Care Management Care Guide Note  04/02/2024 Name: Ardit Danh MRN: [id number] DOB: 27-Apr-1961  Sanford Lindblad is a 63 y.o. year old male who is a primary care patient of Bedsole, Amy E, MD and is actively engaged with the care management team. I reached out to [name] by phone today to assist with re-scheduling  with the Licensed Clinical Child psychotherapist.  Follow up plan: Telephone appointment with complex care management team member scheduled for:  04/04/24 at 10:30 a.m.   Dreama [facility]  [facility], [facility] Care Management Assistant Direct Dial: [phone]  Fax: [phone]

## 2024-04-03 ENCOUNTER — Ambulatory Visit (INDEPENDENT_AMBULATORY_CARE_PROVIDER_SITE_OTHER): Admitting: Podiatry

## 2024-04-03 DIAGNOSIS — Z91199 Patient's noncompliance with other medical treatment and regimen due to unspecified reason: Secondary | ICD-10-CM

## 2024-04-03 NOTE — Progress Notes (Signed)
 1. No-show for appointment

## 2024-04-04 ENCOUNTER — Telehealth: Payer: Self-pay

## 2024-04-04 NOTE — Patient Outreach (Signed)
 LCSW called patient at scheduled time. LCSW explained the reason for the referral. Patient explained that he is taking medication for his mental health conditions and is feeling better. LCSW inquired if patient wanted  further treatment options for his mental health conditions. Patient declined further treatment. LCSW will not be joining the care team at this time. LCSW informed patient if any care needs arise to reach out.  Olam Ally, MSW, LCSW [place]  [facility], [facility] Licensed Clinical Social Worker Direct Dial: [phone]

## 2024-04-07 ENCOUNTER — Ambulatory Visit (INDEPENDENT_AMBULATORY_CARE_PROVIDER_SITE_OTHER): Admitting: Podiatry

## 2024-04-07 ENCOUNTER — Encounter: Payer: Self-pay | Admitting: Podiatry

## 2024-04-07 ENCOUNTER — Other Ambulatory Visit: Payer: Self-pay

## 2024-04-07 DIAGNOSIS — M79675 Pain in left toe(s): Secondary | ICD-10-CM | POA: Diagnosis not present

## 2024-04-07 DIAGNOSIS — B351 Tinea unguium: Secondary | ICD-10-CM | POA: Diagnosis not present

## 2024-04-07 DIAGNOSIS — E1149 Type 2 diabetes mellitus with other diabetic neurological complication: Secondary | ICD-10-CM | POA: Diagnosis not present

## 2024-04-07 DIAGNOSIS — M79674 Pain in right toe(s): Secondary | ICD-10-CM | POA: Diagnosis not present

## 2024-04-07 MED ORDER — TRULANCE 3 MG PO TABS: 1.0000 | ORAL_TABLET | Freq: Every day | ORAL | 3 refills | Status: AC

## 2024-04-07 NOTE — Progress Notes (Signed)
  Subjective:  Patient ID: Jamie Finley, male    DOB: 28-Mar-1961,  MRN: [id number]  Jamie Finley presents to clinic today for at risk foot care with history of diabetic neuropathy and painful mycotic toenails of both feet that are difficult to trim. Pain interferes with daily activities and wearing enclosed shoe gear comfortably. He has a question regarding discoloration on base of left 5th digit toenail. Chief Complaint  Patient presents with   [facility]    Rm2 Diabetic Foot Care/ A1C 7.8 / Dr. Avelina last visit July 2025   PCP is Avelina Greig BRAVO, MD.  No Known Allergies  Review of Systems: Negative except as noted in the HPI.  Objective: No changes noted in today's physical examination. There were no vitals filed for this visit. Jamie Finley is a pleasant 63 y.o. male obese in NAD. AAO x 3.  Vascular Examination: Vascular status intact b/l with palpable pedal pulses. CFT immediate b/l. Pedal hair present. No edema. No pain with calf compression b/l. Skin temperature gradient WNL b/l. No varicosities noted. No cyanosis or clubbing noted.  Neurological Examination: Protective sensation diminished with 10g monofilament b/l.  Dermatological Examination: Pedal skin with normal turgor, texture and tone b/l. No open wounds nor interdigital macerations noted. Toenails 1-5 b/l thick, discolored, elongated with subungual debris and pain on dorsal palpation. No hyperkeratotic lesions noted b/l.   Left 5th digit toenail has dried heme noted at base of toenail. No underlying hematoma, no erythema, no edema, no drainage, no fluctuance.  Musculoskeletal Examination: Muscle strength 5/5 to LLE. Utilizes motorized chair for mobility assistance.  Radiographs: None  Lab Results  Component Value Date   HGBA1C 7.8 (A) 03/27/2024   HGBA1C 8.7 (H) 12/20/2023   HGBA1C 11.1 (A) 09/28/2023   Assessment/Plan: 1. Pain due to onychomycosis of toenails of both feet   2. Type 2 diabetes mellitus  with neurological complications (HCC)    -Consent given for treatment as described below: -Examined patient. -Continue foot and shoe inspections daily. Monitor blood glucose per PCP/Endocrinologist's recommendations. -Patient to continue soft, supportive shoe gear daily. -Toenails 1-5 bilaterally were debrided in length and girth with sterile nail nippers and dremel. Dremel abrasion of left great toe addressed with Lumicain Hemostatic Solution, cleansed with alcohol. Triple antibiotic ointment applied. Patient/careigver instructed to apply Neosporin Cream once daily for 7 days. -Patient/POA to call should there be question/concern in the interim.   Return in about 3 months (around 07/08/2024).  Delon LITTIE Merlin, DPM      [place] LOCATION: [address] [place] [address]                   Office [phone]   [facility] LOCATION: [address] [place], [place] [address] Office [phone]

## 2024-04-14 ENCOUNTER — Other Ambulatory Visit: Payer: Self-pay

## 2024-04-14 NOTE — Patient Outreach (Signed)
 Complex Care Management   Visit Note  04/14/2024  Name:  Jamie Finley MRN: [id number] DOB: June 13, 1961  Situation: Referral received for Complex Care Management related to Diabetes with Complications and Falls I obtained verbal consent from Patient.  Visit completed with Patient   on the phone  Background:   Past Medical History:  Diagnosis Date   Arthritis    Depression    Diabetes mellitus without complication (HCC)    Frequent headaches    Gout    Hyperlipidemia    Hypertension    Stroke [facility])     Assessment: Patient Reported Symptoms:  Cognitive Cognitive Status: No symptoms reported Cognitive/Intellectual Conditions Management [RPT]: None reported or documented in medical history or problem list   Health Maintenance Behaviors: Annual physical exam, Healthy diet, Exercise (3 wheel walker for exercise with noted improvement) Healing Pattern: Average Health Facilitated by: Pain control, Healthy diet, Rest  Neurological Neurological Review of Symptoms: No symptoms reported Neurological Management Strategies: Routine screening, Medication therapy  HEENT HEENT Symptoms Reported: No symptoms reported HEENT Management Strategies: Routine screening    Cardiovascular Cardiovascular Symptoms Reported: No symptoms reported Does patient have uncontrolled Hypertension?: No    Respiratory Respiratory Symptoms Reported: No symptoms reported Respiratory Management Strategies: Routine screening  Endocrine Endocrine Symptoms Reported: No symptoms reported Is patient diabetic?: Yes Is patient checking blood sugars at home?: Yes List most recent blood sugar readings, include date and time of day: FBG most mornings yesterday 125 - ranges 94-155 no symptoms of high or low, improved diet and exercising Endocrine Self-Management Outcome: 4 (good)  Gastrointestinal Gastrointestinal Symptoms Reported: No symptoms reported Gastrointestinal Management Strategies: Medication therapy     Genitourinary Genitourinary Symptoms Reported: No symptoms reported    Integumentary Other Integumentary Symptoms: scrapes on legs at times, aware to wasj with soap and water , abx ointment and cover, reports raised areas like white scab on legs also quarter sized are on thigh firm no redness - will call PCP if changes, pain redness heat    Musculoskeletal Musculoskelatal Symptoms Reviewed: Difficulty walking, Limited mobility, Unsteady gait, Weakness Musculoskeletal Management Strategies: Routine screening, Medical device, Medication therapy Falls in the past year?: Yes Number of falls in past year: 2 or more Was there an injury with Fall?: Yes Fall Risk Category Calculator: 3 Patient Fall Risk Level: High Fall Risk Patient at Risk for Falls Due to: History of fall(s), Impaired balance/gait, Impaired mobility Fall risk Follow up: Falls evaluation completed, Falls prevention discussed  Psychosocial Psychosocial Symptoms Reported: No symptoms reported Additional Psychological Details: Wellbutrin  helped a lot Behavioral Management Strategies: Medication therapy Major Change/Loss/Stressor/Fears (CP): Medical condition, self Techniques to Cope with Loss/Stress/Change: None Quality of Family Relationships: non-existent Do you feel physically threatened by others?: No      04/14/2024   11:20 AM  Depression screen PHQ 2/9  Decreased Interest 1  Down, Depressed, Hopeless 0  PHQ - 2 Score 1    There were no vitals filed for this visit.  Medications Reviewed Today     Reviewed by Devra Lands, RN (Registered Nurse) on 04/14/24 at 1110  Med List Status: <None>   Medication Order Taking? Sig Documenting Provider Last Dose Status Informant  Accu-Chek Softclix Lancets lancets [id number] Yes Use to check fasting blood sugar daily Bedsole, Amy E, MD  Active   ammonium lactate  (AMLACTIN) 12 % lotion [id number] Yes Apply 1 Application topically as needed for dry skin. Tobie Franky SQUIBB, DPM  Active    aspirin  81 MG tablet [id number] Yes  Take 81 mg by mouth daily. Maree Leni Edyth DELENA, MD  Active Self  blood glucose meter kit and supplies [id number] Yes Dispense based on patient and insurance preference. Use to check blood sugar two times a day. Bedsole, Amy E, MD  Active   Blood Glucose Monitoring Suppl (ACCU-CHEK GUIDE) w/Device KIT [id number] Yes Use to check blood fasting blood sugar daily Bedsole, Amy E, MD  Active   buPROPion  (WELLBUTRIN  XL) 150 MG 24 hr tablet [id number] Yes Take 1 tablet (150 mg total) by mouth daily. Bedsole, Amy E, MD  Active   chlorhexidine  (HIBICLENS ) 4 % external liquid [id number]  Apply topically daily as needed (wash body. Do not use  oral med, prescription was incorrect.).  Patient not taking: Reported on 04/14/2024   Avelina Greig BRAVO, MD  Active   Cholecalciferol (VITAMIN D3) 50 MCG (2000 UT) TABS [id number] Yes Take 5-7 tablets by mouth daily. [provider]  Active   colchicine  0.6 MG tablet [id number] Yes Take 1 tablet (0.6 mg total) by mouth daily as needed. Avelina Greig BRAVO, MD  Active   diclofenac  Sodium (VOLTAREN ) 1 % GEL [id number] Yes Apply topically 4 (four) times daily. [provider]  Active Self  empagliflozin  (JARDIANCE ) 10 MG TABS tablet [id number] Yes Take 1 tablet (10 mg total) by mouth daily before breakfast. Avelina, Amy E, MD  Active   fluorometholone (FML) 0.1 % ophthalmic suspension [id number] Yes Place 1 drop into both eyes 4 (four) times daily. [provider]  Active            Med Note DORI, DAVINA E   Thu Jan 10, 2024 10:36 AM) Patient states he takes as needed.   glipiZIDE  (GLUCOTROL  XL) 10 MG 24 hr tablet [id number] Yes Take 1 tablet (10 mg total) by mouth daily with breakfast. Avelina, Amy E, MD  Active   glucose blood (ACCU-CHEK GUIDE) test strip [id number] Yes Use to check blood fasting blood sugar daily Bedsole, Amy E, MD  Active   hydrochlorothiazide  (HYDRODIURIL ) 25 MG tablet [id number] Yes Take 1 tablet (25 mg total)  by mouth daily as needed. Avelina Greig BRAVO, MD  Active            Med Note DORI, DAVINA E   Thu Jan 10, 2024 10:36 AM) Patient states he takes daily  HYDROcodone -acetaminophen  (NORCO/VICODIN) 5-325 MG tablet [id number] Yes Take 1 tablet by mouth every 6 (six) hours as needed for moderate pain (pain score 4-6) or severe pain (pain score 7-10). Avelina Greig BRAVO, MD  Active   metFORMIN  (GLUCOPHAGE -XR) 500 MG 24 hr tablet [id number] Yes Take 2 tablets (1,000 mg total) by mouth 2 (two) times daily with a meal. Bedsole, Amy E, MD  Active   methocarbamol  (ROBAXIN ) 750 MG tablet [id number] Yes Take 1 tablet (750 mg total) by mouth daily as needed for muscle spasms. Avelina Greig BRAVO, MD  Active   mupirocin  ointment (BACTROBAN ) 2 % [id number] Yes Apply 1 Application topically 2 (two) times daily. Bedsole, Amy E, MD  Active   Plecanatide  (TRULANCE ) 3 MG TABS [id number] Yes Take 1 tablet (3 mg total) by mouth daily. Jinny Carmine, MD  Active   polyethylene glycol (MIRALAX  / GLYCOLAX ) 17 g packet [id number] Yes Take 17 g by mouth daily. [provider]  Active   rosuvastatin  (CRESTOR ) 20 MG tablet [id number] Yes Take 1 tablet (20 mg total) by mouth at bedtime. Avelina Greig BRAVO, MD  Active   sildenafil  (REVATIO ) 20 MG tablet [id number]  Take 5 tablets prior to sexual activity Avelina, Greig BRAVO, MD  Active            Med Note DORI, DAVINA E   Fri Dec 07, 2023  9:22 AM) Patient states not taking.             Recommendation:   PCP Follow-up Continue Current Plan of Care  Follow Up Plan:   Telephone follow-up in 1 month  Nestora Duos, MSN, RN [facility]  [facility], [facility] RN Care Manager Direct Dial: [phone] Fax: [phone]

## 2024-04-14 NOTE — Patient Instructions (Signed)
 Visit Information  Thank you for taking time to visit with me today. Please don't hesitate to contact me if I can be of assistance to you before our next scheduled appointment.  Your next care management appointment is by telephone on 05/14/2024  at 10:00 am  Telephone follow-up in 1 month  Please call the care guide team at [phone] if you need to cancel, schedule, or reschedule an appointment.   Please call the Suicide and Crisis Lifeline: 988 call the [place]  National Suicide Prevention Lifeline: 640 739 1557 or TTY: 8074701861 TTY 6197226749) to talk to a trained counselor call 1-800-273-TALK (toll free, 24 hour hotline) go to [facility] [address] [place] [phone]) call 911 if you are experiencing a Mental Health or Behavioral Health Crisis or need someone to talk to.  Nestora Duos, MSN, RN [place]  [facility], [facility] RN Care Manager Direct Dial: [phone] Fax: [phone]   Type 2 Diabetes Mellitus, Self-Care, Adult Caring for yourself after you have been diagnosed with type 2 diabetes (type 2 diabetes mellitus) means keeping your blood sugar (glucose) under control with a balance of: Nutrition. Exercise. Lifestyle changes. Medicines or insulin , if needed. Support from your team of health care providers and others. What are the risks? Having type 2 diabetes can put you at risk for other long-term (chronic) conditions, such as heart disease and kidney disease. Your health care provider may prescribe medicines to help prevent complications from diabetes. How to monitor your blood glucose  Check your blood glucose every day or as often as told by your health care provider. Have your A1C (hemoglobin A1C) level checked two or more times a year, or as often as told by your health care provider. Your health care provider will set personalized treatment goals for you. Generally, the goal  of treatment is to maintain the following blood glucose levels: Before meals: 80-130 mg/dL (4.4-7.2 mmol/L). After meals: below 180 mg/dL (10 mmol/L). A1C level: less than 7%. How to manage hyperglycemia and hypoglycemia Hyperglycemia symptoms Hyperglycemia, also called high blood glucose, occurs when blood glucose is too high. Make sure you know the early signs of hyperglycemia, such as: Increased thirst. Hunger. Feeling very tired. Needing to urinate more often than usual. Blurry vision. Hypoglycemia symptoms Hypoglycemia, also called low blood glucose, occurs with a blood glucose level at or below 70 mg/dL (3.9 mmol/L). Diabetes medicines lower your blood glucose and can cause hypoglycemia. The risk for hypoglycemia increases during or after exercise, during sleep, during illness, and when skipping meals or not eating for a long time (fasting). It is important to know the symptoms of hypoglycemia and treat it right away. Always have a 15-gram rapid-acting carbohydrate snack with you to treat low blood glucose. Family members and close friends should also know the symptoms and understand how to treat hypoglycemia, in case you are not able to treat yourself. Symptoms may include: Hunger. Anxiety. Sweating and feeling clammy. Dizziness or feeling light-headed. Sleepiness. Increased heart rate. Irritability. Tingling or numbness around the mouth, lips, or tongue. Restless sleep. Severe hypoglycemia is when your blood glucose level is at or below 54 mg/dL (3 mmol/L). Severe hypoglycemia is an emergency. Do not wait to see if the symptoms will go away. Get medical help right away. Call your local emergency services (911 in the [place] Do not drive yourself to the hospital. If you have severe hypoglycemia and you cannot eat or drink, you may need glucagon. A family member or  close friend should learn how to check your blood glucose and how to give you glucagon. Ask your health care provider if  you need to have an emergency glucagon kit available. Follow these instructions at home: Medicines Take prescribed insulin  or diabetes medicines as told by your health care provider. Do not run out of insulin  or other diabetes medicines. Plan ahead so you always have these available. If you use insulin , adjust your dosage based on your physical activity and what foods you eat. Your health care provider will tell you how to adjust your dosage. Take over-the-counter and prescription medicines only as told by your health care provider. Eating and drinking  What you eat and drink affects your blood glucose and your insulin  dosage. Making good choices helps to control your diabetes and prevent other health problems. A healthy meal plan includes eating lean proteins, complex carbohydrates, fresh fruits and vegetables, low-fat dairy products, and healthy fats. Make an appointment to see a registered dietitian to help you create an eating plan that is right for you. Make sure that you: Follow instructions from your health care provider about eating or drinking restrictions. Drink enough fluid to keep your urine pale yellow. Keep a record of the carbohydrates that you eat. Do this by reading food labels and learning the standard serving sizes of foods. Follow your sick-day plan whenever you cannot eat or drink as usual. Make this plan in advance with your health care provider.  Activity Stay active. Exercise regularly, as told by your health care provider. This may include: Stretching and doing strength exercises, such as yoga or weight lifting, two or more times a week. Doing 150 minutes or more of moderate-intensity or vigorous-intensity exercise each week. This could be brisk walking, biking, or water  aerobics. Spread out your activity over 3 or more days of the week. Do not go more than 2 days in a row without doing some kind of physical activity. When you start a new exercise or activity, work with  your health care provider to adjust your insulin , medicines, or food intake as needed. Lifestyle Do not use any products that contain nicotine or tobacco. These products include cigarettes, chewing tobacco, and vaping devices, such as e-cigarettes. If you need help quitting, ask your health care provider. If you drink alcohol and your health care provider says that it is safe for you: Limit how much you have to: 0-1 drink a day for women who are not pregnant. 0-2 drinks a day for men. Know how much alcohol is in your drink. In the [place] one drink equals one 12 oz bottle of beer (355 mL), one 5 oz glass of wine (148 mL), or one 1 oz glass of hard liquor (44 mL). Learn to manage stress. If you need help with this, ask your health care provider. Take care of your body  Keep your immunizations up to date. In addition to getting vaccinations as told by your health care provider, it is recommended that you get vaccinated against the following illnesses: The flu (influenza). Get a flu shot every year. Pneumonia. Hepatitis B. Schedule an eye exam soon after your diagnosis, and then one time every year after that. Check your skin and feet every day for cuts, bruises, redness, blisters, or sores. Schedule a foot exam with your health care provider once every year. Brush your teeth and gums two times a day, and floss one or more times a day. Visit your dentist one or more times every 6  months. Maintain a healthy weight. General instructions Share your diabetes management plan with people in your workplace, school, and household. Carry a medical alert card or wear medical alert jewelry. Keep all follow-up visits. This is important. Questions to ask your health care provider Should I meet with a certified diabetes care and education specialist? Where can I find a support group for people with diabetes? Where to find more information For help and guidance and for more information about diabetes, please  visit: American Diabetes Association (ADA): www.diabetes.org American Association of Diabetes Care and Education Specialists (ADCES): www.diabeteseducator.org International Diabetes Federation (IDF): DCOnly.dk Summary Caring for yourself after you have been diagnosed with type 2 diabetes (type 2 diabetes mellitus) means keeping your blood sugar (glucose) under control with a balance of nutrition, exercise, lifestyle changes, and medicine. Check your blood glucose every day, as often as told by your health care provider. Having diabetes can put you at risk for other long-term (chronic) conditions, such as heart disease and kidney disease. Your health care provider may prescribe medicines to help prevent complications from diabetes. Share your diabetes management plan with people in your workplace, school, and household. Keep all follow-up visits. This is important. This information is not intended to replace advice given to you by your health care provider. Make sure you discuss any questions you have with your health care provider. Document Revised: 01/26/2021 Document Reviewed: 01/26/2021 Elsevier Patient Education  2024 [employer].

## 2024-04-17 ENCOUNTER — Telehealth: Payer: Self-pay

## 2024-04-17 NOTE — Progress Notes (Signed)
 Complex Care Management Care Guide Note  04/17/2024 Name: Jamie Finley MRN: [id number] DOB: 1960/09/20  Jamie Finley is a 63 y.o. year old male who is a primary care patient of Bedsole, Amy E, MD and is actively engaged with the care management team. I reached out to Jamie Finley by phone today to assist with re-scheduling  with the Licensed Clinical Child psychotherapist.  Follow up plan: Telephone appointment with complex care management team member scheduled for:  04/24/24 at 10:00 a.m.   Jamie Finley [facility]  [facility], [facility] Care Management Assistant Direct Dial: [phone]  Fax: [phone]

## 2024-04-18 ENCOUNTER — Other Ambulatory Visit: Payer: Self-pay

## 2024-04-18 NOTE — Patient Outreach (Signed)
 Complex Care Management   Visit Note  04/18/2024  Name:  Jamie Finley MRN: [id number] DOB: [birth year]  Situation: Referral received for Complex Care Management related to SDOH Barriers:  Food insecurity housekeeping I obtained verbal consent from Patient.  Visit completed with patient  on the phone  Background:   Past Medical History:  Diagnosis Date   Arthritis    Depression    Diabetes mellitus without complication (HCC)    Frequent headaches    Gout    Hyperlipidemia    Hypertension    Stroke [facility])     Assessment: SW completed a telephone outreach with patient, he states he has not heard anything from the states about PCS services but will check his voicemail's. SW sent PCS form to PCP office and it was received. Patient states he will use the food resources SW sent him today.  SDOH Interventions    Flowsheet Row Office Visit from 03/27/2024 in [facility] Office Visit from 03/20/2024 in [facility] Patient Outreach from 03/18/2024 in [place] POPULATION HEALTH DEPARTMENT Patient Outreach Telephone from 03/17/2024 in [place] POPULATION HEALTH DEPARTMENT Care Coordination from 01/10/2024 in [place] POPULATION HEALTH DEPARTMENT Office Visit from 01/03/2024 in [facility] [place]  SDOH Interventions        Food Insecurity Interventions -- -- [employer] Provided Other (Comment)  [place] pending BSW visit 03/18/24] Other (Comment)  [refer to Child psychotherapist for food resources] --  Housing Interventions -- -- -- Intervention Not Indicated Intervention Not Indicated --  Transportation Interventions -- -- -- Intervention Not Indicated Intervention Not Indicated --  Utilities Interventions -- -- -- Intervention Not Indicated Intervention Not Indicated --  Depression Interventions/Treatment  Counseling Counseling -- -- --  [patient agreeable to referral with social worker.]  Counseling  Financial Strain Interventions -- -- [employer] Provided -- -- --      Recommendation:   No recommendations at this time.  Follow Up Plan:   Telephone follow-up on 05/09/24 at 10:30  Thersia Hoar, BSW, [facility] [place]  [facility] Social Worker, Population Health [phone]

## 2024-04-18 NOTE — Patient Instructions (Signed)
 Visit Information  Thank you for taking time to visit with me today. Please don't hesitate to contact me if I can be of assistance to you before our next scheduled appointment.  Your next care management appointment is by telephone on 05/09/24 at 10:30    Please call the care guide team at [phone] if you need to cancel, schedule, or reschedule an appointment.   Please call the Suicide and Crisis Lifeline: 988 call the [place]  National Suicide Prevention Lifeline: 626-722-7202 or TTY: 785-317-8817 TTY 623 464 5763) to talk to a trained counselor call 1-800-273-TALK (toll free, 24 hour hotline) call 911 if you are experiencing a Mental Health or Behavioral Health Crisis or need someone to talk to.  Thersia Hoar, HEDWIG, MHA [place]  [facility] Social Worker, Population Health [phone]

## 2024-04-24 ENCOUNTER — Other Ambulatory Visit: Payer: Self-pay

## 2024-04-24 NOTE — Patient Outreach (Signed)
 LCSW called patient at scheduled time. Through chart review, LCSW noted that patient had declined mental health treatment on 04/04/2024. LCSW discussed with patient if he was having any mental health concerns. Patient reports that he is feeling well and his mental health is better than ever. LCSW inquired about medication compliance and patient reports that he is complaint. LCSW offered patient assistance if he needed it. Patient reports that he is still declining mental health treatment due to feeling better. LCSW noted that patient's PHQ2 screened negative on 04/14/2024. LCSW will not be joining the treatment team at this time.

## 2024-05-09 ENCOUNTER — Other Ambulatory Visit: Payer: Self-pay

## 2024-05-12 ENCOUNTER — Other Ambulatory Visit: Payer: Self-pay | Admitting: Family Medicine

## 2024-05-12 DIAGNOSIS — E785 Hyperlipidemia, unspecified: Secondary | ICD-10-CM

## 2024-05-14 ENCOUNTER — Other Ambulatory Visit: Payer: Self-pay

## 2024-05-14 NOTE — Patient Instructions (Signed)
 Visit Information  Thank you for taking time to visit with me today. Please don't hesitate to contact me if I can be of assistance to you before our next scheduled appointment.  Your next care management appointment is by telephone on 06/11/2024 at 10:00 am  Telephone follow-up in 1 month  Please call the care guide team at [phone] if you need to cancel, schedule, or reschedule an appointment.   Please call the Suicide and Crisis Lifeline: 988 call the [place]  National Suicide Prevention Lifeline: 218-075-7719 or TTY: 661-173-8669 TTY 520-120-0698) to talk to a trained counselor call 1-800-273-TALK (toll free, 24 hour hotline) go to [facility] [address], [place] [phone]) call 911 if you are experiencing a Mental Health or Behavioral Health Crisis or need someone to talk to.  Nestora Duos, MSN, RN [facility], [facility] RN Care Manager Direct Dial: [phone] Fax: [phone]

## 2024-05-14 NOTE — Patient Outreach (Signed)
 Complex Care Management   Visit Note  05/14/2024  Name:  Jamie Finley MRN: [id number] DOB: Sep 18, 1960  Situation: Referral received for Complex Care Management related to Diabetes with Complications and Falls I obtained verbal consent from Patient.  Visit completed with Patient  on the phone  Background:   Past Medical History:  Diagnosis Date   Arthritis    Depression    Diabetes mellitus without complication (HCC)    Frequent headaches    Gout    Hyperlipidemia    Hypertension    Stroke [facility])     Assessment: Patient Reported Symptoms:  Cognitive Cognitive Status: Alert and oriented to person, place, and time, Difficulties with attention and concentration, Insightful and able to interpret abstract concepts, Normal speech and language skills, Struggling with memory recall (states concentration issues all his life and better with Wellbutrin , memory issues short term) Cognitive/Intellectual Conditions Management [RPT]: None reported or documented in medical history or problem list   Health Maintenance Behaviors: Annual physical exam, Healthy diet, Exercise Healing Pattern: Average Health Facilitated by: Healthy diet  Neurological   Neurological Management Strategies: Routine screening Neurological Comment: 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, 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  HEENT HEENT Symptoms Reported: No symptoms reported HEENT Management Strategies: Routine screening    Cardiovascular Cardiovascular Symptoms Reported: Swelling in legs or feet Does patient have uncontrolled Hypertension?: No Cardiovascular Management Strategies: Routine screening Weight: (!) 310 lb (140.6 kg) Cardiovascular Comment: no chest pain, occasional swellin gin legs/feet improved with diet change  Respiratory Respiratory Symptoms Reported: No symptoms reported Respiratory Management Strategies: Routine screening  Endocrine Endocrine Symptoms Reported: Shakiness Is patient diabetic?: Yes Is patient  checking blood sugars at home?: Yes List most recent blood sugar readings, include date and time of day: FBG ranging 117-130, occasionally shaky and will have  snack to bring BG back up, does not check BG at this time - usually when not home, continues to exercise and watch diet Endocrine Self-Management Outcome: 4 (good)  Gastrointestinal Gastrointestinal Symptoms Reported: No symptoms reported Additional Gastrointestinal Details: better diet - more regular BMs Gastrointestinal Management Strategies: Medication therapy    Genitourinary Genitourinary Symptoms Reported: No symptoms reported    Integumentary Other Integumentary Symptoms: reports will requet Dermatology referral due to small areas legs, chest and shoulders, upper arms, raised, dry no oopen areas not itchy Skin Management Strategies: Routine screening  Musculoskeletal Musculoskelatal Symptoms Reviewed: Difficulty walking, Limited mobility, Unsteady gait, Joint pain Additional Musculoskeletal Details: no pain, electric wheelchair, 3 wheel walker for exercise Musculoskeletal Management Strategies: Routine screening, Medical device Musculoskeletal Comment: No falls, feeling stronger in legs Falls in the past year?: Yes Number of falls in past year: 2 or more Was there an injury with Fall?: Yes Fall Risk Category Calculator: 3 Patient Fall Risk Level: High Fall Risk Patient at Risk for Falls Due to: Impaired balance/gait, Impaired mobility Fall risk Follow up: Falls evaluation completed, Falls prevention discussed  Psychosocial Psychosocial Symptoms Reported: No symptoms reported Additional Psychological Details: Wellbutrin  helping Behavioral Management Strategies: Medication therapy   Quality of Family Relationships: non-existent Do you feel physically threatened by others?: No    05/14/2024    PHQ2-9 Depression Screening   Little interest or pleasure in doing things Not at all  Feeling down, depressed, or hopeless Not at all   PHQ-2 - Total Score 0  Trouble falling or staying asleep, or sleeping too much    Feeling tired or having little energy    Poor appetite or overeating  Feeling bad about yourself - or that you are a failure or have let yourself or your family down    Trouble concentrating on things, such as reading the newspaper or watching television    Moving or speaking so slowly that other people could have noticed.  Or the opposite - being so fidgety or restless that you have been moving around a lot more than usual    Thoughts that you would be better off dead, or hurting yourself in some way    PHQ2-9 Total Score    If you checked off any problems, how difficult have these problems made it for you to do your work, take care of things at home, or get along with other people    Depression Interventions/Treatment      There were no vitals filed for this visit.  Medications Reviewed Today     Reviewed by Devra Lands, RN (Registered Nurse) on 05/14/24 at 1009  Med List Status: <None>   Medication Order Taking? Sig Documenting Provider Last Dose Status Informant  Accu-Chek Softclix Lancets lancets [id number] Yes Use to check fasting blood sugar daily Bedsole, Amy E, MD  Active   ammonium lactate  (AMLACTIN) 12 % lotion [id number] Yes Apply 1 Application topically as needed for dry skin. Tobie Franky SQUIBB, DPM  Active   aspirin  81 MG tablet [id number] Yes Take 81 mg by mouth daily. Maree Leni Edyth DELENA, MD  Active Self  blood glucose meter kit and supplies [id number] Yes Dispense based on patient and insurance preference. Use to check blood sugar two times a day. Bedsole, Amy E, MD  Active   Blood Glucose Monitoring Suppl (ACCU-CHEK GUIDE) w/Device KIT [id number] Yes Use to check blood fasting blood sugar daily Bedsole, Amy E, MD  Active   buPROPion  (WELLBUTRIN  XL) 150 MG 24 hr tablet [id number] Yes Take 1 tablet (150 mg total) by mouth daily. Bedsole, Amy E, MD  Active   chlorhexidine  (HIBICLENS ) 4 % external  liquid [id number]  Apply topically daily as needed (wash body. Do not use  oral med, prescription was incorrect.).  Patient not taking: Reported on 04/14/2024   Avelina Greig BRAVO, MD  Active   Cholecalciferol (VITAMIN D3) 50 MCG (2000 UT) TABS [id number] Yes Take 5-7 tablets by mouth daily. [provider]  Active   colchicine  0.6 MG tablet [id number] Yes Take 1 tablet (0.6 mg total) by mouth daily as needed. Avelina Greig BRAVO, MD  Active   diclofenac  Sodium (VOLTAREN ) 1 % GEL [id number] Yes Apply topically 4 (four) times daily. [provider]  Active Self  empagliflozin  (JARDIANCE ) 10 MG TABS tablet [id number] Yes Take 1 tablet (10 mg total) by mouth daily before breakfast. Avelina, Amy E, MD  Active   fluorometholone (FML) 0.1 % ophthalmic suspension [id number] Yes Place 1 drop into both eyes 4 (four) times daily. [provider]  Active            Med Note DORI, DAVINA E   Thu Jan 10, 2024 10:36 AM) Patient states he takes as needed.   glipiZIDE  (GLUCOTROL  XL) 10 MG 24 hr tablet [id number] Yes Take 1 tablet (10 mg total) by mouth daily with breakfast. Avelina, Amy E, MD  Active   glucose blood (ACCU-CHEK GUIDE) test strip [id number] Yes Use to check blood fasting blood sugar daily Bedsole, Amy E, MD  Active   hydrochlorothiazide  (HYDRODIURIL ) 25 MG tablet [id number] Yes Take 1 tablet (25 mg total) by mouth daily as needed.  Patient taking  differently: Take 25 mg by mouth daily as needed. Patient takes daily   Bedsole, Greig BRAVO, MD  Active            Med Note DORI, DAVINA E   Thu Jan 10, 2024 10:36 AM) Patient states he takes daily  HYDROcodone -acetaminophen  (NORCO/VICODIN) 5-325 MG tablet [id number]  Take 1 tablet by mouth every 6 (six) hours as needed for moderate pain (pain score 4-6) or severe pain (pain score 7-10).  Patient not taking: Reported on 05/14/2024   Avelina Greig BRAVO, MD  Active   metFORMIN  (GLUCOPHAGE -XR) 500 MG 24 hr tablet [id number] Yes Take 2 tablets (1,000 mg total) by  mouth 2 (two) times daily with a meal. Bedsole, Amy E, MD  Active   methocarbamol  (ROBAXIN ) 750 MG tablet [id number] Yes Take 1 tablet (750 mg total) by mouth daily as needed for muscle spasms. Avelina Greig BRAVO, MD  Active   mupirocin  ointment (BACTROBAN ) 2 % [id number] Yes Apply 1 Application topically 2 (two) times daily. Avelina Greig BRAVO, MD  Active   Plecanatide  (TRULANCE ) 3 MG TABS [id number] Yes Take 1 tablet (3 mg total) by mouth daily. Jinny Carmine, MD  Active   polyethylene glycol (MIRALAX  / GLYCOLAX ) 17 g packet [id number] Yes Take 17 g by mouth daily. [provider]  Active   rosuvastatin  (CRESTOR ) 20 MG tablet [id number] Yes TAKE 1 TABLET AT BEDTIME Bedsole, Amy E, MD  Active   sildenafil  (REVATIO ) 20 MG tablet [id number]  Take 5 tablets prior to sexual activity  Patient not taking: Reported on 05/14/2024   Avelina Greig BRAVO, MD  Active            Med Note DORI, DAVINA E   Fri Dec 07, 2023  9:22 AM) Patient states not taking.             Recommendation:   PCP Follow-up Continue Current Plan of Care  Follow Up Plan:   Telephone follow-up in 1 month  Nestora Duos, MSN, RN [facility]  [facility], [facility] RN Care Manager Direct Dial: [phone] Fax: [phone]

## 2024-05-21 ENCOUNTER — Telehealth: Payer: Self-pay

## 2024-05-30 ENCOUNTER — Telehealth: Payer: Self-pay

## 2024-05-30 NOTE — Patient Instructions (Signed)
 [name] - I am sorry I was unable to reach you today for our scheduled appointment. I work with Avelina Greig BRAVO, MD and am calling to support your healthcare needs. Please contact me at [phone] at your earliest convenience. I look forward to speaking with you soon.   Thank you,  Thersia Hoar, BSW, MHA [place]  [facility] Social Worker, Population Health [phone]

## 2024-06-11 ENCOUNTER — Telehealth: Payer: Self-pay

## 2024-06-24 DIAGNOSIS — Z0100 Encounter for examination of eyes and vision without abnormal findings: Secondary | ICD-10-CM | POA: Diagnosis not present

## 2024-06-27 ENCOUNTER — Encounter: Payer: Self-pay | Admitting: Family Medicine

## 2024-06-27 ENCOUNTER — Ambulatory Visit: Admitting: Family Medicine

## 2024-06-27 VITALS — BP 100/60 | HR 82 | Temp 98.2°F | Ht 72.0 in | Wt 309.0 lb

## 2024-06-27 DIAGNOSIS — Z7984 Long term (current) use of oral hypoglycemic drugs: Secondary | ICD-10-CM

## 2024-06-27 DIAGNOSIS — L989 Disorder of the skin and subcutaneous tissue, unspecified: Secondary | ICD-10-CM | POA: Diagnosis not present

## 2024-06-27 DIAGNOSIS — E1159 Type 2 diabetes mellitus with other circulatory complications: Secondary | ICD-10-CM

## 2024-06-27 DIAGNOSIS — E1149 Type 2 diabetes mellitus with other diabetic neurological complication: Secondary | ICD-10-CM

## 2024-06-27 DIAGNOSIS — R0683 Snoring: Secondary | ICD-10-CM | POA: Diagnosis not present

## 2024-06-27 DIAGNOSIS — I152 Hypertension secondary to endocrine disorders: Secondary | ICD-10-CM

## 2024-06-27 DIAGNOSIS — F331 Major depressive disorder, recurrent, moderate: Secondary | ICD-10-CM

## 2024-06-27 LAB — POCT GLYCOSYLATED HEMOGLOBIN (HGB A1C): Hemoglobin A1C: 6.6 % — AB (ref 4.0–5.6)

## 2024-06-27 LAB — HM DIABETES FOOT EXAM

## 2024-06-27 MED ORDER — BUPROPION HCL ER (XL) 150 MG PO TB24: 150.0000 mg | ORAL_TABLET | Freq: Every day | ORAL | 3 refills | Status: AC

## 2024-06-27 MED ORDER — HYDROCHLOROTHIAZIDE 25 MG PO TABS: 25.0000 mg | ORAL_TABLET | Freq: Every day | ORAL | 3 refills | Status: AC

## 2024-06-27 MED ORDER — METFORMIN HCL ER 500 MG PO TB24: 1000.0000 mg | ORAL_TABLET | Freq: Two times a day (BID) | ORAL | 3 refills | Status: AC

## 2024-06-27 NOTE — Assessment & Plan Note (Signed)
Stable, chronic.  Continue current medication.   HCTZ 25 mg p.o. daily as needed swelling

## 2024-06-27 NOTE — Assessment & Plan Note (Signed)
 Chronic, improving control.  Now trying to be more active using rolling walker, continue to work on healthy low-carb diet. On maximum dose metformin  ER 1000 mg BID.  Back on glipizide  XL 10 mg p.o. daily with meal. Tolerating Jardiance  10 mg daily.  No current evidence of UTI or perineal infection. Given blood sugars trending down significantly will hold off on increasing dose given concern for bladder issues with history of neurogenic bladder... per pt request. Reevaluate A1c in 3 months.

## 2024-06-27 NOTE — Assessment & Plan Note (Signed)
 Possible SKs on hands, pt has multiple other skin concerns.. referral to Derm placed.

## 2024-06-27 NOTE — Patient Outreach (Signed)
 RNCM spoke with patient regarding continuation of services after multiple missed calls/appointments. Rescheduled for RNCM. Declines need to reschedule with BSW at this time.

## 2024-06-27 NOTE — Assessment & Plan Note (Addendum)
 improved control on wellbutrin  Xl  Doing better and considering weaning off.   Still some emotional reaction... he requests referral to therapist... info given for [facility].

## 2024-06-27 NOTE — Progress Notes (Signed)
 Patient ID: Jamie Finley, male    DOB: 07-Sep-1961, 63 y.o.   MRN: [id number]  This visit was conducted in person.  BP 100/60   Pulse 82   Temp 98.2 F (36.8 C) (Temporal)   Ht 6' (1.829 m)   Wt (!) 309 lb (140.2 kg)   SpO2 95%   BMI 41.91 kg/m    CC:  Chief Complaint  Patient presents with   Diabetes   Medication Refill    Hydrocodone -APAP    Subjective:   HPI: Jamie Finley is a 63 y.o. male presenting on 06/27/2024 for Diabetes and Medication Refill (Hydrocodone -APAP)   He has had  increasing frequency of   sleep paralysis sometime during naps. Feels awake but cannot move. Spells feel like he cannot breath.  Lasts few  seconds/minutes.  Notes more sleeping on back.  Helps to sit up.  Not sure he snores. Ex wife says he does.   MDD.SABRA improved control on Wellbutrin  Xl  Doing better and considering weaning off.  He is considering seeing a therapist about  the fact that he crys when seeing sad things on TV etc... seems more emotional.   He also request dermatology referral for multiole skin issues.     05/14/2024   10:21 AM 04/14/2024   11:20 AM 03/27/2024   10:34 AM  PHQ9 SCORE ONLY  PHQ-9 Total Score 0 1  11      Data saved with a previous flowsheet row definition      03/27/2024   10:34 AM 03/20/2024    4:25 PM 02/20/2024   12:22 PM 01/10/2024   11:08 AM  GAD 7 : Generalized Anxiety Score  Nervous, Anxious, on Edge 0 1 1 2   Control/stop worrying 0 1 3 1   Worry too much - different things 0 1 3 1   Trouble relaxing 3 3 3 3   Restless 1 1 3 2   Easily annoyed or irritable 0 0 2 1  Afraid - awful might happen 0 0 2 2  Total GAD 7 Score 4 7 17 12   Anxiety Difficulty Somewhat difficult Somewhat difficult Very difficult         Diabetes:  Continued improvement! Currently on Jardiance  10 mg daily Glipizide  XL 10 mg daily Metformin  ER 1000 mg twice daily, max. GLP-1 caused severe constipation. Also did not tolerate Rybelsus   He is eating less then  previously. He has cut pout soda... only having cheerwine zero, less bread overall Lab Results  Component Value Date   HGBA1C 6.6 (A) 06/27/2024  In the past he has tolerated glipizide , Actos  has caused peripheral swelling  Was on insulin  in past. Using medications without difficulties: Hypoglycemic episodes: none Hyperglycemic episodes:  none Feet problems: no ulcers. Blood Sugars averaging: 113-140 fasting most days. eye exam within last year: yes Urine microalbumin collected today   Wt Readings from Last 3 Encounters:  06/27/24 (!) 309 lb (140.2 kg)  05/14/24 (!) 310 lb (140.6 kg)  03/27/24 (!) 315 lb 8 oz (143.1 kg)   Hypertension:   HCTZ 25 mg p.o. daily as needed swelling BP Readings from Last 3 Encounters:  06/27/24 100/60  03/27/24 108/60  03/20/24 108/64  Using medication without problems or lightheadedness:  none Chest pain with exertion: none Edema: none Short of breath: none Average home BPs: Other issues:    Relevant past medical, surgical, family and social history reviewed and updated as indicated. Interim medical history since our last visit reviewed. Allergies and medications reviewed and  updated. Outpatient Medications Prior to Visit  Medication Sig Dispense Refill   Accu-Chek Softclix Lancets lancets Use to check fasting blood sugar daily 100 each 3   ammonium lactate  (AMLACTIN) 12 % lotion Apply 1 Application topically as needed for dry skin. 400 g 0   aspirin  81 MG tablet Take 81 mg by mouth daily.     blood glucose meter kit and supplies Dispense based on patient and insurance preference. Use to check blood sugar two times a day. 1 each 0   Blood Glucose Monitoring Suppl (ACCU-CHEK GUIDE) w/Device KIT Use to check blood fasting blood sugar daily 1 kit 0   Cholecalciferol (VITAMIN D3) 50 MCG (2000 UT) TABS Take 5-7 tablets by mouth daily.     colchicine  0.6 MG tablet Take 1 tablet (0.6 mg total) by mouth daily as needed. 30 tablet 2   diclofenac   Sodium (VOLTAREN ) 1 % GEL Apply topically 4 (four) times daily.     empagliflozin  (JARDIANCE ) 10 MG TABS tablet Take 1 tablet (10 mg total) by mouth daily before breakfast. 30 tablet 11   fluorometholone (FML) 0.1 % ophthalmic suspension Place 1 drop into both eyes 4 (four) times daily.     glipiZIDE  (GLUCOTROL  XL) 10 MG 24 hr tablet Take 1 tablet (10 mg total) by mouth daily with breakfast. 30 tablet 11   glucose blood (ACCU-CHEK GUIDE) test strip Use to check blood fasting blood sugar daily 100 each 3   HYDROcodone -acetaminophen  (NORCO/VICODIN) 5-325 MG tablet Take 1 tablet by mouth every 6 (six) hours as needed for moderate pain (pain score 4-6) or severe pain (pain score 7-10). 20 tablet 0   methocarbamol  (ROBAXIN ) 750 MG tablet Take 1 tablet (750 mg total) by mouth daily as needed for muscle spasms. 30 tablet 1   mupirocin  ointment (BACTROBAN ) 2 % Apply 1 Application topically 2 (two) times daily. 15 g 0   Plecanatide  (TRULANCE ) 3 MG TABS Take 1 tablet (3 mg total) by mouth daily. 90 tablet 3   polyethylene glycol (MIRALAX  / GLYCOLAX ) 17 g packet Take 17 g by mouth daily.     rosuvastatin  (CRESTOR ) 20 MG tablet TAKE 1 TABLET AT BEDTIME 90 tablet 1   sildenafil  (REVATIO ) 20 MG tablet Take 5 tablets prior to sexual activity 20 tablet 11   buPROPion  (WELLBUTRIN  XL) 150 MG 24 hr tablet Take 1 tablet (150 mg total) by mouth daily. 30 tablet 5   hydrochlorothiazide  (HYDRODIURIL ) 25 MG tablet Take 1 tablet (25 mg total) by mouth daily as needed. (Patient taking differently: Take 25 mg by mouth daily as needed. Patient takes daily) 90 tablet 3   metFORMIN  (GLUCOPHAGE -XR) 500 MG 24 hr tablet Take 2 tablets (1,000 mg total) by mouth 2 (two) times daily with a meal. 360 tablet 3   chlorhexidine  (HIBICLENS ) 4 % external liquid Apply topically daily as needed (wash body. Do not use  oral med, prescription was incorrect.). (Patient not taking: Reported on 04/14/2024) 236 mL 1   No facility-administered  medications prior to visit.     Per HPI unless specifically indicated in ROS section below Review of Systems  Constitutional:  Negative for fatigue and fever.  HENT:  Negative for ear pain.   Eyes:  Negative for pain.  Respiratory:  Negative for cough and shortness of breath.   Cardiovascular:  Negative for chest pain, palpitations and leg swelling.  Gastrointestinal:  Positive for constipation. Negative for abdominal pain.  Genitourinary:  Negative for dysuria.  Musculoskeletal:  Negative for  arthralgias.  Skin:  Positive for rash.  Neurological:  Negative for syncope, light-headedness and headaches.  Psychiatric/Behavioral:  Negative for dysphoric mood.    Objective:  BP 100/60   Pulse 82   Temp 98.2 F (36.8 C) (Temporal)   Ht 6' (1.829 m)   Wt (!) 309 lb (140.2 kg)   SpO2 95%   BMI 41.91 kg/m   Wt Readings from Last 3 Encounters:  06/27/24 (!) 309 lb (140.2 kg)  05/14/24 (!) 310 lb (140.6 kg)  03/27/24 (!) 315 lb 8 oz (143.1 kg)      Physical Exam Vitals reviewed.  Constitutional:      Appearance: He is well-developed.  HENT:     Head: Normocephalic.     Right Ear: Hearing normal.     Left Ear: Hearing normal.     Nose: Nose normal.  Neck:     Thyroid : No thyroid  mass or thyromegaly.     Vascular: No carotid bruit.     Trachea: Trachea normal.  Cardiovascular:     Rate and Rhythm: Normal rate and regular rhythm.     Pulses: Normal pulses.     Heart sounds: Heart sounds not distant. No murmur heard.    No friction rub. No gallop.     Comments: No peripheral edema Pulmonary:     Effort: Pulmonary effort is normal. No respiratory distress.     Breath sounds: Normal breath sounds.  Musculoskeletal:     Comments: See photo  Skin:    General: Skin is warm and dry.     Findings: No lesion or rash.  Psychiatric:        Speech: Speech normal.        Behavior: Behavior normal.        Thought Content: Thought content normal.   Diabetic foot exam: Normal  inspection No skin breakdown No calluses  Normal DP pulses Normal sensation to light touch and monofilament Nails normal     Results for orders placed or performed in visit on 06/27/24  HM DIABETES FOOT EXAM   Collection Time: 06/27/24 12:00 AM  Result Value Ref Range   HM Diabetic Foot Exam done   POCT glycosylated hemoglobin (Hb A1C)   Collection Time: 06/27/24 10:39 AM  Result Value Ref Range   Hemoglobin A1C 6.6 (A) 4.0 - 5.6 %   HbA1c POC (<> result, manual entry)     HbA1c, POC (prediabetic range)     HbA1c, POC (controlled diabetic range)      Assessment and Plan  Type 2 diabetes mellitus with neurological complications (HCC) Assessment & Plan:  Chronic, improving control.  Now trying to be more active using rolling walker, continue to work on healthy low-carb diet. On maximum dose metformin  ER 1000 mg BID.  Back on glipizide  XL 10 mg p.o. daily with meal. Tolerating Jardiance  10 mg daily.  No current evidence of UTI or perineal infection. Given blood sugars trending down significantly will hold off on increasing dose given concern for bladder issues with history of neurogenic bladder... per pt request. Reevaluate A1c in 3 months.    Orders: -     POCT glycosylated hemoglobin (Hb A1C) -     metFORMIN  HCl ER; Take 2 tablets (1,000 mg total) by mouth 2 (two) times daily with a meal.  Dispense: 360 tablet; Refill: 3  Hypertension associated with diabetes (HCC) Assessment & Plan: Stable, chronic.  Continue current medication.   HCTZ 25 mg p.o. daily as needed swelling  Orders: -     hydroCHLOROthiazide ; Take 1 tablet (25 mg total) by mouth daily.  Dispense: 90 tablet; Refill: 3  MDD (major depressive disorder), recurrent episode, moderate (HCC) Assessment & Plan: improved control on wellbutrin  Xl  Doing better and considering weaning off.   Still some emotional reaction... he requests referral to therapist... info given for [facility].  Orders: -      buPROPion  HCl ER (XL); Take 1 tablet (150 mg total) by mouth daily.  Dispense: 90 tablet; Refill: 3  Snoring Assessment & Plan:  Associated with spells of  possible sleep paralysis vs sleep apnea.  Referral to sleep specialist.  Orders: -     Pulmonary Visit  Skin lesion Assessment & Plan: Possible SKs on hands, pt has multiple other skin concerns.. referral to Derm placed.  Orders: -     Ambulatory referral to Dermatology    Return in about 3 months (around 09/27/2024) for  NO PHONE AMW, fasting labs then AMW with me.   Greig Ring, MD

## 2024-06-27 NOTE — Assessment & Plan Note (Signed)
 Associated with spells of  possible sleep paralysis vs sleep apnea.  Referral to sleep specialist.

## 2024-07-07 ENCOUNTER — Encounter: Payer: Self-pay | Admitting: Nurse Practitioner

## 2024-07-07 ENCOUNTER — Ambulatory Visit (INDEPENDENT_AMBULATORY_CARE_PROVIDER_SITE_OTHER): Admitting: Nurse Practitioner

## 2024-07-07 VITALS — BP 116/66 | HR 70 | Temp 98.1°F | Ht 72.0 in | Wt 305.0 lb

## 2024-07-07 DIAGNOSIS — G478 Other sleep disorders: Secondary | ICD-10-CM | POA: Insufficient documentation

## 2024-07-07 DIAGNOSIS — G4719 Other hypersomnia: Secondary | ICD-10-CM | POA: Diagnosis not present

## 2024-07-07 DIAGNOSIS — R0683 Snoring: Secondary | ICD-10-CM | POA: Diagnosis not present

## 2024-07-07 DIAGNOSIS — G4710 Hypersomnia, unspecified: Secondary | ICD-10-CM | POA: Insufficient documentation

## 2024-07-07 DIAGNOSIS — G4753 Recurrent isolated sleep paralysis: Secondary | ICD-10-CM

## 2024-07-07 NOTE — Patient Instructions (Signed)
 Given your symptoms, I am concerned that you may have sleep disordered breathing with sleep apnea. You will need a sleep study for further evaluation. Someone will contact you to schedule this.   We discussed how untreated sleep apnea puts an individual at risk for cardiac arrhthymias, pulm HTN, DM, stroke and increases their risk for daytime accidents. We also briefly reviewed treatment options including weight loss, side sleeping position, oral appliance, CPAP therapy or referral to ENT for possible surgical options  Use caution when driving and pull over if you become sleepy.  Follow up in 6 weeks with Katie Reda Gettis,NP to go over sleep study results, or sooner, if needed. Friday PM virtual clinic preferred

## 2024-07-07 NOTE — Assessment & Plan Note (Signed)
 See above

## 2024-07-07 NOTE — Assessment & Plan Note (Signed)
 He has snoring, daytime sleepiness, nocturnal apneic events, morning headaches, restless sleep, sleep paralysis. BMI 41. History of HTN, DM. Epworth 13. Given this,  I am concerned he could have sleep disordered breathing with obstructive sleep apnea. He will need sleep study for further evaluation.    - discussed how weight can impact sleep and risk for sleep disordered breathing - discussed options to assist with weight loss: combination of diet modification, cardiovascular and strength training exercises   - had an extensive discussion regarding the adverse health consequences related to untreated sleep disordered breathing - specifically discussed the risks for hypertension, coronary artery disease, cardiac dysrhythmias, cerebrovascular disease, and diabetes - lifestyle modification discussed   - discussed how sleep disruption can increase risk of accidents, particularly when driving - safe driving practices were discussed  Patient Instructions  Given your symptoms, I am concerned that you may have sleep disordered breathing with sleep apnea. You will need a sleep study for further evaluation. Someone will contact you to schedule this.   We discussed how untreated sleep apnea puts an individual at risk for cardiac arrhthymias, pulm HTN, DM, stroke and increases their risk for daytime accidents. We also briefly reviewed treatment options including weight loss, side sleeping position, oral appliance, CPAP therapy or referral to ENT for possible surgical options  Use caution when driving and pull over if you become sleepy.  Follow up in 6 weeks with Katie Rito Lecomte,NP to go over sleep study results, or sooner, if needed. Friday PM virtual clinic preferred

## 2024-07-07 NOTE — Progress Notes (Signed)
 @Patient  ID: Jamie Finley, male    DOB: [birth year], 63 y.o.   MRN: [id number]  Chief Complaint  Patient presents with   Sleep Apnea    Snoring. Restless sleep. Reports, he believes he stops breathing during sleep.     Referring provider: Avelina Greig BRAVO, MD  HPI: 63 year old male, former smoker referred for sleep consult. Past medical history significant for HTN, DM, cardiomegaly, DM, HLD, paraparesis of BLE, depression, hx of DVT, hx of CVA, ED, obesity.   TEST/EVENTS:   07/07/2024: Today - sleep consult Discussed the use of AI scribe software for clinical note transcription with the patient, who gave verbal consent to proceed.  History of Present Illness Jamie Finley is a 63 year old male who presents with suspected sleep apnea and episodes of sleep paralysis.  He experiences episodes of sleep paralysis, characterized by waking up unable to move or speak, often occurring when lying on his back. He attempts to move during these episodes but is unable to do so until he fully wakes up. To mitigate this, he uses an elevated bed and prefers sleeping on his side. He has gasping episodes after these episodes. Believes he stops breathing at night.   He has been informed by others that he snores and possibly stops breathing during sleep. During the day, he experiences tiredness about half of the time and reports frequent nighttime awakenings, often to change positions or urinate once or twice. He occasionally wakes up with a dry mouth, which he attributes to diabetes, and sometimes experiences significant headaches upon waking.  He rarely has issues with drowsy driving, usually only if he hasn't slept. Never fallen asleep while driving. He has not undergone a previous sleep study and does not currently take any medication for sleep. He attempted to use a CPAP machine provided by his aunt and uncle but found it uncomfortable and was unable to use it effectively.   He goes to bed around  1230 am. Takes him a while to fall asleep, sometimes 1-2 hours. Wakes up 3-4 times a night. Gets up around 9 or 10 am. No significant weight change. No heavy machinery. Retired. Lives alone. Rare alcohol use. No excessive caffeine. No sleep aids.      07/07/2024    1:36 PM  Results of the Epworth flowsheet  Sitting and reading 1  Watching TV 2  Sitting, inactive in a public place (e.g. a theatre or a meeting) 2  As a passenger in a car for an hour without a break 1  Lying down to rest in the afternoon when circumstances permit 3  Sitting and talking to someone 1  Sitting quietly after a lunch without alcohol 2  In a car, while stopped for a few minutes in traffic 1  Total score 13       No Known Allergies  Immunization History  Administered Date(s) Administered   Moderna Sars-Covid-2 Vaccination 05/03/2020, 06/07/2020   Td 01/03/2024    Past Medical History:  Diagnosis Date   Arthritis    Depression    Diabetes mellitus without complication (HCC)    Frequent headaches    Gout    Hyperlipidemia    Hypertension    Stroke (HCC)     Tobacco History: Social History   Tobacco Use  Smoking Status Former   Current packs/day: 0.00   Average packs/day: 1 pack/day for 5.0 years (5.0 ttl pk-yrs)   Types: Cigarettes   Start date: 09/11/1984   Quit date:  09/11/1989   Years since quitting: 34.8  Smokeless Tobacco Never   Counseling given: Not Answered   Outpatient Medications Prior to Visit  Medication Sig Dispense Refill   Accu-Chek Softclix Lancets lancets Use to check fasting blood sugar daily 100 each 3   ammonium lactate  (AMLACTIN) 12 % lotion Apply 1 Application topically as needed for dry skin. 400 g 0   aspirin  81 MG tablet Take 81 mg by mouth daily.     blood glucose meter kit and supplies Dispense based on patient and insurance preference. Use to check blood sugar two times a day. 1 each 0   Blood Glucose Monitoring Suppl (ACCU-CHEK GUIDE) w/Device KIT Use to  check blood fasting blood sugar daily 1 kit 0   buPROPion  (WELLBUTRIN  XL) 150 MG 24 hr tablet Take 1 tablet (150 mg total) by mouth daily. 90 tablet 3   Cholecalciferol (VITAMIN D3) 50 MCG (2000 UT) TABS Take 5-7 tablets by mouth daily.     colchicine  0.6 MG tablet Take 1 tablet (0.6 mg total) by mouth daily as needed. 30 tablet 2   diclofenac  Sodium (VOLTAREN ) 1 % GEL Apply topically 4 (four) times daily. (Patient taking differently: Apply topically 4 (four) times daily. PRN)     empagliflozin  (JARDIANCE ) 10 MG TABS tablet Take 1 tablet (10 mg total) by mouth daily before breakfast. 30 tablet 11   fluorometholone (FML) 0.1 % ophthalmic suspension Place 1 drop into both eyes 4 (four) times daily.     glipiZIDE  (GLUCOTROL  XL) 10 MG 24 hr tablet Take 1 tablet (10 mg total) by mouth daily with breakfast. 30 tablet 11   glucose blood (ACCU-CHEK GUIDE) test strip Use to check blood fasting blood sugar daily 100 each 3   hydrochlorothiazide  (HYDRODIURIL ) 25 MG tablet Take 1 tablet (25 mg total) by mouth daily. 90 tablet 3   HYDROcodone -acetaminophen  (NORCO/VICODIN) 5-325 MG tablet Take 1 tablet by mouth every 6 (six) hours as needed for moderate pain (pain score 4-6) or severe pain (pain score 7-10). 20 tablet 0   metFORMIN  (GLUCOPHAGE -XR) 500 MG 24 hr tablet Take 2 tablets (1,000 mg total) by mouth 2 (two) times daily with a meal. 360 tablet 3   methocarbamol  (ROBAXIN ) 750 MG tablet Take 1 tablet (750 mg total) by mouth daily as needed for muscle spasms. 30 tablet 1   mupirocin  ointment (BACTROBAN ) 2 % Apply 1 Application topically 2 (two) times daily. (Patient taking differently: Apply 1 Application topically 2 (two) times daily. PRN) 15 g 0   Plecanatide  (TRULANCE ) 3 MG TABS Take 1 tablet (3 mg total) by mouth daily. 90 tablet 3   polyethylene glycol (MIRALAX  / GLYCOLAX ) 17 g packet Take 17 g by mouth daily. (Patient taking differently: Take 17 g by mouth daily. PRN)     rosuvastatin  (CRESTOR ) 20 MG  tablet TAKE 1 TABLET AT BEDTIME 90 tablet 1   sildenafil  (REVATIO ) 20 MG tablet Take 5 tablets prior to sexual activity (Patient taking differently: Take 5 tablets prior to sexual activity. PRN) 20 tablet 11   No facility-administered medications prior to visit.     Review of Systems: as above    Physical Exam:  BP 116/66   Pulse 70   Temp 98.1 F (36.7 C) (Temporal)   Ht 6' (1.829 m)   Wt (!) 305 lb (138.3 kg)   SpO2 96%   BMI 41.37 kg/m   GEN: Pleasant, interactive, well-kempt; morbidly obese; in no acute distress HEENT:  Normocephalic and atraumatic.  PERRLA. Sclera white. Nasal turbinates pink, moist and patent bilaterally. No rhinorrhea present. Oropharynx pink and moist, without exudate or edema. No lesions, ulcerations, or postnasal drip. Mallampati III/IV NECK:  Supple w/ fair ROM. Thyroid  symmetrical with no goiter or nodules palpated.  CV: RRR, no m/r/g PULMONARY:  Unlabored, regular breathing. Clear bilaterally A&P w/o wheezes/rales/rhonchi. No accessory muscle use.  GI: BS present and normoactive. Soft, non-tender to palpation. MSK: No erythema, warmth or tenderness.  Neuro: A/Ox3. No focal deficits noted.   Skin: Warm, no lesions or rashe Psych: Normal affect and behavior. Judgement and thought content appropriate.     Lab Results:  CBC    Component Value Date/Time   WBC 12.4 (H) 02/20/2024 1328   RBC 5.48 02/20/2024 1328   HGB 15.2 02/20/2024 1328   HGB 13.9 01/06/2015 0411   HCT 45.4 02/20/2024 1328   HCT 41.6 01/06/2015 0411   PLT 283 02/20/2024 1328   PLT 226 01/06/2015 0411   MCV 82.8 02/20/2024 1328   MCV 88 01/06/2015 0411   MCH 27.7 02/20/2024 1328   MCHC 33.5 02/20/2024 1328   RDW 14.4 02/20/2024 1328   RDW 13.9 01/06/2015 0411   LYMPHSABS 3.4 07/13/2021 0409   LYMPHSABS 2.4 02/05/2014 1024   MONOABS 0.9 07/13/2021 0409   MONOABS 0.7 02/05/2014 1024   EOSABS 112 02/20/2024 1328   EOSABS 0.2 02/05/2014 1024   BASOSABS 74 02/20/2024 1328    BASOSABS 0.1 02/05/2014 1024    BMET    Component Value Date/Time   NA 139 12/20/2023 1012   NA 141 01/06/2015 0411   K 3.8 12/20/2023 1012   K 4.0 01/06/2015 0411   CL 99 12/20/2023 1012   CL 104 01/06/2015 0411   CO2 30 12/20/2023 1012   CO2 30 01/06/2015 0411   GLUCOSE 110 (H) 12/20/2023 1012   GLUCOSE 115 (H) 01/06/2015 0411   BUN 19 12/20/2023 1012   BUN 20 01/06/2015 0411   CREATININE 1.07 12/20/2023 1012   CREATININE 0.92 06/13/2017 1512   CALCIUM  9.2 12/20/2023 1012   CALCIUM  9.4 01/06/2015 0411   GFRNONAA >60 02/11/2022 1953   GFRNONAA 93 06/13/2017 1512   GFRAA >60 02/11/2019 2336   GFRAA 107 06/13/2017 1512    BNP No results found for: BNP   Imaging:  No results found.  Administration History     None           No data to display          No results found for: NITRICOXIDE      Assessment & Plan:   Loud snoring He has snoring, daytime sleepiness, nocturnal apneic events, morning headaches, restless sleep, sleep paralysis. BMI 41. History of HTN, DM. Epworth 13. Given this,  I am concerned he could have sleep disordered breathing with obstructive sleep apnea. He will need sleep study for further evaluation.    - discussed how weight can impact sleep and risk for sleep disordered breathing - discussed options to assist with weight loss: combination of diet modification, cardiovascular and strength training exercises   - had an extensive discussion regarding the adverse health consequences related to untreated sleep disordered breathing - specifically discussed the risks for hypertension, coronary artery disease, cardiac dysrhythmias, cerebrovascular disease, and diabetes - lifestyle modification discussed   - discussed how sleep disruption can increase risk of accidents, particularly when driving - safe driving practices were discussed  Patient Instructions  Given your symptoms, I am concerned that you may have sleep  disordered  breathing with sleep apnea. You will need a sleep study for further evaluation. Someone will contact you to schedule this.   We discussed how untreated sleep apnea puts an individual at risk for cardiac arrhthymias, pulm HTN, DM, stroke and increases their risk for daytime accidents. We also briefly reviewed treatment options including weight loss, side sleeping position, oral appliance, CPAP therapy or referral to ENT for possible surgical options  Use caution when driving and pull over if you become sleepy.  Follow up in 6 weeks with Katie Ezekeil Bethel,NP to go over sleep study results, or sooner, if needed. Friday PM virtual clinic preferred       Daytime hypersomnia See above  Sleep paralysis See above   Advised if symptoms do not improve or worsen, to please contact office for sooner follow up or seek emergency care.   I spent 35 minutes of dedicated to the care of this patient on the date of this encounter to include pre-visit review of records, face-to-face time with the patient discussing conditions above, post visit ordering of testing, clinical documentation with the electronic health record, making appropriate referrals as documented, and communicating necessary findings to members of the patients care team.  Comer LULLA Rouleau, NP 07/07/2024  Pt aware and understands NP's role.

## 2024-07-10 ENCOUNTER — Ambulatory Visit: Admitting: Podiatry

## 2024-07-10 DIAGNOSIS — B351 Tinea unguium: Secondary | ICD-10-CM

## 2024-07-10 DIAGNOSIS — M79674 Pain in right toe(s): Secondary | ICD-10-CM

## 2024-07-10 DIAGNOSIS — E1149 Type 2 diabetes mellitus with other diabetic neurological complication: Secondary | ICD-10-CM | POA: Diagnosis not present

## 2024-07-10 DIAGNOSIS — M79675 Pain in left toe(s): Secondary | ICD-10-CM | POA: Diagnosis not present

## 2024-07-13 ENCOUNTER — Encounter: Payer: Self-pay | Admitting: Podiatry

## 2024-07-13 NOTE — Progress Notes (Signed)
  Subjective:  Patient ID: Jamie Finley, male    DOB: 03-08-1961,  MRN: [id number]  Jamie Finley presents to clinic today for at risk foot care with history of diabetic neuropathy and painful thick toenails that are difficult to trim. Pain interferes with ambulation. Aggravating factors include wearing enclosed shoe gear. Pain is relieved with periodic professional debridement.  Chief Complaint  Patient presents with   Toe Pain    Diabetic foot care. Dr. Avelina is his PCP. Last visit was in Oct. A1c 8.7   New problem(s): None.   PCP is Bedsole, Amy E, MD.  No Known Allergies  Review of Systems: Negative except as noted in the HPI.  Objective:  There were no vitals filed for this visit. Jamie Finley is a pleasant 63 y.o. male obese in NAD. AAO x 3.  Vascular Examination: Vascular status intact b/l with palpable pedal pulses. CFT immediate b/l. Pedal hair present. No edema. No pain with calf compression b/l. Skin temperature gradient WNL b/l. No varicosities noted. No cyanosis or clubbing noted.  Neurological Examination: Protective sensation diminished with 10g monofilament b/l.  Dermatological Examination: Evidence of old pencil lead injury to right great toe.  Pedal skin with normal turgor, texture and tone b/l. No open wounds nor interdigital macerations noted. Toenails 1-5 b/l thick, discolored, elongated with subungual debris and pain on dorsal palpation. No hyperkeratotic lesions noted b/l.   There is evidence of subacute subungual hematoma of the R 3rd toe. Nailplate remains adhered. There is no  tenderness to palpation.  Musculoskeletal Examination: Muscle strength 5/5 to LLE. Utilizes motorized chair for mobility assistance.  Radiographs: None  Assessment/Plan: 1. Pain due to onychomycosis of toenails of both feet   2. Type 2 diabetes mellitus with neurological complications [facility])   Patient was evaluated and treated. All patient's and/or POA's  questions/concerns addressed on today's visit. Mycotic toenails 1-5 b/l debrided in length and girth without incident.  Continue daily foot inspections and monitor blood glucose per PCP/Endocrinologist's recommendations.Continue soft, supportive shoe gear daily. Report any pedal injuries to medical professional. Call office if there are any quesitons/concerns. -Patient/POA to call should there be question/concern in the interim.   Return in about 3 months (around 10/10/2024).  Jamie Finley, DPM      [facility] LOCATION: [address], [place] [address]                   Office [phone]   [facility] LOCATION: [address] [place], [place] [address] Office [phone]

## 2024-07-16 ENCOUNTER — Telehealth

## 2024-07-16 ENCOUNTER — Other Ambulatory Visit: Payer: Self-pay

## 2024-07-16 NOTE — Patient Instructions (Signed)
 Visit Information  Thank you for taking time to visit with me today. Please don't hesitate to contact me if I can be of assistance to you before our next scheduled appointment.  Your next care management appointment is by telephone on 08/13/2024 at 3:00 pm  Telephone follow-up in 1 month  Please call the care guide team at [phone] if you need to cancel, schedule, or reschedule an appointment.   Please call the Suicide and Crisis Lifeline: 988 call the [place]  National Suicide Prevention Lifeline: (607)566-7197 or TTY: 843-821-8082 TTY 917-327-2299) to talk to a trained counselor call 1-800-273-TALK (toll free, 24 hour hotline) go to [facility] [address], [place] [phone]) call 911 if you are experiencing a Mental Health or Behavioral Health Crisis or need someone to talk to.  Nestora Duos, MSN, RN [facility], [facility] RN Care Manager Direct Dial: [phone] Fax: [phone]   Problems With Thinking and Memory (Mild Neurocognitive Disorder): What to Know Mild neurocognitive disorder, formerly known as mild cognitive impairment, is a disorder where your memory doesn't work as well as it should. It may also affect other mental abilities like thinking, communicating, behavior, and being able to finish tasks. These problems can be noticed and measured. But they usually don't stop you from doing daily activities or living on your own. Mild neurocognitive disorder usually happens after 63 years of age. But it can also happen at younger ages. It's not as serious as major neurocognitive disorder, also known as dementia, but it may be the first sign of it. In general, the symptoms of this condition get worse over time. In rare cases, symptoms can get better. What are the causes? This condition may be caused by: Brain disorders like Alzheimer's disease, Parkinson's disease, and other conditions that  slowly damage nerve cells. Diseases that affect the blood vessels in the brain and cause small strokes. Certain infections, like HIV. Traumatic brain injury. Other medical conditions, such as brain tumors, underactive thyroid  (hypothyroidism), and not having enough vitamin B12. Using certain drugs or medicines. What increases the risk? Being older than 63 years of age. Being male. Having a lower level of education. Diabetes, high blood pressure, high cholesterol, and other conditions that raise the risk for blood vessel diseases. Untreated or undertreated sleep apnea. Having a certain type of gene that can be inherited, or passed down from parent to child. Long-term health problems like heart disease, lung disease, liver disease, kidney disease, or depression. What are the signs or symptoms? Trouble remembering things. You may: Forget names, phone numbers, or details of recent events. Forget about social events and appointments. Often forget where you put your car keys or other items. Trouble thinking and solving problems. You may have trouble with complex tasks like: Paying bills. Driving in places you don't know well. Trouble communicating. You may have trouble: Finding the right word or naming an object. Forming a sentence that makes sense. Understanding what you read or hear. Changes in your behavior or personality. When this happens, you may: Lose interest in the things you used to enjoy. Avoid being around people. Get angry more easily than usual. Act before thinking. How is this diagnosed? This condition is diagnosed based on: Your symptoms. Your health care provider may ask you and the people you spend time with, like family and friends, about your symptoms. Memory tests and other tests to check how your brain is working. Your provider may refer you to a  provider called a neurologist or a mental health specialist. To try to find out the cause of your condition, your  provider may: Get a detailed medical history. Ask about use of alcohol, drugs, and medicines. Do a physical exam. Order blood tests and brain imaging tests. How is this treated? Mild neurocognitive disorder that's caused by medicine use, drug use, infection, or another medical condition may get better when the cause is treated, or when medicines or drugs are stopped. If this disorder has another cause, it usually doesn't improve and may get worse. In these cases, the goal of treatment is to help you manage the symptoms. This may include: Medicines to help with memory and behavior symptoms. Talk therapy. This provides education, emotional support, memory aids, and other ways of making up for problems with mental tasks. Lifestyle changes. These may include: Getting regular exercise. Eating a healthy diet that includes omega-3 fatty acids. Doing things to challenge your thinking and memory skills. Spending more time being with and talking to other people. Using routines like having regular times for meals and going to bed. Follow these instructions at home: Eating and drinking  Drink more fluids as told. Eat a healthy diet that includes omega-3 fatty acids. These can be found in: Fish. Nuts. Leafy vegetables. Vegetable oils. If you drink alcohol: Limit how much you have to: 0-1 drink a day if you're male. 0-2 drinks a day if you're male. Know how much alcohol is in your drink. In the [place] one drink is one 12 oz bottle of beer (355 mL), one 5 oz glass of wine (148 mL), or one 1 oz glass of hard liquor (44 mL). Lifestyle  Get regular exercise as told by your provider. Do not smoke, vape, or use nicotine or tobacco. Use healthy ways to manage stress. If you need help managing stress, ask your provider. Keep spending time with other people. Keep your mind active by doing activities you enjoy, like reading or playing games. Make sure you get good sleep at night. These tips can  help: Try not to take naps during the day. Keep your bedroom dark and cool. Do not exercise in the few hours before you go to bed. Do not have foods or drinks with caffeine at night. General instructions Take medicines only as told. Your provider may tell you to avoid taking medicines that can affect thinking. These include some medicines for pain or sleeping. Work with your provider to find out: What things you need help with. What your safety needs are. Where to find more information [employer] on Aging: baseringtones.pl Contact a health care provider if: You have any new symptoms. Get help right away if: You have new confusion or your confusion gets worse. You act in ways that put you or your family in danger. This information is not intended to replace advice given to you by your health care provider. Make sure you discuss any questions you have with your health care provider. Document Revised: 02/20/2023 Document Reviewed: 02/20/2023 Elsevier Patient Education  2024 Elsevier Inc.  Memory Compensation Strategies  Use WARM strategy.  W= write it down  A= associate it  R= repeat it  M= make a mental note  2.   You can keep a Glass Blower/designer.  Use a 3-ring notebook with sections for the following: calendar, important names and phone numbers,  medications, doctors' names/phone numbers, lists/reminders, and a section to journal what you did  each day.   3.  Use a calendar to write appointments down.  4.    Write yourself a schedule for the day.  This can be placed on the calendar or in a separate section of the Memory Notebook.  Keeping a  regular schedule can help memory.  5.    Use medication organizer with sections for each day or morning/evening pills.  You may need help loading it  6.    Keep a basket, or pegboard by the door.  Place items that you need to take out with you in the basket or on the pegboard.  You may also want to  include a message board for  reminders.  7.    Use sticky notes.  Place sticky notes with reminders in a place where the task is performed.  For example:  turn off the  stove placed by the stove, lock the door placed on the door at eye level,  take your medications on  the bathroom mirror or by the place where you normally take your medications.  8.    Use alarms/timers.  Use while cooking to remind yourself to check on food or as a reminder to take your medicine, or as a  reminder to make a call, or as a reminder to perform another task, etc.

## 2024-07-16 NOTE — Patient Outreach (Signed)
 Complex Care Management   Visit Note  07/16/2024  Name:  Jamie Finley MRN: [id number] DOB: [birth year]  Situation: Referral received for Complex Care Management related to Diabetes with Complications and Falls I obtained verbal consent from Patient.  Visit completed with Patient  on the phone  Background:   Past Medical History:  Diagnosis Date   Arthritis    Depression    Diabetes mellitus without complication (HCC)    Frequent headaches    Gout    Hyperlipidemia    Hypertension    Stroke [facility])     Assessment: Patient Reported Symptoms:  Cognitive Cognitive Status: Struggling with memory recall (forgetting how to do tie, concerning for him, will discuss with PCP at scheduled appointment and call if worse) Cognitive/Intellectual Conditions Management [RPT]: None reported or documented in medical history or problem list   Health Maintenance Behaviors: Annual physical exam, Healthy diet  Neurological Neurological Review of Symptoms: Headaches, Numbness Neurological Comment: headaches upon waking more frequent - getting sleep study for OSA, neuropathy, leg strength - continues to exercise, keeps cell phone on him in event falls/emergency  HEENT HEENT Symptoms Reported: No symptoms reported HEENT Comment: received new glasses    Cardiovascular Cardiovascular Symptoms Reported: Swelling in legs or feet Cardiovascular Comment: occasional leg swelling - not daily and not bad, limiting salt intake  Respiratory Respiratory Symptoms Reported: No symptoms reported Respiratory Management Strategies: Routine screening  Endocrine Endocrine Symptoms Reported: No symptoms reported List most recent blood sugar readings, include date and time of day: congratulated for decrease in A1C - has not been checking BG since doc appointment last month, limits carbs, exercise - last checked was around 130, reminded to check if not feeling well,    Gastrointestinal Gastrointestinal Symptoms Reported:  No symptoms reported Additional Gastrointestinal Details: miralax  prn Gastrointestinal Management Strategies: Medication therapy    Genitourinary Genitourinary Symptoms Reported: No symptoms reported    Integumentary      Musculoskeletal Musculoskelatal Symptoms Reviewed: Difficulty walking, Limited mobility, Unsteady gait, Weakness Additional Musculoskeletal Details: electric wc, reports fall no injury and no assist needed ot get up and carries cell phone at all times Musculoskeletal Management Strategies: Routine screening, Medical device Falls in the past year?: Yes Number of falls in past year: 2 or more Was there an injury with Fall?: Yes Fall Risk Category Calculator: 3 Patient Fall Risk Level: High Fall Risk Patient at Risk for Falls Due to: Impaired balance/gait, Impaired mobility, Orthopedic patient Fall risk Follow up: Falls evaluation completed, Falls prevention discussed  Psychosocial Psychosocial Symptoms Reported: No symptoms reported Additional Psychological Details: feeling good with Bupropion  - ran out a week ago and missed for 2 days, noticed depression, resumed, ref to [facility] for therapy - patient intends to follow up     Quality of Family Relationships: non-existent    07/16/2024    PHQ2-9 Depression Screening   Little interest or pleasure in doing things Several days  Feeling down, depressed, or hopeless Several days  PHQ-2 - Total Score 2  Trouble falling or staying asleep, or sleeping too much    Feeling tired or having little energy    Poor appetite or overeating     Feeling bad about yourself - or that you are a failure or have let yourself or your family down    Trouble concentrating on things, such as reading the newspaper or watching television    Moving or speaking so slowly that other people could have noticed.  Or the opposite - being  so fidgety or restless that you have been moving around a lot more than usual    Thoughts that you would be better off  dead, or hurting yourself in some way    PHQ2-9 Total Score    If you checked off any problems, how difficult have these problems made it for you to do your work, take care of things at home, or get along with other people    Depression Interventions/Treatment      There were no vitals filed for this visit.  Medications Reviewed Today     Reviewed by Devra Lands, RN (Registered Nurse) on 07/16/24 at 1541  Med List Status: <None>   Medication Order Taking? Sig Documenting Provider Last Dose Status Informant  Accu-Chek Softclix Lancets lancets [id number] Yes Use to check fasting blood sugar daily Bedsole, Amy E, MD  Active   ammonium lactate  (AMLACTIN) 12 % lotion [id number] Yes Apply 1 Application topically as needed for dry skin. Tobie Franky SQUIBB, DPM  Active   aspirin  81 MG tablet [id number] Yes Take 81 mg by mouth daily. Maree Leni Edyth DELENA, MD  Active Self  blood glucose meter kit and supplies [id number] Yes Dispense based on patient and insurance preference. Use to check blood sugar two times a day. Bedsole, Amy E, MD  Active   Blood Glucose Monitoring Suppl (ACCU-CHEK GUIDE) w/Device KIT [id number] Yes Use to check blood fasting blood sugar daily Bedsole, Amy E, MD  Active   buPROPion  (WELLBUTRIN  XL) 150 MG 24 hr tablet [id number] Yes Take 1 tablet (150 mg total) by mouth daily. Bedsole, Amy E, MD  Active   Cholecalciferol (VITAMIN D3) 50 MCG (2000 UT) TABS [id number] Yes Take 5-7 tablets by mouth daily. [provider]  Active   colchicine  0.6 MG tablet [id number] Yes Take 1 tablet (0.6 mg total) by mouth daily as needed. Avelina Greig BRAVO, MD  Active   diclofenac  Sodium (VOLTAREN ) 1 % GEL [id number] Yes Apply topically 4 (four) times daily.  Patient taking differently: Apply topically 4 (four) times daily. PRN   [provider]  Active Self  empagliflozin  (JARDIANCE ) 10 MG TABS tablet [id number] Yes Take 1 tablet (10 mg total) by mouth daily before breakfast. Avelina, Amy E, MD   Active   fluorometholone (FML) 0.1 % ophthalmic suspension [id number] Yes Place 1 drop into both eyes 4 (four) times daily. [provider]  Active            Med Note DORI, DAVINA E   Thu Jan 10, 2024 10:36 AM) Patient states he takes as needed.   glipiZIDE  (GLUCOTROL  XL) 10 MG 24 hr tablet [id number] Yes Take 1 tablet (10 mg total) by mouth daily with breakfast. Avelina, Amy E, MD  Active   glucose blood (ACCU-CHEK GUIDE) test strip [id number] Yes Use to check blood fasting blood sugar daily Bedsole, Amy E, MD  Active   hydrochlorothiazide  (HYDRODIURIL ) 25 MG tablet [id number] Yes Take 1 tablet (25 mg total) by mouth daily. Avelina Greig BRAVO, MD  Active   HYDROcodone -acetaminophen  (NORCO/VICODIN) 5-325 MG tablet [id number] Yes Take 1 tablet by mouth every 6 (six) hours as needed for moderate pain (pain score 4-6) or severe pain (pain score 7-10). Avelina Greig BRAVO, MD  Active   metFORMIN  (GLUCOPHAGE -XR) 500 MG 24 hr tablet [id number] Yes Take 2 tablets (1,000 mg total) by mouth 2 (two) times daily with a meal. Bedsole, Amy E, MD  Active   methocarbamol  (ROBAXIN ) 750 MG tablet [id number] Yes Take  1 tablet (750 mg total) by mouth daily as needed for muscle spasms. Avelina Greig BRAVO, MD  Active   mupirocin  ointment (BACTROBAN ) 2 % [id number] Yes Apply 1 Application topically 2 (two) times daily.  Patient taking differently: Apply 1 Application topically 2 (two) times daily. PRN   Bedsole, Amy E, MD  Active   Plecanatide  (TRULANCE ) 3 MG TABS [id number] Yes Take 1 tablet (3 mg total) by mouth daily. Jinny Carmine, MD  Active   polyethylene glycol (MIRALAX  / GLYCOLAX ) 17 g packet [id number] Yes Take 17 g by mouth daily.  Patient taking differently: Take 17 g by mouth daily. PRN   [provider]  Active   rosuvastatin  (CRESTOR ) 20 MG tablet [id number] Yes TAKE 1 TABLET AT BEDTIME Bedsole, Amy E, MD  Active   sildenafil  (REVATIO ) 20 MG tablet [id number] Yes Take 5 tablets prior to sexual activity  Patient  taking differently: Take 5 tablets prior to sexual activity. PRN   Bedsole, Amy E, MD  Active            Med Note DORI, DAVINA E   Fri Dec 07, 2023  9:22 AM) Patient states not taking.             Recommendation:   PCP Follow-up Continue Current Plan of Care  Follow Up Plan:   Telephone follow-up in 1 month  Nestora Duos, MSN, RN [facility]  [facility], [facility] RN Care Manager Direct Dial: [phone] Fax: [phone]

## 2024-07-21 ENCOUNTER — Ambulatory Visit

## 2024-07-22 ENCOUNTER — Encounter

## 2024-07-22 ENCOUNTER — Other Ambulatory Visit: Payer: Self-pay | Admitting: Family Medicine

## 2024-07-22 DIAGNOSIS — G4719 Other hypersomnia: Secondary | ICD-10-CM

## 2024-07-22 DIAGNOSIS — G4730 Sleep apnea, unspecified: Secondary | ICD-10-CM | POA: Diagnosis not present

## 2024-07-22 DIAGNOSIS — R0683 Snoring: Secondary | ICD-10-CM

## 2024-07-22 MED ORDER — GLIPIZIDE ER 10 MG PO TB24: 10.0000 mg | ORAL_TABLET | Freq: Every day | ORAL | 3 refills | Status: AC

## 2024-07-31 ENCOUNTER — Ambulatory Visit: Admitting: Clinical

## 2024-07-31 DIAGNOSIS — G4733 Obstructive sleep apnea (adult) (pediatric): Secondary | ICD-10-CM | POA: Diagnosis not present

## 2024-08-11 ENCOUNTER — Encounter: Payer: Self-pay | Admitting: Family Medicine

## 2024-08-13 ENCOUNTER — Other Ambulatory Visit: Payer: Self-pay

## 2024-08-13 ENCOUNTER — Other Ambulatory Visit: Payer: Self-pay | Admitting: Family Medicine

## 2024-08-13 ENCOUNTER — Ambulatory Visit: Payer: Self-pay

## 2024-08-13 DIAGNOSIS — Z0289 Encounter for other administrative examinations: Secondary | ICD-10-CM

## 2024-08-13 MED ORDER — EMPAGLIFLOZIN 10 MG PO TABS: 10.0000 mg | ORAL_TABLET | Freq: Every day | ORAL | 11 refills | Status: AC

## 2024-08-13 NOTE — Patient Outreach (Signed)
 Complex Care Management   Visit Note  08/13/2024  Name:  Jamie Finley MRN: [id number] DOB: [birth year]  Situation: Referral received for Complex Care Management related to Diabetes with Complications and Falls I obtained verbal consent from Patient.  Visit completed with Patient  on the phone  Background:   Past Medical History:  Diagnosis Date   Arthritis    Depression    Diabetes mellitus without complication (HCC)    Frequent headaches    Gout    Hyperlipidemia    Hypertension    Stroke [facility])     Assessment: Patient Reported Symptoms:  Cognitive Cognitive Status: No symptoms reported Cognitive/Intellectual Conditions Management [RPT]: None reported or documented in medical history or problem list      Neurological Neurological Review of Symptoms: Headaches, Numbness Neurological Comment: sleep study and follow up pending  HEENT HEENT Symptoms Reported: Not assessed      Cardiovascular Cardiovascular Symptoms Reported: No symptoms reported Cardiovascular Comment: reports episode last night woke up about an hour after going to sleep - felt like heart racing, had some anxiety , no other cardiac sx, report had spicy food prior, no further episodes, red flags for ED reviewed  Respiratory Respiratory Symptoms Reported: No symptoms reported Respiratory Management Strategies: Routine screening  Endocrine Endocrine Symptoms Reported: No symptoms reported Is patient diabetic?: Yes Is patient checking blood sugars at home?: Yes List most recent blood sugar readings, include date and time of day: no longer checking BG regularly, when checking states within normal limits, not feeling low or high, healthy diet    Gastrointestinal Gastrointestinal Symptoms Reported: Constipation Additional Gastrointestinal Details: miralax  prn Gastrointestinal Management Strategies: Medication therapy Gastrointestinal Comment: Trulance  - discussed taking before constipation severe     Genitourinary Genitourinary Symptoms Reported: Other Other Genitourinary Symptoms: patient reports 3 days lower left back pain, feels like prior kidney stones, less urine than usual with no change in hydration amount, denies pain with urination, no blood, conference call with PCP office triage to schedule acute appointment, Scheduled for Friday at 11am with Dr Avelina. Aware of sx to call provider and red flags for ED. Unable to complete full visit due to time needed for scheduling (disconnected in transfer to triage nurse).    Integumentary Other Integumentary Symptoms: has appointment with Derm 08/19/24 reports no change in areas    Musculoskeletal Musculoskelatal Symptoms Reviewed: Limited mobility, Difficulty walking, Unsteady gait, Weakness, Back pain Additional Musculoskeletal Details: electric wc, no falls carries cell phone at all times Musculoskeletal Management Strategies: Medical device Falls in the past year?: Yes Number of falls in past year: 2 or more Was there an injury with Fall?: Yes Fall Risk Category Calculator: 3 Patient Fall Risk Level: High Fall Risk Patient at Risk for Falls Due to: Impaired balance/gait, Impaired mobility, Orthopedic patient Fall risk Follow up: Falls evaluation completed, Falls prevention discussed  Psychosocial Psychosocial Symptoms Reported: No symptoms reported Additional Psychological Details: needs med refill, needs appointment with [facility], requested male and requires permission from prior therapist, RNCM wil lmessage PCP re seeing LCSW at [facility] Darice Ronde   Major Change/Loss/Stressor/Fears (CP): Medical condition, self      08/13/2024    PHQ2-9 Depression Screening   Little interest or pleasure in doing things    Feeling down, depressed, or hopeless    PHQ-2 - Total Score    Trouble falling or staying asleep, or sleeping too much    Feeling tired or having little energy    Poor appetite or overeating  Feeling bad about yourself -  or that you are a failure or have let yourself or your family down    Trouble concentrating on things, such as reading the newspaper or watching television    Moving or speaking so slowly that other people could have noticed.  Or the opposite - being so fidgety or restless that you have been moving around a lot more than usual    Thoughts that you would be better off dead, or hurting yourself in some way    PHQ2-9 Total Score    If you checked off any problems, how difficult have these problems made it for you to do your work, take care of things at home, or get along with other people    Depression Interventions/Treatment      There were no vitals filed for this visit.    Medications Reviewed Today   Medications were not reviewed in this encounter     Recommendation:   PCP Follow-up Acute PCP follow-up Friday 08/15/24 at 11 am. Continue Current Plan of Care  Follow Up Plan:   Telephone follow-up in 1 month  Nestora Duos, MSN, RN [facility]  [facility], [facility] RN Care Manager Direct Dial: [phone] Fax: [phone]

## 2024-08-13 NOTE — Patient Instructions (Signed)
 Visit Information  Thank you for taking time to visit with me today. Please don't hesitate to contact me if I can be of assistance to you before our next scheduled appointment.  Your next care management appointment is by telephone on 09/10/2024 at 1:00 pm  Telephone follow-up in 1 month  Please call the care guide team at [phone] if you need to cancel, schedule, or reschedule an appointment.   Please call the Suicide and Crisis Lifeline: 988 call the [place]  National Suicide Prevention Lifeline: (770)028-7520 or TTY: 425-028-0320 TTY 781-418-4635) to talk to a trained counselor call 1-800-273-TALK (toll free, 24 hour hotline) go to [facility] [address], [place] [phone]) call 911 if you are experiencing a Mental Health or Behavioral Health Crisis or need someone to talk to.  Nestora Duos, MSN, RN [place]  [facility], [facility] RN Care Manager Direct Dial: [phone] Fax: [phone]   Urinary Tract Infection, Male A urinary tract infection (UTI) is an infection in your urinary tract. The urinary tract is made up of the organs that make, store, and get rid of pee (urine) in your body. These organs include: The kidneys. The ureters. The bladder. The urethra. What are the causes? Most UTIs are caused by germs called bacteria. They may be in or near your genitals. These germs grow and cause swelling in your urinary tract. What increases the risk? You're more likely to get a UTI if: You have a soft tube called a catheter that drains your pee. You can't control when you pee or poop. You have trouble peeing because of: A prostate that's bigger than normal. A kidney stone. A urinary blockage. A nerve condition that affects your bladder. Not getting enough to drink. You're sexually active. You're an older adult. You're also more likely to get a UTI if you have other health problems, such  as: Diabetes. A weak immune system. Your immune system is your body's defense system. Sickle cell disease. Injury of the spine. What are the signs or symptoms? Symptoms may include: Needing to pee right away. Peeing small amounts often. Trouble getting the stream started. Pain or burning when you pee. Blood in your pee. Pee that smells bad or odd. Pain in your belly or lower back. You may also: Feel confused. This may be the first symptom in older adults. Vomit. Not feel hungry. Feel tired or easily annoyed. Have a fever or chills. How is this diagnosed? A UTI is diagnosed based on your medical history and an exam. You may also have other tests. These may include: Pee tests. Blood tests. Tests for sexually transmitted infections (STIs). If you've had more than one UTI, you may need to have imaging studies done to find out why you keep getting them. How is this treated? A UTI can be treated by: Taking antibiotics or other medicines. Drinking enough fluid to keep your pee pale yellow. In rare cases, a UTI can cause a very bad condition called sepsis. Sepsis may be treated in the hospital. Follow these instructions at home: Medicines Take your medicines only as told by your health care provider. If you were given antibiotics, take them as told by your provider. Do not stop taking them even if you start to feel better. General instructions Make sure you: Pee often and fully. Do not hold your pee for a long time. Pee after you have sex. Contact a health care provider if: Your symptoms don't get better after 1-2 days  of taking antibiotics. Your symptoms go away and then come back. You have a fever or chills. You vomit or feel like you may vomit. Get help right away if: You have very bad pain in your back or lower belly. You faint. This information is not intended to replace advice given to you by your health care provider. Make sure you discuss any questions you have with  your health care provider. Document Revised: 08/08/2023 Document Reviewed: 12/01/2022 Elsevier Patient Education  2025 [employer].

## 2024-08-13 NOTE — Telephone Encounter (Signed)
 FYI Only or Action Required?: FYI only for provider: appointment scheduled on 08/15/2024.  Patient was last seen in primary care on 06/27/2024 by Avelina Greig BRAVO, MD.  Called Nurse Triage reporting Back Pain.  Symptoms began several days ago.  Interventions attempted: Nothing.  Symptoms are: unchanged.  Triage Disposition: See PCP When Office is Open (Within 3 Days)  Patient/caregiver understands and will follow disposition?: Yes  Copied from CRM #[id number]. Topic: Clinical - Red Word Triage >> Aug 13, 2024  3:11 PM Robinson H wrote: [facility] that prompted transfer to Nurse Triage: Possible uti/kidney stone symptoms, pain in lower left back Reason for Disposition  [1] MODERATE back pain (e.g., interferes with normal activities) AND [2] present > 3 days  Answer Assessment - Initial Assessment Questions States back pain and decreased urinary output with same amount of intake. 1. ONSET: When did the pain begin? (e.g., minutes, hours, days)     Days ago 2. LOCATION: Where does it hurt? (upper, mid or lower back)     Left lower back 3. SEVERITY: How bad is the pain?  (e.g., Scale 1-10; mild, moderate, or severe)     5 4. PATTERN: Is the pain constant? (e.g., yes, no; constant, intermittent)      intermittent 5. RADIATION: Does the pain shoot into your legs or somewhere else?     denies 6. CAUSE:  What do you think is causing the back pain?      Unsure 7. BACK OVERUSE:  Any recent lifting of heavy objects, strenuous work or exercise?     Denies 8. MEDICINES: What have you taken so far for the pain? (e.g., nothing, acetaminophen , NSAIDS)     Denies 9. NEUROLOGIC SYMPTOMS: Do you have any weakness, numbness, or problems with bowel/bladder control?     Denies  Protocols used: Back Pain-A-AH

## 2024-08-13 NOTE — Telephone Encounter (Signed)
 Noted

## 2024-08-14 ENCOUNTER — Ambulatory Visit (INDEPENDENT_AMBULATORY_CARE_PROVIDER_SITE_OTHER): Payer: Self-pay | Admitting: Nurse Practitioner

## 2024-08-14 ENCOUNTER — Encounter (INDEPENDENT_AMBULATORY_CARE_PROVIDER_SITE_OTHER): Payer: Self-pay | Admitting: Nurse Practitioner

## 2024-08-14 VITALS — BP 115/65 | HR 83 | Temp 97.6°F | Ht 72.0 in | Wt 300.0 lb

## 2024-08-14 DIAGNOSIS — E1122 Type 2 diabetes mellitus with diabetic chronic kidney disease: Secondary | ICD-10-CM | POA: Diagnosis not present

## 2024-08-14 DIAGNOSIS — E1143 Type 2 diabetes mellitus with diabetic autonomic (poly)neuropathy: Secondary | ICD-10-CM | POA: Diagnosis not present

## 2024-08-14 DIAGNOSIS — Z6841 Body Mass Index (BMI) 40.0 and over, adult: Secondary | ICD-10-CM | POA: Diagnosis not present

## 2024-08-14 DIAGNOSIS — Z7984 Long term (current) use of oral hypoglycemic drugs: Secondary | ICD-10-CM

## 2024-08-14 DIAGNOSIS — G8220 Paraplegia, unspecified: Secondary | ICD-10-CM | POA: Diagnosis not present

## 2024-08-14 DIAGNOSIS — N182 Chronic kidney disease, stage 2 (mild): Secondary | ICD-10-CM

## 2024-08-14 DIAGNOSIS — E66813 Obesity, class 3: Secondary | ICD-10-CM | POA: Diagnosis not present

## 2024-08-14 DIAGNOSIS — E785 Hyperlipidemia, unspecified: Secondary | ICD-10-CM | POA: Diagnosis not present

## 2024-08-14 DIAGNOSIS — E1169 Type 2 diabetes mellitus with other specified complication: Secondary | ICD-10-CM

## 2024-08-14 DIAGNOSIS — Z7985 Long-term (current) use of injectable non-insulin antidiabetic drugs: Secondary | ICD-10-CM | POA: Diagnosis not present

## 2024-08-14 DIAGNOSIS — G834 Cauda equina syndrome: Secondary | ICD-10-CM

## 2024-08-14 NOTE — Progress Notes (Signed)
 [address] [place], [place], [place] [address] Office: [phone]  /  Fax: [phone]   Initial Consultation    Jamie Finley was seen in clinic today to evaluate for obesity. He is interested in losing weight to improve overall health and reduce the risk of weight related complications. He presents today to review program treatment options, initial physical assessment, and evaluation.    He does have Type 2 DM and checks fating BS and run in 130's. He is currently on Metformin  XR 500 mg 2 tabs BID, Glipizide  10 mg every day  and Jardiance  10 mg every day.  He tried Ozempic  and Rybelsus  in the past and had severe constipation(questionable gastroparesis) and is unable to take GLP1 medication.  He does have hyperlipidemia and is currently on Rosuvastatin  20 mg at bedtime. Denies side effects to medication.  He did have a minor stroke in 2017 and had some speech/writing difficulties and short term memory loss. He did work on therapy on his own and writing/speech difficulties have resolved.  He had caudal equina syndrome and until he had surgery he had paraparesis in both legs- weak calf and thigh muscles, difficulty with balance will use a 3 wheeled walker for short distances and has a motorized scooter that he mostly uses. He has very little sensation in his lower legs. He does have a history of gout and use colchicine  as needed.   He first noticed weight gain when [place]. He has a sedentary job as a horticulturist, commercial and noticed gradual weight gain throughout the years.  2009 lost weight after staph infection in legs that turned to sepsis, lots of nausea, vomiting- lost 20 pounds in the hospital then no appetite for about 9 months after.   Anthropometrics and Bioimpedance Analysis   Body mass index is 40.69 kg/m.  Obesity Related Diseases and Complications  Obesity Quality of Life and Psychosocial Complications: Body image dissatisfaction, Reduced  health-related quality of life, and Decrease physical activity and social participation  Cardiometabolic: Type 2 diabetes, Dyslipidemia or hypercholesterolemia, History of Stroke, DOE, and Fatigue  Biomechanical: Gout and Impaired mobility and gait dysfunction   Weight Related History  He was referred by: PCP  When asked what they would like to accomplish? He states: Adopt a healthier eating pattern and lifestyle, Improve energy levels and physical activity, Improve existing medical conditions, Improve quality of life, and Lose 25 lbs for his chair which is rated for 275 calories  Weight history: When [place] the states [place] in 1992 started to gain weight. 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   Highest weight: 355- 2009 lost weight after staph infection in legs that turned to sepsis, lots of nausea, vomiting- lost 20 pounds in the hospital then no appetite for about 9 months after.   Contributing factors: family history of obesity, disruption of circadian rhythm / sleep disordered breathing, consumption of processed foods, reduced physical activity, chronic skipping of meals, slow metabolism for age, multiple weight loss attempts in the past, sedentary job, and need for convenient foods  Prior weight loss attempts: Weight Watchers and Atkins- lost about 30-40 pounds but gained back  Current or previous pharmacotherapy: GLP-1 and Metformin   Response to medication: Had side effects so it was discontinued  Current nutrition plan: None- stopped sugar drinks  Greatest challenge with dieting: difficulty maintaining reduced calorie state and meal preparation and cooking.  Current level of physical activity: Limited due to chronic pain or orthopedic problems  Barriers to Exercise: orthopedic problems and chronic pain, papraparesis of lower legs. Brace on left leg. No sensation below the knees.  Readiness and Motivation  On a scale from 0 to  10 How ready are you to make changes to your eating and physical activity to lose weight? 9 How important is it for you to lose weight right now ? 10 How confident are you that you can lose weight if you try? 3  Past Medical History   Past Medical History:  Diagnosis Date   Arthritis    Depression    Diabetes mellitus without complication (HCC)    Frequent headaches    Gout    Hyperlipidemia    Hypertension    Stroke (HCC)      Objective    BP 115/65   Pulse 83   Temp 97.6 F (36.4 C)   Ht 6' (1.829 m)   Wt 300 lb (136.1 kg)   SpO2 97%   BMI 40.69 kg/m  He was weighed on the bioimpedance scale: Body mass index is 40.69 kg/m.    General:  Alert, oriented and cooperative. Patient is in no acute distress.  Respiratory: Normal respiratory effort, no problems with respiration noted   Gait: able to ambulate independently  Mental Status: Normal mood and affect. Normal behavior. Normal judgment and thought content.   Diagnostic Data Reviewed  BMET    Component Value Date/Time   NA 139 12/20/2023 1012   NA 141 01/06/2015 0411   K 3.8 12/20/2023 1012   K 4.0 01/06/2015 0411   CL 99 12/20/2023 1012   CL 104 01/06/2015 0411   CO2 30 12/20/2023 1012   CO2 30 01/06/2015 0411   GLUCOSE 110 (H) 12/20/2023 1012   GLUCOSE 115 (H) 01/06/2015 0411   BUN 19 12/20/2023 1012   BUN 20 01/06/2015 0411   CREATININE 1.07 12/20/2023 1012   CREATININE 0.92 06/13/2017 1512   CALCIUM  9.2 12/20/2023 1012   CALCIUM  9.4 01/06/2015 0411   GFRNONAA >60 02/11/2022 1953   GFRNONAA 93 06/13/2017 1512   GFRAA >60 02/11/2019 2336   GFRAA 107 06/13/2017 1512   Lab Results  Component Value Date   HGBA1C 6.6 (A) 06/27/2024   HGBA1C 8.1 (H) 01/16/2016   No results found for: INSULIN  CBC    Component Value Date/Time   WBC 12.4 (H) 02/20/2024 1328   RBC 5.48 02/20/2024 1328   HGB 15.2 02/20/2024 1328   HGB 13.9 01/06/2015 0411   HCT 45.4 02/20/2024 1328   HCT 41.6 01/06/2015 0411    PLT 283 02/20/2024 1328   PLT 226 01/06/2015 0411   MCV 82.8 02/20/2024 1328   MCV 88 01/06/2015 0411   MCH 27.7 02/20/2024 1328   MCHC 33.5 02/20/2024 1328   RDW 14.4 02/20/2024 1328   RDW 13.9 01/06/2015 0411   Iron/TIBC/Ferritin/ %Sat No results found for: IRON, TIBC, FERRITIN, IRONPCTSAT Lipid Panel     Component Value Date/Time   CHOL 145 12/20/2023 1012   TRIG 171.0 (H) 12/20/2023 1012   HDL 45.70 12/20/2023 1012   CHOLHDL 3 12/20/2023 1012   VLDL 34.2 12/20/2023 1012   LDLCALC 65 12/20/2023 1012   LDLCALC 151 (H) 06/13/2017 1512   LDLDIRECT 81.0 05/11/2022 0926   Hepatic Function Panel     Component Value Date/Time   PROT 7.3 12/20/2023 1012   PROT 7.7 01/06/2015 0411   ALBUMIN 4.5 12/20/2023 1012  ALBUMIN 4.3 01/06/2015 0411   AST 18 12/20/2023 1012   AST 30 01/06/2015 0411   ALT 16 12/20/2023 1012   ALT 35 01/06/2015 0411   ALKPHOS 59 12/20/2023 1012   ALKPHOS 55 01/06/2015 0411   BILITOT 0.9 12/20/2023 1012   BILITOT 0.8 01/06/2015 0411   BILIDIR 0.1 06/20/2018 0842      Component Value Date/Time   TSH 6.680 (H) 08/28/2022 1447    Medications  Outpatient Encounter Medications as of 08/14/2024  Medication Sig Note   Accu-Chek Softclix Lancets lancets Use to check fasting blood sugar daily    ammonium lactate  (AMLACTIN) 12 % lotion Apply 1 Application topically as needed for dry skin.    aspirin  81 MG tablet Take 81 mg by mouth daily.    blood glucose meter kit and supplies Dispense based on patient and insurance preference. Use to check blood sugar two times a day.    Blood Glucose Monitoring Suppl (ACCU-CHEK GUIDE) w/Device KIT Use to check blood fasting blood sugar daily    buPROPion  (WELLBUTRIN  XL) 150 MG 24 hr tablet Take 1 tablet (150 mg total) by mouth daily.    Cholecalciferol (VITAMIN D3) 50 MCG (2000 UT) TABS Take 5-7 tablets by mouth daily.    colchicine  0.6 MG tablet Take 1 tablet (0.6 mg total) by mouth daily as needed.     diclofenac  Sodium (VOLTAREN ) 1 % GEL Apply topically 4 (four) times daily. (Patient taking differently: Apply topically 4 (four) times daily. PRN)    empagliflozin  (JARDIANCE ) 10 MG TABS tablet Take 1 tablet (10 mg total) by mouth daily before breakfast.    fluorometholone (FML) 0.1 % ophthalmic suspension Place 1 drop into both eyes 4 (four) times daily. 01/10/2024: Patient states he takes as needed.    glipiZIDE  (GLUCOTROL  XL) 10 MG 24 hr tablet Take 1 tablet (10 mg total) by mouth daily with breakfast.    glucose blood (ACCU-CHEK GUIDE) test strip Use to check blood fasting blood sugar daily    hydrochlorothiazide  (HYDRODIURIL ) 25 MG tablet Take 1 tablet (25 mg total) by mouth daily.    HYDROcodone -acetaminophen  (NORCO/VICODIN) 5-325 MG tablet Take 1 tablet by mouth every 6 (six) hours as needed for moderate pain (pain score 4-6) or severe pain (pain score 7-10).    metFORMIN  (GLUCOPHAGE -XR) 500 MG 24 hr tablet Take 2 tablets (1,000 mg total) by mouth 2 (two) times daily with a meal.    methocarbamol  (ROBAXIN ) 750 MG tablet Take 1 tablet (750 mg total) by mouth daily as needed for muscle spasms.    mupirocin  ointment (BACTROBAN ) 2 % Apply 1 Application topically 2 (two) times daily.    Plecanatide  (TRULANCE ) 3 MG TABS Take 1 tablet (3 mg total) by mouth daily.    polyethylene glycol (MIRALAX  / GLYCOLAX ) 17 g packet Take 17 g by mouth daily. (Patient taking differently: Take 17 g by mouth daily. PRN)    rosuvastatin  (CRESTOR ) 20 MG tablet TAKE 1 TABLET AT BEDTIME    sildenafil  (REVATIO ) 20 MG tablet Take 5 tablets prior to sexual activity 12/07/2023: Patient states not taking.    No facility-administered encounter medications on file as of 08/14/2024.     Assessment and Plan   Type 2 diabetes mellitus with stage 2 chronic kidney disease, without long-term current use of insulin  (HCC) Limit simple carbohydrates, increase lean protein/fiber/water  Decreasing body weight by 10-15% can improve glucose  levels Continue to follow closely with PCP  Hyperlipidemia associated with type 2 diabetes mellitus (HCC) Limit  saturated fats and simple carbs, increase lean protein/water delvin Loss of 10-15% body weight can improve lipid levels Continue Rosuvastatin  20 mg at bedtime Continue to follow regularly with PCP  Paraparesis of both lower limbs (HCC)       Continue to use leg braces, use motorized scooter and 3 wheeled walker for ambulation       Monitor feet closely and follow with podiatrist regularly  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 Will work on eliminating or reducing the presence of highly palatable, calorie dense foods in the home. Will complete provided nutritional and psychosocial assessment questionnaire before the next appointment. Will be scheduled for indirect calorimetry to determine resting energy expenditure in a fasting state.  This will allow us  to create a reduced calorie, high-protein meal plan to promote loss of fat mass while preserving muscle mass. Counseled on the health benefits of losing 5%-15% of total body weight. Was counseled on nutritional approaches to weight loss and benefits of reducing processed foods and consuming plant-based foods and high quality protein as part of nutritional weight management. Was counseled on pharmacotherapy and role as an adjunct in  weight management.   Education and Additional resources  He was weighed on the bioimpedance scale and results were discussed and documented in the synopsis.  We discussed obesity as a progressive, chronic disease and the importance of a more detailed evaluation of all the factors contributing to the disease.  We reviewed the basic principles in obesity management.   We discussed the importance of long term lifestyle changes which include nutrition, exercise and behavioral modification as well as the importance of customizing this to his specific health and social needs.  We reviewed the role of medical interventions including pharmacotherapy and surgical interventions.   We discussed the benefits of reaching a healthier weight to alleviate the symptoms of existing conditions and reduce the risks of the biomechanical, cardiometabolic and psychological effects of obesity.  We reviewed our program approach and philosophy, which are guided by the four pillars of obesity medicine.  We discussed how to prepare for intake appointment and the importance of fasting and avoidance of stimulants for at least 8 hours prior to indirect calorimetry.  Jamie Finley appears to be in the action stage of change and reports being ready to initiate intensive lifestyle and behavioral modifications as part of their weight loss journey.  Attestation  Reviewed by clinician on day of visit: allergies, medications, problem list, medical history, surgical history, family history, social history, and previous encounter notes pertinent to obesity diagnosis.  I personally spent a total of 68 minutes in the care of the patient today including preparing to see the patient, getting/reviewing separately obtained history, performing a medically appropriate exam/evaluation, counseling and educating, and documenting clinical information in the EHR.  Jakaree Pickard ANP-C

## 2024-08-15 ENCOUNTER — Encounter: Payer: Self-pay | Admitting: Family Medicine

## 2024-08-15 ENCOUNTER — Ambulatory Visit: Admitting: Family Medicine

## 2024-08-15 VITALS — BP 110/68 | HR 99 | Temp 98.1°F | Ht 72.0 in

## 2024-08-15 DIAGNOSIS — R34 Anuria and oliguria: Secondary | ICD-10-CM | POA: Insufficient documentation

## 2024-08-15 DIAGNOSIS — E1122 Type 2 diabetes mellitus with diabetic chronic kidney disease: Secondary | ICD-10-CM | POA: Diagnosis not present

## 2024-08-15 DIAGNOSIS — M545 Low back pain, unspecified: Secondary | ICD-10-CM

## 2024-08-15 DIAGNOSIS — M62462 Contracture of muscle, left lower leg: Secondary | ICD-10-CM | POA: Insufficient documentation

## 2024-08-15 DIAGNOSIS — G8220 Paraplegia, unspecified: Secondary | ICD-10-CM

## 2024-08-15 LAB — POCT URINE DIPSTICK
Bilirubin, UA: NEGATIVE
Blood, UA: NEGATIVE
Glucose, UA: 100 mg/dL — AB
Ketones, POC UA: NEGATIVE mg/dL
Leukocytes, UA: NEGATIVE
Nitrite, UA: NEGATIVE
Spec Grav, UA: 1.025 (ref 1.010–1.025)
Urobilinogen, UA: 0.2 U/dL
pH, UA: 6 (ref 5.0–8.0)

## 2024-08-15 LAB — GLUCOSE, POCT (MANUAL RESULT ENTRY): POC Glucose: 151 mg/dL — AB (ref 70–99)

## 2024-08-15 NOTE — Assessment & Plan Note (Signed)
 Prescription sent to [facility] for left foot brace given current 1 has excessive wear.

## 2024-08-15 NOTE — Telephone Encounter (Signed)
 This patient needs to see a counselor... he is in my office today.  He was scheduled with Darice Seats in our office and this was cancelled.  He has seen Elvie before but is more comfortable with a male.  Why can he not see Darice Seats?... she is male and conveintly located at my office.  Please update me and the patient ASAP.  Thank you!  Greig Ring, MD [facility]

## 2024-08-15 NOTE — Progress Notes (Signed)
 Patient ID: Jamie Finley, male    DOB: [birth year], 63 y.o.   MRN: [id number]  This visit was conducted in person.  BP 110/68   Pulse 99   Temp 98.1 F (36.7 C) (Temporal)   Ht 6' (1.829 m)   SpO2 100%   BMI 40.69 kg/m    CC:  Chief Complaint  Patient presents with   Back Pain   Decreased Urine Output    Subjective:   HPI: Jamie Finley is a 63 y.o. male presenting on 08/15/2024 for Back Pain and Decreased Urine Output  Patient with history of neurogenic bladder secondary to history of cauda equina syndrome and paraparesis of both lower limbs.  Today he presents with mid back pain in last 4 days.  No radiation of pain into leg.  No proceeding injury, or fall.  Gradually worsened but was better in last 2 days.  Also noted decreased stream of urin, decreased frequency.  No dysuria, no urgency.  No blood in urine. No fever.  No tenderness in perineum.   Feels flushed, no chills.    Ibuprofen  400 mg helped.   He also needs a new foot brace for his foot drop in the left  lower extremity. Previous one has worn out.  Relevant past medical, surgical, family and social history reviewed and updated as indicated. Interim medical history since our last visit reviewed. Allergies and medications reviewed and updated. Outpatient Medications Prior to Visit  Medication Sig Dispense Refill   Accu-Chek Softclix Lancets lancets Use to check fasting blood sugar daily 100 each 3   ammonium lactate  (AMLACTIN) 12 % lotion Apply 1 Application topically as needed for dry skin. 400 g 0   aspirin  81 MG tablet Take 81 mg by mouth daily.     blood glucose meter kit and supplies Dispense based on patient and insurance preference. Use to check blood sugar two times a day. 1 each 0   Blood Glucose Monitoring Suppl (ACCU-CHEK GUIDE) w/Device KIT Use to check blood fasting blood sugar daily 1 kit 0   buPROPion  (WELLBUTRIN  XL) 150 MG 24 hr tablet Take 1 tablet (150 mg total) by mouth daily. 90  tablet 3   Cholecalciferol (VITAMIN D3) 50 MCG (2000 UT) TABS Take 5-7 tablets by mouth daily.     colchicine  0.6 MG tablet Take 1 tablet (0.6 mg total) by mouth daily as needed. 30 tablet 2   diclofenac  Sodium (VOLTAREN ) 1 % GEL Apply 2 g topically 4 (four) times daily as needed.     empagliflozin  (JARDIANCE ) 10 MG TABS tablet Take 1 tablet (10 mg total) by mouth daily before breakfast. 30 tablet 11   fluorometholone (FML) 0.1 % ophthalmic suspension Place 1 drop into both eyes 4 (four) times daily.     glipiZIDE  (GLUCOTROL  XL) 10 MG 24 hr tablet Take 1 tablet (10 mg total) by mouth daily with breakfast. 90 tablet 3   glucose blood (ACCU-CHEK GUIDE) test strip Use to check blood fasting blood sugar daily 100 each 3   hydrochlorothiazide  (HYDRODIURIL ) 25 MG tablet Take 1 tablet (25 mg total) by mouth daily. 90 tablet 3   HYDROcodone -acetaminophen  (NORCO/VICODIN) 5-325 MG tablet Take 1 tablet by mouth every 6 (six) hours as needed for moderate pain (pain score 4-6) or severe pain (pain score 7-10). 20 tablet 0   metFORMIN  (GLUCOPHAGE -XR) 500 MG 24 hr tablet Take 2 tablets (1,000 mg total) by mouth 2 (two) times daily with a meal. 360 tablet 3  methocarbamol  (ROBAXIN ) 750 MG tablet Take 1 tablet (750 mg total) by mouth daily as needed for muscle spasms. 30 tablet 1   mupirocin  ointment (BACTROBAN ) 2 % Apply 1 Application topically 2 (two) times daily. 15 g 0   Plecanatide  (TRULANCE ) 3 MG TABS Take 1 tablet (3 mg total) by mouth daily. 90 tablet 3   polyethylene glycol (MIRALAX  / GLYCOLAX ) 17 g packet Take 17 g by mouth daily as needed.     rosuvastatin  (CRESTOR ) 20 MG tablet TAKE 1 TABLET AT BEDTIME 90 tablet 1   sildenafil  (REVATIO ) 20 MG tablet Take 5 tablets prior to sexual activity 20 tablet 11   No facility-administered medications prior to visit.     Per HPI unless specifically indicated in ROS section below Review of Systems  Constitutional:  Negative for fatigue and fever.  HENT:   Negative for ear pain.   Eyes:  Negative for pain.  Respiratory:  Negative for cough and shortness of breath.   Cardiovascular:  Negative for chest pain, palpitations and leg swelling.  Gastrointestinal:  Negative for abdominal pain.  Genitourinary:  Positive for decreased urine volume. Negative for dysuria, frequency and urgency.  Musculoskeletal:  Negative for arthralgias.  Neurological:  Negative for syncope, light-headedness and headaches.  Psychiatric/Behavioral:  Negative for dysphoric mood.    Objective:  BP 110/68   Pulse 99   Temp 98.1 F (36.7 C) (Temporal)   Ht 6' (1.829 m)   SpO2 100%   BMI 40.69 kg/m   Wt Readings from Last 3 Encounters:  08/14/24 300 lb (136.1 kg)  07/07/24 (!) 305 lb (138.3 kg)  06/27/24 (!) 309 lb (140.2 kg)      Physical Exam Vitals reviewed.  Constitutional:      Appearance: He is well-developed.  HENT:     Head: Normocephalic.     Right Ear: Hearing normal.     Left Ear: Hearing normal.     Nose: Nose normal.  Neck:     Thyroid : No thyroid  mass or thyromegaly.     Vascular: No carotid bruit.     Trachea: Trachea normal.  Cardiovascular:     Rate and Rhythm: Normal rate and regular rhythm.     Pulses: Normal pulses.     Heart sounds: Heart sounds not distant. No murmur heard.    No friction rub. No gallop.     Comments: No peripheral edema Pulmonary:     Effort: Pulmonary effort is normal. No respiratory distress.     Breath sounds: Normal breath sounds.  Skin:    General: Skin is warm and dry.     Findings: No rash.  Neurological:     Motor: Weakness and atrophy present.     Coordination: Coordination abnormal.     Gait: Gait abnormal.     Comments: Patient currently wearing brace on the left foot given left ankle foot inversion/contracture  Psychiatric:        Speech: Speech normal.        Behavior: Behavior normal.        Thought Content: Thought content normal.       Results for orders placed or performed in visit on  08/15/24  POCT URINE DIPSTICK   Collection Time: 08/15/24 11:34 AM  Result Value Ref Range   Color, UA yellow yellow   Clarity, UA clear clear   Glucose, UA =100 (A) negative mg/dL   Bilirubin, UA negative negative   Ketones, POC UA negative negative mg/dL   Spec  Grav, UA 1.025 1.010 - 1.025   Blood, UA negative negative   pH, UA 6.0 5.0 - 8.0   POC PROTEIN,UA trace negative, trace   Urobilinogen, UA 0.2 0.2 or 1.0 E.U./dL   Nitrite, UA Negative Negative   Leukocytes, UA Negative Negative  POCT glucose (manual entry)   Collection Time: 08/15/24 12:19 PM  Result Value Ref Range   POC Glucose 151 (A) 70 - 99 mg/dl    Assessment and Plan  Decreased urine output -     POCT URINE DIPSTICK  Type 2 diabetes mellitus with stage 2 chronic kidney disease, without long-term current use of insulin  (HCC) Assessment & Plan: Chronic, given some flushing and glucose in urine in office today checked a blood sugar..  In normal range for nonfasting. Continue current regimen.  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   Paraparesis of both lower limbs (HCC)  Acute midline low back pain without sciatica Assessment & Plan: Acute, patient has had similar issue in the past.  Not clearly secondary to upper urinary tract infection.  No clear sign of kidney stone given urine clear.  Most likely secondary to musculoskeletal strain.  Use heat start home physical therapy, can use lidocaine  patches, can use ibuprofen  as needed on a limited basis.     No follow-ups on file.   Greig Ring, MD

## 2024-08-15 NOTE — Assessment & Plan Note (Signed)
 Chronic, given some flushing and glucose in urine in office today checked a blood sugar..  In normal range for nonfasting. Continue current regimen.

## 2024-08-15 NOTE — Assessment & Plan Note (Signed)
 Acute, patient has had similar issue in the past.  Not clearly secondary to upper urinary tract infection.  No clear sign of kidney stone given urine clear.  Most likely secondary to musculoskeletal strain.  Use heat start home physical therapy, can use lidocaine  patches, can use ibuprofen  as needed on a limited basis.

## 2024-08-19 ENCOUNTER — Encounter: Payer: Self-pay | Admitting: Dermatology

## 2024-08-19 ENCOUNTER — Ambulatory Visit: Admitting: Dermatology

## 2024-08-19 DIAGNOSIS — I872 Venous insufficiency (chronic) (peripheral): Secondary | ICD-10-CM | POA: Diagnosis not present

## 2024-08-19 DIAGNOSIS — L814 Other melanin hyperpigmentation: Secondary | ICD-10-CM | POA: Diagnosis not present

## 2024-08-19 DIAGNOSIS — L821 Other seborrheic keratosis: Secondary | ICD-10-CM | POA: Diagnosis not present

## 2024-08-19 DIAGNOSIS — Z1283 Encounter for screening for malignant neoplasm of skin: Secondary | ICD-10-CM | POA: Diagnosis not present

## 2024-08-19 DIAGNOSIS — L578 Other skin changes due to chronic exposure to nonionizing radiation: Secondary | ICD-10-CM | POA: Diagnosis not present

## 2024-08-19 DIAGNOSIS — W908XXA Exposure to other nonionizing radiation, initial encounter: Secondary | ICD-10-CM | POA: Diagnosis not present

## 2024-08-19 DIAGNOSIS — D1801 Hemangioma of skin and subcutaneous tissue: Secondary | ICD-10-CM | POA: Diagnosis not present

## 2024-08-19 DIAGNOSIS — L918 Other hypertrophic disorders of the skin: Secondary | ICD-10-CM | POA: Diagnosis not present

## 2024-08-19 NOTE — Patient Instructions (Addendum)
 Seborrheic Keratosis  What causes seborrheic keratoses? Seborrheic keratoses are harmless, common skin growths that first appear during adult life.  As time goes by, more growths appear.  Some people may develop a large number of them.  Seborrheic keratoses appear on both covered and uncovered body parts.  They are not caused by sunlight.  The tendency to develop seborrheic keratoses can be inherited.  They vary in color from skin-colored to gray, brown, or even black.  They can be either smooth or have a rough, warty surface.   Seborrheic keratoses are superficial and look as if they were stuck on the skin.  Under the microscope this type of keratosis looks like layers upon layers of skin.  That is why at times the top layer may seem to fall off, but the rest of the growth remains and re-grows.    Treatment Seborrheic keratoses do not need to be treated, but can easily be removed in the office.  Seborrheic keratoses often cause symptoms when they rub on clothing or jewelry.  Lesions can be in the way of shaving.  If they become inflamed, they can cause itching, soreness, or burning.  Removal of a seborrheic keratosis can be accomplished by freezing, burning, or surgery. If any spot bleeds, scabs, or grows rapidly, please return to have it checked, as these can be an indication of a skin cancer.   Melanoma ABCDEs  Melanoma is the most dangerous type of skin cancer, and is the leading cause of death from skin disease.  You are more likely to develop melanoma if you: Have light-colored skin, light-colored eyes, or red or blond hair Spend a lot of time in the sun Tan regularly, either outdoors or in a tanning bed Have had blistering sunburns, especially during childhood Have a close family member who has had a melanoma Have atypical moles or large birthmarks  Early detection of melanoma is key since treatment is typically straightforward and cure rates are extremely high if we catch it early.    The first sign of melanoma is often a change in a mole or a new dark spot.  The ABCDE system is a way of remembering the signs of melanoma.  A for asymmetry:  The two halves do not match. B for border:  The edges of the growth are irregular. C for color:  A mixture of colors are present instead of an even brown color. D for diameter:  Melanomas are usually (but not always) greater than 6mm - the size of a pencil eraser. E for evolution:  The spot keeps changing in size, shape, and color.  Please check your skin once per month between visits. You can use a small mirror in front and a large mirror behind you to keep an eye on the back side or your body.   If you see any new or changing lesions before your next follow-up, please call to schedule a visit.  Please continue daily skin protection including broad spectrum sunscreen SPF 30+ to sun-exposed areas, reapplying every 2 hours as needed when you're outdoors.   Staying in the shade or wearing long sleeves, sun glasses (UVA+UVB protection) and wide brim hats (4-inch brim around the entire circumference of the hat) are also recommended for sun protection.    Due to recent changes in healthcare laws, you may see results of your pathology and/or laboratory studies on MyChart before the doctors have had a chance to review them. We understand that in some cases there may  be results that are confusing or concerning to you. Please understand that not all results are received at the same time and often the doctors may need to interpret multiple results in order to provide you with the best plan of care or course of treatment. Therefore, we ask that you please give us  2 business days to thoroughly review all your results before contacting the office for clarification. Should we see a critical lab result, you will be contacted sooner.   If You Need Anything After Your Visit  If you have any questions or concerns for your doctor, please call our main  line at [phone] and press option 4 to reach your doctor's medical assistant. If no one answers, please leave a voicemail as directed and we will return your call as soon as possible. Messages left after 4 pm will be answered the following business day.   You may also send us  a message via MyChart. We typically respond to MyChart messages within 1-2 business days.  For prescription refills, please ask your pharmacy to contact our office. Our fax number is [phone].  If you have an urgent issue when the clinic is closed that cannot wait until the next business day, you can page your doctor at the number below.    Please note that while we do our best to be available for urgent issues outside of office hours, we are not available 24/7.   If you have an urgent issue and are unable to reach us , you may choose to seek medical care at your doctor's office, retail clinic, urgent care center, or emergency room.  If you have a medical emergency, please immediately call 911 or go to the emergency department.  Pager Numbers  - Dr. Hester: [phone]  - Dr. Jackquline: [phone]  - Dr. Claudene: [phone]   - Dr. Raymund: [phone]  In the event of inclement weather, please call our main line at [phone] for an update on the status of any delays or closures.  Dermatology Medication Tips: Please keep the boxes that topical medications come in in order to help keep track of the instructions about where and how to use these. Pharmacies typically print the medication instructions only on the boxes and not directly on the medication tubes.   If your medication is too expensive, please contact our office at [phone] option 4 or send us  a message through MyChart.   We are unable to tell what your co-pay for medications will be in advance as this is different depending on your insurance coverage. However, we may be able to find a substitute medication at lower cost or fill out  paperwork to get insurance to cover a needed medication.   If a prior authorization is required to get your medication covered by your insurance company, please allow us  1-2 business days to complete this process.  Drug prices often vary depending on where the prescription is filled and some pharmacies may offer cheaper prices.  The website www.goodrx.com contains coupons for medications through different pharmacies. The prices here do not account for what the cost may be with help from insurance (it may be cheaper with your insurance), but the website can give you the price if you did not use any insurance.  - You can print the associated coupon and take it with your prescription to the pharmacy.  - You may also stop by our office during regular business hours and pick up a GoodRx coupon card.  - If you need  your prescription sent electronically to a different pharmacy, notify our office through [facility] or by phone at [phone] option 4.     Si Usted Necesita Algo Despus de Su Visita  Tambin puede enviarnos un mensaje a travs de Clinical cytogeneticist. Por lo general respondemos a los mensajes de MyChart en el transcurso de 1 a 2 das hbiles.  Para renovar recetas, por favor pida a su farmacia que se ponga en contacto con nuestra oficina. Randi lakes de fax es [place] [phone].  Si tiene un asunto urgente cuando la clnica est cerrada y que no puede esperar hasta el siguiente da hbil, puede llamar/localizar a su doctor(a) al nmero que aparece a continuacin.   Por favor, tenga en cuenta que aunque hacemos todo lo posible para estar disponibles para asuntos urgentes fuera del horario de [place], no estamos disponibles las 24 horas del da, los 7 [address]  [address] de la [place].   Si tiene un problema urgente y no puede comunicarse con nosotros, puede optar por buscar atencin mdica  en el consultorio de su doctor(a), en una clnica privada, en un centro de atencin urgente o en una sala de  emergencias.  Si tiene Engineer, drilling, por favor llame inmediatamente al 911 o vaya a la sala de emergencias.  Nmeros de bper  - Dr. Hester: [phone]  - Dra. Jackquline: [phone]  - Dr. Claudene: [phone]  - Dra. Kitts: [phone]  En caso de inclemencias del [place], por favor llame a nuestra lnea principal al [phone] para una actualizacin sobre el estado de cualquier retraso o cierre.  Consejos para la medicacin en dermatologa: Por favor, guarde las cajas en las que vienen los medicamentos de uso tpico para ayudarle a seguir las instrucciones sobre dnde y cmo usarlos. Las farmacias generalmente imprimen las instrucciones del medicamento slo en las cajas y no directamente en los tubos del [place].   Si su medicamento es muy caro, por favor, pngase en contacto con landry rieger llamando al [phone] y presione la opcin 4 o envenos un mensaje a travs de Clinical cytogeneticist.   No podemos decirle cul ser su copago por los medicamentos por adelantado ya que esto es diferente dependiendo de la cobertura de su seguro. Sin embargo, es posible que podamos encontrar un medicamento sustituto a Audiological scientist un formulario para que el seguro cubra el medicamento que se considera necesario.   Si se requiere una autorizacin previa para que su compaa de seguros malta su medicamento, por favor permtanos de 1 a 2 das hbiles para completar este proceso.  Los precios de los medicamentos varan con frecuencia dependiendo del Environmental consultant de dnde se surte la receta y alguna farmacias pueden ofrecer precios ms baratos.  El sitio web www.goodrx.com tiene cupones para medicamentos de Health and safety inspector. Los precios aqu no tienen en cuenta lo que podra costar con la ayuda del seguro (puede ser ms barato con su seguro), pero el sitio web puede darle el precio si no utiliz Tourist information centre manager.  - Puede imprimir el cupn correspondiente y llevarlo con su receta a la  farmacia.  - Tambin puede pasar por nuestra oficina durante el horario de atencin regular y Education officer, museum una tarjeta de cupones de GoodRx.  - Si necesita que su receta se enve electrnicamente a una farmacia diferente, informe a nuestra oficina a travs de MyChart de [place] o por telfono llamando al [phone] y presione la opcin 4.

## 2024-08-19 NOTE — Progress Notes (Unsigned)
 New Patient Visit   Subjective  Jamie Finley is a 63 y.o. male who presents for the following: Skin Cancer Screening and Full Body Skin Exam.   Patient states he would like to be evaluated for skin tags present on groin, buttocks, bilateral axilla. He also states skin spots concerned about what they could be on bilateral wrists, L temple R jaw. Hx Type II diabetes.  The patient presents for Total-Body Skin Exam (TBSE) for skin cancer screening and mole check. The patient has spots, moles and lesions to be evaluated, some may be new or changing and the patient may have concern these could be cancer.    The following portions of the chart were reviewed this encounter and updated as appropriate: medications, allergies, medical history  Review of Systems:  No other skin or systemic complaints except as noted in HPI or Assessment and Plan.  Objective  Well appearing patient in no apparent distress; mood and affect are within normal limits.  A full examination was performed including scalp, head, eyes, ears, nose, lips, neck, chest, axillae, abdomen, back, buttocks, bilateral upper extremities, bilateral lower extremities, hands, feet, fingers, toes, fingernails, and toenails. All findings within normal limits unless otherwise noted below.   Relevant physical exam findings are noted in the Assessment and Plan.    Assessment & Plan   SKIN CANCER SCREENING PERFORMED TODAY.  ACTINIC DAMAGE - Chronic condition, secondary to cumulative UV/sun exposure - diffuse scaly erythematous macules with underlying dyspigmentation - Recommend daily broad spectrum sunscreen SPF 30+ to sun-exposed areas, reapply every 2 hours as needed.  - Staying in the shade or wearing long sleeves, sun glasses (UVA+UVB protection) and wide brim hats (4-inch brim around the entire circumference of the hat) are also recommended for sun protection.  - Call for new or changing lesions.  LENTIGINES, SEBORRHEIC  KERATOSES, HEMANGIOMAS - Benign normal skin lesions - Benign-appearing - Call for any changes  SEBORRHEIC KERATOSIS - Stuck-on, waxy, tan-brown papules and/or plaques at temple, right dorsal hand, left flank, back - Benign-appearing - Discussed benign etiology and prognosis. - Observe - Call for any changes  MELANOCYTIC NEVI - Tan-brown and/or pink-flesh-colored symmetric macules and papules - Benign appearing on exam today - Observation - Call clinic for new or changing moles - Recommend daily use of broad spectrum spf 30+ sunscreen to sun-exposed areas.   Acrochordons (Skin Tags) - Fleshy, skin-colored pedunculated papules right axilla, buttocks  - Benign appearing.  - Observe. - If desired, they can be removed with an in office procedure that is not covered by insurance. - Please call the clinic if you notice any new or changing lesions.   VENOUS STASIS DERMATITIS Exam: Erythematous, scaly patches involving the ankle and distal lower leg with associated lower leg edema.  Chronic and persistent condition with duration or expected duration over one year. Condition is bothersome/symptomatic for patient. Currently flared.   Stasis in the legs causes chronic leg swelling, which may result in itchy or painful rashes, skin discoloration, skin texture changes, and sometimes ulceration.  Recommend daily graduated compression hose/stockings- easiest to put on first thing in morning, remove at bedtime.  Elevate legs as much as possible. Avoid salt/sodium rich foods.  Treatment Plan: Recommend wearing compression socks daily. Advised that if left untreated can cause open wounds.     Return for skin tag removal , w/ Dr. Claudene.  IAlmetta Nora, RMA, am acting as scribe for Boneta Claudene, MD .  Documentation: I have reviewed the  above documentation for accuracy and completeness, and I agree with the above.  Boneta Sharps, MD

## 2024-08-25 ENCOUNTER — Encounter: Payer: Self-pay | Admitting: Nurse Practitioner

## 2024-08-25 ENCOUNTER — Ambulatory Visit: Admitting: Nurse Practitioner

## 2024-08-25 VITALS — BP 110/66 | HR 70 | Temp 97.6°F | Ht 72.0 in | Wt 300.0 lb

## 2024-08-25 DIAGNOSIS — E66813 Obesity, class 3: Secondary | ICD-10-CM

## 2024-08-25 DIAGNOSIS — Z6841 Body Mass Index (BMI) 40.0 and over, adult: Secondary | ICD-10-CM

## 2024-08-25 DIAGNOSIS — G478 Other sleep disorders: Secondary | ICD-10-CM

## 2024-08-25 DIAGNOSIS — G4733 Obstructive sleep apnea (adult) (pediatric): Secondary | ICD-10-CM | POA: Insufficient documentation

## 2024-08-25 NOTE — Assessment & Plan Note (Signed)
 Possibly due to untreated OSA. Will monitor for resolution with management.

## 2024-08-25 NOTE — Assessment & Plan Note (Signed)
 Moderate OSA. Reviewed risks of untreated OSA and potential treatment options. Shared decision to retrial CPAP while working on healthy weight loss measures. We also discussed hypoglossal nerve stimulator implant and oral appliance. Educated on proper use/care of CPAP. Risks/benefits reviewed. New start auto 5-20 cmH2O, mask of choice and heated humidity order placed. Safe driving practices reviewed.  Patient Instructions  Start CPAP every night, minimum of 4-6 hours a night.  Change equipment as directed. Wash your tubing with warm soap and water  daily, hang to dry. Wash humidifier portion weekly. Use bottled, distilled water  and change daily Be aware of reduced alertness and do not drive or operate heavy machinery if experiencing this or drowsiness.  Exercise encouraged, as tolerated. Healthy weight management discussed.  Avoid or decrease alcohol consumption and medications that make you more sleepy, if possible. Notify if persistent daytime sleepiness occurs even with consistent use of PAP therapy.  Change CPAP supplies... Every month Mask cushions and/or nasal pillows CPAP machine filters Every 3 months Mask frame (not including the headgear) CPAP tubing Every 6 months Mask headgear Chin strap (if applicable) Humidifier water  tub   We discussed how untreated sleep apnea puts an individual at risk for cardiac arrhthymias, pulm HTN, DM, stroke and increases their risk for daytime accidents. We also briefly reviewed treatment options including weight loss, side sleeping position, oral appliance, CPAP therapy or referral to ENT for possible surgical options  Keep working on healthy weight loss measures   Follow up in 10-12 weeks with Dr. Jess to review CPAP, or sooner, if needed

## 2024-08-25 NOTE — Progress Notes (Signed)
 @Patient  ID: Jamie Finley, male    DOB: [birth year], 63 y.o.   MRN: [id number]  Chief Complaint  Patient presents with   Obstructive Sleep Apnea    Completed home sleep test on 07/22/2024.     Referring provider: Avelina Greig BRAVO, MD  HPI: 63 year old male, former smoker followed for OSA. Past medical history significant for HTN, DM, cardiomegaly, DM, HLD, paraparesis of BLE, depression, hx of DVT, hx of CVA, ED, obesity.   TEST/EVENTS:  07/22/2024 HST: AHI 16.1/h, SpO2 low 77%  07/07/2024: OV with Prosper Paff NP History of Present Illness Jamie Finley is a 63 year old male who presents with suspected sleep apnea and episodes of sleep paralysis. He experiences episodes of sleep paralysis, characterized by waking up unable to move or speak, often occurring when lying on his back. He attempts to move during these episodes but is unable to do so until he fully wakes up. To mitigate this, he uses an elevated bed and prefers sleeping on his side. He has gasping episodes after these episodes. Believes he stops breathing at night.  He has been informed by others that he snores and possibly stops breathing during sleep. During the day, he experiences tiredness about half of the time and reports frequent nighttime awakenings, often to change positions or urinate once or twice. He occasionally wakes up with a dry mouth, which he attributes to diabetes, and sometimes experiences significant headaches upon waking. He rarely has issues with drowsy driving, usually only if he hasn't slept. Never fallen asleep while driving. He has not undergone a previous sleep study and does not currently take any medication for sleep. He attempted to use a CPAP machine provided by his aunt and uncle but found it uncomfortable and was unable to use it effectively.  He goes to bed around 1230 am. Takes him a while to fall asleep, sometimes 1-2 hours. Wakes up 3-4 times a night. Gets up around 9 or 10 am. No significant  weight change. No heavy machinery. Retired. Lives alone. Rare alcohol use. No excessive caffeine. No sleep aids.     07/07/2024    1:36 PM  Results of the Epworth flowsheet  Sitting and reading 1  Watching TV 2  Sitting, inactive in a public place (e.g. a theatre or a meeting) 2  As a passenger in a car for an hour without a break 1  Lying down to rest in the afternoon when circumstances permit 3  Sitting and talking to someone 1  Sitting quietly after a lunch without alcohol 2  In a car, while stopped for a few minutes in traffic 1  Total score 13     08/25/2024: Today -  follow up Discussed the use of AI scribe software for clinical note transcription with the patient, who gave verbal consent to proceed.  [name] is a 63 year old male with moderate sleep apnea who presents for a follow-up on sleep study results and management options.  He previously attempted to use a CPAP machine provided by his aunt and uncle but found it uncomfortable due to issues with the mask and humidity settings so he stopped using it years ago. He never had the settings adjusted or tried alternative masks.   He feels unchanged compared to his last visit. No drowsy driving.   He has recently joined a medical weight loss program. His current BMI is 40, and he aims to reduce his weight to 250 pounds as an initial goal.  He typically sleeps with his bed elevated due to sleep apnea/paralysis and notes that he sleeps more soundly when doing so.     Allergies  Allergen Reactions   Ozempic  (0.25 Or 0.5 Mg-Dose) [Semaglutide (0.25 Or 0.5mg -Dos)] Other (See Comments)    He had severe constipation ? Gastoparesis, also with Rybelsus     Immunization History  Administered Date(s) Administered   Moderna Sars-Covid-2 Vaccination 05/03/2020, 06/07/2020   Td 01/03/2024    Past Medical History:  Diagnosis Date   Arthritis    Depression    Diabetes mellitus without complication (HCC)    Frequent  headaches    Gout    Hyperlipidemia    Hypertension    Stroke (HCC)     Tobacco History: Social History   Tobacco Use  Smoking Status Former   Current packs/day: 0.00   Average packs/day: 1 pack/day for 5.0 years (5.0 ttl pk-yrs)   Types: Cigarettes   Start date: 09/11/1984   Quit date: 09/11/1989   Years since quitting: 34.9  Smokeless Tobacco Never   Counseling given: Not Answered   Outpatient Medications Prior to Visit  Medication Sig Dispense Refill   Accu-Chek Softclix Lancets lancets Use to check fasting blood sugar daily 100 each 3   ammonium lactate  (AMLACTIN) 12 % lotion Apply 1 Application topically as needed for dry skin. 400 g 0   aspirin  81 MG tablet Take 81 mg by mouth daily.     blood glucose meter kit and supplies Dispense based on patient and insurance preference. Use to check blood sugar two times a day. 1 each 0   Blood Glucose Monitoring Suppl (ACCU-CHEK GUIDE) w/Device KIT Use to check blood fasting blood sugar daily 1 kit 0   buPROPion  (WELLBUTRIN  XL) 150 MG 24 hr tablet Take 1 tablet (150 mg total) by mouth daily. 90 tablet 3   Cholecalciferol (VITAMIN D3) 50 MCG (2000 UT) TABS Take 5-7 tablets by mouth daily.     colchicine  0.6 MG tablet Take 1 tablet (0.6 mg total) by mouth daily as needed. 30 tablet 2   diclofenac  Sodium (VOLTAREN ) 1 % GEL Apply 2 g topically 4 (four) times daily as needed.     empagliflozin  (JARDIANCE ) 10 MG TABS tablet Take 1 tablet (10 mg total) by mouth daily before breakfast. 30 tablet 11   fluorometholone (FML) 0.1 % ophthalmic suspension Place 1 drop into both eyes 4 (four) times daily.     glipiZIDE  (GLUCOTROL  XL) 10 MG 24 hr tablet Take 1 tablet (10 mg total) by mouth daily with breakfast. 90 tablet 3   glucose blood (ACCU-CHEK GUIDE) test strip Use to check blood fasting blood sugar daily 100 each 3   hydrochlorothiazide  (HYDRODIURIL ) 25 MG tablet Take 1 tablet (25 mg total) by mouth daily. 90 tablet 3   HYDROcodone -acetaminophen   (NORCO/VICODIN) 5-325 MG tablet Take 1 tablet by mouth every 6 (six) hours as needed for moderate pain (pain score 4-6) or severe pain (pain score 7-10). 20 tablet 0   metFORMIN  (GLUCOPHAGE -XR) 500 MG 24 hr tablet Take 2 tablets (1,000 mg total) by mouth 2 (two) times daily with a meal. 360 tablet 3   methocarbamol  (ROBAXIN ) 750 MG tablet Take 1 tablet (750 mg total) by mouth daily as needed for muscle spasms. 30 tablet 1   mupirocin  ointment (BACTROBAN ) 2 % Apply 1 Application topically 2 (two) times daily. 15 g 0   Plecanatide  (TRULANCE ) 3 MG TABS Take 1 tablet (3 mg total) by mouth daily. 90 tablet 3  polyethylene glycol (MIRALAX  / GLYCOLAX ) 17 g packet Take 17 g by mouth daily as needed.     rosuvastatin  (CRESTOR ) 20 MG tablet TAKE 1 TABLET AT BEDTIME 90 tablet 1   sildenafil  (REVATIO ) 20 MG tablet Take 5 tablets prior to sexual activity 20 tablet 11   No facility-administered medications prior to visit.     Review of Systems: as above    Physical Exam:  BP 110/66   Pulse 70   Temp 97.6 F (36.4 C) (Temporal)   Ht 6' (1.829 m)   Wt 300 lb (136.1 kg)   SpO2 97%   BMI 40.69 kg/m   GEN: Pleasant, interactive, well-kempt; morbidly obese; in no acute distress HEENT:  Normocephalic and atraumatic. PERRLA. Sclera white. Nasal turbinates pink, moist and patent bilaterally. No rhinorrhea present. Oropharynx pink and moist, without exudate or edema. No lesions, ulcerations, or postnasal drip. Mallampati III/IV NECK:  Supple w/ fair ROM. Thyroid  symmetrical with no goiter or nodules palpated.  CV: RRR, no m/r/g PULMONARY:  Unlabored, regular breathing. Clear bilaterally A&P w/o wheezes/rales/rhonchi. No accessory muscle use.  GI: BS present and normoactive. Soft, non-tender to palpation. MSK: No erythema, warmth or tenderness.  Neuro: A/Ox3. No focal deficits noted.   Skin: Warm, no lesions or rashe Psych: Normal affect and behavior. Judgement and thought content appropriate.      Lab Results:  CBC    Component Value Date/Time   WBC 12.4 (H) 02/20/2024 1328   RBC 5.48 02/20/2024 1328   HGB 15.2 02/20/2024 1328   HGB 13.9 01/06/2015 0411   HCT 45.4 02/20/2024 1328   HCT 41.6 01/06/2015 0411   PLT 283 02/20/2024 1328   PLT 226 01/06/2015 0411   MCV 82.8 02/20/2024 1328   MCV 88 01/06/2015 0411   MCH 27.7 02/20/2024 1328   MCHC 33.5 02/20/2024 1328   RDW 14.4 02/20/2024 1328   RDW 13.9 01/06/2015 0411   LYMPHSABS 3.4 07/13/2021 0409   LYMPHSABS 2.4 02/05/2014 1024   MONOABS 0.9 07/13/2021 0409   MONOABS 0.7 02/05/2014 1024   EOSABS 112 02/20/2024 1328   EOSABS 0.2 02/05/2014 1024   BASOSABS 74 02/20/2024 1328   BASOSABS 0.1 02/05/2014 1024    BMET    Component Value Date/Time   NA 139 12/20/2023 1012   NA 141 01/06/2015 0411   K 3.8 12/20/2023 1012   K 4.0 01/06/2015 0411   CL 99 12/20/2023 1012   CL 104 01/06/2015 0411   CO2 30 12/20/2023 1012   CO2 30 01/06/2015 0411   GLUCOSE 110 (H) 12/20/2023 1012   GLUCOSE 115 (H) 01/06/2015 0411   BUN 19 12/20/2023 1012   BUN 20 01/06/2015 0411   CREATININE 1.07 12/20/2023 1012   CREATININE 0.92 06/13/2017 1512   CALCIUM  9.2 12/20/2023 1012   CALCIUM  9.4 01/06/2015 0411   GFRNONAA >60 02/11/2022 1953   GFRNONAA 93 06/13/2017 1512   GFRAA >60 02/11/2019 2336   GFRAA 107 06/13/2017 1512    BNP No results found for: BNP   Imaging:  No results found.  Administration History     None           No data to display          No results found for: NITRICOXIDE      Assessment & Plan:   Moderate obstructive sleep apnea Moderate OSA. Reviewed risks of untreated OSA and potential treatment options. Shared decision to retrial CPAP while working on healthy weight loss measures. We also  discussed hypoglossal nerve stimulator implant and oral appliance. Educated on proper use/care of CPAP. Risks/benefits reviewed. New start auto 5-20 cmH2O, mask of choice and heated humidity  order placed. Safe driving practices reviewed.  Patient Instructions  Start CPAP every night, minimum of 4-6 hours a night.  Change equipment as directed. Wash your tubing with warm soap and water  daily, hang to dry. Wash humidifier portion weekly. Use bottled, distilled water  and change daily Be aware of reduced alertness and do not drive or operate heavy machinery if experiencing this or drowsiness.  Exercise encouraged, as tolerated. Healthy weight management discussed.  Avoid or decrease alcohol consumption and medications that make you more sleepy, if possible. Notify if persistent daytime sleepiness occurs even with consistent use of PAP therapy.  Change CPAP supplies... Every month Mask cushions and/or nasal pillows CPAP machine filters Every 3 months Mask frame (not including the headgear) CPAP tubing Every 6 months Mask headgear Chin strap (if applicable) Humidifier water  tub   We discussed how untreated sleep apnea puts an individual at risk for cardiac arrhthymias, pulm HTN, DM, stroke and increases their risk for daytime accidents. We also briefly reviewed treatment options including weight loss, side sleeping position, oral appliance, CPAP therapy or referral to ENT for possible surgical options  Keep working on healthy weight loss measures   Follow up in 10-12 weeks with Dr. Jess to review CPAP, or sooner, if needed     Sleep paralysis Possibly due to untreated OSA. Will monitor for resolution with management.   Class 3 severe obesity with body mass index (BMI) of 40.0 to 44.9 in adult (HCC) BMI 40. Healthy weight loss encouraged   Advised if symptoms do not improve or worsen, to please contact office for sooner follow up or seek emergency care.   I spent 35 minutes of dedicated to the care of this patient on the date of this encounter to include pre-visit review of records, face-to-face time with the patient discussing conditions above, post visit ordering of  testing, clinical documentation with the electronic health record, making appropriate referrals as documented, and communicating necessary findings to members of the patients care team.  Comer LULLA Rouleau, NP 08/25/2024  Pt aware and understands NP's role.

## 2024-08-25 NOTE — Patient Instructions (Signed)
 Start CPAP every night, minimum of 4-6 hours a night.  Change equipment as directed. Wash your tubing with warm soap and water  daily, hang to dry. Wash humidifier portion weekly. Use bottled, distilled water  and change daily Be aware of reduced alertness and do not drive or operate heavy machinery if experiencing this or drowsiness.  Exercise encouraged, as tolerated. Healthy weight management discussed.  Avoid or decrease alcohol consumption and medications that make you more sleepy, if possible. Notify if persistent daytime sleepiness occurs even with consistent use of PAP therapy.  Change CPAP supplies... Every month Mask cushions and/or nasal pillows CPAP machine filters Every 3 months Mask frame (not including the headgear) CPAP tubing Every 6 months Mask headgear Chin strap (if applicable) Humidifier water  tub   We discussed how untreated sleep apnea puts an individual at risk for cardiac arrhthymias, pulm HTN, DM, stroke and increases their risk for daytime accidents. We also briefly reviewed treatment options including weight loss, side sleeping position, oral appliance, CPAP therapy or referral to ENT for possible surgical options  Keep working on healthy weight loss measures   Follow up in 10-12 weeks with Dr. Jess to review CPAP, or sooner, if needed

## 2024-08-25 NOTE — Assessment & Plan Note (Signed)
 BMI 40. Healthy weight loss encouraged

## 2024-09-10 ENCOUNTER — Other Ambulatory Visit: Payer: Self-pay

## 2024-09-10 NOTE — Patient Outreach (Signed)
 Complex Care Management   Visit Note  09/10/2024  Name:  Jamie Finley MRN: [id number] DOB: [birth year]  Situation: Referral received for Complex Care Management related to Diabetes with Complications I obtained verbal consent from Patient.  Visit completed with Patient  on the phone  Background:   Past Medical History:  Diagnosis Date   Arthritis    Depression    Diabetes mellitus without complication (HCC)    Frequent headaches    Gout    Hyperlipidemia    Hypertension    Stroke [facility])     Assessment: Patient Reported Symptoms:  Cognitive Cognitive Status: No symptoms reported Cognitive/Intellectual Conditions Management [RPT]: None reported or documented in medical history or problem list   Health Maintenance Behaviors: Annual physical exam, Healthy diet Health Facilitated by: Healthy diet  Neurological Neurological Review of Symptoms: Numbness, Headaches Neurological Comment: occasional headaches in morning, hydrates and cold towel helps, do not linger, poor sleep  HEENT HEENT Symptoms Reported: Not assessed HEENT Management Strategies: Routine screening    Cardiovascular Cardiovascular Symptoms Reported: No symptoms reported Does patient have uncontrolled Hypertension?: No Cardiovascular Management Strategies: Routine screening  Respiratory Respiratory Symptoms Reported: No symptoms reported Respiratory Management Strategies: Routine screening  Endocrine Endocrine Symptoms Reported: No symptoms reported Is patient diabetic?: Yes List most recent blood sugar readings, include date and time of day: not checking, has not felt high/lows, discussed diet and patient will discuss with Bariatrics    Gastrointestinal Gastrointestinal Symptoms Reported: Constipation Additional Gastrointestinal Details: reports decreased appetite, things seem boring, no taste for things, difficulty cooking so often eats out, discussed meal delivery options, will explore and discuss at  Bariatric appointment, managing constipation with Miralax  and Trulance  more regularly/before several days without BM Gastrointestinal Management Strategies: Medication therapy    Genitourinary Genitourinary Symptoms Reported: No symptoms reported Other Genitourinary Symptoms: back pain gone, no urinary issues    Integumentary Integumentary Symptoms Reported: No symptoms reported Other Integumentary Symptoms: saw dermatology, no areas of concern    Musculoskeletal          Psychosocial Additional Psychological Details: gets depressed over holidays - no fam friends here to spend  - describes being exhausted thinking about holidays and surrounded by reminders of holiday, continues to take meds and now feeling good since holidays over, meeting with counselor at [facility] 10/02/24          09/10/2024    PHQ2-9 Depression Screening   Little interest or pleasure in doing things    Feeling down, depressed, or hopeless    PHQ-2 - Total Score    Trouble falling or staying asleep, or sleeping too much    Feeling tired or having little energy    Poor appetite or overeating     Feeling bad about yourself - or that you are a failure or have let yourself or your family down    Trouble concentrating on things, such as reading the newspaper or watching television    Moving or speaking so slowly that other people could have noticed.  Or the opposite - being so fidgety or restless that you have been moving around a lot more than usual    Thoughts that you would be better off dead, or hurting yourself in some way    PHQ2-9 Total Score    If you checked off any problems, how difficult have these problems made it for you to do your work, take care of things at home, or get along with other people  Depression Interventions/Treatment      There were no vitals filed for this visit.    Medications Reviewed Today     Reviewed by Devra Lands, RN (Registered Nurse) on 09/10/24 at  1324  Med List Status: <None>   Medication Order Taking? Sig Documenting Provider Last Dose Status Informant  Accu-Chek Softclix Lancets lancets [id number] Yes Use to check fasting blood sugar daily Bedsole, Amy E, MD  Active   ammonium lactate  (AMLACTIN) 12 % lotion [id number] Yes Apply 1 Application topically as needed for dry skin. Tobie Franky SQUIBB, DPM  Active   aspirin  81 MG tablet [id number] Yes Take 81 mg by mouth daily. Maree Leni Edyth DELENA, MD  Active Self  blood glucose meter kit and supplies [id number] Yes Dispense based on patient and insurance preference. Use to check blood sugar two times a day. Bedsole, Amy E, MD  Active   Blood Glucose Monitoring Suppl (ACCU-CHEK GUIDE) w/Device KIT [id number] Yes Use to check blood fasting blood sugar daily Bedsole, Amy E, MD  Active   buPROPion  (WELLBUTRIN  XL) 150 MG 24 hr tablet [id number] Yes Take 1 tablet (150 mg total) by mouth daily. Bedsole, Amy E, MD  Active   Cholecalciferol (VITAMIN D3) 50 MCG (2000 UT) TABS [id number] Yes Take 5-7 tablets by mouth daily. [provider]  Active   colchicine  0.6 MG tablet [id number] Yes Take 1 tablet (0.6 mg total) by mouth daily as needed. Avelina Greig BRAVO, MD  Active   diclofenac  Sodium (VOLTAREN ) 1 % GEL [id number] Yes Apply 2 g topically 4 (four) times daily as needed. [provider]  Active Self  empagliflozin  (JARDIANCE ) 10 MG TABS tablet [id number] Yes Take 1 tablet (10 mg total) by mouth daily before breakfast. Avelina, Amy E, MD  Active   fluorometholone (FML) 0.1 % ophthalmic suspension [id number] Yes Place 1 drop into both eyes 4 (four) times daily. [provider]  Active            Med Note DORI, DAVINA E   Thu Jan 10, 2024 10:36 AM) Patient states he takes as needed.   glipiZIDE  (GLUCOTROL  XL) 10 MG 24 hr tablet [id number] Yes Take 1 tablet (10 mg total) by mouth daily with breakfast. Avelina, Amy E, MD  Active   glucose blood (ACCU-CHEK GUIDE) test strip [id number] Yes Use to check  blood fasting blood sugar daily Bedsole, Amy E, MD  Active   hydrochlorothiazide  (HYDRODIURIL ) 25 MG tablet [id number] Yes Take 1 tablet (25 mg total) by mouth daily. Avelina Greig BRAVO, MD  Active   HYDROcodone -acetaminophen  (NORCO/VICODIN) 5-325 MG tablet [id number] Yes Take 1 tablet by mouth every 6 (six) hours as needed for moderate pain (pain score 4-6) or severe pain (pain score 7-10). Avelina Greig BRAVO, MD  Active   metFORMIN  (GLUCOPHAGE -XR) 500 MG 24 hr tablet [id number] Yes Take 2 tablets (1,000 mg total) by mouth 2 (two) times daily with a meal. Bedsole, Amy E, MD  Active   methocarbamol  (ROBAXIN ) 750 MG tablet [id number] Yes Take 1 tablet (750 mg total) by mouth daily as needed for muscle spasms. Avelina Greig BRAVO, MD  Active   mupirocin  ointment (BACTROBAN ) 2 % [id number] Yes Apply 1 Application topically 2 (two) times daily. Avelina Greig BRAVO, MD  Active   Plecanatide  (TRULANCE ) 3 MG TABS [id number] Yes Take 1 tablet (3 mg total) by mouth daily. Jinny Carmine, MD  Active   polyethylene glycol (MIRALAX  / GLYCOLAX ) 17 g packet [id number] Yes Take 17 g by mouth  daily as needed. [provider]  Active   rosuvastatin  (CRESTOR ) 20 MG tablet [id number] Yes TAKE 1 TABLET AT BEDTIME Bedsole, Amy E, MD  Active   sildenafil  (REVATIO ) 20 MG tablet [id number]  Take 5 tablets prior to sexual activity  Patient not taking: Reported on 09/10/2024   Avelina Greig BRAVO, MD  Active            Med Note DORI, DAVINA E   Fri Dec 07, 2023  9:22 AM) Patient states not taking.             Recommendation:   PCP Follow-up Continue Current Plan of Care  Follow Up Plan:   Telephone follow-up in 1 month  Nestora Duos, MSN, RN [facility]  [facility], [facility] RN Care Manager Direct Dial: [phone] Fax: [phone]'

## 2024-09-10 NOTE — Patient Instructions (Signed)
 Visit Information  Thank you for taking time to visit with me today. Please don't hesitate to contact me if I can be of assistance to you before our next scheduled appointment.  Your next care management appointment is by telephone on 10/08/2024 at 1:30 pm  Telephone follow-up in 1 month  Please call the care guide team at [phone] if you need to cancel, schedule, or reschedule an appointment.   Please call the Suicide and Crisis Lifeline: 988 call the [place]  National Suicide Prevention Lifeline: 408-351-4886 or TTY: (626)303-9796 TTY 608 395 9614) to talk to a trained counselor call 1-800-273-TALK (toll free, 24 hour hotline) go to [facility] [address] [place] [phone]) call 911 if you are experiencing a Mental Health or Behavioral Health Crisis or need someone to talk to.  Nestora Duos, MSN, RN [facility], [facility] RN Care Manager Direct Dial: [phone] Fax: [phone]

## 2024-09-12 ENCOUNTER — Telehealth: Payer: Self-pay | Admitting: *Deleted

## 2024-09-12 ENCOUNTER — Telehealth: Payer: Self-pay

## 2024-09-12 DIAGNOSIS — E1169 Type 2 diabetes mellitus with other specified complication: Secondary | ICD-10-CM

## 2024-09-12 DIAGNOSIS — E1149 Type 2 diabetes mellitus with other diabetic neurological complication: Secondary | ICD-10-CM

## 2024-09-12 NOTE — Telephone Encounter (Signed)
-----   Message from Veva JINNY Ferrari sent at 09/12/2024 11:48 AM EST ----- Regarding: Lab orders for Tue. 1.13.26 Patient is scheduled for CPX labs, please order future labs, Thanks , Veva

## 2024-09-12 NOTE — Telephone Encounter (Signed)
 Faxed signed CMN for PAP supplies to [employer] at [phone].   Awaiting fax confirmation.

## 2024-09-12 NOTE — Telephone Encounter (Signed)
 Fax confirmation received, NFN

## 2024-09-15 ENCOUNTER — Encounter (INDEPENDENT_AMBULATORY_CARE_PROVIDER_SITE_OTHER): Payer: Self-pay | Admitting: Nurse Practitioner

## 2024-09-15 ENCOUNTER — Ambulatory Visit (INDEPENDENT_AMBULATORY_CARE_PROVIDER_SITE_OTHER): Admitting: Nurse Practitioner

## 2024-09-15 VITALS — BP 112/72 | HR 86 | Temp 97.8°F | Ht 70.5 in | Wt 299.8 lb

## 2024-09-15 DIAGNOSIS — Z7984 Long term (current) use of oral hypoglycemic drugs: Secondary | ICD-10-CM | POA: Diagnosis not present

## 2024-09-15 DIAGNOSIS — Z1331 Encounter for screening for depression: Secondary | ICD-10-CM | POA: Diagnosis not present

## 2024-09-15 DIAGNOSIS — E1169 Type 2 diabetes mellitus with other specified complication: Secondary | ICD-10-CM

## 2024-09-15 DIAGNOSIS — E1149 Type 2 diabetes mellitus with other diabetic neurological complication: Secondary | ICD-10-CM | POA: Diagnosis not present

## 2024-09-15 DIAGNOSIS — E1159 Type 2 diabetes mellitus with other circulatory complications: Secondary | ICD-10-CM

## 2024-09-15 DIAGNOSIS — E66813 Obesity, class 3: Secondary | ICD-10-CM

## 2024-09-15 DIAGNOSIS — Z6841 Body Mass Index (BMI) 40.0 and over, adult: Secondary | ICD-10-CM | POA: Diagnosis not present

## 2024-09-15 DIAGNOSIS — R0602 Shortness of breath: Secondary | ICD-10-CM

## 2024-09-15 DIAGNOSIS — E559 Vitamin D deficiency, unspecified: Secondary | ICD-10-CM | POA: Diagnosis not present

## 2024-09-15 DIAGNOSIS — R5383 Other fatigue: Secondary | ICD-10-CM | POA: Diagnosis not present

## 2024-09-15 DIAGNOSIS — I152 Hypertension secondary to endocrine disorders: Secondary | ICD-10-CM

## 2024-09-15 DIAGNOSIS — I10 Essential (primary) hypertension: Secondary | ICD-10-CM

## 2024-09-15 DIAGNOSIS — E785 Hyperlipidemia, unspecified: Secondary | ICD-10-CM | POA: Diagnosis not present

## 2024-09-15 DIAGNOSIS — G8220 Paraplegia, unspecified: Secondary | ICD-10-CM

## 2024-09-15 NOTE — Progress Notes (Signed)
 " [address] [place],  [place], [place] [address]  Office: [phone]  /  Fax: [phone]   Subjective   Initial Visit  Jamie Finley (MR# [id number]) is a 64 y.o. male who presents for evaluation and treatment of obesity and related comorbidities. Current BMI is Body mass index is 42.41 kg/m. Jamie Finley has been struggling with his weight for many years and has been unsuccessful in either losing weight, maintaining weight loss, or reaching his healthy weight goal.  Jamie Finley is currently in the action stage of change and ready to dedicate time achieving and maintaining a healthier weight. Jamie Finley is interested in becoming our patient and working on intensive lifestyle modifications including (but not limited to) diet and exercise for weight loss.  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 He is currently on Metformin  XR 500 mg 2 tabs BID, Glipizide  10 mg every day  and Jardiance  10 mg every day.  He tried Ozempic  and Rybelsus  in the past and had severe constipation(questionable gastroparesis) and is unable to take GLP1 medication.  Lab Results  Component Value Date   HGBA1C 6.6 (A) 06/27/2024    He does have hyperlipidemia and is currently on Rosuvastatin  20 mg at bedtime. Denies side effects to medication.  Lab Results  Component Value Date   CHOL 145 12/20/2023   HDL 45.70 12/20/2023   LDLCALC 65 12/20/2023   LDLDIRECT 81.0 05/11/2022   TRIG 171.0 (H) 12/20/2023   CHOLHDL 3 12/20/2023    Jamie Finley does have hypertension and his BP's are currently well controlled on hydrochlorothiazide  25 mg every day. Denies headaches, chest pain shortness of breath at rest and dizziness  BP Readings from Last 3 Encounters:  09/15/24 112/72  08/25/24 110/66  08/15/24 110/68    He did have a minor stroke in 2017 and had some speech/writing difficulties and short term memory loss. He did work on therapy on his own and writing/speech difficulties have  resolved.  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 He has very little sensation in his lower legs. He does have a history of gout and use colchicine  as needed.    He first noticed weight gain [place] Jamie Finley in 1992. He has a sedentary job as a horticulturist, commercial and noticed gradual weight gain throughout the years.  2009 lost weight after staph infection in legs that turned to sepsis, lots of nausea, vomiting- lost 20 pounds in the hospital then no appetite for about 9 months after.    When asked how their weight has affected their life and health, he states: Has affected self-esteem, Contributed to medical problems, Contributed to orthopedic problems or mobility issues, Having fatigue, Having poor endurance, Problems with eating patterns, and Has affected mood   When asked what else they would like to accomplish? He states: Adopt a healthier eating pattern and lifestyle, Improve energy levels and physical activity, Improve existing medical conditions, Improve quality of life, Improve appearance, Improve self-confidence, and Lose 25 lbs  He starting to note weight gain during : adulthood. When he moved back [place] the states from [place]. Jamie Finley in 1992 started to gain weight. 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   Life events associated with weight gain include : Move to [place] .   Other contributing factors: family history of obesity, disruption of circadian rhythm / sleep disordered breathing, consumption of processed foods, use of obesogenic medications: Other: pain medication- narcotic, reduced physical activity, chronic skipping of meals, slow metabolism for age, multiple weight loss attempts in the past, sedentary job, and  need for convenient foods.  Their highest weight has been:  355 lbs.  Desired weight: 225  Previous weight-loss programs : Weight Watchers and Atkins.  Their maximum weight loss was:  30-40 lbs.  Their greatest challenge with dieting: difficulty maintaining reduced calorie state and meal preparation and cooking.  Current or previous pharmacotherapy: GLP-1 and Metformin .  Response to medication: Had side effects so it was discontinued   Nutritional History:  Current nutrition plan: None.  How many times do you eat outside the home: > 7 per week  How often do they skip meals: skips different meals- can be breakfast, lunch or dinner  What beverages do they drink: water , caffeinated beverages , regular soda , juice, unsweetened tea, and milk.   Use of artificial sweetners : Yes  Food intolerances or dislikes: Brussel sprouts, egg plant and asparagus.  Food triggers: Boredom.  Food cravings: Sugary  Do they struggle with excessive hunger or portion control : Yes    Physical Activity:  Current level of physical activity: None  Barriers to Exercise: orthopedic problems   Past medical history includes:   Past Medical History:  Diagnosis Date   Arthritis    Constipation    Depression    Diabetes mellitus without complication (HCC)    Frequent headaches    Gout    Hyperlipidemia    Hypertension    OSA (obstructive sleep apnea)    SOB (shortness of breath) on exertion    Stroke (HCC)      Objective   BP 112/72   Pulse 86   Temp 97.8 F (36.6 C)   Ht 5' 10.5 (1.791 m)   Wt 299 lb 13.2 oz (136 kg)   SpO2 98%   BMI 42.41 kg/m  He was weighed on the bioimpedance scale: Body mass index is 42.41 kg/m.    Anthropometrics:  Vitals Temp: 97.8 F (36.6 C) BP: 112/72 Pulse Rate: 86 SpO2: 98 %   Anthropometric Measurements Height: 5' 10.5 (1.791 m) Weight: 299 lb 13.2 oz (136 kg) BMI (Calculated): 42.4 Weight at Last Visit: N/a Weight Lost Since  Last Visit: N/a Weight Gained Since Last Visit: N/a Starting Weight: 299lb Total Weight Loss (lbs): 0 lb (0 kg) Peak Weight: 350lb Waist Measurement : 54 inches   No data recorded Other Clinical Data RMR: 1944 Fasting: Yes Labs: Yes Today's Visit #: 1 Starting Date: 09/15/24 Comments: First visit/couldn't stay still on scale    Physical Exam:  General: He is overweight, cooperative, alert, well developed, and in no acute distress. PSYCH: Has normal mood, affect and thought process.   HEENT: EOMI, sclerae are anicteric. Lungs: Normal breathing effort, no conversational dyspnea. Extremities: No edema. Decreased ROM lower legs. Antalgic gait with rolling walker Neurologic: No gross sensory or motor deficits. No tremors or fasciculations noted.    Diagnostic Data Reviewed  EKG: Normal sinus rhythm, rate 65. No conduction abnormalities, abnormal Q waves or chamber enlargement.  Indirect Calorimeter completed today shows a VO2 of 282 and a REE of 1944.  His calculated basal metabolic rate is unable to get due to inability to  stand on the scale  Depression Screen  Jamie Finley's PHQ-9 score was: 16. He is scheduled to see therapist next week.  He is on Wellbutrin  and feels symptoms are improving. Denies suicidal ideation- no plan     09/15/2024   10:05 AM  Depression screen PHQ 2/9  Decreased Interest 1  Down, Depressed, Hopeless 2  PHQ - 2 Score 3  Altered sleeping 3  Tired, decreased energy 2  Change in appetite 3  Feeling bad or failure about yourself  2  Trouble concentrating 1  Moving slowly or fidgety/restless 0  Suicidal thoughts 2  PHQ-9 Score 16  Difficult doing work/chores Very difficult    Screening for Sleep Related Breathing Disorders  Jamie Finley admits to daytime somnolence and admits to waking up still tired. Patient has a history of symptoms of daytime fatigue, morning fatigue, morning headache, and hypertension. Jamie Finley generally gets 4 or 5 hours of sleep  per night, and states that he has difficulty falling asleep, nightime awakenings, and difficulty falling back asleep if awakened. Snoring is present. Apneic episodes are present. Epworth Sleepiness Score is 7.  He recently has a sleep study- CPAP recommended but has not yet received his machine  BMET    Component Value Date/Time   NA 139 12/20/2023 1012   NA 141 01/06/2015 0411   K 3.8 12/20/2023 1012   K 4.0 01/06/2015 0411   CL 99 12/20/2023 1012   CL 104 01/06/2015 0411   CO2 30 12/20/2023 1012   CO2 30 01/06/2015 0411   GLUCOSE 110 (H) 12/20/2023 1012   GLUCOSE 115 (H) 01/06/2015 0411   BUN 19 12/20/2023 1012   BUN 20 01/06/2015 0411   CREATININE 1.07 12/20/2023 1012   CREATININE 0.92 06/13/2017 1512   CALCIUM  9.2 12/20/2023 1012   CALCIUM  9.4 01/06/2015 0411   GFRNONAA >60 02/11/2022 1953   GFRNONAA 93 06/13/2017 1512   GFRAA >60 02/11/2019 2336   GFRAA 107 06/13/2017 1512   Lab Results  Component Value Date   HGBA1C 6.6 (A) 06/27/2024   HGBA1C 8.1 (H) 01/16/2016   No results found for: INSULIN  CBC    Component Value Date/Time   WBC 12.4 (H) 02/20/2024 1328   RBC 5.48 02/20/2024 1328   HGB 15.2 02/20/2024 1328   HGB 13.9 01/06/2015 0411   HCT 45.4 02/20/2024 1328   HCT 41.6 01/06/2015 0411   PLT 283 02/20/2024 1328   PLT 226 01/06/2015 0411   MCV 82.8 02/20/2024 1328   MCV 88 01/06/2015 0411   MCH 27.7 02/20/2024 1328   MCHC 33.5 02/20/2024 1328   RDW 14.4 02/20/2024 1328   RDW 13.9 01/06/2015 0411   Iron/TIBC/Ferritin/ %Sat No results found for: IRON, TIBC, FERRITIN, IRONPCTSAT Lipid Panel     Component Value Date/Time   CHOL 145 12/20/2023 1012   TRIG 171.0 (H) 12/20/2023 1012   HDL 45.70 12/20/2023 1012   CHOLHDL 3 12/20/2023 1012   VLDL 34.2 12/20/2023 1012   LDLCALC 65 12/20/2023 1012   LDLCALC 151 (H) 06/13/2017 1512   LDLDIRECT 81.0 05/11/2022 0926   Hepatic Function Panel     Component Value Date/Time   PROT 7.3 12/20/2023 1012    PROT 7.7 01/06/2015 0411   ALBUMIN 4.5 12/20/2023 1012   ALBUMIN 4.3 01/06/2015 0411   AST 18 12/20/2023 1012   AST 30 01/06/2015 0411   ALT 16 12/20/2023 1012   ALT 35 01/06/2015 0411   ALKPHOS 59 12/20/2023 1012   ALKPHOS 55 01/06/2015 0411  BILITOT 0.9 12/20/2023 1012   BILITOT 0.8 01/06/2015 0411   BILIDIR 0.1 06/20/2018 0842      Component Value Date/Time   TSH 6.680 (H) 08/28/2022 1447     Assessment and Plan  Class 3 severe obesity with serious comorbidity and body mass index (BMI) of 40.0 to 44.9 in adult, unspecified obesity type (HCC) TREATMENT PLAN FOR OBESITY:  Recommended Dietary Goals  Arlo is currently in the action stage of change. As such, his goal is to implement medically supervised obesity management plan.  He has agreed to implement: the Category 3 plan - 1500 kcal per day  Behavioral Intervention  We discussed the following Behavioral Modification Strategies today: increasing lean protein intake to established goals, decreasing simple carbohydrates , increasing vegetables, increasing lower glycemic fruits, increasing fiber rich foods, avoiding skipping meals, increasing water  intake, work on meal planning and preparation, reading food labels , keeping healthy foods at home, identifying sources and decreasing liquid calories, decreasing eating out or consumption of processed foods, and making healthy choices when eating convenient foods, planning for success, and better snacking choices  Additional resources provided today: Handout on healthy eating and balanced plate, Handout on complex carbohydrates and lean sources of protein, Category 3 packet, and Handout principles of weight management  Recommended Physical Activity Goals  Harun has been advised to work up to 150 minutes of moderate intensity aerobic activity a week and strengthening exercises 2-3 times per week for cardiovascular health, weight loss maintenance and preservation of muscle  mass.   He has agreed to :  Think about enjoyable ways to increase daily physical activity and overcoming barriers to exercise and Increase physical activity in their day and reduce sedentary time (increase NEAT).  Medical Interventions and Pharmacotherapy We will work on building a therapist, art and behavioral strategies. We will discuss the role of pharmacotherapy as an adjunct at subsequent visits.   ASSOCIATED CONDITIONS ADDRESSED TODAY  Other Fatigue Jamie Finley does feel that his weight is causing his energy to be lower than it should be. Fatigue may be related to obesity, depression or many other causes. Labs will be ordered, and in the meanwhile, Jamie Finley will focus on self care including making healthy food choices, increasing physical activity and focusing on stress reduction. - EKG 12 lead  Shortness of Breath on exertion Jamie Finley notes increasing shortness of breath with physical activity and seems to be worsening over time with weight gain. He notes getting out of breath sooner with activity than he used to. This has not gotten worse recently. Jamie Finley denies shortness of breath at rest or orthopnea.  Type 2 diabetes mellitus with neurological complications (HCC) Start Category 3  meal plan, limit simple carbohydrates Decreasing body weight by 10-15% can improve glucose levels Continue Metformin  XR 500 mg 2 tabs BID, denies side effects Continue to follow regularly with PCP -     CBC with Differential/Platelet -     Comprehensive metabolic panel with GFR -     Insulin , random -     Lipid panel  Hyperlipidemia associated with type 2 diabetes mellitus (HCC) Focus on implementing category 3 meal plan, limit saturated fats Loss of 10-15% body weight can improve lipid levels Continue Rosuvastatin  20 mg QHS -     Lipid panel  Hypertension associated with diabetes (HCC) Continue hydrochlorothiazide  25 mg every day Start Category 3 meal plan  and DASH  diet Monitor BP and if consistently >140/90 notify PCP If develops headaches, chest pain, shortness of  breath or dizziness go to ER Loss of 10-15% body weight can help improve blood pressures  -     CBC with Differential/Platelet -     Comprehensive metabolic panel with GFR  Paraparesis of both lower limbs (HCC)       Continue to use walker and leg brace with ambulation.        Continue to follow regularly with PCP  Depression screening       Scored a 16 on PHQ 9        Follow with behavioral health and is currently on Buproprion XL 150 mg every day and noticing improvement in symptoms.  He has appointment with a therapist next week.        Continue to monitor symptoms- denies suicidal ideation  Class 3 severe obesity with serious comorbidity and body mass index (BMI) of 40.0 to 44.9 in adult, unspecified obesity type (HCC) See plan above -     EKG 12-Lead -     CBC with Differential/Platelet -     Comprehensive metabolic panel with GFR -     Insulin , random -     Lipid panel -     Magnesium -     TSH -     Vitamin B12 -     VITAMIN D  25 Hydroxy (Vit-D Deficiency, Fractures)  Vitamin D  deficiency If Vit D level remains low will supplement with Ergocalciferol  50000 units once a week for 12 weeks and then recheck level.   -     VITAMIN D  25 Hydroxy (Vit-D Deficiency, Fractures)    Follow-up  He was informed of the importance of frequent follow-up visits to maximize his success with intensive lifestyle modifications for his multiple health conditions. He was informed we would discuss his lab results at his next visit unless there is a critical issue that needs to be addressed sooner. Jamie Finley agreed to keep his next visit at the agreed upon time to discuss these results.  Attestation Statement  This is the patient's intake visit at [facility]. The patient's Health Questionnaire was reviewed at length. Included in the packet: current and past health history,  medications, allergies, ROS, gynecologic history (women only), surgical history, family history, social history, weight history, weight loss surgery history (for those that have had weight loss surgery), nutritional evaluation, mood and food questionnaire, PHQ9, Epworth questionnaire, sleep habits questionnaire, patient life and health improvement goals questionnaire. These will all be scanned into the patient's chart under media.   During the visit, I independently reviewed the patient's EKG, previous labs, bioimpedance scale results, and indirect calorimetry results. I used this information to medically tailor a meal plan for the patient that will help him to lose weight and will improve his obesity-related conditions. I performed a medically necessary appropriate examination and/or evaluation. I discussed the assessment and treatment plan with the patient. The patient was provided an opportunity to ask questions and all were answered. The patient agreed with the plan and demonstrated an understanding of the instructions. Labs were ordered at this visit and will be reviewed at the next visit unless critical results need to be addressed immediately. Clinical information was updated and documented in the EMR.   In addition, they received basic education on identification of processed foods and reduction of these, different sources of lean proteins and complex carbohydrates and how to eat balanced by incorporation of whole foods.  Reviewed by clinician on day of visit: allergies, medications, problem list, medical history,  surgical history, family history, social history, and previous encounter notes.  I personally spent a total of 60 minutes in the care of the patient today including preparing to see the patient, getting/reviewing separately obtained history, performing a medically appropriate exam/evaluation, counseling and educating, placing orders, documenting clinical information in the EHR, and  independently interpreting results.  Does not include time for EKG, Indirect calorimetry and depression screening.   Lonell Liverpool ANP-C "

## 2024-09-16 ENCOUNTER — Ambulatory Visit (INDEPENDENT_AMBULATORY_CARE_PROVIDER_SITE_OTHER): Payer: Self-pay | Admitting: Nurse Practitioner

## 2024-09-16 LAB — TSH: TSH: 6.28 u[IU]/mL — ABNORMAL HIGH (ref 0.450–4.500)

## 2024-09-16 LAB — COMPREHENSIVE METABOLIC PANEL WITH GFR
ALT: 12 IU/L (ref 0–44)
AST: 19 IU/L (ref 0–40)
Albumin: 4.3 g/dL (ref 3.9–4.9)
Alkaline Phosphatase: 82 IU/L (ref 47–123)
BUN/Creatinine Ratio: 15 (ref 10–24)
BUN: 16 mg/dL (ref 8–27)
Bilirubin Total: 1 mg/dL (ref 0.0–1.2)
CO2: 25 mmol/L (ref 20–29)
Calcium: 9.7 mg/dL (ref 8.6–10.2)
Chloride: 98 mmol/L (ref 96–106)
Creatinine, Ser: 1.1 mg/dL (ref 0.76–1.27)
Globulin, Total: 3.1 g/dL (ref 1.5–4.5)
Glucose: 93 mg/dL (ref 70–99)
Potassium: 3.9 mmol/L (ref 3.5–5.2)
Sodium: 139 mmol/L (ref 134–144)
Total Protein: 7.4 g/dL (ref 6.0–8.5)
eGFR: 75 mL/min/1.73

## 2024-09-16 LAB — CBC WITH DIFFERENTIAL/PLATELET
Basophils Absolute: 0.1 x10E3/uL (ref 0.0–0.2)
Basos: 1 %
EOS (ABSOLUTE): 0.1 x10E3/uL (ref 0.0–0.4)
Eos: 2 %
Hematocrit: 41.8 % (ref 37.5–51.0)
Hemoglobin: 14.1 g/dL (ref 13.0–17.7)
Immature Grans (Abs): 0 x10E3/uL (ref 0.0–0.1)
Immature Granulocytes: 0 %
Lymphocytes Absolute: 2.1 x10E3/uL (ref 0.7–3.1)
Lymphs: 25 %
MCH: 28.7 pg (ref 26.6–33.0)
MCHC: 33.7 g/dL (ref 31.5–35.7)
MCV: 85 fL (ref 79–97)
Monocytes Absolute: 0.7 x10E3/uL (ref 0.1–0.9)
Monocytes: 9 %
Neutrophils Absolute: 5.4 x10E3/uL (ref 1.4–7.0)
Neutrophils: 63 %
Platelets: 296 x10E3/uL (ref 150–450)
RBC: 4.92 x10E6/uL (ref 4.14–5.80)
RDW: 14 % (ref 11.6–15.4)
WBC: 8.4 x10E3/uL (ref 3.4–10.8)

## 2024-09-16 LAB — LIPID PANEL
Chol/HDL Ratio: 3.2 ratio (ref 0.0–5.0)
Cholesterol, Total: 148 mg/dL (ref 100–199)
HDL: 46 mg/dL
LDL Chol Calc (NIH): 75 mg/dL (ref 0–99)
Triglycerides: 158 mg/dL — ABNORMAL HIGH (ref 0–149)
VLDL Cholesterol Cal: 27 mg/dL (ref 5–40)

## 2024-09-16 LAB — INSULIN, RANDOM: INSULIN: 12.8 u[IU]/mL (ref 2.6–24.9)

## 2024-09-16 LAB — VITAMIN D 25 HYDROXY (VIT D DEFICIENCY, FRACTURES): Vit D, 25-Hydroxy: 164.8 ng/mL — ABNORMAL HIGH (ref 30.0–100.0)

## 2024-09-16 LAB — MAGNESIUM: Magnesium: 1.9 mg/dL (ref 1.6–2.3)

## 2024-09-16 LAB — VITAMIN B12: Vitamin B-12: 375 pg/mL (ref 232–1245)

## 2024-09-22 ENCOUNTER — Encounter: Payer: Self-pay | Admitting: Dermatology

## 2024-09-22 ENCOUNTER — Ambulatory Visit: Admitting: Dermatology

## 2024-09-22 DIAGNOSIS — L918 Other hypertrophic disorders of the skin: Secondary | ICD-10-CM | POA: Diagnosis not present

## 2024-09-22 NOTE — Patient Instructions (Signed)
 Cryotherapy Aftercare  Wash gently with soap and water  everyday.   Apply Vaseline jelly daily until healed.      Due to recent changes in healthcare laws, you may see results of your pathology and/or laboratory studies on MyChart before the doctors have had a chance to review them. We understand that in some cases there may be results that are confusing or concerning to you. Please understand that not all results are received at the same time and often the doctors may need to interpret multiple results in order to provide you with the best plan of care or course of treatment. Therefore, we ask that you please give us  2 business days to thoroughly review all your results before contacting the office for clarification. Should we see a critical lab result, you will be contacted sooner.   If You Need Anything After Your Visit  If you have any questions or concerns for your doctor, please call our main line at [phone] and press option 4 to reach your doctor's medical assistant. If no one answers, please leave a voicemail as directed and we will return your call as soon as possible. Messages left after 4 pm will be answered the following business day.   You may also send us  a message via MyChart. We typically respond to MyChart messages within 1-2 business days.  For prescription refills, please ask your pharmacy to contact our office. Our fax number is [phone].  If you have an urgent issue when the clinic is closed that cannot wait until the next business day, you can page your doctor at the number below.    Please note that while we do our best to be available for urgent issues outside of office hours, we are not available 24/7.   If you have an urgent issue and are unable to reach us , you may choose to seek medical care at your doctor's office, retail clinic, urgent care center, or emergency room.  If you have a medical emergency, please immediately call 911 or go to the emergency  department.  Pager Numbers  - Dr. Hester: [phone]  - Dr. Jackquline: [phone]  - Dr. Claudene: [phone]   - Dr. Raymund: [phone]  In the event of inclement weather, please call our main line at [phone] for an update on the status of any delays or closures.  Dermatology Medication Tips: Please keep the boxes that topical medications come in in order to help keep track of the instructions about where and how to use these. Pharmacies typically print the medication instructions only on the boxes and not directly on the medication tubes.   If your medication is too expensive, please contact our office at [phone] option 4 or send us  a message through MyChart.   We are unable to tell what your co-pay for medications will be in advance as this is different depending on your insurance coverage. However, we may be able to find a substitute medication at lower cost or fill out paperwork to get insurance to cover a needed medication.   If a prior authorization is required to get your medication covered by your insurance company, please allow us  1-2 business days to complete this process.  Drug prices often vary depending on where the prescription is filled and some pharmacies may offer cheaper prices.  The website www.goodrx.com contains coupons for medications through different pharmacies. The prices here do not account for what the cost may be with help from insurance (it may be cheaper with  your insurance), but the website can give you the price if you did not use any insurance.  - You can print the associated coupon and take it with your prescription to the pharmacy.  - You may also stop by our office during regular business hours and pick up a GoodRx coupon card.  - If you need your prescription sent electronically to a different pharmacy, notify our office through [facility] or by phone at [phone] option 4.     Si Usted Necesita Algo Despus de Su  Visita  Tambin puede enviarnos un mensaje a travs de Clinical Cytogeneticist. Por lo general respondemos a los mensajes de MyChart en el transcurso de 1 a 2 das hbiles.  Para renovar recetas, por favor pida a su farmacia que se ponga en contacto con nuestra oficina. Randi lakes de fax es [place] [phone].  Si tiene un asunto urgente cuando la clnica est cerrada y que no puede esperar hasta el siguiente da hbil, puede llamar/localizar a su doctor(a) al nmero que aparece a continuacin.   Por favor, tenga en cuenta que aunque hacemos todo lo posible para estar disponibles para asuntos urgentes fuera del horario de [place], no estamos disponibles las 24 horas del da, los 7 [address]  [address] de la [place].   Si tiene un problema urgente y no puede comunicarse con nosotros, puede optar por buscar atencin mdica  en el consultorio de su doctor(a), en una clnica privada, en un centro de atencin urgente o en una sala de emergencias.  Si tiene engineer, drilling, por favor llame inmediatamente al 911 o vaya a la sala de emergencias.  Nmeros de bper  - Dr. Hester: [phone]  - Dra. Jackquline: [phone]  - Dr. Claudene: [phone]  - Dra. Kitts: [phone]  En caso de inclemencias del [place], por favor llame a nuestra lnea principal al [phone] para una actualizacin sobre el estado de cualquier retraso o cierre.  Consejos para la medicacin en dermatologa: Por favor, guarde las cajas en las que vienen los medicamentos de uso tpico para ayudarle a seguir las instrucciones sobre dnde y cmo usarlos. Las farmacias generalmente imprimen las instrucciones del medicamento slo en las cajas y no directamente en los tubos del [place].   Si su medicamento es muy caro, por favor, pngase en contacto con landry rieger llamando al [phone] y presione la opcin 4 o envenos un mensaje a travs de Clinical Cytogeneticist.   No podemos decirle cul ser su copago por los medicamentos por adelantado ya que esto  es diferente dependiendo de la cobertura de su seguro. Sin embargo, es posible que podamos encontrar un medicamento sustituto a audiological scientist un formulario para que el seguro cubra el medicamento que se considera necesario.   Si se requiere una autorizacin previa para que su compaa de seguros cubra su medicamento, por favor permtanos de 1 a 2 das hbiles para completar este proceso.  Los precios de los medicamentos varan con frecuencia dependiendo del environmental consultant de dnde se surte la receta y alguna farmacias pueden ofrecer precios ms baratos.  El sitio web www.goodrx.com tiene cupones para medicamentos de health and safety inspector. Los precios aqu no tienen en cuenta lo que podra costar con la ayuda del seguro (puede ser ms barato con su seguro), pero el sitio web puede darle el precio si no utiliz tourist information centre manager.  - Puede imprimir el cupn correspondiente y llevarlo con su receta a la farmacia.  - Tambin puede pasar por nuestra oficina durante el horario de atencin  regular y recoger una tarjeta de cupones de GoodRx.  - Si necesita que su receta se enve electrnicamente a una farmacia diferente, informe a nuestra oficina a travs de MyChart de [place] o por telfono llamando al [phone] y presione la opcin 4.

## 2024-09-22 NOTE — Progress Notes (Signed)
" ° °  Follow-Up Visit   Subjective  Jamie Finley is a 64 y.o. male who presents for the following: Skin tags to remove. Buttocks, inguinal creases, axillae. Rubbed, irritated.     The following portions of the chart were reviewed this encounter and updated as appropriate: medications, allergies, medical history  Review of Systems:  No other skin or systemic complaints except as noted in HPI or Assessment and Plan.  Objective  Well appearing patient in no apparent distress; mood and affect are within normal limits.  A focused examination was performed of the following areas: Axillae, groin, buttocks, abdomen  Relevant physical exam findings are noted in the Assessment and Plan.  Right Axilla x2, L mons x1, L lower abdomen x1, L buttock x1, R upper chest x1, L neck x2, L flank x1 (9) Fleshy, erythematous pedunculated papules.    Assessment & Plan   INFLAMED A[place]ON (9) Right Axilla x2, L mons x1, L lower abdomen x1, L buttock x1, R upper chest x1, L neck x2, L flank x1 (9) - Destruction of lesion - Right Axilla x2, L mons x1, L lower abdomen x1, L buttock x1, R upper chest x1, L neck x2, L flank x1 (9) Complexity: simple   Destruction method: cryotherapy   Informed consent: discussed and consent obtained   Timeout:  patient name, date of birth, surgical site, and procedure verified Lesion destroyed using liquid nitrogen: Yes   Region frozen until ice ball extended beyond lesion: Yes   Cryo cycles: 1 or 2. Outcome: patient tolerated procedure well with no complications   Post-procedure details: wound care instructions given   Additional details:  Prior to procedure, discussed risks of blister formation, small wound, skin dyspigmentation, or rare scar following cryotherapy. Recommend Vaseline ointment to treated areas while healing.     Return in 11 months (on 08/22/2025) for TBSE.  I, Kate Fought, CMA, am acting as scribe for Boneta Sharps, MD.   Documentation: I  have reviewed the above documentation for accuracy and completeness, and I agree with the above.  Boneta Sharps, MD    "

## 2024-09-23 ENCOUNTER — Other Ambulatory Visit

## 2024-09-23 DIAGNOSIS — E1149 Type 2 diabetes mellitus with other diabetic neurological complication: Secondary | ICD-10-CM | POA: Diagnosis not present

## 2024-09-23 LAB — HEMOGLOBIN A1C: Hgb A1c MFr Bld: 7.2 % — ABNORMAL HIGH (ref 4.6–6.5)

## 2024-09-29 ENCOUNTER — Encounter (INDEPENDENT_AMBULATORY_CARE_PROVIDER_SITE_OTHER): Payer: Self-pay | Admitting: Nurse Practitioner

## 2024-09-29 ENCOUNTER — Ambulatory Visit (INDEPENDENT_AMBULATORY_CARE_PROVIDER_SITE_OTHER): Admitting: Nurse Practitioner

## 2024-09-29 VITALS — BP 117/66 | HR 78 | Temp 97.6°F | Ht 70.5 in | Wt 286.0 lb

## 2024-09-29 DIAGNOSIS — R7989 Other specified abnormal findings of blood chemistry: Secondary | ICD-10-CM | POA: Diagnosis not present

## 2024-09-29 DIAGNOSIS — Z7984 Long term (current) use of oral hypoglycemic drugs: Secondary | ICD-10-CM | POA: Diagnosis not present

## 2024-09-29 DIAGNOSIS — E66813 Obesity, class 3: Secondary | ICD-10-CM

## 2024-09-29 DIAGNOSIS — Z6841 Body Mass Index (BMI) 40.0 and over, adult: Secondary | ICD-10-CM | POA: Diagnosis not present

## 2024-09-29 DIAGNOSIS — E785 Hyperlipidemia, unspecified: Secondary | ICD-10-CM

## 2024-09-29 DIAGNOSIS — I10 Essential (primary) hypertension: Secondary | ICD-10-CM | POA: Diagnosis not present

## 2024-09-29 DIAGNOSIS — I152 Hypertension secondary to endocrine disorders: Secondary | ICD-10-CM

## 2024-09-29 DIAGNOSIS — E1169 Type 2 diabetes mellitus with other specified complication: Secondary | ICD-10-CM

## 2024-09-29 DIAGNOSIS — E1159 Type 2 diabetes mellitus with other circulatory complications: Secondary | ICD-10-CM | POA: Diagnosis not present

## 2024-09-29 DIAGNOSIS — E1149 Type 2 diabetes mellitus with other diabetic neurological complication: Secondary | ICD-10-CM | POA: Diagnosis not present

## 2024-09-29 DIAGNOSIS — G8220 Paraplegia, unspecified: Secondary | ICD-10-CM | POA: Diagnosis not present

## 2024-09-29 NOTE — Progress Notes (Signed)
 " Office: [phone]  /  Fax: [phone]  WEIGHT SUMMARY AND BIOMETRICS  Weight Lost Since Last Visit: 13 lb  Weight Gained Since Last Visit: 0   Vitals Temp: 97.6 F (36.4 C) BP: 117/66 Pulse Rate: 78 SpO2: 97 %   Anthropometric Measurements Height: 5' 10.5 (1.791 m) Weight: 286 lb (129.7 kg) BMI (Calculated): 40.44 Weight at Last Visit: 299 lb Weight Lost Since Last Visit: 13 lb Weight Gained Since Last Visit: 0 Starting Weight: 299 lb Total Weight Loss (lbs): 13 lb (5.897 kg) Peak Weight: 350 lb Waist Measurement : 54 inches   Body Composition  Body Fat %: 44 % Fat Mass (lbs): 125.8 lbs Muscle Mass (lbs): 152.4 lbs Total Body Water  (lbs): 118.6 lbs Visceral Fat Rating : 28   Other Clinical Data Fasting: No Labs: no Today's Visit #: 2 Starting Date: 09/15/24    Total Weight Loss: 13 pounds Percent of body weight lost:4.3%  Bio Impedance Data reviewed with patient: No previous in body   HPI  Chief Complaint: OBESITY  Jamie Finley is here to discuss his progress with his obesity treatment plan. He is on the the Category 3 Plan and states he is following his eating plan approximately 50 % of the time. He states he is not currently exercising   Interval History:  Since last office visit he has been trying to get more protein throughout the day Breakfast: kind bar- 8 grams of protein Lunch: Lunch sandwich with 4 ounces of meat, 1 slice pepper jack cheese, spinach, italian vinagrette Dinner: Chicken or lean meat with vegetables He is drinking about 4 32 ounces in 6 cups of ice.   Jamie Finley does have hypertension and his BP's are currently well controlled on hydrochlorothiazide  25 mg every day. Denies headaches, chest pain, shortness of breath at rest and dizziness  BP Readings from Last 3 Encounters:  09/29/24 117/66  09/15/24 112/72  08/25/24 110/66     Lab results are reviewed with patient in detail.  Electrolytes, fasting glucose, kidney  functions, liver enzymes, Vit B12, blood count are all normal Lipid panel reveals mildly elevated triglycerides. Currently on Rosuvastatin  20 mg every day  Lab Results  Component Value Date   CHOL 148 09/15/2024   HDL 46 09/15/2024   LDLCALC 75 09/15/2024   LDLDIRECT 81.0 05/11/2022   TRIG 158 (H) 09/15/2024   CHOLHDL 3.2 09/15/2024    A1c is in diabetic range- currently on Metformin  XR 500 mg BID, Glipizide  10 mg QAM and Jardiance  10 mg every day. Fasting insulin  is elevated at 12.8 and HOMA-IR score of 2.93 Lab Results  Component Value Date   HGBA1C 7.2 (H) 09/23/2024    TSH is in hypothyroid range.  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 Decrease to 3 tablets a day  PHYSICAL EXAM:  Blood pressure 117/66, pulse 78, temperature 97.6 F (36.4 C), height 5' 10.5 (1.791 m), weight 286 lb (129.7 kg), SpO2 97%. Body mass index is 40.46 kg/m.  General: Well Developed, well nourished, and in no acute distress.  HEENT: Normocephalic, atraumatic; EOMI, sclerae are anicteric. Skin: Warm and dry, good turgor Chest:  Normal excursion, shape, no gross ABN Respiratory: No conversational dyspnea; speaking in full sentences NeuroM-Sk:  Normal gross ROM * 4 extremities  Psych: A and O X 3, insight adequate, mood- full    DIAGNOSTIC DATA REVIEWED:  BMET    Component Value Date/Time   NA 139 09/15/2024 1050   NA 141 01/06/2015 0411   K 3.9 09/15/2024 1050   K 4.0 01/06/2015 0411   CL 98 09/15/2024 1050   CL 104 01/06/2015 0411   CO2 25 09/15/2024 1050   CO2 30 01/06/2015 0411   GLUCOSE 93 09/15/2024 1050   GLUCOSE 110 (H) 12/20/2023 1012   GLUCOSE 115 (H) 01/06/2015 0411   BUN 16 09/15/2024 1050   BUN 20 01/06/2015 0411   CREATININE 1.10 09/15/2024 1050   CREATININE 0.92 06/13/2017 1512   CALCIUM  9.7 09/15/2024 1050   CALCIUM  9.4 01/06/2015 0411   GFRNONAA >60  02/11/2022 1953   GFRNONAA 93 06/13/2017 1512   GFRAA >60 02/11/2019 2336   GFRAA 107 06/13/2017 1512   Lab Results  Component Value Date   HGBA1C 7.2 (H) 09/23/2024   HGBA1C 8.1 (H) 01/16/2016   Lab Results  Component Value Date   INSULIN  12.8 09/15/2024   Lab Results  Component Value Date   TSH 6.280 (H) 09/15/2024   CBC    Component Value Date/Time   WBC 8.4 09/15/2024 1050   WBC 12.4 (H) 02/20/2024 1328   RBC 4.92 09/15/2024 1050   RBC 5.48 02/20/2024 1328   HGB 14.1 09/15/2024 1050   HCT 41.8 09/15/2024 1050   PLT 296 09/15/2024 1050   MCV 85 09/15/2024 1050   MCV 88 01/06/2015 0411   MCH 28.7 09/15/2024 1050   MCH 27.7 02/20/2024 1328   MCHC 33.7 09/15/2024 1050   MCHC 33.5 02/20/2024 1328   RDW 14.0 09/15/2024 1050   RDW 13.9 01/06/2015 0411   Iron Studies No results found for: IRON, TIBC, FERRITIN, IRONPCTSAT Lipid Panel     Component Value Date/Time   CHOL 148 09/15/2024 1050   TRIG 158 (H) 09/15/2024 1050   HDL 46 09/15/2024 1050   CHOLHDL 3.2 09/15/2024 1050   CHOLHDL 3 12/20/2023 1012   VLDL 34.2 12/20/2023 1012   LDLCALC 75 09/15/2024 1050   LDLCALC 151 (H) 06/13/2017 1512   LDLDIRECT 81.0 05/11/2022 0926   Hepatic Function Panel     Component Value Date/Time   PROT 7.4 09/15/2024 1050   PROT 7.7 01/06/2015 0411   ALBUMIN 4.3 09/15/2024 1050   ALBUMIN 4.3 01/06/2015 0411   AST 19 09/15/2024 1050   AST 30 01/06/2015 0411   ALT 12 09/15/2024 1050   ALT 35 01/06/2015 0411   ALKPHOS 82 09/15/2024 1050   ALKPHOS 55 01/06/2015 0411   BILITOT 1.0 09/15/2024 1050   BILITOT 0.8 01/06/2015 0411   BILIDIR 0.1 06/20/2018 0842      Component Value Date/Time   TSH 6.280 (H) 09/15/2024 1050   Nutritional Lab Results  Component Value Date   VD25OH 164.8 (H) 09/15/2024   VD25OH 63 07/17/2017   VD25OH 62 06/13/2017     ASSESSMENT AND PLAN Class 3 severe obesity with serious comorbidity and body mass index (BMI) of 40.0 to 44.9 in  adult, unspecified obesity type (HCC) TREATMENT PLAN FOR OBESITY:  Recommended Dietary Goals  Jamie Finley is currently in the action stage of change. As such, his goal is to continue weight management plan. He has agreed to the Category 3 Plan.  Behavioral Intervention  We discussed the following Behavioral Modification Strategies today: increasing lean protein intake to established goals, decreasing simple carbohydrates , increasing fiber rich foods, avoiding skipping meals, increasing water  intake , work on meal planning and preparation, reading food labels ,  better snacking choices, continue to work on maintaining a reduced calorie state, getting the recommended amount of protein, incorporating whole foods, making healthy choices, staying well hydrated and practicing mindfulness when eating., and increase protein intake, fibrous foods (25 grams per day for women, 30 grams for men) and water  to improve satiety and decrease hunger signals. .    Recommended Physical Activity Goals  Jamie Finley has been advised to work up to 150 minutes of moderate intensity aerobic activity a week and strengthening exercises 2-3 times per week for cardiovascular health, weight loss maintenance and preservation of muscle mass.   He has agreed to Patient will begin to exercise chair exercises through online - given website of [facility]   Pharmacotherapy We discussed various medication options to help Jamie Finley with his weight loss efforts and we both agreed to continue nutrition and behavior modification.  ASSOCIATED CONDITIONS ADDRESSED TODAY  Action/Plan  Elevated TSH Recheck TSH today, if remains elevated will start on low dose of Levothyroxine and recheck in 8 weeks -     TSH  Paraparesis of both lower limbs (HCC)       Continue to use wheelchair or rolling walker for ambulation       Continue to follow routinely with PCP       Start chair exercises as tolerated  Type 2 diabetes mellitus with  neurological complications (HCC) Continue Category 3  meal plan, limit simple carbohydrates. Increase lean protein, fiber and water .  Decreasing body weight by 10-15% can improve glucose levels Continue Metformin  XR 500 mg 2 tab BID, Glipizide  10 mg QAM and Jardiance  10 mg every day Continue to follow regularly with PCP  Hyperlipidemia associated with type 2 diabetes mellitus (HCC) Focus on implementing category 3 meal plan, limit saturated fats. Increase lean protein, fiber and water .  Continue Rosuvastatin  20 mg every day, denies side effects.  Start chair exercises- given website of [facility]  Hypertension associated with diabetes (HCC) Continue hydrochlorothiazide  25 mg  every day Continue Category 3 meal plan  and DASH diet Monitor BP and if consistently >140/90 notify PCP If develops headaches, chest pain, shortness of breath or dizziness go to ER           Return in about 3 weeks (around 10/20/2024).SABRA He was informed of the importance of frequent follow up visits to maximize his success with intensive lifestyle modifications for his multiple health conditions.   ATTESTASTION STATEMENTS:  Reviewed by clinician on day of visit: allergies, medications, problem list, medical history, surgical history, family history, social history, and previous encounter notes.   I personally spent a total of 42 minutes in the care of the patient today including preparing to see the patient, getting/reviewing separately obtained history, performing a medically appropriate exam/evaluation, counseling and educating, placing orders, and documenting clinical information in the EHR.   Lonell Liverpool ANP-C "

## 2024-09-29 NOTE — Patient Instructions (Signed)

## 2024-09-30 ENCOUNTER — Encounter: Admitting: Family Medicine

## 2024-09-30 ENCOUNTER — Ambulatory Visit (INDEPENDENT_AMBULATORY_CARE_PROVIDER_SITE_OTHER): Payer: Self-pay | Admitting: Nurse Practitioner

## 2024-09-30 LAB — TSH: TSH: 4.58 u[IU]/mL — ABNORMAL HIGH (ref 0.450–4.500)

## 2024-10-01 ENCOUNTER — Encounter: Payer: Self-pay | Admitting: Family Medicine

## 2024-10-01 MED ORDER — COLCHICINE 0.6 MG PO TABS: 0.6000 mg | ORAL_TABLET | Freq: Every day | ORAL | 2 refills | Status: AC | PRN

## 2024-10-02 ENCOUNTER — Ambulatory Visit: Payer: Self-pay | Admitting: Family Medicine

## 2024-10-02 ENCOUNTER — Ambulatory Visit (INDEPENDENT_AMBULATORY_CARE_PROVIDER_SITE_OTHER): Admitting: Clinical

## 2024-10-02 ENCOUNTER — Ambulatory Visit: Admitting: Family Medicine

## 2024-10-02 ENCOUNTER — Encounter: Payer: Self-pay | Admitting: Family Medicine

## 2024-10-02 VITALS — BP 100/66 | HR 70 | Temp 97.6°F | Ht 70.5 in | Wt 293.0 lb

## 2024-10-02 DIAGNOSIS — E1159 Type 2 diabetes mellitus with other circulatory complications: Secondary | ICD-10-CM

## 2024-10-02 DIAGNOSIS — F32A Depression, unspecified: Secondary | ICD-10-CM | POA: Diagnosis not present

## 2024-10-02 DIAGNOSIS — F331 Major depressive disorder, recurrent, moderate: Secondary | ICD-10-CM

## 2024-10-02 DIAGNOSIS — I10 Essential (primary) hypertension: Secondary | ICD-10-CM | POA: Diagnosis not present

## 2024-10-02 DIAGNOSIS — G8220 Paraplegia, unspecified: Secondary | ICD-10-CM

## 2024-10-02 DIAGNOSIS — H539 Unspecified visual disturbance: Secondary | ICD-10-CM | POA: Insufficient documentation

## 2024-10-02 DIAGNOSIS — G834 Cauda equina syndrome: Secondary | ICD-10-CM | POA: Diagnosis not present

## 2024-10-02 DIAGNOSIS — Z7984 Long term (current) use of oral hypoglycemic drugs: Secondary | ICD-10-CM | POA: Diagnosis not present

## 2024-10-02 DIAGNOSIS — E1149 Type 2 diabetes mellitus with other diabetic neurological complication: Secondary | ICD-10-CM

## 2024-10-02 DIAGNOSIS — Z0000 Encounter for general adult medical examination without abnormal findings: Secondary | ICD-10-CM

## 2024-10-02 DIAGNOSIS — Z125 Encounter for screening for malignant neoplasm of prostate: Secondary | ICD-10-CM

## 2024-10-02 DIAGNOSIS — R7989 Other specified abnormal findings of blood chemistry: Secondary | ICD-10-CM

## 2024-10-02 DIAGNOSIS — I152 Hypertension secondary to endocrine disorders: Secondary | ICD-10-CM

## 2024-10-02 DIAGNOSIS — E1169 Type 2 diabetes mellitus with other specified complication: Secondary | ICD-10-CM

## 2024-10-02 DIAGNOSIS — E785 Hyperlipidemia, unspecified: Secondary | ICD-10-CM | POA: Diagnosis not present

## 2024-10-02 DIAGNOSIS — G4733 Obstructive sleep apnea (adult) (pediatric): Secondary | ICD-10-CM

## 2024-10-02 DIAGNOSIS — E1143 Type 2 diabetes mellitus with diabetic autonomic (poly)neuropathy: Secondary | ICD-10-CM

## 2024-10-02 LAB — T4, FREE: Free T4: 0.7 ng/dL (ref 0.60–1.60)

## 2024-10-02 LAB — PSA, MEDICARE: PSA: 0.29 ng/mL (ref 0.10–4.00)

## 2024-10-02 LAB — T3, FREE: T3, Free: 3.1 pg/mL (ref 2.3–4.2)

## 2024-10-02 NOTE — Assessment & Plan Note (Signed)
 Chronic, Associated with diabetes.
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
## 2024-10-02 NOTE — Assessment & Plan Note (Addendum)
"   Good control on wellbutrin  Xl.. helping with motivation.   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 "

## 2024-10-02 NOTE — Assessment & Plan Note (Signed)
"   ACute..  Has noted several months of recent hallucinations of movement in peripheral vision.  Not associated with eyestrain.  No associated symptoms.  Normal neurologic exam today beyond baseline abnormalities. Recommended full eye exam. Follow-up with me if eye exam normal and symptoms continue "

## 2024-10-02 NOTE — Assessment & Plan Note (Signed)
Chronic, he has resulting paresis bilaterally

## 2024-10-02 NOTE — Assessment & Plan Note (Addendum)
 Planning to start CPAP. Working on weight loss

## 2024-10-02 NOTE — Assessment & Plan Note (Signed)
 Chronic,  limiting activity, but walking improved with bracing.

## 2024-10-02 NOTE — Assessment & Plan Note (Signed)
 Currently working on lifestyle changes, regular activity and healthy eating with weight loss center.

## 2024-10-02 NOTE — Progress Notes (Signed)
   Darice Seats, LCSW

## 2024-10-02 NOTE — Assessment & Plan Note (Signed)
 Chronic, improving control.  Now trying to be more active using rolling walker, continue to work on healthy low-carb diet. On maximum dose metformin  ER 1000 mg BID.  Back on glipizide  XL 10 mg p.o. daily with meal. Tolerating Jardiance  10 mg daily.  No current evidence of UTI or perineal infection. Given blood sugars trending down significantly will hold off on increasing dose given concern for bladder issues with history of neurogenic bladder... per pt request. Reevaluate A1c in 3 months.

## 2024-10-02 NOTE — Assessment & Plan Note (Signed)
Stable, chronic.  Continue current medication.   HCTZ 25 mg p.o. daily as needed swelling

## 2024-10-02 NOTE — Assessment & Plan Note (Signed)
Stable, chronic.  Continue current medication.   Crestor 20 mg p.o. daily 

## 2024-10-02 NOTE — Progress Notes (Signed)
 "   Patient ID: Jamie Finley, male    DOB: 1961/02/17, 64 y.o.   MRN: [id number]  This visit was conducted in person.  BP 100/66 (BP Location: Right Arm, Cuff Size: Large)   Pulse 70   Temp 97.6 F (36.4 C) (Oral)   Ht 5' 10.5 (1.791 m)   Wt 293 lb (132.9 kg)   SpO2 95%   BMI 41.45 kg/m    CC: Chief Complaint  Patient presents with   Medicare Wellness    No acute concerns    Subjective:   HPI: Jamie Finley is a 64 y.o. male presenting on 10/02/2024 for Medicare Wellness (No acute concerns)  The patient presents for annual medicare wellness, complete physical and review of chronic health problems. He/She also has the following acute concerns today:  Has been noting movement  in periphery off and on in last few week. NO trigger.  No associated symptoms.  I have personally reviewed the Medicare Annual Wellness questionnaire and have noted 1. The patient's medical and social history 2. Their use of alcohol, tobacco or illicit drugs 3. Their current medications and supplements 4. The patient's functional ability including ADL's, fall risks, home safety risks and hearing or visual             impairment. 5. Diet and physical activities 6. Evidence for depression or mood disorders 7.         Updated provider list Cognitive evaluation was performed and recorded on pt medicare questionnaire form. The patients weight, height, BMI and visual acuity have been recorded in the chart   I have made referrals, counseling and provided education to the patient based review of the above and I have provided the pt with a written personalized care plan for preventive services.   Documentation of this information was scanned into the electronic record under the media tab.   Advance directives and end of life planning reviewed in detail with patient and documented in EMR. Patient given handout on advance care directives if needed. HCPOA and living will updated if needed.  No falls in  last 12 months.  No results found.  Flowsheet Row Office Visit from 10/02/2024 in [facility] [place]  PHQ-2 Total Score 3   Hypertension:   Hydrochlorothiazide  25 mg daily BP Readings from Last 3 Encounters:  10/02/24 100/66  09/29/24 117/66  09/15/24 112/72  Using medication without problems or lightheadedness:  Chest pain with exertion: None Edema: Off and on Short of breath: Stable Average home BPs: Other issues:  Diabetes:  tolerable control. On maximum dose metformin  ER 1000 mg BID.  Back on glipizide  XL 10 mg p.o. daily with meal. Tolerating Jardiance  10 mg daily.  No current evidence of UTI or perineal infection. Lab Results  Component Value Date   HGBA1C 7.2 (H) 09/23/2024  Using medications without difficulties: Hypoglycemic episodes: None Hyperglycemic episodes: None Feet problems: Chronic peripheral neuropathy, no ulcers Blood Sugars averaging: eye exam within last year: Up-to-date   Elevated Cholesterol: Crestor  20 mg daily Lab Results  Component Value Date   CHOL 148 09/15/2024   HDL 46 09/15/2024   LDLCALC 75 09/15/2024   LDLDIRECT 81.0 05/11/2022   TRIG 158 (H) 09/15/2024   CHOLHDL 3.2 09/15/2024  Using medications without problems: Muscle aches:  Diet compliance: low carb diet Exercise: trying to be more active. Other complaints:  Morbid obesity  Wt Readings from Last 3 Encounters:  10/02/24 293 lb (132.9 kg)  09/29/24 286 lb (  129.7 kg)  09/15/24 299 lb 13.2 oz (136 kg)  Body mass index is 41.45 kg/m.       Relevant past medical, surgical, family and social history reviewed and updated as indicated. Interim medical history since our last visit reviewed. Allergies and medications reviewed and updated. Outpatient Medications Prior to Visit  Medication Sig Dispense Refill   aspirin  81 MG tablet Take 81 mg by mouth daily.     buPROPion  (WELLBUTRIN  XL) 150 MG 24 hr tablet Take 1 tablet (150 mg total) by mouth  daily. 90 tablet 3   Cholecalciferol (VITAMIN D3) 50 MCG (2000 UT) TABS Take 5-7 tablets by mouth daily.     colchicine  0.6 MG tablet Take 1 tablet (0.6 mg total) by mouth daily as needed. 30 tablet 2   diclofenac  Sodium (VOLTAREN ) 1 % GEL Apply 2 g topically 4 (four) times daily as needed.     empagliflozin  (JARDIANCE ) 10 MG TABS tablet Take 1 tablet (10 mg total) by mouth daily before breakfast. 30 tablet 11   fluorometholone (FML) 0.1 % ophthalmic suspension Place 1 drop into both eyes 4 (four) times daily.     glipiZIDE  (GLUCOTROL  XL) 10 MG 24 hr tablet Take 1 tablet (10 mg total) by mouth daily with breakfast. 90 tablet 3   glucose blood (ACCU-CHEK GUIDE) test strip Use to check blood fasting blood sugar daily 100 each 3   hydrochlorothiazide  (HYDRODIURIL ) 25 MG tablet Take 1 tablet (25 mg total) by mouth daily. 90 tablet 3   metFORMIN  (GLUCOPHAGE -XR) 500 MG 24 hr tablet Take 2 tablets (1,000 mg total) by mouth 2 (two) times daily with a meal. 360 tablet 3   Plecanatide  (TRULANCE ) 3 MG TABS Take 1 tablet (3 mg total) by mouth daily. 90 tablet 3   polyethylene glycol (MIRALAX  / GLYCOLAX ) 17 g packet Take 17 g by mouth daily as needed.     rosuvastatin  (CRESTOR ) 20 MG tablet TAKE 1 TABLET AT BEDTIME 90 tablet 1   blood glucose meter kit and supplies Dispense based on patient and insurance preference. Use to check blood sugar two times a day. (Patient not taking: Reported on 09/29/2024) 1 each 0   Blood Glucose Monitoring Suppl (ACCU-CHEK GUIDE) w/Device KIT Use to check blood fasting blood sugar daily (Patient not taking: Reported on 09/29/2024) 1 kit 0   Accu-Chek Softclix Lancets lancets Use to check fasting blood sugar daily (Patient not taking: Reported on 09/29/2024) 100 each 3   ammonium lactate  (AMLACTIN) 12 % lotion Apply 1 Application topically as needed for dry skin. (Patient not taking: Reported on 09/29/2024) 400 g 0   HYDROcodone -acetaminophen  (NORCO/VICODIN) 5-325 MG tablet Take 1  tablet by mouth every 6 (six) hours as needed for moderate pain (pain score 4-6) or severe pain (pain score 7-10). (Patient not taking: Reported on 09/29/2024) 20 tablet 0   methocarbamol  (ROBAXIN ) 750 MG tablet Take 1 tablet (750 mg total) by mouth daily as needed for muscle spasms. (Patient not taking: Reported on 09/29/2024) 30 tablet 1   mupirocin  ointment (BACTROBAN ) 2 % Apply 1 Application topically 2 (two) times daily. (Patient not taking: Reported on 09/29/2024) 15 g 0   sildenafil  (REVATIO ) 20 MG tablet Take 5 tablets prior to sexual activity (Patient not taking: Reported on 09/29/2024) 20 tablet 11   No facility-administered medications prior to visit.     Per HPI unless specifically indicated in ROS section below Review of Systems  Constitutional:  Negative for fatigue and fever.  HENT:  Negative for ear pain.   Eyes:  Negative for pain.  Respiratory:  Negative for cough and shortness of breath.   Cardiovascular:  Negative for chest pain, palpitations and leg swelling.  Gastrointestinal:  Negative for abdominal pain.  Genitourinary:  Negative for dysuria.  Musculoskeletal:  Negative for arthralgias.  Neurological:  Negative for syncope, light-headedness and headaches.  Psychiatric/Behavioral:  Negative for dysphoric mood.    Objective:  BP 100/66 (BP Location: Right Arm, Cuff Size: Large)   Pulse 70   Temp 97.6 F (36.4 C) (Oral)   Ht 5' 10.5 (1.791 m)   Wt 293 lb (132.9 kg)   SpO2 95%   BMI 41.45 kg/m   Wt Readings from Last 3 Encounters:  10/02/24 293 lb (132.9 kg)  09/29/24 286 lb (129.7 kg)  09/15/24 299 lb 13.2 oz (136 kg)      Physical Exam Constitutional:      Appearance: He is well-developed. He is obese.     Comments: Wheelchair bound  HENT:     Head: Normocephalic.     Right Ear: Hearing normal.     Left Ear: Hearing normal.     Nose: Nose normal.  Eyes:     Extraocular Movements: Extraocular movements intact.     Right eye: Normal extraocular motion.      Left eye: Normal extraocular motion.     Conjunctiva/sclera: Conjunctivae normal.     Pupils: Pupils are equal.     Funduscopic exam:    Right eye: No hemorrhage, exudate, AV nicking, arteriolar narrowing or papilledema.        Left eye: No hemorrhage, exudate, AV nicking, arteriolar narrowing or papilledema.  Neck:     Thyroid : No thyroid  mass or thyromegaly.     Vascular: No carotid bruit.     Trachea: Trachea normal.  Cardiovascular:     Rate and Rhythm: Normal rate and regular rhythm.     Pulses: Normal pulses.     Heart sounds: Heart sounds not distant. No murmur heard.    No friction rub. No gallop.     Comments: No peripheral edema Pulmonary:     Effort: Pulmonary effort is normal. No respiratory distress.     Breath sounds: Normal breath sounds.  Skin:    General: Skin is warm and dry.     Findings: No rash.  Neurological:     Mental Status: He is oriented to person, place, and time.     Cranial Nerves: Cranial nerves 2-12 are intact.     Sensory: Sensory deficit present.     Motor: Weakness and atrophy present.     Coordination: Coordination abnormal.     Gait: Gait abnormal.  Psychiatric:        Speech: Speech normal.        Behavior: Behavior normal.        Thought Content: Thought content normal.        Results for orders placed or performed in visit on 09/29/24  TSH   Collection Time: 09/29/24 11:54 AM  Result Value Ref Range   TSH 4.580 (H) 0.450 - 4.500 uIU/mL    This visit occurred during the SARS-CoV-2 public health emergency.  Safety protocols were in place, including screening questions prior to the visit, additional usage of staff PPE, and extensive cleaning of exam room while observing appropriate contact time as indicated for disinfecting solutions.   COVID 19 screen:  No recent travel or known exposure to COVID19 The patient denies respiratory symptoms  of COVID 19 at this time. The importance of social distancing was discussed today.    Assessment and Plan The patient's preventative maintenance and recommended screening tests for an annual wellness exam were reviewed in full today. Brought up to date unless services declined.  Counselled on the importance of diet, exercise, and its role in overall health and mortality. The patient's FH and SH was reviewed, including their home life, tobacco status, and drug and alcohol status.   PSA due for re-eval. Lab Results  Component Value Date   PSA 0.24 11/09/2021   PSA 0.32 06/10/2019   PSA 0.41 06/20/2018  Vaccine:  Consider  Tdap at pharmacy, refused flu. 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 Therisa.  Non smoker:  former smoker.. remote.   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  Continue current medication.   Crestor  20 mg p.o. daily      Hypertension associated with diabetes (HCC) (Chronic)   Stable, chronic.  Continue current medication.   HCTZ 25 mg p.o. daily as needed swelling      MDD (major depressive disorder), recurrent episode, moderate (HCC) (Chronic)    Good control on wellbutrin  Xl.. helping with motivation.   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      Moderate obstructive sleep apnea   Planning to start CPAP. Working on weight loss      Morbid obesity (HCC)   Currently working on lifestyle changes, regular activity and healthy eating with weight loss center.      Paraparesis of both lower limbs (HCC)   Chronic,  limiting activity, but walking improved with bracing.      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  Now trying to be more active using rolling walker, continue to work on healthy low-carb diet. On maximum dose metformin  ER 1000 mg BID.  Back on glipizide  XL 10 mg p.o. daily with meal. Tolerating Jardiance  10 mg daily.  No current evidence of UTI or perineal infection. Given blood sugars trending down significantly will hold off on increasing dose given concern for bladder issues with history of neurogenic bladder... per pt request. Reevaluate A1c in 3 months.        Visual changes   Other Visit Diagnoses       Abnormal TSH       Relevant Orders   T3, Free   T4, Free     Prostate cancer screening       Relevant Orders   PSA, Medicare           Greig Ring, MD   "

## 2024-10-03 ENCOUNTER — Encounter (INDEPENDENT_AMBULATORY_CARE_PROVIDER_SITE_OTHER): Payer: Self-pay | Admitting: Nurse Practitioner

## 2024-10-08 ENCOUNTER — Telehealth

## 2024-10-09 ENCOUNTER — Ambulatory Visit: Admitting: Clinical

## 2024-10-13 ENCOUNTER — Other Ambulatory Visit: Payer: Self-pay | Admitting: Family Medicine

## 2024-10-13 DIAGNOSIS — E785 Hyperlipidemia, unspecified: Secondary | ICD-10-CM

## 2024-10-16 ENCOUNTER — Telehealth

## 2024-10-20 ENCOUNTER — Ambulatory Visit (INDEPENDENT_AMBULATORY_CARE_PROVIDER_SITE_OTHER): Admitting: Nurse Practitioner

## 2024-10-20 ENCOUNTER — Ambulatory Visit: Admitting: Podiatry

## 2024-11-07 ENCOUNTER — Telehealth

## 2024-11-11 ENCOUNTER — Ambulatory Visit: Admitting: Clinical

## 2024-11-25 ENCOUNTER — Ambulatory Visit: Admitting: Clinical

## 2025-03-31 ENCOUNTER — Ambulatory Visit: Admitting: Family Medicine

## 2025-08-24 ENCOUNTER — Ambulatory Visit: Admitting: Dermatology
